# Patient Record
Sex: Male | Born: 1960 | Race: Black or African American | Hispanic: No | Smoking: Current every day smoker
Health system: Southern US, Community
[De-identification: ages and names within clinical notes are randomized; demographics above are authoritative.]

## PROBLEM LIST (undated history)

## (undated) DIAGNOSIS — K7460 Unspecified cirrhosis of liver: Secondary | ICD-10-CM

## (undated) DIAGNOSIS — B1920 Unspecified viral hepatitis C without hepatic coma: Secondary | ICD-10-CM

## (undated) DIAGNOSIS — R011 Cardiac murmur, unspecified: Secondary | ICD-10-CM

## (undated) DIAGNOSIS — F329 Major depressive disorder, single episode, unspecified: Secondary | ICD-10-CM

## (undated) DIAGNOSIS — E119 Type 2 diabetes mellitus without complications: Secondary | ICD-10-CM

## (undated) DIAGNOSIS — I10 Essential (primary) hypertension: Secondary | ICD-10-CM

## (undated) DIAGNOSIS — F32A Depression, unspecified: Secondary | ICD-10-CM

## (undated) HISTORY — DX: Depression, unspecified: F32.A

## (undated) HISTORY — DX: Major depressive disorder, single episode, unspecified: F32.9

---
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
## 2015-05-27 ENCOUNTER — Telehealth: Payer: Self-pay

## 2015-05-27 NOTE — Telephone Encounter (Signed)
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

## 2015-05-28 ENCOUNTER — Ambulatory Visit (INDEPENDENT_AMBULATORY_CARE_PROVIDER_SITE_OTHER): Payer: BLUE CROSS/BLUE SHIELD | Admitting: Internal Medicine

## 2015-05-28 ENCOUNTER — Encounter: Payer: Self-pay | Admitting: Internal Medicine

## 2015-05-28 VITALS — BP 140/86 | HR 52 | Temp 98.5°F | Ht 68.5 in | Wt 178.0 lb

## 2015-05-28 DIAGNOSIS — E119 Type 2 diabetes mellitus without complications: Secondary | ICD-10-CM | POA: Diagnosis not present

## 2015-05-28 DIAGNOSIS — I10 Essential (primary) hypertension: Secondary | ICD-10-CM | POA: Insufficient documentation

## 2015-05-28 DIAGNOSIS — E785 Hyperlipidemia, unspecified: Secondary | ICD-10-CM | POA: Diagnosis not present

## 2015-05-28 LAB — COMPREHENSIVE METABOLIC PANEL
ALT: 105 U/L — ABNORMAL HIGH (ref 0–53)
AST: 103 U/L — ABNORMAL HIGH (ref 0–37)
Albumin: 3.5 g/dL (ref 3.5–5.2)
Alkaline Phosphatase: 92 U/L (ref 39–117)
BUN: 13 mg/dL (ref 6–23)
CHLORIDE: 102 meq/L (ref 96–112)
CO2: 30 mEq/L (ref 19–32)
Calcium: 9.1 mg/dL (ref 8.4–10.5)
Creatinine, Ser: 0.94 mg/dL (ref 0.40–1.50)
GFR: 107.77 mL/min (ref >60.00)
Glucose, Bld: 78 mg/dL (ref 70–99)
Potassium: 4.3 mEq/L (ref 3.5–5.1)
SODIUM: 137 meq/L (ref 135–145)
Total Bilirubin: 1 mg/dL (ref 0.2–1.2)
Total Protein: 7.2 g/dL (ref 6.0–8.3)

## 2015-05-28 LAB — CBC
HCT: 44.2 % (ref 39.0–52.0)
Hemoglobin: 14.7 g/dL (ref 13.0–17.0)
MCHC: 33.2 g/dL (ref 30.0–36.0)
MCV: 92.4 fl (ref 78.0–100.0)
PLATELETS: 159 10*3/uL (ref 150.0–400.0)
RBC: 4.78 Mil/uL (ref 4.22–5.81)
RDW: 14.4 % (ref 11.5–15.5)
WBC: 4.1 10*3/uL (ref 4.0–10.5)

## 2015-05-28 LAB — LIPID PANEL
CHOL/HDL RATIO: 2
Cholesterol: 130 mg/dL (ref 0–200)
HDL: 54.1 mg/dL (ref >39.00)
LDL Cholesterol: 64 mg/dL (ref 0–99)
NonHDL: 75.9
Triglycerides: 60 mg/dL (ref 0.0–149.0)
VLDL: 12 mg/dL (ref 0.0–40.0)

## 2015-05-28 LAB — HEMOGLOBIN A1C: HEMOGLOBIN A1C: 6 % (ref 4.6–6.5)

## 2015-05-28 NOTE — Assessment & Plan Note (Addendum)
I think he is on Lisinopril more for renal protection secondary to DM2 Will continue to monitor BP at this time Will check CBC and CMET today Will get ECG at his next visit

## 2015-05-28 NOTE — Assessment & Plan Note (Signed)
Encouraged him to consume a low fat diet Will check CBC and CMET today Will continue Zocor at this time

## 2015-05-28 NOTE — Assessment & Plan Note (Signed)
Will check A1C today No microalbumin as he is on ACEI Continue Metformin and Januvia, if A1C < 6 consider cutting back Januvia Continue yearly eye exams Encouraged him to get a flu shot in the fall Will request records from previous PCP to see if he had a pneumonia vaccine

## 2015-05-28 NOTE — Patient Instructions (Signed)
Fat and Cholesterol Control Diet Fat and cholesterol levels in your blood and organs are influenced by your diet. High levels of fat and cholesterol may lead to diseases of the heart, small and large blood vessels, gallbladder, liver, and pancreas. CONTROLLING FAT AND CHOLESTEROL WITH DIET Although exercise and lifestyle factors are important, your diet is key. That is because certain foods are known to raise cholesterol and others to lower it. The goal is to balance foods for their effect on cholesterol and more importantly, to replace saturated and trans fat with other types of fat, such as monounsaturated fat, polyunsaturated fat, and omega-3 fatty acids. On average, a person should consume no more than 15 to 17 g of saturated fat daily. Saturated and trans fats are considered "bad" fats, and they will raise LDL cholesterol. Saturated fats are primarily found in animal products such as meats, butter, and cream. However, that does not mean you need to give up all your favorite foods. Today, there are good tasting, low-fat, low-cholesterol substitutes for most of the things you like to eat. Choose low-fat or nonfat alternatives. Choose round or loin cuts of red meat. These types of cuts are lowest in fat and cholesterol. Chicken (without the skin), fish, veal, and ground turkey breast are great choices. Eliminate fatty meats, such as hot dogs and salami. Even shellfish have little or no saturated fat. Have a 3 oz (85 g) portion when you eat lean meat, poultry, or fish. Trans fats are also called "partially hydrogenated oils." They are oils that have been scientifically manipulated so that they are solid at room temperature resulting in a longer shelf life and improved taste and texture of foods in which they are added. Trans fats are found in stick margarine, some tub margarines, cookies, crackers, and baked goods.  When baking and cooking, oils are a great substitute for butter. The monounsaturated oils are  especially beneficial since it is believed they lower LDL and raise HDL. The oils you should avoid entirely are saturated tropical oils, such as coconut and palm.  Remember to eat a lot from food groups that are naturally free of saturated and trans fat, including fish, fruit, vegetables, beans, grains (barley, rice, couscous, bulgur wheat), and pasta (without cream sauces).  IDENTIFYING FOODS THAT LOWER FAT AND CHOLESTEROL  Soluble fiber may lower your cholesterol. This type of fiber is found in fruits such as apples, vegetables such as broccoli, potatoes, and carrots, legumes such as beans, peas, and lentils, and grains such as barley. Foods fortified with plant sterols (phytosterol) may also lower cholesterol. You should eat at least 2 g per day of these foods for a cholesterol lowering effect.  Read package labels to identify low-saturated fats, trans fat free, and low-fat foods at the supermarket. Select cheeses that have only 2 to 3 g saturated fat per ounce. Use a heart-healthy tub margarine that is free of trans fats or partially hydrogenated oil. When buying baked goods (cookies, crackers), avoid partially hydrogenated oils. Breads and muffins should be made from whole grains (whole-wheat or whole oat flour, instead of "flour" or "enriched flour"). Buy non-creamy canned soups with reduced salt and no added fats.  FOOD PREPARATION TECHNIQUES  Never deep-fry. If you must fry, either stir-fry, which uses very little fat, or use non-stick cooking sprays. When possible, broil, bake, or roast meats, and steam vegetables. Instead of putting butter or margarine on vegetables, use lemon and herbs, applesauce, and cinnamon (for squash and sweet potatoes). Use nonfat   yogurt, salsa, and low-fat dressings for salads.  LOW-SATURATED FAT / LOW-FAT FOOD SUBSTITUTES Meats / Saturated Fat (g)  Avoid: Steak, marbled (3 oz/85 g) / 11 g  Choose: Steak, lean (3 oz/85 g) / 4 g  Avoid: Hamburger (3 oz/85 g) / 7  g  Choose: Hamburger, lean (3 oz/85 g) / 5 g  Avoid: Ham (3 oz/85 g) / 6 g  Choose: Ham, lean cut (3 oz/85 g) / 2.4 g  Avoid: Chicken, with skin, dark meat (3 oz/85 g) / 4 g  Choose: Chicken, skin removed, dark meat (3 oz/85 g) / 2 g  Avoid: Chicken, with skin, light meat (3 oz/85 g) / 2.5 g  Choose: Chicken, skin removed, light meat (3 oz/85 g) / 1 g Dairy / Saturated Fat (g)  Avoid: Whole milk (1 cup) / 5 g  Choose: Low-fat milk, 2% (1 cup) / 3 g  Choose: Low-fat milk, 1% (1 cup) / 1.5 g  Choose: Skim milk (1 cup) / 0.3 g  Avoid: Hard cheese (1 oz/28 g) / 6 g  Choose: Skim milk cheese (1 oz/28 g) / 2 to 3 g  Avoid: Cottage cheese, 4% fat (1 cup) / 6.5 g  Choose: Low-fat cottage cheese, 1% fat (1 cup) / 1.5 g  Avoid: Ice cream (1 cup) / 9 g  Choose: Sherbet (1 cup) / 2.5 g  Choose: Nonfat frozen yogurt (1 cup) / 0.3 g  Choose: Frozen fruit bar / trace  Avoid: Whipped cream (1 tbs) / 3.5 g  Choose: Nondairy whipped topping (1 tbs) / 1 g Condiments / Saturated Fat (g)  Avoid: Mayonnaise (1 tbs) / 2 g  Choose: Low-fat mayonnaise (1 tbs) / 1 g  Avoid: Butter (1 tbs) / 7 g  Choose: Extra light margarine (1 tbs) / 1 g  Avoid: Coconut oil (1 tbs) / 11.8 g  Choose: Olive oil (1 tbs) / 1.8 g  Choose: Corn oil (1 tbs) / 1.7 g  Choose: Safflower oil (1 tbs) / 1.2 g  Choose: Sunflower oil (1 tbs) / 1.4 g  Choose: Soybean oil (1 tbs) / 2.4 g  Choose: Canola oil (1 tbs) / 1 g Document Released: 10/26/2005 Document Revised: 02/20/2013 Document Reviewed: 01/24/2014 Exit[employer] Patient Information 2015 [employer]. This information is not intended to replace advice given to you by your health care provider. Make sure you discuss any questions you have with your health care provider.  

## 2015-05-28 NOTE — Progress Notes (Signed)
Pre visit review using our clinic review tool, if applicable. No additional management support is needed unless otherwise documented below in the visit note. 

## 2015-05-28 NOTE — Progress Notes (Signed)
HPI  Pt presents to the clinic today to establish care and for management of the conditions listed below. He is transferring care from Dr. Talmadge Coventry in [place].  DM2: He does test his sugars. They normally run around 100 fasting. He takes Metformin and Januvia as prescribed. He reports he was on insulin in the past, but was able to come off with diet and weight loss. His last eye exam was within the last year. His last flu shot was in 2014. He thinks he has had a pneumonia shot but is not positive.  HLD: He denies myalgias on Zocor. He does try to consume a low fat diet.  HTN: BP well controlled on Lisinopril. His BP today is 140/86.  Past Medical History  Diagnosis Date  . Diabetes mellitus without complication   . Hyperlipidemia     Current Outpatient Prescriptions  Medication Sig Dispense Refill  . lisinopril (PRINIVIL,ZESTRIL) 5 MG tablet Take 5 mg by mouth daily.    . metFORMIN (GLUCOPHAGE) 1000 MG tablet Take 1,000 mg by mouth 2 (two) times daily with a meal.    . simvastatin (ZOCOR) 40 MG tablet Take 40 mg by mouth daily.    . sitaGLIPtin (JANUVIA) 100 MG tablet Take 100 mg by mouth daily.     No current facility-administered medications for this visit.    No Known Allergies  Family History  Problem Relation Age of Onset  . Hyperlipidemia Mother   . Diabetes Mother   . Hyperlipidemia Father   . Diabetes Father     History   Social History  . Marital Status: Married    Spouse Name: N/A  . Number of Children: N/A  . Years of Education: N/A   Occupational History  . Not on file.   Social History Main Topics  . Smoking status: Current Every Day Smoker -- 0.50 packs/day    Types: Cigarettes  . Smokeless tobacco: Never Used  . Alcohol Use: 0.0 oz/week    0 Standard drinks or equivalent per week     Comment: weekend beer drinker  . Drug Use: No  . Sexual Activity: Not on file   Other Topics Concern  . Not on file   Social History Narrative  . No  narrative on file    ROS:  Constitutional: Denies fever, malaise, fatigue, headache or abrupt weight changes.  HEENT: Denies eye pain, eye redness, ear pain, ringing in the ears, wax buildup, runny nose, nasal congestion, bloody nose, or sore throat. Respiratory: Denies difficulty breathing, shortness of breath, cough or sputum production.   Cardiovascular: Denies chest pain, chest tightness, palpitations or swelling in the hands or feet.  Gastrointestinal: Denies abdominal pain, bloating, constipation, diarrhea or blood in the stool.  GU: Denies frequency, urgency, pain with urination, blood in urine, odor or discharge. Musculoskeletal: Denies decrease in range of motion, difficulty with gait, muscle pain or joint pain and swelling.  Skin: Denies redness, rashes, lesions or ulcercations.  Neurological: Denies dizziness, difficulty with memory, difficulty with speech or problems with balance and coordination.  Psych: Denies anxiety, depression, SI/HI.  No other specific complaints in a complete review of systems (except as listed in HPI above).  PE:  BP 140/86 mmHg  Pulse 52  Temp(Src) 98.5 F (36.9 C) (Oral)  Ht 5' 8.5" (1.74 m)  Wt 178 lb (80.74 kg)  BMI 26.67 kg/m2  SpO2 98% Wt Readings from Last 3 Encounters:  05/28/15 178 lb (80.74 kg)    General: Appears his  stated age, well developed, well nourished in NAD. HEENT: Head: normal shape and size; Eyes: sclera white, no icterus, conjunctiva pink, PERRLA and EOMs intact;  Neck: Neck supple, trachea midline. No masses, lumps or thyromegaly present.  Cardiovascular: Normal rate and rhythm. S1,S2 noted.  No murmur, rubs or gallops noted. No JVD or BLE edema. No carotid bruits noted. Pulmonary/Chest: Normal effort and positive vesicular breath sounds. No respiratory distress. No wheezes, rales or ronchi noted.  Abdomen: Soft and nontender. Normal bowel sounds, no bruits noted. No distention or masses noted. Liver, spleen and kidneys  non palpable. Neurological: Alert and oriented.  Psychiatric: Mood and affect normal. Behavior is normal. Judgment and thought content normal.     Assessment and Plan:

## 2015-05-30 ENCOUNTER — Telehealth: Payer: Self-pay | Admitting: Internal Medicine

## 2015-05-30 NOTE — Telephone Encounter (Signed)
Pt states someone called him but he doesn't know who.  He thinks maybe his labs are in now. Please call pt at home.  Thank you.

## 2015-05-31 NOTE — Addendum Note (Signed)
Addended by: Roena Malady on: 05/31/2015 09:40 AM   Modules accepted: Orders

## 2015-08-15 ENCOUNTER — Other Ambulatory Visit: Payer: Self-pay | Admitting: Internal Medicine

## 2015-09-21 LAB — HM DIABETES EYE EXAM

## 2015-10-17 ENCOUNTER — Encounter: Payer: Self-pay | Admitting: Internal Medicine

## 2015-12-12 ENCOUNTER — Ambulatory Visit: Payer: BLUE CROSS/BLUE SHIELD | Admitting: Internal Medicine

## 2015-12-13 ENCOUNTER — Telehealth: Payer: Self-pay | Admitting: Internal Medicine

## 2015-12-13 NOTE — Telephone Encounter (Signed)
Pt did not come in for their appt on 12/12/15 for follow up. Please let me know if pt needs to be contacted immediately for follow up or no follow up needed. Best phone number to contact pt is [phone].

## 2015-12-13 NOTE — Telephone Encounter (Signed)
Yes he needs 6 month followup

## 2015-12-16 NOTE — Telephone Encounter (Signed)
Left voicemail for pt to call back and reschedule appt.

## 2015-12-24 NOTE — Telephone Encounter (Signed)
Left message asking pt to call office  °

## 2015-12-25 ENCOUNTER — Encounter: Payer: Self-pay | Admitting: Internal Medicine

## 2015-12-25 NOTE — Telephone Encounter (Signed)
Left message asking pt to call and schedule appointment Also mailed letter

## 2016-08-14 ENCOUNTER — Emergency Department
Admission: EM | Admit: 2016-08-14 | Discharge: 2016-08-15 | Disposition: A | Discharge status: Home / Self Care | Payer: Self-pay | Attending: Emergency Medicine | Admitting: Emergency Medicine

## 2016-08-14 ENCOUNTER — Encounter: Payer: Self-pay | Admitting: Emergency Medicine

## 2016-08-14 ENCOUNTER — Telehealth: Payer: Self-pay | Admitting: Family Medicine

## 2016-08-14 ENCOUNTER — Encounter: Payer: Self-pay | Admitting: Internal Medicine

## 2016-08-14 ENCOUNTER — Ambulatory Visit (INDEPENDENT_AMBULATORY_CARE_PROVIDER_SITE_OTHER): Payer: Self-pay | Admitting: Internal Medicine

## 2016-08-14 ENCOUNTER — Telehealth: Payer: Self-pay | Admitting: *Deleted

## 2016-08-14 VITALS — BP 124/68 | HR 65 | Temp 98.6°F | Wt 177.0 lb

## 2016-08-14 DIAGNOSIS — E1165 Type 2 diabetes mellitus with hyperglycemia: Secondary | ICD-10-CM | POA: Insufficient documentation

## 2016-08-14 DIAGNOSIS — Z79899 Other long term (current) drug therapy: Secondary | ICD-10-CM | POA: Insufficient documentation

## 2016-08-14 DIAGNOSIS — E7800 Pure hypercholesterolemia, unspecified: Secondary | ICD-10-CM

## 2016-08-14 DIAGNOSIS — R739 Hyperglycemia, unspecified: Secondary | ICD-10-CM

## 2016-08-14 DIAGNOSIS — I10 Essential (primary) hypertension: Secondary | ICD-10-CM | POA: Insufficient documentation

## 2016-08-14 DIAGNOSIS — F17210 Nicotine dependence, cigarettes, uncomplicated: Secondary | ICD-10-CM | POA: Insufficient documentation

## 2016-08-14 DIAGNOSIS — E119 Type 2 diabetes mellitus without complications: Secondary | ICD-10-CM

## 2016-08-14 LAB — URINALYSIS COMPLETE WITH MICROSCOPIC (ARMC ONLY)
BILIRUBIN URINE: NEGATIVE
Bacteria, UA: NONE SEEN
Glucose, UA: >500 mg/dL — AB
Hgb urine dipstick: NEGATIVE
LEUKOCYTES UA: NEGATIVE
Nitrite: NEGATIVE
PH: 5 (ref 5.0–8.0)
PROTEIN: NEGATIVE mg/dL
RBC / HPF: NONE SEEN RBC/hpf (ref 0–5)
SPECIFIC GRAVITY, URINE: 1.03 (ref 1.005–1.030)
SQUAMOUS EPITHELIAL / LPF: NONE SEEN
WBC, UA: NONE SEEN WBC/hpf (ref 0–5)

## 2016-08-14 LAB — GLUCOSE, CAPILLARY
Glucose-Capillary: >600 mg/dL (ref 65–99)
Glucose-Capillary: 471 mg/dL — ABNORMAL HIGH (ref 65–99)
Glucose-Capillary: 368 mg/dL — ABNORMAL HIGH (ref 65–99)

## 2016-08-14 LAB — LIPID PANEL
CHOLESTEROL: 161 mg/dL (ref 0–200)
HDL: 19.7 mg/dL — AB (ref >39.00)
LDL Cholesterol: 102 mg/dL — ABNORMAL HIGH (ref 0–99)
NonHDL: 141.72
Total CHOL/HDL Ratio: 8
Triglycerides: 199 mg/dL — ABNORMAL HIGH (ref 0.0–149.0)
VLDL: 39.8 mg/dL (ref 0.0–40.0)

## 2016-08-14 LAB — BASIC METABOLIC PANEL
Anion gap: 10 (ref 5–15)
BUN: 11 mg/dL (ref 6–20)
CALCIUM: 9.2 mg/dL (ref 8.9–10.3)
CO2: 26 mmol/L (ref 22–32)
CREATININE: 1.06 mg/dL (ref 0.61–1.24)
Chloride: 90 mmol/L — ABNORMAL LOW (ref 101–111)
Glucose, Bld: 644 mg/dL (ref 65–99)
Potassium: 4.1 mmol/L (ref 3.5–5.1)
SODIUM: 126 mmol/L — AB (ref 135–145)

## 2016-08-14 LAB — HEMOGLOBIN A1C: HEMOGLOBIN A1C: 13.3 % — AB (ref 4.6–6.5)

## 2016-08-14 LAB — CBC
HCT: 44.8 % (ref 40.0–52.0)
Hemoglobin: 14.9 g/dL (ref 13.0–18.0)
MCH: 31 pg (ref 26.0–34.0)
MCHC: 33.2 g/dL (ref 32.0–36.0)
MCV: 93.6 fL (ref 80.0–100.0)
PLATELETS: 99 10*3/uL — AB (ref 150–440)
RBC: 4.79 MIL/uL (ref 4.40–5.90)
RDW: 13.4 % (ref 11.5–14.5)
WBC: 4.9 10*3/uL (ref 3.8–10.6)

## 2016-08-14 LAB — COMPREHENSIVE METABOLIC PANEL
ALBUMIN: 3 g/dL — AB (ref 3.5–5.2)
ALK PHOS: 245 U/L — AB (ref 39–117)
ALT: 130 U/L — AB (ref 0–53)
AST: 162 U/L — ABNORMAL HIGH (ref 0–37)
BILIRUBIN TOTAL: 1.1 mg/dL (ref 0.2–1.2)
BUN: 10 mg/dL (ref 6–23)
CO2: 23 mEq/L (ref 19–32)
Calcium: 9.2 mg/dL (ref 8.4–10.5)
Chloride: 89 mEq/L — ABNORMAL LOW (ref 96–112)
Creatinine, Ser: 1.05 mg/dL (ref 0.40–1.50)
GFR: 94.42 mL/min (ref >60.00)
Glucose, Bld: 773 mg/dL (ref 70–99)
POTASSIUM: 4 meq/L (ref 3.5–5.1)
Sodium: 124 mEq/L — ABNORMAL LOW (ref 135–145)
TOTAL PROTEIN: 8.1 g/dL (ref 6.0–8.3)

## 2016-08-14 MED ORDER — INSULIN ASPART 100 UNIT/ML ~~LOC~~ SOLN
10.0000 [IU] | Freq: Once | SUBCUTANEOUS | Status: AC
Start: 2016-08-14 — End: 2016-08-14
  Administered 2016-08-14: 10 [IU] via INTRAVENOUS
  Filled 2016-08-14: qty 10

## 2016-08-14 MED ORDER — SODIUM CHLORIDE 0.9 % IV BOLUS (SEPSIS)
1000.0000 mL | Freq: Once | INTRAVENOUS | Status: AC
  Administered 2016-08-14: 1000 mL via INTRAVENOUS

## 2016-08-14 MED ORDER — POTASSIUM CHLORIDE CRYS ER 20 MEQ PO TBCR
40.0000 meq | EXTENDED_RELEASE_TABLET | Freq: Once | ORAL | Status: AC
  Administered 2016-08-14: 40 meq via ORAL
  Filled 2016-08-14: qty 2

## 2016-08-14 NOTE — Telephone Encounter (Signed)
Mr. Cameron Coffey notified as instructed by telephone.  He states he has to wait until his finance gets home which should be in the next hour but he states he will go the [facility] ED.

## 2016-08-14 NOTE — Telephone Encounter (Signed)
Hope from [facility] called with critical lab. Glucose 773 when drawn today at 2pm. Results are in EPIC. Result given to Dr. Ermalene SearingBedsole since Nicki ReaperRegina Baity is not in the office.

## 2016-08-14 NOTE — Telephone Encounter (Signed)
Notify pt that his sugar is dangerously high at 773.Marland Kitchen. He needs to go to the ER to be treated ASAP

## 2016-08-14 NOTE — Progress Notes (Signed)
HPI  Pt presents to the clinic today for follow up of chronic conditions.  DM2: His last A1C was 6.0%. His sugars have run as high as 512. He takes Metformin but reports he can not afford his Januvia. He reports he was on insulin in the past, but was able to come off with diet and weight loss. His last eye exam was within the last year. His last flu shot was in 2014. He is not sure if he has had his pneumovax or not. Eye exam 05/2015  HLD: His last LDL was 68. He denies myalgias on Zocor. He does try to consume a low fat diet.  HTN: BP well controlled on Lisinopril. His BP today is 124/68. There is no ECG on file.  Past Medical History:  Diagnosis Date  . Diabetes mellitus without complication   . Hyperlipidemia     Current Outpatient Prescriptions  Medication Sig Dispense Refill  . lisinopril (PRINIVIL,ZESTRIL) 5 MG tablet Take 5 mg by mouth daily.    . metFORMIN (GLUCOPHAGE) 1000 MG tablet Take 1 tablet (1,000 mg total) by mouth 2 (two) times daily. MUST SCHEDULE LAB ONLY APPOINTMENT IT IS DUE NOW 180 tablet 0  . simvastatin (ZOCOR) 40 MG tablet Take 40 mg by mouth daily.    . sitaGLIPtin (JANUVIA) 100 MG tablet Take 100 mg by mouth daily.     No current facility-administered medications for this visit.     No Known Allergies  Family History  Problem Relation Age of Onset  . Hyperlipidemia Mother   . Diabetes Mother   . Hyperlipidemia Father   . Diabetes Father     Social History   Social History  . Marital status: Married    Spouse name: N/A  . Number of children: N/A  . Years of education: N/A   Occupational History  . Not on file.   Social History Main Topics  . Smoking status: Current Every Day Smoker    Packs/day: 0.50    Years: 10.00    Types: Cigarettes  . Smokeless tobacco: Never Used  . Alcohol use 0.0 oz/week     Comment: weekend beer drinker  . Drug use: No  . Sexual activity: No   Other Topics Concern  . Not on file   Social History Narrative   . No narrative on file    ROS:  Constitutional: Pt reports fatigue and weight loss. Denies fever, malaise, headache.  Respiratory: Denies difficulty breathing, shortness of breath, cough or sputum production.   Cardiovascular: Denies chest pain, chest tightness, palpitations or swelling in the hands or feet.  Gastrointestinal: Pt reports increased thirst. Denies abdominal pain, bloating, constipation, diarrhea or blood in the stool.  GU: Pt reports urinary frequency. Denies, urgency, pain with urination, blood in urine, odor or discharge. Skin: Denies redness, rashes, lesions or ulcercations.  Neurological: Denies dizziness, difficulty with memory, difficulty with speech or problems with balance and coordination.  Psych: Denies anxiety, depression, SI/HI.  No other specific complaints in a complete review of systems (except as listed in HPI above).  PE:  BP 124/68 (BP Location: Left Arm, Patient Position: Sitting, Cuff Size: Normal)   Pulse 65   Temp 98.6 F (37 C) (Oral)   Wt 177 lb (80.3 kg)   SpO2 98%   BMI 26.52 kg/m   Wt Readings from Last 3 Encounters:  05/28/15 178 lb (80.7 kg)    General: Appears his stated age, well developed, well nourished in NAD. Cardiovascular: Normal rate  and rhythm. S1,S2 noted. Murmur noted.  No JVD or BLE edema. No carotid bruits noted. Pulmonary/Chest: Normal effort and positive vesicular breath sounds. No respiratory distress. No wheezes, rales or ronchi noted.  Abdomen: Soft and nontender.  Neurological: Alert and oriented. Sensation intact to BLE. Psychiatric: Mood and affect normal. Behavior is normal. Judgment and thought content normal.     Assessment and Plan:

## 2016-08-14 NOTE — Patient Instructions (Signed)
Diabetes and Standards of Medical Care Diabetes is complicated. You may find that your diabetes team includes a dietitian, nurse, diabetes educator, eye doctor, and more. To help everyone know what is going on and to help you get the care you deserve, the following schedule of care was developed to help keep you on track. Below are the tests, exams, vaccines, medicines, education, and plans you will need. HbA1c test This test shows how well you have controlled your glucose over the past 2-3 months. It is used to see if your diabetes management plan needs to be adjusted.   It is performed at least 2 times a year if you are meeting treatment goals.  It is performed 4 times a year if therapy has changed or if you are not meeting treatment goals. Blood pressure test  This test is performed at every routine medical visit. The goal is less than 140/90 mm Hg for most people, but 130/80 mm Hg in some cases. Ask your health care provider about your goal. Dental exam  Follow up with the dentist regularly. Eye exam  If you are diagnosed with type 1 diabetes as a child, get an exam upon reaching the age of 80 years or older and having had diabetes for 3-5 years. Yearly eye exams are recommended after that initial eye exam.  If you are diagnosed with type 1 diabetes as an adult, get an exam within 5 years of diagnosis and then yearly.  If you are diagnosed with type 2 diabetes, get an exam as soon as possible after the diagnosis and then yearly. Foot care exam  Visual foot exams are performed at every routine medical visit. The exams check for cuts, injuries, or other problems with the feet.  You should have a complete foot exam performed every year. This exam includes an inspection of the structure and skin of your feet, a check of the pulses in your feet, and a check of the sensation in your feet.  Type 1 diabetes: The first exam is performed 5 years after diagnosis.  Type 2 diabetes: The first  exam is performed at the time of diagnosis.  Check your feet nightly for cuts, injuries, or other problems with your feet. Tell your health care provider if anything is not healing. Kidney function test (urine microalbumin)  This test is performed once a year.  Type 1 diabetes: The first test is performed 5 years after diagnosis.  Type 2 diabetes: The first test is performed at the time of diagnosis.  A serum creatinine and estimated glomerular filtration rate (eGFR) test is done once a year to assess the level of chronic kidney disease (CKD), if present. Lipid profile (cholesterol, HDL, LDL, triglycerides)  Performed every 5 years for most people.  The goal for LDL is less than 100 mg/dL. If you are at high risk, the goal is less than 70 mg/dL.  The goal for HDL is 40 mg/dL-50 mg/dL for men and 50 mg/dL-60 mg/dL for women. An HDL cholesterol of 60 mg/dL or higher gives some protection against heart disease.  The goal for triglycerides is less than 150 mg/dL. Immunizations  The flu (influenza) vaccine is recommended yearly for every person 30 months of age or older who has diabetes.  The pneumonia (pneumococcal) vaccine is recommended for every person 38 years of age or older who has diabetes. Adults 57 years of age or older may receive the pneumonia vaccine as a series of two separate shots.  The hepatitis B  vaccine is recommended for adults shortly after they have been diagnosed with diabetes.  The Tdap (tetanus, diphtheria, and pertussis) vaccine should be given:  According to normal childhood vaccination schedules, for children.  Every 10 years, for adults who have diabetes. Diabetes self-management education  Education is recommended at diagnosis and ongoing as needed. Treatment plan  Your treatment plan is reviewed at every medical visit.   This information is not intended to replace advice given to you by your health care provider. Make sure you discuss any questions you  have with your health care provider.   Document Released: 08/23/2009 Document Revised: 11/16/2014 Document Reviewed: 03/28/2013 Elsevier Interactive Patient Education 2016 [employer]  

## 2016-08-14 NOTE — ED Triage Notes (Signed)
Pt presents to ED from [facility] c/o elevated blood sugar level 773. On arrival pt is alert and oriented x4 with BS on arrival >600. Alert and oriented x4

## 2016-08-14 NOTE — Telephone Encounter (Signed)
See phone note

## 2016-08-15 MED ORDER — INSULIN ASPART 100 UNIT/ML ~~LOC~~ SOLN
SUBCUTANEOUS | 0 refills | Status: DC
Start: 2016-08-15 — End: 2016-08-17

## 2016-08-15 NOTE — ED Provider Notes (Signed)
[facility] Emergency Department Provider Note   ____________________________________________   First MD Initiated Contact with Patient 08/15/16 0006     (approximate)  I have reviewed the triage vital signs and the nursing notes.   HISTORY  Chief Complaint Hyperglycemia and Abnormal Lab    HPI Cameron Coffey is a 55 y.o. male sent to the ED from home by his PCP for critically high blood sugar. Patient is a type II diabetic, currently on metformin only. He lost his insurance 2 months ago and has not been able to afford Januvia which he should also be taking. He has been insulin-dependent in the past but was able to lose weight and come off insulin 4 years ago.States 3 weeks ago he worked in extreme heat and has been feeling bad ever since. He saw his doctor 2 days ago for regular checkup. He has had symptoms of polydipsia and polyuria as well as generalized fatigue. Had blood work done and he was called his PCP yesterday evening to come to the ER because his blood sugar was 773. Patient denies recent fever, chills, chest pain, shortness of breath, abdominal pain, nausea, vomiting, diarrhea. Denies recent travel or trauma. Nothing makes his symptoms better or worse.   Past Medical History:  Diagnosis Date  . Diabetes mellitus without complication (HCC)   . Hyperlipidemia     Patient Active Problem List   Diagnosis Date Noted  . Type 2 diabetes mellitus without complication (HCC) 05/28/2015  . HLD (hyperlipidemia) 05/28/2015  . Essential hypertension 05/28/2015    History reviewed. No pertinent surgical history.  Prior to Admission medications   Medication Sig Start Date End Date Taking? Authorizing Provider  lisinopril (PRINIVIL,ZESTRIL) 5 MG tablet Take 5 mg by mouth daily.   Yes Historical Provider, MD  metFORMIN (GLUCOPHAGE) 1000 MG tablet Take 1 tablet (1,000 mg total) by mouth 2 (two) times daily. MUST SCHEDULE LAB ONLY APPOINTMENT IT IS DUE NOW  08/16/15  Yes Lorre Munroe, NP  simvastatin (ZOCOR) 40 MG tablet Take 40 mg by mouth daily.   Yes Historical Provider, MD  insulin aspart (NOVOLOG) 100 UNIT/ML injection Use according to sliding scale instructions 08/15/16   Irean Hong, MD    Allergies Review of patient's allergies indicates no known allergies.  Family History  Problem Relation Age of Onset  . Hyperlipidemia Mother   . Diabetes Mother   . Hyperlipidemia Father   . Diabetes Father     Social History Social History  Substance Use Topics  . Smoking status: Current Every Day Smoker    Packs/day: 0.50    Years: 10.00    Types: Cigarettes  . Smokeless tobacco: Never Used  . Alcohol use 0.0 oz/week     Comment: weekend beer drinker    Review of Systems  Constitutional: Positive for polydipsia and polyuria. Positive for generalized malaise. No fever/chills. Eyes: No visual changes. ENT: No sore throat. Cardiovascular: Denies chest pain. Respiratory: Denies shortness of breath. Gastrointestinal: No abdominal pain.  No nausea, no vomiting.  No diarrhea.  No constipation. Genitourinary: Negative for dysuria. Musculoskeletal: Negative for back pain. Skin: Negative for rash. Neurological: Negative for headaches, focal weakness or numbness.  10-point ROS otherwise negative.  ____________________________________________   PHYSICAL EXAM:  VITAL SIGNS: ED Triage Vitals  Enc Vitals Group     BP 08/14/16 1818 139/81     Pulse Rate 08/14/16 1818 61     Resp 08/14/16 1818 16     Temp  08/14/16 1818 98.1 F (36.7 C)     Temp src --      SpO2 08/14/16 1818 100 %     Weight 08/14/16 1820 177 lb (80.3 kg)     Height 08/14/16 1820 5\' 9"  (1.753 m)     Head Circumference --      Peak Flow --      Pain Score --      Pain Loc --      Pain Edu? --      Excl. in GC? --     Constitutional: Alert and oriented. Well appearing and in no acute distress. Eyes: Conjunctivae are normal. PERRL. EOMI. Head:  Atraumatic. Nose: No congestion/rhinnorhea. Mouth/Throat: Mucous membranes are moist.  Oropharynx non-erythematous. Neck: No stridor.   Cardiovascular: Normal rate, regular rhythm. Grossly normal heart sounds.  Good peripheral circulation. Respiratory: Normal respiratory effort.  No retractions. Lungs CTAB. Gastrointestinal: Soft and nontender. No distention. No abdominal bruits. No CVA tenderness. Musculoskeletal: No lower extremity tenderness nor edema.  No joint effusions. Neurologic:  Normal speech and language. No gross focal neurologic deficits are appreciated. No gait instability. Skin:  Skin is warm, dry and intact. No rash noted. Psychiatric: Mood and affect are normal. Speech and behavior are normal.  ____________________________________________   LABS (all labs ordered are listed, but only abnormal results are displayed)  Labs Reviewed  GLUCOSE, CAPILLARY - Abnormal; Notable for the following:       Result Value   Glucose-Capillary >600 (*)    All other components within normal limits  BASIC METABOLIC PANEL - Abnormal; Notable for the following:    Sodium 126 (*)    Chloride 90 (*)    Glucose, Bld 644 (*)    All other components within normal limits  CBC - Abnormal; Notable for the following:    Platelets 99 (*)    All other components within normal limits  URINALYSIS COMPLETEWITH MICROSCOPIC ([facility] ONLY) - Abnormal; Notable for the following:    Color, Urine STRAW (*)    APPearance CLEAR (*)    Glucose, UA >500 (*)    Ketones, ur TRACE (*)    All other components within normal limits  GLUCOSE, CAPILLARY - Abnormal; Notable for the following:    Glucose-Capillary 471 (*)    All other components within normal limits  GLUCOSE, CAPILLARY - Abnormal; Notable for the following:    Glucose-Capillary 368 (*)    All other components within normal limits  CBG MONITORING, ED    ____________________________________________  EKG  None ____________________________________________  RADIOLOGY  None ____________________________________________   PROCEDURES  Procedure(s) performed: None  Procedures  Critical Care performed: No  ____________________________________________   INITIAL IMPRESSION / ASSESSMENT AND PLAN / ED COURSE  Pertinent labs & imaging results that were available during my care of the patient were reviewed by me and considered in my medical decision making (see chart for details).  55 year old male who is a type 2 diabetic sent to the ED by his PCP for abnormally elevated blood sugar done on blood work yesterday. Patient was treated prior to my arrival with 2 L of normal saline, insulin as well as potassium. Blood sugar currently is 368. Patient feels fine and is eager for discharge home. Given that it is the weekend, I have provided patient an instruction sheet for sliding scale insulin until he sees his doctor on Monday. Patient has a glucometer at home. He is financially strapped; we will provide patient with insulin and supplies to last  the weekend. Return precautions given. Patient and spouse verbalize understanding and agree with plan of care.  Clinical Course     ____________________________________________   FINAL CLINICAL IMPRESSION(S) / ED DIAGNOSES  Final diagnoses:  Hyperglycemia  Type 2 diabetes mellitus with hyperglycemia, without long-term current use of insulin (HCC)      NEW MEDICATIONS STARTED DURING THIS VISIT:  New Prescriptions   INSULIN ASPART (NOVOLOG) 100 UNIT/ML INJECTION    Use according to sliding scale instructions     Note:  This document was prepared using Dragon voice recognition software and may include unintentional dictation errors.    Irean HongJade J Brecklynn Jian, MD 08/15/16 0530

## 2016-08-15 NOTE — ED Notes (Signed)
Pt. Asking to go home. 

## 2016-08-15 NOTE — Discharge Instructions (Signed)
1. Continue metformin twice daily as directed by your doctor. 2. Check your blood sugar before meals and give insulin according to the sliding scale chart provided. 3. Return to the ER for worsening symptoms, persistent vomiting, difficulty breathing or other concerns.

## 2016-08-15 NOTE — ED Notes (Signed)
Pt. Requested IV removal and monitoring devices.  IV removed and leads removed.  Pt. Waiting for insulin from pharmacy.

## 2016-08-15 NOTE — ED Notes (Signed)
Pt. Going home with family. 

## 2016-08-15 NOTE — ED Notes (Signed)
Pt. Given instructions on how to give insulin injections.  Pt. States using insulin in the past.

## 2016-08-16 NOTE — Assessment & Plan Note (Signed)
Will check A1C today Continue Metformin Patient assistance form for Januvia completed No microalbumin secondary to ACEI Foot exam today He declines flu or pneumonia shot today  Will change therapy based on A1C

## 2016-08-16 NOTE — Assessment & Plan Note (Signed)
Controlled on Lisinopril CMET today 

## 2016-08-16 NOTE — Assessment & Plan Note (Signed)
Encouraged him to consume a low fat diet Continue Zocor Lipid Profile today

## 2016-08-17 MED ORDER — METFORMIN HCL 1000 MG PO TABS: 1000.0000 mg | ORAL_TABLET | Freq: Two times a day (BID) | ORAL | 2 refills | Status: DC

## 2016-08-17 MED ORDER — GLIPIZIDE 10 MG PO TABS: 10.0000 mg | ORAL_TABLET | Freq: Two times a day (BID) | ORAL | 2 refills | Status: DC

## 2016-08-17 NOTE — Addendum Note (Signed)
Addended by: Roena MaladyEVONTENNO, Tyreek Clabo Y on: 08/17/2016 05:27 PM   Modules accepted: Orders

## 2016-12-02 ENCOUNTER — Other Ambulatory Visit: Payer: Self-pay

## 2016-12-02 MED ORDER — METFORMIN HCL 1000 MG PO TABS: 1000.0000 mg | ORAL_TABLET | Freq: Two times a day (BID) | ORAL | 1 refills | Status: DC

## 2016-12-02 MED ORDER — GLIPIZIDE 10 MG PO TABS: 10.0000 mg | ORAL_TABLET | Freq: Two times a day (BID) | ORAL | 1 refills | Status: DC

## 2016-12-07 ENCOUNTER — Ambulatory Visit: Payer: Self-pay | Admitting: Internal Medicine

## 2016-12-07 NOTE — Progress Notes (Deleted)
   Subjective:    Patient ID: Cameron Coffey, male    DOB: 10/12/1961, 56 y.o.   MRN: [id number]  HPI  Pt presents to the clinic today for 3 month follow up of HLD and DM2. His last A1C was 13.3%, 08/2016. He had been noncompliant with diet and exercise. He is taking Metformin and Glipizide as prescribed. He reports at his last visit, that he could not afford Januvia. He is on Lisinopril for renal protection. His last LDL was 102, triglycerides 199, 08/2016. He is taking Zocor as prescribed. He denies myalgias. He has been consuming a low fat diet.  He is also here for a mole removal.  Review of Systems  Past Medical History:  Diagnosis Date  . Diabetes mellitus without complication (HCC)   . Hyperlipidemia     Current Outpatient Prescriptions  Medication Sig Dispense Refill  . glipiZIDE (GLUCOTROL) 10 MG tablet Take 1 tablet (10 mg total) by mouth 2 (two) times daily before a meal. 60 tablet 1  . lisinopril (PRINIVIL,ZESTRIL) 5 MG tablet Take 5 mg by mouth daily.    . metFORMIN (GLUCOPHAGE) 1000 MG tablet Take 1 tablet (1,000 mg total) by mouth 2 (two) times daily. 60 tablet 1  . simvastatin (ZOCOR) 40 MG tablet Take 40 mg by mouth daily.     No current facility-administered medications for this visit.     No Known Allergies  Family History  Problem Relation Age of Onset  . Hyperlipidemia Mother   . Diabetes Mother   . Hyperlipidemia Father   . Diabetes Father     Social History   Social History  . Marital status: Divorced    Spouse name: N/A  . Number of children: N/A  . Years of education: N/A   Occupational History  . Not on file.   Social History Main Topics  . Smoking status: Current Every Day Smoker    Packs/day: 0.50    Years: 10.00    Types: Cigarettes  . Smokeless tobacco: Never Used  . Alcohol use 0.0 oz/week     Comment: weekend beer drinker  . Drug use: No  . Sexual activity: No   Other Topics Concern  . Not on file   Social History Narrative   . No narrative on file     Constitutional: Denies fever, malaise, fatigue, headache or abrupt weight changes.  HEENT: Denies eye pain, eye redness, ear pain, ringing in the ears, wax buildup, runny nose, nasal congestion, bloody nose, or sore throat. Respiratory: Denies difficulty breathing, shortness of breath, cough or sputum production.   Cardiovascular: Denies chest pain, chest tightness, palpitations or swelling in the hands or feet.  Gastrointestinal: Denies abdominal pain, bloating, constipation, diarrhea or blood in the stool.  GU: Denies urgency, frequency, pain with urination, burning sensation, blood in urine, odor or discharge. Musculoskeletal: Denies decrease in range of motion, difficulty with gait, muscle pain or joint pain and swelling.  Skin: Denies redness, rashes, lesions or ulcercations.  Neurological: Denies dizziness, difficulty with memory, difficulty with speech or problems with balance and coordination.  Psych: Denies anxiety, depression, SI/HI.  No other specific complaints in a complete review of systems (except as listed in HPI above).     Objective:   Physical Exam        Assessment & Plan:

## 2016-12-23 ENCOUNTER — Other Ambulatory Visit: Payer: Self-pay

## 2016-12-23 MED ORDER — GLIPIZIDE 10 MG PO TABS: 10.0000 mg | ORAL_TABLET | Freq: Two times a day (BID) | ORAL | 0 refills | Status: DC

## 2016-12-23 MED ORDER — METFORMIN HCL 1000 MG PO TABS: 1000.0000 mg | ORAL_TABLET | Freq: Two times a day (BID) | ORAL | 0 refills | Status: DC

## 2017-01-27 ENCOUNTER — Other Ambulatory Visit: Payer: Self-pay

## 2017-01-27 MED ORDER — SIMVASTATIN 40 MG PO TABS: 40.0000 mg | ORAL_TABLET | Freq: Every day | ORAL | 0 refills | Status: DC

## 2017-01-27 MED ORDER — LISINOPRIL 5 MG PO TABS: 5.0000 mg | ORAL_TABLET | Freq: Every day | ORAL | 0 refills | Status: DC

## 2017-01-27 MED ORDER — METFORMIN HCL 1000 MG PO TABS: 1000.0000 mg | ORAL_TABLET | Freq: Two times a day (BID) | ORAL | 0 refills | Status: DC

## 2017-01-27 MED ORDER — GLIPIZIDE 10 MG PO TABS: 10.0000 mg | ORAL_TABLET | Freq: Two times a day (BID) | ORAL | 0 refills | Status: DC

## 2017-02-14 ENCOUNTER — Other Ambulatory Visit: Payer: Self-pay | Admitting: Internal Medicine

## 2017-02-15 ENCOUNTER — Ambulatory Visit: Payer: Self-pay | Admitting: Internal Medicine

## 2017-07-15 ENCOUNTER — Emergency Department: Payer: Self-pay

## 2017-07-15 ENCOUNTER — Other Ambulatory Visit: Payer: Self-pay

## 2017-07-15 ENCOUNTER — Emergency Department
Admission: EM | Admit: 2017-07-15 | Discharge: 2017-07-15 | Disposition: A | Discharge status: Home / Self Care | Payer: Self-pay | Attending: Student in an Organized Health Care Education/Training Program | Admitting: Student in an Organized Health Care Education/Training Program

## 2017-07-15 ENCOUNTER — Encounter: Payer: Self-pay | Admitting: Emergency Medicine

## 2017-07-15 DIAGNOSIS — I10 Essential (primary) hypertension: Secondary | ICD-10-CM | POA: Insufficient documentation

## 2017-07-15 DIAGNOSIS — Z7984 Long term (current) use of oral hypoglycemic drugs: Secondary | ICD-10-CM | POA: Insufficient documentation

## 2017-07-15 DIAGNOSIS — F17210 Nicotine dependence, cigarettes, uncomplicated: Secondary | ICD-10-CM | POA: Insufficient documentation

## 2017-07-15 DIAGNOSIS — Z79899 Other long term (current) drug therapy: Secondary | ICD-10-CM | POA: Insufficient documentation

## 2017-07-15 DIAGNOSIS — E1165 Type 2 diabetes mellitus with hyperglycemia: Secondary | ICD-10-CM | POA: Insufficient documentation

## 2017-07-15 DIAGNOSIS — E860 Dehydration: Secondary | ICD-10-CM | POA: Insufficient documentation

## 2017-07-15 DIAGNOSIS — R739 Hyperglycemia, unspecified: Secondary | ICD-10-CM

## 2017-07-15 LAB — COMPREHENSIVE METABOLIC PANEL
ALT: 126 U/L — AB (ref 17–63)
ANION GAP: 13 (ref 5–15)
AST: 179 U/L — ABNORMAL HIGH (ref 15–41)
Albumin: 2.8 g/dL — ABNORMAL LOW (ref 3.5–5.0)
Alkaline Phosphatase: 137 U/L — ABNORMAL HIGH (ref 38–126)
BUN: 13 mg/dL (ref 6–20)
CHLORIDE: 83 mmol/L — AB (ref 101–111)
CO2: 24 mmol/L (ref 22–32)
CREATININE: 0.94 mg/dL (ref 0.61–1.24)
Calcium: 9.1 mg/dL (ref 8.9–10.3)
Glucose, Bld: 802 mg/dL (ref 65–99)
Potassium: 5.1 mmol/L (ref 3.5–5.1)
Sodium: 120 mmol/L — ABNORMAL LOW (ref 135–145)
Total Bilirubin: 2.1 mg/dL — ABNORMAL HIGH (ref 0.3–1.2)
Total Protein: 8.5 g/dL — ABNORMAL HIGH (ref 6.5–8.1)

## 2017-07-15 LAB — BLOOD GAS, VENOUS
ACID-BASE EXCESS: 2 mmol/L (ref 0.0–2.0)
BICARBONATE: 26.5 mmol/L (ref 20.0–28.0)
O2 Saturation: 93.6 %
PATIENT TEMPERATURE: 37
PH VEN: 7.43 (ref 7.250–7.430)
pCO2, Ven: 40 mmHg — ABNORMAL LOW (ref 44.0–60.0)
pO2, Ven: 67 mmHg — ABNORMAL HIGH (ref 32.0–45.0)

## 2017-07-15 LAB — CBC WITH DIFFERENTIAL/PLATELET
Band Neutrophils: 0 %
Basophils Absolute: 0 10*3/uL (ref 0–0.1)
Basophils Relative: 0 %
Blasts: 0 %
EOS ABS: 0 10*3/uL (ref 0–0.7)
EOS PCT: 0 %
HCT: 46.8 % (ref 40.0–52.0)
HEMOGLOBIN: 15.5 g/dL (ref 13.0–18.0)
LYMPHS ABS: 1.1 10*3/uL (ref 1.0–3.6)
Lymphocytes Relative: 15 %
MCH: 31.1 pg (ref 26.0–34.0)
MCHC: 33.2 g/dL (ref 32.0–36.0)
MCV: 93.9 fL (ref 80.0–100.0)
MONO ABS: 1 10*3/uL (ref 0.2–1.0)
MYELOCYTES: 0 %
Metamyelocytes Relative: 0 %
Monocytes Relative: 14 %
NEUTROS PCT: 71 %
NRBC: 0 /100{WBCs}
Neutro Abs: 4.9 10*3/uL (ref 1.4–6.5)
Other: 0 %
PROMYELOCYTES ABS: 0 %
Platelets: 101 10*3/uL — ABNORMAL LOW (ref 150–440)
RBC: 4.99 MIL/uL (ref 4.40–5.90)
RDW: 13.9 % (ref 11.5–14.5)
Smear Review: ADEQUATE
WBC: 7 10*3/uL (ref 3.8–10.6)

## 2017-07-15 LAB — URINALYSIS, COMPLETE (UACMP) WITH MICROSCOPIC
BACTERIA UA: NONE SEEN
Bilirubin Urine: NEGATIVE
Glucose, UA: >=500 mg/dL — AB
Ketones, ur: 20 mg/dL — AB
NITRITE: NEGATIVE
PH: 6 (ref 5.0–8.0)
Protein, ur: NEGATIVE mg/dL
SPECIFIC GRAVITY, URINE: 1.028 (ref 1.005–1.030)

## 2017-07-15 LAB — GLUCOSE, CAPILLARY
GLUCOSE-CAPILLARY: 568 mg/dL — AB (ref 65–99)
GLUCOSE-CAPILLARY: 463 mg/dL — AB (ref 65–99)

## 2017-07-15 LAB — OSMOLALITY: OSMOLALITY: 304 mosm/kg — AB (ref 275–295)

## 2017-07-15 MED ORDER — INSULIN ASPART 100 UNIT/ML ~~LOC~~ SOLN
10.0000 [IU] | Freq: Once | SUBCUTANEOUS | Status: AC
  Administered 2017-07-15: 10 [IU] via INTRAVENOUS
  Filled 2017-07-15: qty 1

## 2017-07-15 MED ORDER — GLIPIZIDE 10 MG PO TABS: 10.0000 mg | ORAL_TABLET | Freq: Two times a day (BID) | ORAL | 0 refills | Status: DC

## 2017-07-15 MED ORDER — METFORMIN HCL 1000 MG PO TABS
1000.0000 mg | ORAL_TABLET | Freq: Two times a day (BID) | ORAL | 0 refills | Status: DC
Start: 2017-07-15 — End: 2017-08-18

## 2017-07-15 MED ORDER — SODIUM CHLORIDE 0.9 % IV BOLUS (SEPSIS)
1000.0000 mL | Freq: Once | INTRAVENOUS | Status: AC
  Administered 2017-07-15: 1000 mL via INTRAVENOUS

## 2017-07-15 MED ORDER — INSULIN DETEMIR 100 UNIT/ML ~~LOC~~ SOLN
15.0000 [IU] | Freq: Every day | SUBCUTANEOUS | Status: DC
  Administered 2017-07-15: 15 [IU] via SUBCUTANEOUS
  Filled 2017-07-15 (×2): qty 0.15

## 2017-07-15 NOTE — Care Management Note (Signed)
Case Management Note  Patient Details  Name: Cameron Coffey MRN: [id number] Date of Birth: 07/01/1961  Subjective/Objective:  Saw pt at bedside and spoke to him after getting permission to talk in front of family in room. The patient says he has no way to get the medication for his DM and has never been referred to Medication management Clinic . I [facility] , . The second was provided because the pt. And his family have questions about how he will be able to afford and pay for his care here in hospital.    At this time they have no further questions . The nurse for the pt. And the MD are made aware.              Action/Plan:   Expected Discharge Date:                  Expected Discharge Plan:     In-House Referral:     Discharge planning Services     Post Acute Care Choice:    Choice offered to:     DME Arranged:    DME Agency:     HH Arranged:    HH Agency:     Status of Service:     If discussed at [employer] Length of Stay Meetings, dates discussed:    Additional Comments:  Berna BueCheryl Auriana Scalia, RN 07/15/2017, 2:27 PM

## 2017-07-15 NOTE — ED Provider Notes (Signed)
[facility] Emergency Department Provider Note    First MD Initiated Contact with Patient 07/15/17 1402     (approximate)  I have reviewed the triage vital signs and the nursing notes.   HISTORY  Chief Complaint Weight Loss    HPI Cameron Coffey is a 56 y.o. male with a history of diabetes also with history of being noncompliant with his hyper extremity medications presents with roughly 30-40 pound weight loss over the past 2 months associated with polyuria and polydipsia. States is also feeling generalized fatigue. No fevers. No abdominal pain. No dysuria. No diarrhea. No shortness of breath. States he ran out of his medications roughly 2 months ago has not followed up with his PCP.   Past Medical History:  Diagnosis Date  . Diabetes mellitus without complication (HCC)   . Hyperlipidemia    Family History  Problem Relation Age of Onset  . Hyperlipidemia Mother   . Diabetes Mother   . Hyperlipidemia Father   . Diabetes Father    History reviewed. No pertinent surgical history. Patient Active Problem List   Diagnosis Date Noted  . Type 2 diabetes mellitus without complication (HCC) 05/28/2015  . HLD (hyperlipidemia) 05/28/2015  . Essential hypertension 05/28/2015      Prior to Admission medications   Medication Sig Start Date End Date Taking? Authorizing Provider  glipiZIDE (GLUCOTROL) 10 MG tablet Take 1 tablet (10 mg total) by mouth 2 (two) times daily before a meal. 02/15/17   Baity, Salvadore Oxford, NP  glipiZIDE (GLUCOTROL) 10 MG tablet Take 1 tablet (10 mg total) by mouth 2 (two) times daily before a meal. 07/15/17   Willy Eddy, MD  lisinopril (PRINIVIL,ZESTRIL) 5 MG tablet Take 1 tablet (5 mg total) by mouth daily. 01/27/17   Lorre Munroe, NP  metFORMIN (GLUCOPHAGE) 1000 MG tablet Take 1 tablet (1,000 mg total) by mouth 2 (two) times daily. 07/15/17   Willy Eddy, MD  simvastatin (ZOCOR) 40 MG tablet Take 1 tablet (40 mg total) by  mouth daily. 01/27/17   Lorre Munroe, NP    Allergies Patient has no known allergies.    Social History Social History  Substance Use Topics  . Smoking status: Current Every Day Smoker    Packs/day: 0.50    Years: 10.00    Types: Cigarettes  . Smokeless tobacco: Never Used  . Alcohol use 0.0 oz/week     Comment: weekend beer drinker    Review of Systems Patient denies headaches, rhinorrhea, blurry vision, numbness, shortness of breath, chest pain, edema, cough, abdominal pain, nausea, vomiting, diarrhea, dysuria, fevers, rashes or hallucinations unless otherwise stated above in HPI. ____________________________________________   PHYSICAL EXAM:  VITAL SIGNS: Vitals:   07/15/17 1600 07/15/17 1630  BP: (!) 123/92 128/83  Pulse: 70 68  Resp: 15 20  Temp:    SpO2: 100% 99%    Constitutional: Alert and oriented. in no acute distress. Eyes: Conjunctivae are normal.  Head: Atraumatic. Nose: No congestion/rhinnorhea. Mouth/Throat: Mucous membranes are dry Neck: No stridor. Painless ROM.  Cardiovascular: Normal rate, regular rhythm. Grossly normal heart sounds.  Good peripheral circulation. Respiratory: Normal respiratory effort.  No retractions. Lungs CTAB. Gastrointestinal: Soft and nontender. No distention. No abdominal bruits. No CVA tenderness. Musculoskeletal: No lower extremity tenderness nor edema.  No joint effusions. Neurologic:  Normal speech and language. No gross focal neurologic deficits are appreciated. No facial droop Skin:  Skin is warm, dry and intact. No rash noted. Psychiatric: Mood and  affect are normal. Speech and behavior are normal.  ____________________________________________   LABS (all labs ordered are listed, but only abnormal results are displayed)  Results for orders placed or performed during the hospital encounter of 07/15/17 (from the past 24 hour(s))  Comprehensive metabolic panel     Status: Abnormal   Collection Time: 07/15/17   1:15 PM  Result Value Ref Range   Sodium 120 (L) 135 - 145 mmol/L   Potassium 5.1 3.5 - 5.1 mmol/L   Chloride 83 (L) 101 - 111 mmol/L   CO2 24 22 - 32 mmol/L   Glucose, Bld 802 (HH) 65 - 99 mg/dL   BUN 13 6 - 20 mg/dL   Creatinine, Ser 1.610.94 0.61 - 1.24 mg/dL   Calcium 9.1 8.9 - 09.610.3 mg/dL   Total Protein 8.5 (H) 6.5 - 8.1 g/dL   Albumin 2.8 (L) 3.5 - 5.0 g/dL   AST 045179 (H) 15 - 41 U/L   ALT 126 (H) 17 - 63 U/L   Alkaline Phosphatase 137 (H) 38 - 126 U/L   Total Bilirubin 2.1 (H) 0.3 - 1.2 mg/dL   GFR calc non Af Amer >60 >60 mL/min   GFR calc Af Amer >60 >60 mL/min   Anion gap 13 5 - 15  CBC with Differential     Status: Abnormal   Collection Time: 07/15/17  1:15 PM  Result Value Ref Range   WBC 7.0 3.8 - 10.6 K/uL   RBC 4.99 4.40 - 5.90 MIL/uL   Hemoglobin 15.5 13.0 - 18.0 g/dL   HCT 40.946.8 81.140.0 - 91.452.0 %   MCV 93.9 80.0 - 100.0 fL   MCH 31.1 26.0 - 34.0 pg   MCHC 33.2 32.0 - 36.0 g/dL   RDW 78.213.9 95.611.5 - 21.314.5 %   Platelets 101 (L) 150 - 440 K/uL   Neutrophils Relative % 71 %   Lymphocytes Relative 15 %   Monocytes Relative 14 %   Eosinophils Relative 0 %   Basophils Relative 0 %   Band Neutrophils 0 %   Metamyelocytes Relative 0 %   Myelocytes 0 %   Promyelocytes Absolute 0 %   Blasts 0 %   nRBC 0 0 /100 WBC   Other 0 %   Neutro Abs 4.9 1.4 - 6.5 K/uL   Lymphs Abs 1.1 1.0 - 3.6 K/uL   Monocytes Absolute 1.0 0.2 - 1.0 K/uL   Eosinophils Absolute 0.0 0 - 0.7 K/uL   Basophils Absolute 0.0 0 - 0.1 K/uL   Smear Review      PLATELET CLUMPS NOTED ON SMEAR, COUNT APPEARS ADEQUATE  Blood gas, venous     Status: Abnormal   Collection Time: 07/15/17  2:02 PM  Result Value Ref Range   pH, Ven 7.43 7.250 - 7.430   pCO2, Ven 40 (L) 44.0 - 60.0 mmHg   pO2, Ven 67.0 (H) 32.0 - 45.0 mmHg   Bicarbonate 26.5 20.0 - 28.0 mmol/L   Acid-Base Excess 2.0 0.0 - 2.0 mmol/L   O2 Saturation 93.6 %   Patient temperature 37.0    Collection site VEIN    Sample type VEIN   Urinalysis,  Complete w Microscopic     Status: Abnormal   Collection Time: 07/15/17  2:02 PM  Result Value Ref Range   Color, Urine STRAW (A) YELLOW   APPearance CLEAR (A) CLEAR   Specific Gravity, Urine 1.028 1.005 - 1.030   pH 6.0 5.0 - 8.0   Glucose, UA >=500 (A)  NEGATIVE mg/dL   Hgb urine dipstick SMALL (A) NEGATIVE   Bilirubin Urine NEGATIVE NEGATIVE   Ketones, ur 20 (A) NEGATIVE mg/dL   Protein, ur NEGATIVE NEGATIVE mg/dL   Nitrite NEGATIVE NEGATIVE   Leukocytes, UA TRACE (A) NEGATIVE   RBC / HPF 0-5 0 - 5 RBC/hpf   WBC, UA TOO NUMEROUS TO COUNT 0 - 5 WBC/hpf   Bacteria, UA NONE SEEN NONE SEEN   Squamous Epithelial / LPF 0-5 (A) NONE SEEN   Mucus PRESENT   Osmolality     Status: Abnormal   Collection Time: 07/15/17  2:40 PM  Result Value Ref Range   Osmolality 304 (H) 275 - 295 mOsm/kg  Glucose, capillary     Status: Abnormal   Collection Time: 07/15/17  3:29 PM  Result Value Ref Range   Glucose-Capillary 568 (HH) 65 - 99 mg/dL  Glucose, capillary     Status: Abnormal   Collection Time: 07/15/17  5:44 PM  Result Value Ref Range   Glucose-Capillary 463 (H) 65 - 99 mg/dL   ____________________________________________  EKG My review and personal interpretation at Time: 13:58   Indication: weakness  Rate: 70  Rhythm: sinus Axis: normal Other: no stemi, no depressions, normal intervals ____________________________________________  RADIOLOGY  I personally reviewed all radiographic images ordered to evaluate for the above acute complaints and reviewed radiology reports and findings.  These findings were personally discussed with the patient.  Please see medical record for radiology report.  ____________________________________________   PROCEDURES  Procedure(s) performed:  Procedures    Critical Care performed: no ____________________________________________   INITIAL IMPRESSION / ASSESSMENT AND PLAN / ED COURSE  Pertinent labs & imaging results that were available  during my care of the patient were reviewed by me and considered in my medical decision making (see chart for details).  DDX: dehydration, dka, hhns, hyperglycemia, medication non compliance  Cameron Coffey is a 56 y.o. who presents to the ED with 2 months weight loss polyuria and polydipsia. This is secondary to medication non-compliance. Well except the above differential shows evidence of marked hyperglycemia but no evidence of DKA. Mild dehydration. Clinically he does not have any evidence of hyperosmolar nonketotic syndrome. Will order also qualities to evaluate for any subclinical severe dehydration or hyperosmolar state. We will start an initial treatment with IV fluids as well as IV insulin.  Clinical Course as of Jul 15 1910  Thu Jul 15, 2017  1731 Osmolality is just above normal level. Glucose is appropriately coming down. Patient remains hemodynamically stable. Will give additional loss of fluid and reassessed.  [PR]  1757 repeat glucose 473.  She is tolerating oral hydration. Is in no acute distress. Discussed option for observation hospital for continued IV fluids the patient would prefer to follow up in outpatient clinic. We'll give refill for all his medications. based on his clinical presentation do believe this is reasonable. We'll also give referral to diabetes clinic.  Have discussed with the patient and available family all diagnostics and treatments performed thus far and all questions were answered to the best of my ability. The patient demonstrates understanding and agreement with plan.   [PR]    Clinical Course User Index [PR] Willy Eddy, MD     ____________________________________________   FINAL CLINICAL IMPRESSION(S) / ED DIAGNOSES  Final diagnoses:  Chronic hyperglycemia  Dehydration      NEW MEDICATIONS STARTED DURING THIS VISIT:  Discharge Medication List as of 07/15/2017  6:03 PM  Note:  This document was prepared using Dragon voice  recognition software and may include unintentional dictation errors.    Willy Eddy, MD 07/15/17 [phone]

## 2017-07-15 NOTE — ED Notes (Signed)
Pt returned from xray via wheelchair

## 2017-07-15 NOTE — ED Notes (Signed)
Case management at bedside.

## 2017-07-15 NOTE — ED Notes (Signed)
Pt taken to xray via wheelchair

## 2017-07-15 NOTE — ED Notes (Addendum)
Pt reports weight loss- 35lbs in 2 months. Pt states he feels tired. Alert and oriented. Denies CP.

## 2017-07-15 NOTE — ED Triage Notes (Signed)
Patient presents to ED via POV from home with c/o weight loss. Patient states he has lost over 35 pounds in the past two months. Patient denies N/V/D. Denies CP or SOB.

## 2017-07-15 NOTE — ED Notes (Signed)
Spoke with Dr. Roxan Hockeyobinson in regards to patients presentation. See orders. Verbal order to send patient to flex.

## 2017-07-24 ENCOUNTER — Encounter: Payer: Self-pay | Admitting: Emergency Medicine

## 2017-07-24 ENCOUNTER — Other Ambulatory Visit: Payer: Self-pay

## 2017-07-24 ENCOUNTER — Emergency Department: Payer: Self-pay

## 2017-07-24 ENCOUNTER — Inpatient Hospital Stay
Admission: EM | Admit: 2017-07-24 | Discharge: 2017-07-29 | DRG: 853 | Disposition: A | Discharge status: Skilled Nursing Facility | Payer: Self-pay | Attending: Internal Medicine | Admitting: Internal Medicine

## 2017-07-24 DIAGNOSIS — I10 Essential (primary) hypertension: Secondary | ICD-10-CM | POA: Diagnosis present

## 2017-07-24 DIAGNOSIS — Z590 Homelessness: Secondary | ICD-10-CM

## 2017-07-24 DIAGNOSIS — A419 Sepsis, unspecified organism: Secondary | ICD-10-CM | POA: Diagnosis present

## 2017-07-24 DIAGNOSIS — R339 Retention of urine, unspecified: Secondary | ICD-10-CM

## 2017-07-24 DIAGNOSIS — R748 Abnormal levels of other serum enzymes: Secondary | ICD-10-CM

## 2017-07-24 DIAGNOSIS — J90 Pleural effusion, not elsewhere classified: Secondary | ICD-10-CM | POA: Diagnosis present

## 2017-07-24 DIAGNOSIS — J189 Pneumonia, unspecified organism: Secondary | ICD-10-CM | POA: Diagnosis not present

## 2017-07-24 DIAGNOSIS — N412 Abscess of prostate: Secondary | ICD-10-CM | POA: Diagnosis present

## 2017-07-24 DIAGNOSIS — R739 Hyperglycemia, unspecified: Secondary | ICD-10-CM

## 2017-07-24 DIAGNOSIS — Z7984 Long term (current) use of oral hypoglycemic drugs: Secondary | ICD-10-CM

## 2017-07-24 DIAGNOSIS — Z9114 Patient's other noncompliance with medication regimen: Secondary | ICD-10-CM

## 2017-07-24 DIAGNOSIS — E872 Acidosis: Secondary | ICD-10-CM | POA: Diagnosis present

## 2017-07-24 DIAGNOSIS — E785 Hyperlipidemia, unspecified: Secondary | ICD-10-CM | POA: Diagnosis present

## 2017-07-24 DIAGNOSIS — N390 Urinary tract infection, site not specified: Secondary | ICD-10-CM

## 2017-07-24 DIAGNOSIS — Z79899 Other long term (current) drug therapy: Secondary | ICD-10-CM

## 2017-07-24 DIAGNOSIS — E860 Dehydration: Secondary | ICD-10-CM | POA: Diagnosis present

## 2017-07-24 DIAGNOSIS — I248 Other forms of acute ischemic heart disease: Secondary | ICD-10-CM | POA: Diagnosis present

## 2017-07-24 DIAGNOSIS — Z87891 Personal history of nicotine dependence: Secondary | ICD-10-CM

## 2017-07-24 DIAGNOSIS — E871 Hypo-osmolality and hyponatremia: Secondary | ICD-10-CM | POA: Diagnosis present

## 2017-07-24 DIAGNOSIS — E875 Hyperkalemia: Secondary | ICD-10-CM | POA: Diagnosis not present

## 2017-07-24 DIAGNOSIS — E1165 Type 2 diabetes mellitus with hyperglycemia: Secondary | ICD-10-CM | POA: Diagnosis present

## 2017-07-24 DIAGNOSIS — Z23 Encounter for immunization: Secondary | ICD-10-CM

## 2017-07-24 DIAGNOSIS — A4101 Sepsis due to Methicillin susceptible Staphylococcus aureus: Principal | ICD-10-CM | POA: Diagnosis present

## 2017-07-24 DIAGNOSIS — N400 Enlarged prostate without lower urinary tract symptoms: Secondary | ICD-10-CM | POA: Diagnosis present

## 2017-07-24 DIAGNOSIS — N12 Tubulo-interstitial nephritis, not specified as acute or chronic: Secondary | ICD-10-CM | POA: Diagnosis present

## 2017-07-24 DIAGNOSIS — R011 Cardiac murmur, unspecified: Secondary | ICD-10-CM | POA: Diagnosis not present

## 2017-07-24 LAB — BASIC METABOLIC PANEL
ANION GAP: 6 (ref 5–15)
Anion gap: 12 (ref 5–15)
BUN: 10 mg/dL (ref 6–20)
BUN: 12 mg/dL (ref 6–20)
CALCIUM: 8.2 mg/dL — AB (ref 8.9–10.3)
CHLORIDE: 92 mmol/L — AB (ref 101–111)
CO2: 25 mmol/L (ref 22–32)
CO2: 27 mmol/L (ref 22–32)
CREATININE: 0.95 mg/dL (ref 0.61–1.24)
Calcium: 7.9 mg/dL — ABNORMAL LOW (ref 8.9–10.3)
Chloride: 86 mmol/L — ABNORMAL LOW (ref 101–111)
Creatinine, Ser: 0.89 mg/dL (ref 0.61–1.24)
GFR calc Af Amer: >60 mL/min (ref >60)
GFR calc non Af Amer: >60 mL/min (ref >60)
GLUCOSE: 488 mg/dL — AB (ref 65–99)
Glucose, Bld: 422 mg/dL — ABNORMAL HIGH (ref 65–99)
Potassium: 5.6 mmol/L — ABNORMAL HIGH (ref 3.5–5.1)
Potassium: 3.6 mmol/L (ref 3.5–5.1)
SODIUM: 125 mmol/L — AB (ref 135–145)
Sodium: 123 mmol/L — ABNORMAL LOW (ref 135–145)

## 2017-07-24 LAB — GLUCOSE, CAPILLARY
GLUCOSE-CAPILLARY: 382 mg/dL — AB (ref 65–99)
GLUCOSE-CAPILLARY: 135 mg/dL — AB (ref 65–99)
Glucose-Capillary: 497 mg/dL — ABNORMAL HIGH (ref 65–99)
Glucose-Capillary: 353 mg/dL — ABNORMAL HIGH (ref 65–99)
Glucose-Capillary: 420 mg/dL — ABNORMAL HIGH (ref 65–99)
Glucose-Capillary: 359 mg/dL — ABNORMAL HIGH (ref 65–99)

## 2017-07-24 LAB — BLOOD CULTURE ID PANEL (REFLEXED)
Acinetobacter baumannii: NOT DETECTED
CANDIDA ALBICANS: NOT DETECTED
CANDIDA GLABRATA: NOT DETECTED
CANDIDA PARAPSILOSIS: NOT DETECTED
CANDIDA TROPICALIS: NOT DETECTED
Candida krusei: NOT DETECTED
ENTEROBACTER CLOACAE COMPLEX: NOT DETECTED
ENTEROBACTERIACEAE SPECIES: NOT DETECTED
Enterococcus species: NOT DETECTED
Escherichia coli: NOT DETECTED
Haemophilus influenzae: NOT DETECTED
KLEBSIELLA OXYTOCA: NOT DETECTED
Klebsiella pneumoniae: NOT DETECTED
Listeria monocytogenes: NOT DETECTED
Methicillin resistance: NOT DETECTED
NEISSERIA MENINGITIDIS: NOT DETECTED
Proteus species: NOT DETECTED
Pseudomonas aeruginosa: NOT DETECTED
STREPTOCOCCUS PNEUMONIAE: NOT DETECTED
STREPTOCOCCUS PYOGENES: NOT DETECTED
STREPTOCOCCUS SPECIES: NOT DETECTED
Serratia marcescens: NOT DETECTED
Staphylococcus aureus (BCID): DETECTED — AB
Staphylococcus species: DETECTED — AB
Streptococcus agalactiae: NOT DETECTED

## 2017-07-24 LAB — URINALYSIS, COMPLETE (UACMP) WITH MICROSCOPIC
BACTERIA UA: NONE SEEN
BILIRUBIN URINE: NEGATIVE
Glucose, UA: >=500 mg/dL — AB
KETONES UR: 5 mg/dL — AB
Nitrite: POSITIVE — AB
PROTEIN: 30 mg/dL — AB
SQUAMOUS EPITHELIAL / LPF: NONE SEEN
Specific Gravity, Urine: 1.016 (ref 1.005–1.030)
pH: 6 (ref 5.0–8.0)

## 2017-07-24 LAB — CBC
HEMATOCRIT: 44.8 % (ref 40.0–52.0)
Hemoglobin: 15.4 g/dL (ref 13.0–18.0)
MCH: 31.9 pg (ref 26.0–34.0)
MCHC: 34.4 g/dL (ref 32.0–36.0)
MCV: 92.9 fL (ref 80.0–100.0)
PLATELETS: 195 10*3/uL (ref 150–440)
RBC: 4.83 MIL/uL (ref 4.40–5.90)
RDW: 14.2 % (ref 11.5–14.5)
WBC: 17.9 10*3/uL — ABNORMAL HIGH (ref 3.8–10.6)

## 2017-07-24 LAB — BLOOD GAS, VENOUS
ACID-BASE EXCESS: 2.2 mmol/L — AB (ref 0.0–2.0)
BICARBONATE: 25.5 mmol/L (ref 20.0–28.0)
O2 SAT: 90.2 %
PCO2 VEN: 35 mmHg — AB (ref 44.0–60.0)
PH VEN: 7.47 — AB (ref 7.250–7.430)
Patient temperature: 37
pO2, Ven: 55 mmHg — ABNORMAL HIGH (ref 32.0–45.0)

## 2017-07-24 LAB — HEPARIN LEVEL (UNFRACTIONATED): Heparin Unfractionated: 0.2 IU/mL — ABNORMAL LOW (ref 0.30–0.70)

## 2017-07-24 LAB — HEMOGLOBIN A1C
Hgb A1c MFr Bld: 14.5 % — ABNORMAL HIGH (ref 4.8–5.6)
MEAN PLASMA GLUCOSE: 369.45 mg/dL

## 2017-07-24 LAB — LACTIC ACID, PLASMA
Lactic Acid, Venous: 4.1 mmol/L (ref 0.5–1.9)
Lactic Acid, Venous: 3.4 mmol/L (ref 0.5–1.9)

## 2017-07-24 LAB — PROTIME-INR
INR: 1.41
PROTHROMBIN TIME: 17.1 s — AB (ref 11.4–15.2)

## 2017-07-24 LAB — POTASSIUM: POTASSIUM: 3.9 mmol/L (ref 3.5–5.1)

## 2017-07-24 LAB — TROPONIN I
Troponin I: 0.24 ng/mL (ref <0.03)
Troponin I: 0.7 ng/mL (ref <0.03)
Troponin I: 0.04 ng/mL (ref <0.03)

## 2017-07-24 LAB — APTT: aPTT: 31 seconds (ref 24–36)

## 2017-07-24 MED ORDER — ENOXAPARIN SODIUM 40 MG/0.4ML ~~LOC~~ SOLN: 40.0000 mg | SUBCUTANEOUS | Status: DC

## 2017-07-24 MED ORDER — PNEUMOCOCCAL VAC POLYVALENT 25 MCG/0.5ML IJ INJ
0.5000 mL | INJECTION | INTRAMUSCULAR | Status: AC
  Administered 2017-07-26: 0.5 mL via INTRAMUSCULAR
  Filled 2017-07-24: qty 0.5

## 2017-07-24 MED ORDER — SODIUM CHLORIDE 0.9 % IV BOLUS (SEPSIS)
1000.0000 mL | Freq: Once | INTRAVENOUS | Status: AC
  Administered 2017-07-24: 1000 mL via INTRAVENOUS

## 2017-07-24 MED ORDER — HYDROCODONE-ACETAMINOPHEN 5-325 MG PO TABS
1.0000 | ORAL_TABLET | ORAL | Status: DC | PRN
  Administered 2017-07-24 – 2017-07-29 (×7): 2 via ORAL
  Filled 2017-07-24 (×8): qty 2

## 2017-07-24 MED ORDER — SENNOSIDES-DOCUSATE SODIUM 8.6-50 MG PO TABS
1.0000 | ORAL_TABLET | Freq: Every evening | ORAL | Status: DC | PRN
  Administered 2017-07-27: 1 via ORAL
  Filled 2017-07-24: qty 1

## 2017-07-24 MED ORDER — INSULIN ASPART 100 UNIT/ML ~~LOC~~ SOLN
0.0000 [IU] | Freq: Every day | SUBCUTANEOUS | Status: DC
  Administered 2017-07-27: 3 [IU] via SUBCUTANEOUS
  Filled 2017-07-24 (×2): qty 1

## 2017-07-24 MED ORDER — ONDANSETRON HCL 4 MG/2ML IJ SOLN: 4.0000 mg | Freq: Four times a day (QID) | INTRAMUSCULAR | Status: DC | PRN

## 2017-07-24 MED ORDER — ACETAMINOPHEN 650 MG RE SUPP: 650.0000 mg | Freq: Four times a day (QID) | RECTAL | Status: DC | PRN

## 2017-07-24 MED ORDER — INSULIN ASPART 100 UNIT/ML ~~LOC~~ SOLN
20.0000 [IU] | Freq: Once | SUBCUTANEOUS | Status: AC
  Administered 2017-07-24: 20 [IU] via SUBCUTANEOUS
  Filled 2017-07-24: qty 1

## 2017-07-24 MED ORDER — INSULIN ASPART 100 UNIT/ML ~~LOC~~ SOLN
10.0000 [IU] | Freq: Once | SUBCUTANEOUS | Status: AC
  Administered 2017-07-24: 10 [IU] via INTRAVENOUS
  Filled 2017-07-24: qty 1

## 2017-07-24 MED ORDER — ACETAMINOPHEN 325 MG PO TABS
650.0000 mg | ORAL_TABLET | Freq: Four times a day (QID) | ORAL | Status: DC | PRN
  Administered 2017-07-25 – 2017-07-27 (×3): 650 mg via ORAL
  Filled 2017-07-24 (×2): qty 2

## 2017-07-24 MED ORDER — INSULIN ASPART 100 UNIT/ML ~~LOC~~ SOLN
0.0000 [IU] | Freq: Three times a day (TID) | SUBCUTANEOUS | Status: DC
  Administered 2017-07-24: 15 [IU] via SUBCUTANEOUS
  Filled 2017-07-24: qty 1

## 2017-07-24 MED ORDER — ONDANSETRON HCL 4 MG PO TABS: 4.0000 mg | ORAL_TABLET | Freq: Four times a day (QID) | ORAL | Status: DC | PRN

## 2017-07-24 MED ORDER — HEPARIN (PORCINE) IN NACL 100-0.45 UNIT/ML-% IJ SOLN
1750.0000 [IU]/h | INTRAMUSCULAR | Status: DC
  Administered 2017-07-24: 750 [IU]/h via INTRAVENOUS
  Administered 2017-07-25: 1250 [IU]/h via INTRAVENOUS
  Filled 2017-07-24 (×3): qty 250

## 2017-07-24 MED ORDER — INSULIN GLARGINE 100 UNIT/ML ~~LOC~~ SOLN
20.0000 [IU] | Freq: Every day | SUBCUTANEOUS | Status: DC
  Filled 2017-07-24 (×4): qty 0.2

## 2017-07-24 MED ORDER — ACETAMINOPHEN 325 MG PO TABS
ORAL_TABLET | ORAL | Status: AC
  Filled 2017-07-24: qty 2

## 2017-07-24 MED ORDER — PIPERACILLIN-TAZOBACTAM 3.375 G IVPB
3.3750 g | Freq: Three times a day (TID) | INTRAVENOUS | Status: DC
  Administered 2017-07-24 – 2017-07-28 (×12): 3.375 g via INTRAVENOUS
  Filled 2017-07-24 (×13): qty 50

## 2017-07-24 MED ORDER — ASPIRIN 81 MG PO CHEW
81.0000 mg | CHEWABLE_TABLET | Freq: Every day | ORAL | Status: DC
  Administered 2017-07-24 – 2017-07-29 (×6): 81 mg via ORAL
  Filled 2017-07-24 (×6): qty 1

## 2017-07-24 MED ORDER — INFLUENZA VAC SPLIT QUAD 0.5 ML IM SUSY
0.5000 mL | PREFILLED_SYRINGE | INTRAMUSCULAR | Status: AC
  Administered 2017-07-26: 0.5 mL via INTRAMUSCULAR
  Filled 2017-07-24: qty 0.5

## 2017-07-24 MED ORDER — HEPARIN BOLUS VIA INFUSION
900.0000 [IU] | Freq: Once | INTRAVENOUS | Status: AC
  Administered 2017-07-24: 900 [IU] via INTRAVENOUS
  Filled 2017-07-24: qty 900

## 2017-07-24 MED ORDER — VANCOMYCIN HCL IN DEXTROSE 1-5 GM/200ML-% IV SOLN
1000.0000 mg | Freq: Once | INTRAVENOUS | Status: AC
  Administered 2017-07-24: 1000 mg via INTRAVENOUS

## 2017-07-24 MED ORDER — SIMVASTATIN 20 MG PO TABS
40.0000 mg | ORAL_TABLET | Freq: Every day | ORAL | Status: DC
  Administered 2017-07-24 – 2017-07-29 (×6): 40 mg via ORAL
  Filled 2017-07-24 (×6): qty 2

## 2017-07-24 MED ORDER — PIPERACILLIN-TAZOBACTAM 3.375 G IVPB 30 MIN
3.3750 g | Freq: Once | INTRAVENOUS | Status: AC
  Administered 2017-07-24: 3.375 g via INTRAVENOUS

## 2017-07-24 MED ORDER — VANCOMYCIN HCL IN DEXTROSE 1-5 GM/200ML-% IV SOLN
INTRAVENOUS | Status: AC
  Filled 2017-07-24: qty 200

## 2017-07-24 MED ORDER — SODIUM CHLORIDE 0.9 % IV SOLN
INTRAVENOUS | Status: DC
  Administered 2017-07-24 – 2017-07-28 (×6): via INTRAVENOUS

## 2017-07-24 MED ORDER — HEPARIN BOLUS VIA INFUSION
3900.0000 [IU] | Freq: Once | INTRAVENOUS | Status: AC
  Administered 2017-07-24: 3900 [IU] via INTRAVENOUS
  Filled 2017-07-24: qty 3900

## 2017-07-24 MED ORDER — SODIUM POLYSTYRENE SULFONATE 15 GM/60ML PO SUSP
15.0000 g | Freq: Once | ORAL | Status: AC
  Administered 2017-07-24: 15 g via ORAL
  Filled 2017-07-24: qty 60

## 2017-07-24 MED ORDER — INSULIN ASPART 100 UNIT/ML ~~LOC~~ SOLN
0.0000 [IU] | Freq: Three times a day (TID) | SUBCUTANEOUS | Status: DC
  Administered 2017-07-24 – 2017-07-25 (×2): 20 [IU] via SUBCUTANEOUS
  Administered 2017-07-25: 11 [IU] via SUBCUTANEOUS
  Administered 2017-07-25 – 2017-07-26 (×2): 4 [IU] via SUBCUTANEOUS
  Administered 2017-07-26: 15 [IU] via SUBCUTANEOUS
  Administered 2017-07-26 – 2017-07-27 (×2): 4 [IU] via SUBCUTANEOUS
  Administered 2017-07-27: 15 [IU] via SUBCUTANEOUS
  Administered 2017-07-27: 6 [IU] via SUBCUTANEOUS
  Administered 2017-07-28: 7 [IU] via SUBCUTANEOUS
  Administered 2017-07-28: 4 [IU] via SUBCUTANEOUS
  Administered 2017-07-29: 11 [IU] via SUBCUTANEOUS
  Filled 2017-07-24 (×13): qty 1

## 2017-07-24 MED ORDER — METFORMIN HCL 500 MG PO TABS
1000.0000 mg | ORAL_TABLET | Freq: Two times a day (BID) | ORAL | Status: DC
  Administered 2017-07-24: 1000 mg via ORAL
  Filled 2017-07-24: qty 2

## 2017-07-24 MED ORDER — GLIPIZIDE 10 MG PO TABS
10.0000 mg | ORAL_TABLET | Freq: Two times a day (BID) | ORAL | Status: DC
  Administered 2017-07-24 – 2017-07-29 (×10): 10 mg via ORAL
  Filled 2017-07-24 (×12): qty 1

## 2017-07-24 MED ORDER — LISINOPRIL 5 MG PO TABS
5.0000 mg | ORAL_TABLET | Freq: Every day | ORAL | Status: DC
  Administered 2017-07-24 – 2017-07-29 (×5): 5 mg via ORAL
  Filled 2017-07-24 (×6): qty 1

## 2017-07-24 MED ORDER — ACETAMINOPHEN 325 MG PO TABS
650.0000 mg | ORAL_TABLET | Freq: Once | ORAL | Status: AC
  Administered 2017-07-24: 650 mg via ORAL

## 2017-07-24 MED ORDER — METOPROLOL TARTRATE 25 MG PO TABS
12.5000 mg | ORAL_TABLET | Freq: Two times a day (BID) | ORAL | Status: DC
  Administered 2017-07-24 – 2017-07-29 (×9): 12.5 mg via ORAL
  Filled 2017-07-24 (×10): qty 1

## 2017-07-24 NOTE — Plan of Care (Signed)
Problem: Physical Regulation: Goal: Ability to maintain clinical measurements within normal limits will improve Outcome: Not Progressing Sodium level still = only 125 today. Will continue to monitor. Jari Favre [facility]

## 2017-07-24 NOTE — Progress Notes (Signed)
ANTICOAGULATION CONSULT NOTE - Initial Consult  Pharmacy Consult for heparin  Indication: chest pain/ACS  No Known Allergies  Patient Measurements: Height:  (172.7 cm) Weight: 142 lb (64.4 kg) IBW/kg (Calculated) : 68.4 Heparin Dosing Weight: 64.4kg  Vital Signs: Temp: 100.5 F (38.1 C) (09/15 1933) Temp Source: Oral (09/15 1933) BP: 127/71 (09/15 1933) Pulse Rate: 104 (09/15 1933)  Labs:  Recent Labs  07/24/17 0423 07/24/17 1012 07/24/17 1223 07/24/17 1911  HGB 15.4  --   --   --   HCT 44.8  --   --   --   PLT 195  --   --   --   APTT  --   --  31  --   LABPROT  --   --  17.1*  --   INR  --   --  1.41  --   HEPARINUNFRC  --   --   --  0.20*  CREATININE 0.95 0.89  --   --   TROPONINI 0.24* 0.70* 0.04*  --     Estimated Creatinine Clearance: 85.4 mL/min (by C-G formula based on SCr of 0.89 mg/dL).   Medical History: Past Medical History:  Diagnosis Date  . Diabetes mellitus without complication (HCC)     Medications:    Assessment:  56yo male presents with chest pain and elevated troponin. Pharmacy was consulted for heparin drip.   Goal of Therapy:  Heparin level 0.3-0.7 units/ml Monitor platelets by anticoagulation protocol: Yes   Plan:  Give 3900 units bolus x 1  Will initiate heparin drip at 750 units/hr. Will check HL 6 hours after heparin drip started. Pharmacy will continue to monitor and adjust as needed.    9/15 1911 HL subtherapeutic at 0.20. Will order 900unit bolus and increase heparin infusion to 900units/hr. Recheck heparin level in 6 hours.   Gardner Candle, PharmD, BCPS Clinical Pharmacist 07/24/2017 8:07 PM

## 2017-07-24 NOTE — ED Triage Notes (Signed)
Per pt, he has had fever x 1 week. Pt reports that he was here a week ago with the same. Pt also reports hyperglycemia. Pt is experiencing body aches at this time.

## 2017-07-24 NOTE — ED Notes (Signed)
Critical labs of potassium 5.6, troponin of 0.28, glucose of 488 and sodium of 123 called from lab. Dr. Marisa Severin notified, order for insulin bolus to be placed by md.

## 2017-07-24 NOTE — Progress Notes (Signed)
Patient lethargic and having trouble voiding. PVR is negative however. Notified Dr. Emmit Pomfret. No new orders.

## 2017-07-24 NOTE — ED Notes (Signed)
Critical lactic acid of 4.1 called from lab. Dr. Marisa Severin notified, order for antibiotics received.

## 2017-07-24 NOTE — Progress Notes (Addendum)
ANTICOAGULATION CONSULT NOTE - Initial Consult  Pharmacy Consult for heparin  Indication: chest pain/ACS  No Known Allergies  Patient Measurements: Height:  (172.7 cm) Weight: 142 lb (64.4 kg) IBW/kg (Calculated) : 68.4 Heparin Dosing Weight: 64.4kg  Vital Signs: Temp: 97.6 F (36.4 C) (09/15 1116) Temp Source: Oral (09/15 1116) BP: 98/71 (09/15 1116) Pulse Rate: 72 (09/15 1116)  Labs:  Recent Labs  07/24/17 0423 07/24/17 1012  HGB 15.4  --   HCT 44.8  --   PLT 195  --   CREATININE 0.95  --   TROPONINI 0.24* 0.70*    Estimated Creatinine Clearance: 80 mL/min (by C-G formula based on SCr of 0.95 mg/dL).   Medical History: Past Medical History:  Diagnosis Date  . Diabetes mellitus without complication (HCC)   . Hyperlipidemia     Medications:    Assessment:  56yo male presents with chest pain and elevated troponin. Pharmacy was consulted for heparin drip.   Goal of Therapy:  Heparin level 0.3-0.7 units/ml Monitor platelets by anticoagulation protocol: Yes   Plan:  Give 3900 units bolus x 1  Will initiate heparin drip at 750 units/hr. Will check HL 6 hours after heparin drip started. Pharmacy will continue to monitor and adjust as needed.   Yolanda Bonine, PharmD Pharmacy Resident 07/24/2017,12:12 PM

## 2017-07-24 NOTE — H&P (Signed)
[facility] - [place] at [facility]   PATIENT NAME: Cameron Coffey    MR#:  [id number]  DATE OF BIRTH:  09/05/1961  DATE OF ADMISSION:  07/24/2017  PRIMARY CARE PHYSICIAN: Lorre Munroe, NP   REQUESTING/REFERRING PHYSICIAN:   CHIEF COMPLAINT:   Chief Complaint  Patient presents with  . Fever  . Hyperglycemia    HISTORY OF PRESENT ILLNESS: Cameron Coffey  is a 56 y.o. male with a known history of diabetes mellitus type 2, hyperlipidemia presented to the emergency room with fever and chills. Patient also has burning sensation when he passed urine. Has generalized weakness. Patient was evaluated in the emergency room was found to have urinary tract infection, his lactic acid level was elevated. Patient's blood sugar was also elevated during the workup. Patient was dry and dehydrated. Code sepsis was called and patient was started on IV fluid resuscitation based on sepsis protocol. Patient received vancomycin and Zosyn antibiotics in the emergency room intravenously.  PAST MEDICAL HISTORY:   Past Medical History:  Diagnosis Date  . Diabetes mellitus without complication (HCC)   . Hyperlipidemia     PAST SURGICAL HISTORY: Past Surgical History:  Procedure Laterality Date  . none      SOCIAL HISTORY:  Social History  Substance Use Topics  . Smoking status: Former Smoker    Packs/day: 0.50    Years: 10.00    Types: Cigarettes  . Smokeless tobacco: Never Used  . Alcohol use 0.0 oz/week     Comment: weekend beer drinker    FAMILY HISTORY:  Family History  Problem Relation Age of Onset  . Hyperlipidemia Mother   . Diabetes Mother   . Hyperlipidemia Father   . Diabetes Father     DRUG ALLERGIES: No Known Allergies  REVIEW OF SYSTEMS:   CONSTITUTIONAL: Has fever, fatigue and weakness.  EYES: No blurred or double vision.  EARS, NOSE, AND THROAT: No tinnitus or ear pain.  RESPIRATORY: No cough, shortness of breath, wheezing or hemoptysis.   CARDIOVASCULAR: No chest pain, orthopnea, edema.  GASTROINTESTINAL: No nausea, vomiting, diarrhea or abdominal pain.  GENITOURINARY: Has dysuria, No hematuria.  ENDOCRINE: Has polyuria, nocturia,  HEMATOLOGY: No anemia, easy bruising or bleeding SKIN: No rash or lesion. MUSCULOSKELETAL: No joint pain or arthritis.   NEUROLOGIC: No tingling, numbness, weakness.  PSYCHIATRY: No anxiety or depression.   MEDICATIONS AT HOME:  Prior to Admission medications   Medication Sig Start Date End Date Taking? Authorizing Provider  glipiZIDE (GLUCOTROL) 10 MG tablet Take 1 tablet (10 mg total) by mouth 2 (two) times daily before a meal. 07/15/17  Yes Willy Eddy, MD  metFORMIN (GLUCOPHAGE) 1000 MG tablet Take 1 tablet (1,000 mg total) by mouth 2 (two) times daily. 07/15/17  Yes Willy Eddy, MD  lisinopril (PRINIVIL,ZESTRIL) 5 MG tablet Take 1 tablet (5 mg total) by mouth daily. 01/27/17   Lorre Munroe, NP  simvastatin (ZOCOR) 40 MG tablet Take 1 tablet (40 mg total) by mouth daily. 01/27/17   Lorre Munroe, NP      PHYSICAL EXAMINATION:   VITAL SIGNS: Blood pressure 119/75, pulse (!) 122, temperature 100.2 F (37.9 C), temperature source Oral, resp. rate (!) 21, height  (1.727 m), weight 64.4 kg (142 lb), SpO2 98 %.  GENERAL:  56 y.o.-year-old patient lying in the bed with no acute distress.  EYES: Pupils equal, round, reactive to light and accommodation. No scleral icterus. Extraocular muscles intact.  HEENT: Head atraumatic, normocephalic. Oropharynx  dry and nasopharynx clear.  NECK:  Supple, no jugular venous distention. No thyroid enlargement, no tenderness.  LUNGS: Normal breath sounds bilaterally, no wheezing, rales,rhonchi or crepitation. No use of accessory muscles of respiration.  CARDIOVASCULAR: S1, S2 normal. No murmurs, rubs, or gallops.  ABDOMEN: Soft, nontender, nondistended. Bowel sounds present. No organomegaly or mass.  EXTREMITIES: No pedal edema, cyanosis, or  clubbing.  NEUROLOGIC: Cranial nerves II through XII are intact. Muscle strength 5/5 in all extremities. Sensation intact. Gait not checked.  PSYCHIATRIC: The patient is alert and oriented x 3.  SKIN: No obvious rash, lesion, or ulcer.   LABORATORY PANEL:   CBC  Recent Labs Lab 07/24/17 0423  WBC 17.9*  HGB 15.4  HCT 44.8  PLT 195  MCV 92.9  MCH 31.9  MCHC 34.4  RDW 14.2   ------------------------------------------------------------------------------------------------------------------  Chemistries   Recent Labs Lab 07/24/17 0423  NA 123*  K 5.6*  CL 86*  CO2 25  GLUCOSE 488*  BUN 10  CREATININE 0.95  CALCIUM 8.2*   ------------------------------------------------------------------------------------------------------------------ estimated creatinine clearance is 80 mL/min (by C-G formula based on SCr of 0.95 mg/dL). ------------------------------------------------------------------------------------------------------------------ No results for input(s): TSH, T4TOTAL, T3FREE, THYROIDAB in the last 72 hours.  Invalid input(s): FREET3   Coagulation profile No results for input(s): INR, PROTIME in the last 168 hours. ------------------------------------------------------------------------------------------------------------------- No results for input(s): DDIMER in the last 72 hours. -------------------------------------------------------------------------------------------------------------------  Cardiac Enzymes  Recent Labs Lab 07/24/17 0423  TROPONINI 0.24*   ------------------------------------------------------------------------------------------------------------------ Invalid input(s): POCBNP  ---------------------------------------------------------------------------------------------------------------  Urinalysis    Component Value Date/Time   COLORURINE YELLOW (A) 07/24/2017 0423   APPEARANCEUR CLOUDY (A) 07/24/2017 0423   LABSPEC 1.016  07/24/2017 0423   PHURINE 6.0 07/24/2017 0423   GLUCOSEU >=500 (A) 07/24/2017 0423   HGBUR SMALL (A) 07/24/2017 0423   BILIRUBINUR NEGATIVE 07/24/2017 0423   KETONESUR 5 (A) 07/24/2017 0423   PROTEINUR 30 (A) 07/24/2017 0423   NITRITE POSITIVE (A) 07/24/2017 0423   LEUKOCYTESUR LARGE (A) 07/24/2017 0423     RADIOLOGY: Dg Chest Port 1 View  Result Date: 07/24/2017 CLINICAL DATA:  Sepsis, hypoxia, rule out pneumonia. EXAM: PORTABLE CHEST 1 VIEW COMPARISON:  Chest radiograph 07/15/2017 FINDINGS: The cardiomediastinal contours are normal. The lungs are clear. Pulmonary vasculature is normal. No consolidation, pleural effusion, or pneumothorax. No acute osseous abnormalities are seen. IMPRESSION: No acute abnormality.  No evidence of pneumonia. Electronically Signed   By: Rubye Oaks M.D.   On: 07/24/2017 05:03    EKG: Orders placed or performed during the hospital encounter of 07/24/17  . ED EKG  . ED EKG  . ED EKG 12-Lead  . ED EKG 12-Lead    IMPRESSION AND PLAN: 56 year old male patient with history of diabetes mellitus, hyperlipidemia presented to the emergency room with fever, chills and weakness.  Admitting diagnosis 1. Sepsis 2. Urinary tract infection 3. Hyperkalemia 4. Hyponatremia 5. Uncontrolled diabetes mellitus 6.Elevated troponin from demand ischemia Treatment plan Admit patient to medical floor IV fluid hydration Start patient on IV Zosyn antibiotic Oral Kayexalate for hyperkalemia Follow-up sodium level   All the records are reviewed and case discussed with ED provider. Management plans discussed with the patient, family and they are in agreement.  CODE STATUS:FULL CODE Code Status History    This patient does not have a recorded code status. Please follow your organizational policy for patients in this situation.       TOTAL TIME TAKING CARE OF THIS PATIENT: 50 minutes.    Danella Philson  M.D on 07/24/2017 at 6:33 AM  Between 7am to 6pm - Pager  - [phone]  After 6pm go to [email or web address] - password [email or web address] [facility]  [facility]  Office  [phone]  CC: Primary care physician; Lorre Munroe, NP

## 2017-07-24 NOTE — ED Notes (Signed)
Pt complains of generalized body aches, pain with urination, back pain and fever. Pt's girlfriend states pt has not taken insulin in over one week. Pt with skin tenting noted, dry oral mucus membranes noted. Pt states he feels weak and has had increased urination. Pt complains of thirst.

## 2017-07-24 NOTE — Progress Notes (Signed)
Pharmacy Antibiotic Note  Cameron Coffey is a 56 y.o. male admitted on 07/24/2017 with UTI.  Pharmacy has been consulted for zosyn dosing.  Plan: Zosyn 3.375g IV q8h (4 hour infusion).  Height:  (172.7 cm) Weight: 142 lb (64.4 kg) IBW/kg (Calculated) : 68.4  Temp (24hrs), Avg:100.2 F (37.9 C), Min:100.2 F (37.9 C), Max:100.2 F (37.9 C)   Recent Labs Lab 07/24/17 0423  WBC 17.9*  CREATININE 0.95  LATICACIDVEN 4.1*    Estimated Creatinine Clearance: 80 mL/min (by C-G formula based on SCr of 0.95 mg/dL).    No Known Allergies  Thank you for allowing pharmacy to be a part of this patient's care.  Thomasene Ripple, PharmD, BCPS Clinical Pharmacist 07/24/2017

## 2017-07-24 NOTE — ED Provider Notes (Signed)
[facility] Emergency Department Provider Note ____________________________________________   First MD Initiated Contact with Patient 07/24/17 [phone]     (approximate)  I have reviewed the triage vital signs and the nursing notes.   HISTORY  Chief Complaint Fever and Hyperglycemia    HPI Cameron Coffey is a 56 y.o. male With history of diabetes who presents with hyperglycemia over the last several days with sugars in the 500s, gradual onset, and associated with generalized weakness, body aches, back pain, and fever. Patient's girlfriend states he has not been compliant with insulin for the last week. Patient reports increased urination.   Past Medical History:  Diagnosis Date  . Diabetes mellitus without complication (HCC)   . Hyperlipidemia     Patient Active Problem List   Diagnosis Date Noted  . Sepsis (HCC) 07/24/2017  . Type 2 diabetes mellitus without complication (HCC) 05/28/2015  . HLD (hyperlipidemia) 05/28/2015  . Essential hypertension 05/28/2015    History reviewed. No pertinent surgical history.  Prior to Admission medications   Medication Sig Start Date End Date Taking? Authorizing Provider  glipiZIDE (GLUCOTROL) 10 MG tablet Take 1 tablet (10 mg total) by mouth 2 (two) times daily before a meal. 07/15/17  Yes Willy Eddy, MD  metFORMIN (GLUCOPHAGE) 1000 MG tablet Take 1 tablet (1,000 mg total) by mouth 2 (two) times daily. 07/15/17  Yes Willy Eddy, MD  lisinopril (PRINIVIL,ZESTRIL) 5 MG tablet Take 1 tablet (5 mg total) by mouth daily. 01/27/17   Lorre Munroe, NP  simvastatin (ZOCOR) 40 MG tablet Take 1 tablet (40 mg total) by mouth daily. 01/27/17   Lorre Munroe, NP    Allergies Patient has no known allergies.  Family History  Problem Relation Age of Onset  . Hyperlipidemia Mother   . Diabetes Mother   . Hyperlipidemia Father   . Diabetes Father     Social History Social History  Substance Use Topics  .  Smoking status: Former Smoker    Packs/day: 0.50    Years: 10.00    Types: Cigarettes  . Smokeless tobacco: Never Used  . Alcohol use 0.0 oz/week     Comment: weekend beer drinker    Review of Systems  Constitutional: Positive for fever Eyes: No redness. ENT: No sore throat. Cardiovascular: Denies chest pain. Respiratory: Denies shortness of breath. Gastrointestinal: No vomiting.  Genitourinary: Positive for dysuria and frequency Musculoskeletal: Positive for back pain. Skin: Negative for rash. Neurological: Negative for headaches, focal weakness or numbness.   ____________________________________________   PHYSICAL EXAM:  VITAL SIGNS: ED Triage Vitals  Enc Vitals Group     BP 07/24/17 0423 108/76     Pulse Rate 07/24/17 0423 (!) 122     Resp 07/24/17 0423 18     Temp 07/24/17 0423 100.2 F (37.9 C)     Temp Source 07/24/17 0423 Oral     SpO2 07/24/17 0423 98 %     Weight 07/24/17 0424 142 lb (64.4 kg)     Height 07/24/17 0424  (1.727 m)     Head Circumference --      Peak Flow --      Pain Score 07/24/17 0422 10     Pain Loc --      Pain Edu? --      Excl. in GC? --     Constitutional: Alert and oriented. Uncomfortable appearing.  Eyes: Conjunctivae are normal. EOMI. PERRLA.  Head: Atraumatic. Nose: No congestion/rhinnorhea. Mouth/Throat: Mucous membranes are dry.  Neck: Normal range of motion.  Cardiovascular: Normal rate, regular rhythm. Grossly normal heart sounds.  Good peripheral circulation. Respiratory: Normal respiratory effort.  No retractions. Lungs CTAB. Gastrointestinal: Soft and nontender. No distention.  Genitourinary: No CVA tenderness. Musculoskeletal: No lower extremity edema.  Extremities warm and well perfused.  Neurologic:  Normal speech and language. No gross focal neurologic deficits are appreciated.  Skin:  Skin is warm and dry. No rash noted. Psychiatric: Mood and affect are normal. Speech and behavior are  normal.  ____________________________________________   LABS (all labs ordered are listed, but only abnormal results are displayed)  Labs Reviewed  BASIC METABOLIC PANEL - Abnormal; Notable for the following:       Result Value   Sodium 123 (*)    Potassium 5.6 (*)    Chloride 86 (*)    Glucose, Bld 488 (*)    Calcium 8.2 (*)    All other components within normal limits  CBC - Abnormal; Notable for the following:    WBC 17.9 (*)    All other components within normal limits  LACTIC ACID, PLASMA - Abnormal; Notable for the following:    Lactic Acid, Venous 4.1 (*)    All other components within normal limits  TROPONIN I - Abnormal; Notable for the following:    Troponin I 0.24 (*)    All other components within normal limits  GLUCOSE, CAPILLARY - Abnormal; Notable for the following:    Glucose-Capillary 497 (*)    All other components within normal limits  BLOOD GAS, VENOUS - Abnormal; Notable for the following:    pH, Ven 7.47 (*)    pCO2, Ven 35 (*)    pO2, Ven 55.0 (*)    Acid-Base Excess 2.2 (*)    All other components within normal limits  URINALYSIS, COMPLETE (UACMP) WITH MICROSCOPIC - Abnormal; Notable for the following:    Color, Urine YELLOW (*)    APPearance CLOUDY (*)    Glucose, UA >=500 (*)    Hgb urine dipstick SMALL (*)    Ketones, ur 5 (*)    Protein, ur 30 (*)    Nitrite POSITIVE (*)    Leukocytes, UA LARGE (*)    All other components within normal limits  CULTURE, BLOOD (ROUTINE X 2)  CULTURE, BLOOD (ROUTINE X 2)  LACTIC ACID, PLASMA  TROPONIN I  TROPONIN I  TROPONIN I  CBG MONITORING, ED   ____________________________________________  EKG  ED ECG REPORT I, Dionne Bucy, the attending physician, personally viewed and interpreted this ECG.  Date: 07/24/2017 EKG Time: 428 Rate: 117 Rhythm: sinus tachycardia QRS Axis: normal Intervals: left anterior fascicular block ST/T Wave abnormalities: normal Narrative Interpretation: no  evidence of acute ischemia; no significant change when compared to EKG of 07/15/2017  ____________________________________________  RADIOLOGY  Chest X ray with no acute infiltrate.   ____________________________________________   PROCEDURES  Procedure(s) performed: No    Critical Care performed: Yes  CRITICAL CARE Performed by: Dionne Bucy   Total critical care time: 30 minutes  Critical care time was exclusive of separately billable procedures and treating other patients.  Critical care was necessary to treat or prevent imminent or life-threatening deterioration.  Critical care was time spent personally by me on the following activities: development of treatment plan with patient and/or surrogate as well as nursing, discussions with consultants, evaluation of patient's response to treatment, examination of patient, obtaining history from patient or surrogate, ordering and performing treatments and interventions, ordering and review of laboratory  studies, ordering and review of radiographic studies, pulse oximetry and re-evaluation of patient's condition.  ____________________________________________   INITIAL IMPRESSION / ASSESSMENT AND PLAN / ED COURSE  Pertinent labs & imaging results that were available during my care of the patient were reviewed by me and considered in my medical decision making (see chart for details).  56 year old male with history of diabetes presents with hyperglycemia and fever x 1 week, associated with malaise, weakness, and back pain as well as urinary frequency.  Per girlfriend patient has not been taking his insulin for the last week. On exam, patient is tachycardic with low-grade temperature, and other vital signs are normal.  Exam is otherwise as described. presentation concerning for DKA, HHS, or hyperglycemia without these complications. Suspect most likely precipitating infection, most concerning for UTI but also consider pneumonia or  viral syndrome.  plan: Sepsis workup, fluids, insulin, and reassess. Plan for likely admission.    ----------------------------------------- 6:20 AM on 07/24/2017 -----------------------------------------  Lab workup confirms hyperglycemia but no evidence of DKA.  insulin given, as well as empiric broad-spectrum antibiotics. Patient is hyperkalemic but this should improve with insulin and he has no peaked T waves or other EKG changes.  Also no ischemic changes associated with the elevated troponin. Patient has no specific chest pain at this time. Will proceed with admission; signed out to hospitalist.  ____________________________________________   FINAL CLINICAL IMPRESSION(S) / ED DIAGNOSES  Final diagnoses:  Hyperglycemia  Sepsis, due to unspecified organism [facility])  Urinary tract infection without hematuria, site unspecified      NEW MEDICATIONS STARTED DURING THIS VISIT:  New Prescriptions   No medications on file     Note:  This document was prepared using Dragon voice recognition software and may include unintentional dictation errors.    Dionne Bucy, MD 07/24/17 [phone]

## 2017-07-24 NOTE — Progress Notes (Signed)
PHARMACY - PHYSICIAN COMMUNICATION CRITICAL VALUE ALERT - BLOOD CULTURE IDENTIFICATION (BCID)  Results for orders placed or performed during the hospital encounter of 07/24/17  Blood Culture ID Panel (Reflexed) (Collected: 07/24/2017  4:24 AM)  Result Value Ref Range   Enterococcus species NOT DETECTED NOT DETECTED   Listeria monocytogenes NOT DETECTED NOT DETECTED   Staphylococcus species DETECTED (A) NOT DETECTED   Staphylococcus aureus DETECTED (A) NOT DETECTED   Methicillin resistance NOT DETECTED NOT DETECTED   Streptococcus species NOT DETECTED NOT DETECTED   Streptococcus agalactiae NOT DETECTED NOT DETECTED   Streptococcus pneumoniae NOT DETECTED NOT DETECTED   Streptococcus pyogenes NOT DETECTED NOT DETECTED   Acinetobacter baumannii NOT DETECTED NOT DETECTED   Enterobacteriaceae species NOT DETECTED NOT DETECTED   Enterobacter cloacae complex NOT DETECTED NOT DETECTED   Escherichia coli NOT DETECTED NOT DETECTED   Klebsiella oxytoca NOT DETECTED NOT DETECTED   Klebsiella pneumoniae NOT DETECTED NOT DETECTED   Proteus species NOT DETECTED NOT DETECTED   Serratia marcescens NOT DETECTED NOT DETECTED   Haemophilus influenzae NOT DETECTED NOT DETECTED   Neisseria meningitidis NOT DETECTED NOT DETECTED   Pseudomonas aeruginosa NOT DETECTED NOT DETECTED   Candida albicans NOT DETECTED NOT DETECTED   Candida glabrata NOT DETECTED NOT DETECTED   Candida krusei NOT DETECTED NOT DETECTED   Candida parapsilosis NOT DETECTED NOT DETECTED   Candida tropicalis NOT DETECTED NOT DETECTED    Name of physician (or Provider) Contacted: Dr. Judithann Sheen   Changes to prescribed antibiotics required: Recommended switching to Cefazolin. MD would like to continue Zosyn.   Gardner Candle, PharmD, BCPS Clinical Pharmacist 07/24/2017 8:05 PM

## 2017-07-24 NOTE — Progress Notes (Addendum)
Admitted for sepsis, UTI. Started on IV antibiotics, IV fluids. Found to have elevated troponins up to 0.70. Patient denies any chest pain. No abdominal pain. No dysuria. According to wife patient stopped taking diabetes medicines for the past 1-2 weeks secondary to financial problems and not able to afford the medicines. Also having issues with urine stream for a while but patient denies any troubles. Patient also lost weight up to 40 pounds in 1 month according to wife he was 190 pounds month ago and 142 pounds on this admission.  Physical examination: Alert, awake, oriented. Vitals temperature  97.6, blood pressure 98/71. Heart rate 72. Oxygen saturation 100% on 2 L. Cardiovascular system: S1, S2 regular. Lungs: Clear to auscultation, no wheezes, no rales. Abdomen soft, nontender, nondistended. Neurological exam: No focal neurological deficit. Cranial nerves II through XII intact. Overall he appears ill.thin and appears older than  Stated age.  Lab data reviewed,   Assessment and plan. #1 sepsis secondary to UTI: On Zosyn, follow urine cultures, obtain ultrasound of bladder to evaluate for BPH. Lactic acid levels are coming down from 4.1 3.4. #2 hyponatremia secondary to dehydration: On IV fluids, replace sodium, recheck sodium. #3. hyperkalemia; likely due to sepsis: Patient received Kayexalate /recheck potassium. #4. diabetes mellitus type 2: Uncontrolled. Not  In DKA  on admission. Blood sugarmore than 300. Add Levemir. Now has lactic acidosis. Stop metformin, continue glipizide, continue systemic coverage, check hemoglobin A1c, consult diabetes coordinator. #5. unintentional weight loss. Patient needs EGD and colonoscopy as an outpatient once  sepsis improves.  #6..Elevated troponins likely due to demand ischemia from sepsis. Obtain full cardiac workup due to his risk factors of diabetes mellitus type 2, hyperlipidemia including echocardiogram cardiac to consider, started heparin,  Time  spent;35 min

## 2017-07-24 NOTE — Consult Note (Signed)
CARDIOLOGY CONSULT NOTE  Patient ID: Cameron Coffey MRN: [id number] DOB/AGE: Apr 01, 1961 56 y.o.  Admit date: 07/24/2017 Primary Physician Lorre Munroe, NP Primary Cardiologist New Chief Complaint  Fever. Requesting  Dr. Luberta Mutter  HPI:   The patient was admitted with sepsis probably with a urinary source.  He was thought to be dehydrated.  He has been managed for this and complications of hyponatremia and uncontrolled diabetes.  We are called because the troponins are elevated.  Troponin peaked at 0.7 and has almost normalized with the third lab.  EKG without acute ST T wave changes.  The patient has no past cardiac history or testing.  However, he has had poorly controlled DM.  He came to the hospital because he was having such profound weakness.  His girlfriend says that he has been week for weeks and spending much time in bed.  However, over the last day prior to admission he was unable to get up to the bathroom and he complained of hurting all over.   There has been no new shortness of breath, PND or orthopnea. There have been no reported palpitations, presyncope or syncope.  He does not localize any chest pain.     Past Medical History:  Diagnosis Date  . Diabetes mellitus without complication (HCC)   . Hyperlipidemia     Past Surgical History:  Procedure Laterality Date  . none      No Known Allergies Prescriptions Prior to Admission  Medication Sig Dispense Refill Last Dose  . glipiZIDE (GLUCOTROL) 10 MG tablet Take 1 tablet (10 mg total) by mouth 2 (two) times daily before a meal. 60 tablet 0   . metFORMIN (GLUCOPHAGE) 1000 MG tablet Take 1 tablet (1,000 mg total) by mouth 2 (two) times daily. 60 tablet 0   . lisinopril (PRINIVIL,ZESTRIL) 5 MG tablet Take 1 tablet (5 mg total) by mouth daily. 30 tablet 0   . simvastatin (ZOCOR) 40 MG tablet Take 1 tablet (40 mg total) by mouth daily. 30 tablet 0    Family History  Problem Relation Age of Onset  . Hyperlipidemia Mother     . Diabetes Mother   . Hyperlipidemia Father   . Diabetes Father     Social History   Social History  . Marital status: Divorced    Spouse name: N/A  . Number of children: N/A  . Years of education: N/A   Occupational History  . Not on file.   Social History Main Topics  . Smoking status: Former Smoker    Packs/day: 0.50    Years: 10.00    Types: Cigarettes  . Smokeless tobacco: Never Used  . Alcohol use 0.0 oz/week     Comment: weekend beer drinker  . Drug use: No  . Sexual activity: No   Other Topics Concern  . Not on file   Social History Narrative  . No narrative on file     ROS:    As stated in the HPI and negative for all other systems.  Physical Exam: Blood pressure 98/71, pulse 72, temperature 97.6 F (36.4 C), temperature source Oral, resp. rate 18, height  (1.727 m), weight 142 lb (64.4 kg), SpO2 100 %.  GENERAL:  Well appearing HEENT:  Pupils equal round and reactive, fundi not visualized, oral mucosa unremarkable NECK:  No jugular venous distention, waveform within normal limits, carotid upstroke brisk and symmetric, no bruits, no thyromegaly LYMPHATICS:  No cervical, inguinal adenopathy LUNGS:  Clear to auscultation bilaterally  BACK:  No CVA tenderness CHEST:  Unremarkable HEART:  PMI not displaced or sustained,S1 and S2 within normal limits, no S3, no S4, no clicks, no rubs, no murmurs ABD:  Flat, positive bowel sounds normal in frequency in pitch, no bruits, no rebound, no guarding, no midline pulsatile mass, no hepatomegaly, no splenomegaly EXT:  2 plus pulses throughout, no edema, no cyanosis no clubbing SKIN:  No rashes no nodules NEURO:  Cranial nerves II through XII grossly intact, motor grossly intact throughout PSYCH:  Cognitively intact, oriented to person place and time   Labs: Lab Results  Component Value Date   BUN 12 07/24/2017   Lab Results  Component Value Date   CREATININE 0.89 07/24/2017   Lab Results  Component Value  Date   NA 125 (L) 07/24/2017   K 3.9 07/24/2017   CL 92 (L) 07/24/2017   CO2 27 07/24/2017   Lab Results  Component Value Date   TROPONINI 0.04 (HH) 07/24/2017   Lab Results  Component Value Date   WBC 17.9 (H) 07/24/2017   HGB 15.4 07/24/2017   HCT 44.8 07/24/2017   MCV 92.9 07/24/2017   PLT 195 07/24/2017    Lab Results  Component Value Date   ALT 126 (H) 07/15/2017   AST 179 (H) 07/15/2017   ALKPHOS 137 (H) 07/15/2017   BILITOT 2.1 (H) 07/15/2017    Radiology:   CXR:  No acute abnormality.  No evidence of pneumonia.   EKG:   Sinus tach, rate 117, LAD, no acute ST T wave changes.  07/24/2017  ASSESSMENT AND PLAN:   ELEVATED TROPONIN:   I suspect that this is demand ischemia secondary to sepsis.  I would check an echo.  If this has no wall motion abnormalities then we could proceed with out patient risk stress testing.    RISK REDUCTION:  He certainly needs better BS control.  Check a lipid profile.  Should be discharge on statin.     SignedRollene Rotunda 07/24/2017, 2:11 PM

## 2017-07-25 ENCOUNTER — Encounter: Payer: Self-pay | Admitting: Radiology

## 2017-07-25 ENCOUNTER — Inpatient Hospital Stay: Payer: Self-pay

## 2017-07-25 DIAGNOSIS — N412 Abscess of prostate: Secondary | ICD-10-CM

## 2017-07-25 DIAGNOSIS — R011 Cardiac murmur, unspecified: Secondary | ICD-10-CM

## 2017-07-25 DIAGNOSIS — N401 Enlarged prostate with lower urinary tract symptoms: Secondary | ICD-10-CM

## 2017-07-25 DIAGNOSIS — R311 Benign essential microscopic hematuria: Secondary | ICD-10-CM

## 2017-07-25 LAB — HEPARIN LEVEL (UNFRACTIONATED): Heparin Unfractionated: <0.1 IU/mL — ABNORMAL LOW (ref 0.30–0.70)

## 2017-07-25 LAB — CBC
HCT: 42.4 % (ref 40.0–52.0)
HEMOGLOBIN: 14.5 g/dL (ref 13.0–18.0)
MCH: 31.7 pg (ref 26.0–34.0)
MCHC: 34.1 g/dL (ref 32.0–36.0)
MCV: 92.9 fL (ref 80.0–100.0)
Platelets: 153 10*3/uL (ref 150–440)
RBC: 4.56 MIL/uL (ref 4.40–5.90)
RDW: 14 % (ref 11.5–14.5)
WBC: 15.4 10*3/uL — AB (ref 3.8–10.6)

## 2017-07-25 LAB — GLUCOSE, CAPILLARY
GLUCOSE-CAPILLARY: 151 mg/dL — AB (ref 65–99)
Glucose-Capillary: 372 mg/dL — ABNORMAL HIGH (ref 65–99)
Glucose-Capillary: 298 mg/dL — ABNORMAL HIGH (ref 65–99)
Glucose-Capillary: 112 mg/dL — ABNORMAL HIGH (ref 65–99)

## 2017-07-25 LAB — HIV ANTIBODY (ROUTINE TESTING W REFLEX): HIV SCREEN 4TH GENERATION: NONREACTIVE

## 2017-07-25 LAB — LIPID PANEL
CHOLESTEROL: 72 mg/dL (ref 0–200)
HDL: <10 mg/dL — ABNORMAL LOW (ref >40)
TRIGLYCERIDES: 79 mg/dL (ref <150)
VLDL: 16 mg/dL (ref 0–40)

## 2017-07-25 LAB — BASIC METABOLIC PANEL
ANION GAP: 8 (ref 5–15)
BUN: 12 mg/dL (ref 6–20)
CALCIUM: 7.9 mg/dL — AB (ref 8.9–10.3)
CO2: 27 mmol/L (ref 22–32)
Chloride: 94 mmol/L — ABNORMAL LOW (ref 101–111)
Creatinine, Ser: 0.61 mg/dL (ref 0.61–1.24)
Glucose, Bld: 93 mg/dL (ref 65–99)
Potassium: 3.1 mmol/L — ABNORMAL LOW (ref 3.5–5.1)
SODIUM: 129 mmol/L — AB (ref 135–145)

## 2017-07-25 MED ORDER — IOPAMIDOL (ISOVUE-300) INJECTION 61%
125.0000 mL | Freq: Once | INTRAVENOUS | Status: AC | PRN
Start: 2017-07-25 — End: 2017-07-25
  Administered 2017-07-25: 150 mL via INTRAVENOUS

## 2017-07-25 MED ORDER — HEPARIN BOLUS VIA INFUSION
2000.0000 [IU] | Freq: Once | INTRAVENOUS | Status: AC
  Administered 2017-07-25: 2000 [IU] via INTRAVENOUS
  Filled 2017-07-25: qty 2000

## 2017-07-25 NOTE — Consult Note (Signed)
[facility] Infectious Disease     Reason for Consult: Bacteremia    Referring Physician: Governor Specking Date of Admission:  07/24/2017   Active Problems:   Sepsis [facility])   HPI: Cameron Coffey is a 56 y.o. male admitted with fever, chills and hyperglycemia. He also co dysuria. Has been feeling ill and losing wt for several weeks and had persistent hyperglycemia. On admit wbc was 17, temp 100.2, UA TNTC WBC. UA and BCX + Staph (MSSA).  Repeat bcx 9/16 remains +. He did report draining a facial abscess about 3 weeks ago.   Past Medical History:  Diagnosis Date  . Diabetes mellitus without complication [facility])    Past Surgical History:  Procedure Laterality Date  . none     Social History  Substance Use Topics  . Smoking status: Former Smoker    Packs/day: 0.50    Years: 10.00    Types: Cigarettes  . Smokeless tobacco: Never Used  . Alcohol use 0.0 oz/week     Comment: weekend beer drinker   Family History  Problem Relation Age of Onset  . Hyperlipidemia Mother   . Diabetes Mother   . Hyperlipidemia Father   . Diabetes Father     Allergies: No Known Allergies  Current antibiotics: Antibiotics Given (last 72 hours)    Date/Time Action Medication Dose Rate   07/24/17 0529 New Bag/Given   piperacillin-tazobactam (ZOSYN) IVPB 3.375 g 3.375 g 100 mL/hr   07/24/17 0542 New Bag/Given   vancomycin (VANCOCIN) IVPB 1000 mg/200 mL premix 1,000 mg 200 mL/hr   07/24/17 1252 New Bag/Given   piperacillin-tazobactam (ZOSYN) IVPB 3.375 g 3.375 g 12.5 mL/hr   07/24/17 2155 New Bag/Given   piperacillin-tazobactam (ZOSYN) IVPB 3.375 g 3.375 g 12.5 mL/hr   07/25/17 0536 New Bag/Given   piperacillin-tazobactam (ZOSYN) IVPB 3.375 g 3.375 g 12.5 mL/hr   07/25/17 1304 New Bag/Given   piperacillin-tazobactam (ZOSYN) IVPB 3.375 g 3.375 g 12.5 mL/hr   07/25/17 2301 New Bag/Given   piperacillin-tazobactam (ZOSYN) IVPB 3.375 g 3.375 g 12.5 mL/hr   07/26/17 0548 New Bag/Given    piperacillin-tazobactam (ZOSYN) IVPB 3.375 g 3.375 g 12.5 mL/hr   07/26/17 1256 New Bag/Given   piperacillin-tazobactam (ZOSYN) IVPB 3.375 g 3.375 g 12.5 mL/hr      MEDICATIONS: . aspirin  81 mg Oral Daily  . glipiZIDE  10 mg Oral BID AC  . insulin aspart  0-20 Units Subcutaneous TID WC  . insulin aspart  0-5 Units Subcutaneous QHS  . insulin glargine  20 Units Subcutaneous QHS  . insulin starter kit- syringes  1 kit Other Once  . lisinopril  5 mg Oral Daily  . metoprolol tartrate  12.5 mg Oral BID  . simvastatin  40 mg Oral Daily    Review of Systems - 11 systems reviewed and negative per HPI   OBJECTIVE: Temp:  [98.9 F (37.2 C)-100.7 F (38.2 C)] 99.3 F (37.4 C) (09/17 0655) Pulse Rate:  [69-110] 69 (09/17 1203) Resp:  [18] 18 (09/17 0915) BP: (97-110)/(63-73) 97/63 (09/17 1203) SpO2:  [97 %-100 %] 100 % (09/17 1203) Physical Exam  Constitutional: He is oriented to person, place, and time. Ill appearing HENT: anicteric  Mouth/Throat: Oropharynx is clear and dry . No oropharyngeal exudate.  Cardiovascular: Normal rate, regular rhythm and normal heart sounds. Pulmonary/Chest: Effort normal and breath sounds normal. No respiratory distress. He has no wheezes.  Abdominal: Soft. Bowel sounds are normal. He exhibits no distension. There is no tenderness. Lymphadenopathy:  He has no cervical adenopathy.  Neurological: He is alert and oriented to person, place, and time.  Skin: L upper back has an area of induration and mild ttp. No drainage.  Psychiatric: He has a normal mood and affect. His behavior is normal.     LABS: Results for orders placed or performed during the hospital encounter of 07/24/17 (from the past 48 hour(s))  Glucose, capillary     Status: Abnormal   Collection Time: 07/24/17  4:51 PM  Result Value Ref Range   Glucose-Capillary 359 (H) 65 - 99 mg/dL  Heparin level (unfractionated)     Status: Abnormal   Collection Time: 07/24/17  7:11 PM  Result Value  Ref Range   Heparin Unfractionated 0.20 (L) 0.30 - 0.70 IU/mL    Comment:        IF HEPARIN RESULTS ARE BELOW EXPECTED VALUES, AND PATIENT DOSAGE HAS BEEN CONFIRMED, SUGGEST FOLLOW UP TESTING OF ANTITHROMBIN III LEVELS.   Glucose, capillary     Status: Abnormal   Collection Time: 07/24/17  8:54 PM  Result Value Ref Range   Glucose-Capillary 135 (H) 65 - 99 mg/dL  Basic metabolic panel     Status: Abnormal   Collection Time: 07/25/17  3:08 AM  Result Value Ref Range   Sodium 129 (L) 135 - 145 mmol/L   Potassium 3.1 (L) 3.5 - 5.1 mmol/L   Chloride 94 (L) 101 - 111 mmol/L   CO2 27 22 - 32 mmol/L   Glucose, Bld 93 65 - 99 mg/dL   BUN 12 6 - 20 mg/dL   Creatinine, Ser 0.61 0.61 - 1.24 mg/dL   Calcium 7.9 (L) 8.9 - 10.3 mg/dL   GFR calc non Af Amer >60 >60 mL/min   GFR calc Af Amer >60 >60 mL/min    Comment: (NOTE) The eGFR has been calculated using the CKD EPI equation. This calculation has not been validated in all clinical situations. eGFR's persistently <60 mL/min signify possible Chronic Kidney Disease.    Anion gap 8 5 - 15  CBC     Status: Abnormal   Collection Time: 07/25/17  3:08 AM  Result Value Ref Range   WBC 15.4 (H) 3.8 - 10.6 K/uL   RBC 4.56 4.40 - 5.90 MIL/uL   Hemoglobin 14.5 13.0 - 18.0 g/dL   HCT 42.4 40.0 - 52.0 %   MCV 92.9 80.0 - 100.0 fL   MCH 31.7 26.0 - 34.0 pg   MCHC 34.1 32.0 - 36.0 g/dL   RDW 14.0 11.5 - 14.5 %   Platelets 153 150 - 440 K/uL  Heparin level (unfractionated)     Status: Abnormal   Collection Time: 07/25/17  3:08 AM  Result Value Ref Range   Heparin Unfractionated <0.10 (L) 0.30 - 0.70 IU/mL    Comment:        IF HEPARIN RESULTS ARE BELOW EXPECTED VALUES, AND PATIENT DOSAGE HAS BEEN CONFIRMED, SUGGEST FOLLOW UP TESTING OF ANTITHROMBIN III LEVELS.   Lipid panel     Status: Abnormal   Collection Time: 07/25/17  3:08 AM  Result Value Ref Range   Cholesterol 72 0 - 200 mg/dL   Triglycerides 79 <150 mg/dL   HDL <10 (L) >40  mg/dL   Total CHOL/HDL Ratio NOT CALCULATED RATIO   VLDL 16 0 - 40 mg/dL  Urine Culture     Status: Abnormal (Preliminary result)   Collection Time: 07/25/17  3:34 AM  Result Value Ref Range   Specimen Description URINE,  RANDOM    Special Requests NONE    Culture >=100,000 COLONIES/mL STAPHYLOCOCCUS AUREUS (A)    Report Status PENDING   Glucose, capillary     Status: Abnormal   Collection Time: 07/25/17  7:45 AM  Result Value Ref Range   Glucose-Capillary 151 (H) 65 - 99 mg/dL  Heparin level (unfractionated)     Status: Abnormal   Collection Time: 07/25/17  8:54 AM  Result Value Ref Range   Heparin Unfractionated <0.10 (L) 0.30 - 0.70 IU/mL    Comment:        IF HEPARIN RESULTS ARE BELOW EXPECTED VALUES, AND PATIENT DOSAGE HAS BEEN CONFIRMED, SUGGEST FOLLOW UP TESTING OF ANTITHROMBIN III LEVELS. RESULT REPEATED AND VERIFIED   Glucose, capillary     Status: Abnormal   Collection Time: 07/25/17 12:14 PM  Result Value Ref Range   Glucose-Capillary 372 (H) 65 - 99 mg/dL  Glucose, capillary     Status: Abnormal   Collection Time: 07/25/17  4:46 PM  Result Value Ref Range   Glucose-Capillary 298 (H) 65 - 99 mg/dL  Culture, blood (Routine X 2) w Reflex to ID Panel     Status: None (Preliminary result)   Collection Time: 07/25/17  5:17 PM  Result Value Ref Range   Specimen Description BLOOD RIGHT ANTECUBITAL    Special Requests      BOTTLES DRAWN AEROBIC AND ANAEROBIC Blood Culture adequate volume   Culture  Setup Time      GRAM POSITIVE COCCI ANAEROBIC BOTTLE ONLY CRITICAL RESULT CALLED TO, READ BACK BY AND VERIFIED WITH: KAREN HAYES AT 2376 ON 07/26/2017 JJB    Culture GRAM POSITIVE COCCI    Report Status PENDING   Heparin level (unfractionated)     Status: Abnormal   Collection Time: 07/25/17  5:18 PM  Result Value Ref Range   Heparin Unfractionated <0.10 (L) 0.30 - 0.70 IU/mL    Comment:        IF HEPARIN RESULTS ARE BELOW EXPECTED VALUES, AND PATIENT DOSAGE HAS BEEN  CONFIRMED, SUGGEST FOLLOW UP TESTING OF ANTITHROMBIN III LEVELS. RESULT REPEATED AND VERIFIED   Culture, blood (Routine X 2) w Reflex to ID Panel     Status: None (Preliminary result)   Collection Time: 07/25/17  5:25 PM  Result Value Ref Range   Specimen Description BLOOD LEFT ANTECUBITAL    Special Requests      BOTTLES DRAWN AEROBIC AND ANAEROBIC Blood Culture results may not be optimal due to an excessive volume of blood received in culture bottles   Culture NO GROWTH < 24 HOURS    Report Status PENDING   Glucose, capillary     Status: Abnormal   Collection Time: 07/25/17 10:20 PM  Result Value Ref Range   Glucose-Capillary 112 (H) 65 - 99 mg/dL  Heparin level (unfractionated)     Status: Abnormal   Collection Time: 07/26/17  1:53 AM  Result Value Ref Range   Heparin Unfractionated 0.12 (L) 0.30 - 0.70 IU/mL    Comment:        IF HEPARIN RESULTS ARE BELOW EXPECTED VALUES, AND PATIENT DOSAGE HAS BEEN CONFIRMED, SUGGEST FOLLOW UP TESTING OF ANTITHROMBIN III LEVELS.   APTT     Status: Abnormal   Collection Time: 07/26/17  1:53 AM  Result Value Ref Range   aPTT 58 (H) 24 - 36 seconds    Comment:        IF BASELINE aPTT IS ELEVATED, SUGGEST PATIENT RISK ASSESSMENT BE USED TO DETERMINE APPROPRIATE  ANTICOAGULANT THERAPY.   Glucose, capillary     Status: Abnormal   Collection Time: 07/26/17  7:25 AM  Result Value Ref Range   Glucose-Capillary 198 (H) 65 - 99 mg/dL  Heparin level (unfractionated)     Status: Abnormal   Collection Time: 07/26/17  7:52 AM  Result Value Ref Range   Heparin Unfractionated 0.11 (L) 0.30 - 0.70 IU/mL    Comment:        IF HEPARIN RESULTS ARE BELOW EXPECTED VALUES, AND PATIENT DOSAGE HAS BEEN CONFIRMED, SUGGEST FOLLOW UP TESTING OF ANTITHROMBIN III LEVELS.   Glucose, capillary     Status: Abnormal   Collection Time: 07/26/17 11:06 AM  Result Value Ref Range   Glucose-Capillary 301 (H) 65 - 99 mg/dL   No components found for: ESR, C  REACTIVE PROTEIN MICRO: Recent Results (from the past 720 hour(s))  Blood culture (routine x 2)     Status: Abnormal (Preliminary result)   Collection Time: 07/24/17  4:24 AM  Result Value Ref Range Status   Specimen Description BLOOD LEFT HAND  Final   Special Requests   Final    BOTTLES DRAWN AEROBIC AND ANAEROBIC Blood Culture adequate volume   Culture  Setup Time   Final    GRAM POSITIVE COCCI IN BOTH AEROBIC AND ANAEROBIC BOTTLES CRITICAL RESULT CALLED TO, READ BACK BY AND VERIFIED WITH: [facility] HALLAJI AT 1938 07/24/17.PMH    Culture STAPHYLOCOCCUS AUREUS (A)  Final   Report Status PENDING  Incomplete  Blood culture (routine x 2)     Status: Abnormal (Preliminary result)   Collection Time: 07/24/17  4:24 AM  Result Value Ref Range Status   Specimen Description BLOOD RIGHT HAND  Final   Special Requests   Final    BOTTLES DRAWN AEROBIC AND ANAEROBIC Blood Culture adequate volume   Culture  Setup Time   Final    GRAM POSITIVE COCCI IN BOTH AEROBIC AND ANAEROBIC BOTTLES CRITICAL RESULT CALLED TO, READ BACK BY AND VERIFIED WITH: [facility] HALLAJI AT 1938 07/24/17.PMH    Culture (A)  Final    STAPHYLOCOCCUS AUREUS SUSCEPTIBILITIES TO FOLLOW Performed at [facility], [facility] [address] [place], [place] [address]    Report Status PENDING  Incomplete  Blood Culture ID Panel (Reflexed)     Status: Abnormal   Collection Time: 07/24/17  4:24 AM  Result Value Ref Range Status   Enterococcus species NOT DETECTED NOT DETECTED Final   Listeria monocytogenes NOT DETECTED NOT DETECTED Final   Staphylococcus species DETECTED (A) NOT DETECTED Final    Comment: CRITICAL RESULT CALLED TO, READ BACK BY AND VERIFIED WITH: SHEEMA HALLAJI AT 1938 07/24/17.PMH    Staphylococcus aureus DETECTED (A) NOT DETECTED Final    Comment: Methicillin (oxacillin) susceptible Staphylococcus aureus (MSSA). Preferred therapy is anti staphylococcal beta lactam antibiotic (Cefazolin or Nafcillin), unless  clinically contraindicated. CRITICAL RESULT CALLED TO, READ BACK BY AND VERIFIED WITH: [facility] HALLAJI AT 1938 07/24/17.PMH    Methicillin resistance NOT DETECTED NOT DETECTED Final   Streptococcus species NOT DETECTED NOT DETECTED Final   Streptococcus agalactiae NOT DETECTED NOT DETECTED Final   Streptococcus pneumoniae NOT DETECTED NOT DETECTED Final   Streptococcus pyogenes NOT DETECTED NOT DETECTED Final   Acinetobacter baumannii NOT DETECTED NOT DETECTED Final   Enterobacteriaceae species NOT DETECTED NOT DETECTED Final   Enterobacter cloacae complex NOT DETECTED NOT DETECTED Final   Escherichia coli NOT DETECTED NOT DETECTED Final   Klebsiella oxytoca NOT DETECTED NOT DETECTED Final  Klebsiella pneumoniae NOT DETECTED NOT DETECTED Final   Proteus species NOT DETECTED NOT DETECTED Final   Serratia marcescens NOT DETECTED NOT DETECTED Final   Haemophilus influenzae NOT DETECTED NOT DETECTED Final   Neisseria meningitidis NOT DETECTED NOT DETECTED Final   Pseudomonas aeruginosa NOT DETECTED NOT DETECTED Final   Candida albicans NOT DETECTED NOT DETECTED Final   Candida glabrata NOT DETECTED NOT DETECTED Final   Candida krusei NOT DETECTED NOT DETECTED Final   Candida parapsilosis NOT DETECTED NOT DETECTED Final   Candida tropicalis NOT DETECTED NOT DETECTED Final  Urine Culture     Status: Abnormal (Preliminary result)   Collection Time: 07/25/17  3:34 AM  Result Value Ref Range Status   Specimen Description URINE, RANDOM  Final   Special Requests NONE  Final   Culture >=100,000 COLONIES/mL STAPHYLOCOCCUS AUREUS (A)  Final   Report Status PENDING  Incomplete  Culture, blood (Routine X 2) w Reflex to ID Panel     Status: None (Preliminary result)   Collection Time: 07/25/17  5:17 PM  Result Value Ref Range Status   Specimen Description BLOOD RIGHT ANTECUBITAL  Final   Special Requests   Final    BOTTLES DRAWN AEROBIC AND ANAEROBIC Blood Culture adequate volume   Culture  Setup  Time   Final    GRAM POSITIVE COCCI ANAEROBIC BOTTLE ONLY CRITICAL RESULT CALLED TO, READ BACK BY AND VERIFIED WITH: KAREN HAYES AT 3382 ON 07/26/2017 JJB    Culture GRAM POSITIVE COCCI  Final   Report Status PENDING  Incomplete  Culture, blood (Routine X 2) w Reflex to ID Panel     Status: None (Preliminary result)   Collection Time: 07/25/17  5:25 PM  Result Value Ref Range Status   Specimen Description BLOOD LEFT ANTECUBITAL  Final   Special Requests   Final    BOTTLES DRAWN AEROBIC AND ANAEROBIC Blood Culture results may not be optimal due to an excessive volume of blood received in culture bottles   Culture NO GROWTH < 24 HOURS  Final   Report Status PENDING  Incomplete    IMAGING: Ct Abdomen Pelvis W Wo Contrast  Result Date: 07/26/2017 CLINICAL DATA:  Hematuria, weight loss, abdominal pain. Urinary tract infection. EXAM: CT ABDOMEN AND PELVIS WITHOUT AND WITH CONTRAST TECHNIQUE: Multidetector CT imaging of the abdomen and pelvis was performed following the standard protocol before and following the bolus administration of intravenous contrast. CONTRAST:  120m ISOVUE-300 IOPAMIDOL (ISOVUE-300) INJECTION 61% COMPARISON:  None. FINDINGS: Lower chest: 8 mm nodule seen in the right middle lobe. Image quality is degraded by respiratory motion. Small left pleural effusion collapse/ consolidation in the left lower lobe. Heart size normal. No pericardial effusion. Distal esophagus is grossly unremarkable. Hepatobiliary: Liver margin is markedly irregular. Stones are seen in the gallbladder. No biliary ductal dilatation. Pancreas: Negative. Spleen: Negative. Adrenals/Urinary Tract: Adrenal glands are unremarkable. Stones are seen in the kidneys bilaterally. There is a striated appearance in both kidneys with areas of ill-defined low attenuation on portal venous phase imaging. 1.3 cm low-attenuation lesion is seen in the interpolar left kidney (series 8, image 31), too small to characterize. On  delayed imaging there is low attenuation material in the nondependent portion of the bladder. Stomach/Bowel: Stomach, small bowel, appendix and colon are grossly unremarkable. Vascular/Lymphatic: Atherosclerotic calcification of the arterial vasculature without abdominal aortic aneurysm. Borderline enlarged porta hepatis lymph nodes, likely reactive. Reproductive: Prostate is enlarged and contains a large area of low-attenuation, measuring approximately 3.9 x  4.1 cm. Other: Moderate ascites.  Mild presacral edema. Musculoskeletal: Diffuse body wall edema. There are multiple fluid collections within the chest wall musculature, some of which are best seen on nephrographic phase imaging and some of which have peripheral high attenuation on portal venous phase imaging. Index collection in the left lateral chest wall measures 3.3 cm (incompletely imaged, series 5, image 1). A 1.3 cm low-attenuation collection with peripheral high attenuation is seen in the high right thigh musculature (series 5, image 76). No worrisome lytic or sclerotic lesions. IMPRESSION: 1. Organized fluid collections within the prostate and soft tissue musculature, highly worrisome for abscesses. 2. Striated appearance of both kidneys, most indicative of pyelonephritis. Difficult to exclude developing abscesses. 3. Debris in the bladder.  Difficult to exclude a mass. 4. Right middle lobe nodule. Metastatic disease is not excluded. Continued attention on follow-up is recommended. 5. Cirrhosis with moderate ascites. 6. Small left pleural effusion with collapse/consolidation in the left lower lobe. 7. Bilateral renal stones. 8. Cholelithiasis. 9.  Aortic atherosclerosis (ICD10-170.0). Electronically Signed   By: Lorin Picket M.D.   On: 07/26/2017 08:03   Dg Chest 2 View  Result Date: 07/15/2017 CLINICAL DATA:  Weight loss.  Diabetes. EXAM: CHEST  2 VIEW COMPARISON:  None. FINDINGS: The heart size and mediastinal contours are within normal limits.  Both lungs are clear. The visualized skeletal structures are unremarkable. IMPRESSION: No active cardiopulmonary disease. Electronically Signed   By: Van Clines M.D.   On: 07/15/2017 13:42   US Pelvis Limited (transabdominal Only)  Result Date: 07/25/2017 CLINICAL DATA:  Urinary retention and possible benign prostatic hypertrophy. EXAM: LIMITED ULTRASOUND OF PELVIS TECHNIQUE: Limited transabdominal ultrasound examination of the pelvis was performed. COMPARISON:  None. FINDINGS: Posterior urinary bladder wall mass greater than expected for the inter your icteric ridge shown for example on image 13, there is a 5.9 by 1.0 by 5.1 cm in thickness. Transitional cell carcinoma is a distinct possibility. The the prostate is moderately enlarged, volume calculated at 69 cubic cm. Calcifications in the prostate parenchyma are observed. Prevoid bladder volume was 146 cubic cm. Postvoid volume was 38 cubic cm. IMPRESSION: 1. Possible posterior bladder wall mass, for example image 13, transitional cell carcinoma is not excluded and cystoscopy is recommended. 2. Moderate enlargement the prostate gland at 69 cubic cm. Electronically Signed   By: Van Clines M.D.   On: 07/25/2017 12:19   Dg Chest Port 1 View  Result Date: 07/24/2017 CLINICAL DATA:  Sepsis, hypoxia, rule out pneumonia. EXAM: PORTABLE CHEST 1 VIEW COMPARISON:  Chest radiograph 07/15/2017 FINDINGS: The cardiomediastinal contours are normal. The lungs are clear. Pulmonary vasculature is normal. No consolidation, pleural effusion, or pneumothorax. No acute osseous abnormalities are seen. IMPRESSION: No acute abnormality.  No evidence of pneumonia. Electronically Signed   By: Jeb Levering M.D.   On: 07/24/2017 05:03   Study Conclusions  - Left ventricle: The cavity size was normal. There was moderate   concentric hypertrophy. Systolic function was normal. The   estimated ejection fraction was in the range of 60% to 65%. Wall   motion was  normal; there were no regional wall motion   abnormalities. Doppler parameters are consistent with abnormal   left ventricular relaxation (grade 1 diastolic dysfunction). - Pericardium, extracardiac: A trivial pericardial effusion was   identified posterior to the heart.  Assessment:   Cameron Coffey is a 56 y.o. male admitted with fevers and weakness as well as dysuria. On admit elevated lactic acid,  dhydration, and wbc 17 and febirle to 100.2. Has had a general decline in last few weeks as well. BCX + MSSA.  CXR neg, Pelvic USS with possible bladder mass or inflammation. UA On admit with TNTC WBC. UCX Staph.  CT scan with likley prostate abscess, possible pyelo, multiple soft tissue abscesse as well as RML nodule and cirrhosis with mod ascites. Has renal stones as well.  L upper back with area of swelling, had recent L facial abscess he self drained High risk of endocarditis and seeding of multiple sites. However TTE negative and good images so can likely avoid TEE at this point.   Will need to monitor for need to drain abscesses.  Recommendations Can change to high dose ancef Repeat bcx as fu 9/16 was +.  Consider TEE if worsens Will review CT and see if needs drainage of abscesses in musculature Will need 6 weeks IV abx but will hold on picc placement until bcx neg 72 hours.  Thank you very much for allowing me to participate in the care of this patient. Please call with questions.   Cheral Marker. Ola Spurr, MD

## 2017-07-25 NOTE — Progress Notes (Signed)
ID brief note Reviewed cultures + for MSSA.  Will need repeat bcx and echo. I have ordered. Once bcx neg x 48 hours will need picc placed and 2-4 weeks IV ancef I will see patient tomorrow.

## 2017-07-25 NOTE — Progress Notes (Signed)
ANTICOAGULATION CONSULT NOTE - Initial Consult  Pharmacy Consult for heparin  Indication: chest pain/ACS  No Known Allergies  Patient Measurements: Height:  (172.7 cm) Weight: 142 lb (64.4 kg) IBW/kg (Calculated) : 68.4 Heparin Dosing Weight: 64.4kg  Vital Signs: Temp: 98.5 F (36.9 C) (09/16 1213) Temp Source: Oral (09/16 1213) BP: 93/58 (09/16 1213) Pulse Rate: 79 (09/16 1213)  Labs:  Recent Labs  07/24/17 0423 07/24/17 1012 07/24/17 1223  07/25/17 0308 07/25/17 0854 07/25/17 1718  HGB 15.4  --   --   --  14.5  --   --   HCT 44.8  --   --   --  42.4  --   --   PLT 195  --   --   --  153  --   --   APTT  --   --  31  --   --   --   --   LABPROT  --   --  17.1*  --   --   --   --   INR  --   --  1.41  --   --   --   --   HEPARINUNFRC  --   --   --   < > <0.10* <0.10* <0.10*  CREATININE 0.95 0.89  --   --  0.61  --   --   TROPONINI 0.24* 0.70* 0.04*  --   --   --   --   < > = values in this interval not displayed.  Estimated Creatinine Clearance: 95 mL/min (by C-G formula based on SCr of 0.61 mg/dL).   Medical History: Past Medical History:  Diagnosis Date  . Diabetes mellitus without complication (HCC)     Medications:    Assessment:  56yo male presents with chest pain and elevated troponin. Pharmacy was consulted for heparin drip.   Goal of Therapy:  Heparin level 0.3-0.7 units/ml Monitor platelets by anticoagulation protocol: Yes   Plan:  Give 3900 units bolus x 1  Will initiate heparin drip at 750 units/hr. Will check HL 6 hours after heparin drip started. Pharmacy will continue to monitor and adjust as needed.    9/15 1911 HL subtherapeutic at 0.20. Will order 900unit bolus and increase heparin infusion to 900units/hr. Recheck heparin level in 6 hours.   9/16 @ 0300 HL < 0.10 subtherapeutic. Will rebolus w/ heparin 2000 units IV x 1 and will increase rate to 1050 units/hr and will recheck HL @ 0900. CBC appears stable.  9/16 @ HL <0.10  subtherpeutic. Will rebolus with 2000 units of heparin and will increase heparin gtt to 1250 units/hr and will recheck Heparin level @ 1700.   9/16 @ 1718 HL <0.10 suptherapeutic. Will bolus with 2000 units heparin and increase heparin infusion to 1500 units/hr. Will recheck HL and check aPTT in 6 hours.   Gardner Candle, PharmD, BCPS Clinical Pharmacist 07/25/2017 6:22 PM

## 2017-07-25 NOTE — Progress Notes (Signed)
Progress Note  Patient Name: Cameron Coffey Date of Encounter: 07/25/2017  Primary Cardiologist: New  Subjective   He is not having chest pain.  He has pain on his back under both shoulder blades.    Inpatient Medications    Scheduled Meds: . aspirin  81 mg Oral Daily  . glipiZIDE  10 mg Oral BID AC  . Influenza vac split quadrivalent PF  0.5 mL Intramuscular Tomorrow-1000  . insulin aspart  0-20 Units Subcutaneous TID WC  . insulin aspart  0-5 Units Subcutaneous QHS  . insulin glargine  20 Units Subcutaneous QHS  . lisinopril  5 mg Oral Daily  . metoprolol tartrate  12.5 mg Oral BID  . pneumococcal 23 valent vaccine  0.5 mL Intramuscular Tomorrow-1000  . simvastatin  40 mg Oral Daily   Continuous Infusions: . sodium chloride 75 mL/hr at 07/25/17 0905  . heparin 1,250 Units/hr (07/25/17 1105)  . piperacillin-tazobactam (ZOSYN)  IV 3.375 g (07/25/17 1304)   PRN Meds: acetaminophen **OR** acetaminophen, HYDROcodone-acetaminophen, ondansetron **OR** ondansetron (ZOFRAN) IV, senna-docusate   Vital Signs    Vitals:   07/25/17 0826 07/25/17 1129 07/25/17 1130 07/25/17 1213  BP: 111/69 109/71 109/71 (!) 93/58  Pulse: 98 91  79  Resp: 16   19  Temp: 98.5 F (36.9 C)   98.5 F (36.9 C)  TempSrc: Oral   Oral  SpO2: 97%   97%  Weight:      Height:        Intake/Output Summary (Last 24 hours) at 07/25/17 1331 Last data filed at 07/25/17 1300  Gross per 24 hour  Intake          2858.71 ml  Output             1175 ml  Net          1683.71 ml   Filed Weights   07/24/17 0424  Weight: 142 lb (64.4 kg)    Telemetry    NSR - Personally Reviewed  ECG    NA - Personally Reviewed  Physical Exam   GEN: No acute distress.   Neck: No  JVD Cardiac: RRR, 2/6 apical systolic murmur short and at the apex only, no diastolic murmurs, rubs, or gallops.  Respiratory: Clear  to auscultation bilaterally. GI: Soft, nontender, non-distended  MS: No  edema; No deformity.   There is a non tender firm swelling under the right shoulder blade.  Neuro:  Nonfocal  Psych: Normal affect   Labs    Chemistry Recent Labs Lab 07/24/17 0423 07/24/17 1012 07/24/17 1223 07/25/17 0308  NA 123* 125*  --  129*  K 5.6* 3.6 3.9 3.1*  CL 86* 92*  --  94*  CO2 25 27  --  27  GLUCOSE 488* 422*  --  93  BUN 10 12  --  12  CREATININE 0.95 0.89  --  0.61  CALCIUM 8.2* 7.9*  --  7.9*  GFRNONAA >60 >60  --  >60  GFRAA >60 >60  --  >60  ANIONGAP 12 6  --  8     Hematology Recent Labs Lab 07/24/17 0423 07/25/17 0308  WBC 17.9* 15.4*  RBC 4.83 4.56  HGB 15.4 14.5  HCT 44.8 42.4  MCV 92.9 92.9  MCH 31.9 31.7  MCHC 34.4 34.1  RDW 14.2 14.0  PLT 195 153    Cardiac Enzymes Recent Labs Lab 07/24/17 0423 07/24/17 1012 07/24/17 1223  TROPONINI 0.24* 0.70* 0.04*  No results for input(s): TROPIPOC in the last 168 hours.   BNPNo results for input(s): BNP, PROBNP in the last 168 hours.   DDimer No results for input(s): DDIMER in the last 168 hours.   Radiology    US Pelvis Limited (transabdominal Only)  Result Date: 07/25/2017 CLINICAL DATA:  Urinary retention and possible benign prostatic hypertrophy. EXAM: LIMITED ULTRASOUND OF PELVIS TECHNIQUE: Limited transabdominal ultrasound examination of the pelvis was performed. COMPARISON:  None. FINDINGS: Posterior urinary bladder wall mass greater than expected for the inter your icteric ridge shown for example on image 13, there is a 5.9 by 1.0 by 5.1 cm in thickness. Transitional cell carcinoma is a distinct possibility. The the prostate is moderately enlarged, volume calculated at 69 cubic cm. Calcifications in the prostate parenchyma are observed. Prevoid bladder volume was 146 cubic cm. Postvoid volume was 38 cubic cm. IMPRESSION: 1. Possible posterior bladder wall mass, for example image 13, transitional cell carcinoma is not excluded and cystoscopy is recommended. 2. Moderate enlargement the prostate gland at 69  cubic cm. Electronically Signed   By: Gaylyn Rong M.D.   On: 07/25/2017 12:19   Dg Chest Port 1 View  Result Date: 07/24/2017 CLINICAL DATA:  Sepsis, hypoxia, rule out pneumonia. EXAM: PORTABLE CHEST 1 VIEW COMPARISON:  Chest radiograph 07/15/2017 FINDINGS: The cardiomediastinal contours are normal. The lungs are clear. Pulmonary vasculature is normal. No consolidation, pleural effusion, or pneumothorax. No acute osseous abnormalities are seen. IMPRESSION: No acute abnormality.  No evidence of pneumonia. Electronically Signed   By: Rubye Oaks M.D.   On: 07/24/2017 05:03    Cardiac Studies   ECHO:  Ordered  Patient Profile     56 y.o. male with sepsis and uncontrolled DM.  We were consulted with an elevated troponin.  No past cardiac history.  Assessment & Plan    ELEVATED TROPONIN:   Probably demand ischemia.  Echo has been ordered.  OK to stop heparin.  If echo OK I would suggest an out patient POET (Plain Old Exercise Treadmill) when he is able to walk.   MURMUR:  I suspect aortic sclerosis although he could have a bicuspid AoV.  Echo to evaluate.  BACK PAIN:  Swelling on his back feels muscular.  Continue conservative management and imaging per the primary team if this continues.    STAPH BACTEREMIA:  MSSA.  Low suspicion for cardiac involvement.  Echo as above.  This takes on added importance with the murmur above.  However, he has no physical manifestations of SBE.   Signed, Rollene Rotunda, MD  07/25/2017, 1:31 PM

## 2017-07-25 NOTE — Consult Note (Addendum)
Consult: bladder mass Requested by: Dr. Suzanne Boron  History of Present Illness:  56 yo male  [name] admitted with fever and chills and dysuria. His UA showed too numerous to count white cells, 6-30 red cells and no bacteria. I did not see the urine culture was initially sent, however his blood cultures have grown staph aureus and he is being treated for that. His white count today down to 15 and creatinine 0.61. He underwent a pelvic ultrasound which revealed a 69 g prostate, post void residual of 38 mL and a thickened bladder wall particularly along the trigone. The trigonal thickness measured 5.9 x 5.1 cm and it was thought to possibly even represent a bladder mass. I reviewed all the images.   The patient denies any prior urologic history. Prior to this episode he voided with a good stream and no frequency or urgency. He said no gross hematuria. He does note about 2 months of progressive weight loss sugars up to "900". He was a [employer]. NG risk includes DM.    Past Medical History:  Diagnosis Date  . Diabetes mellitus without complication [facility])    Past Surgical History:  Procedure Laterality Date  . none      Home Medications:  Prescriptions Prior to Admission  Medication Sig Dispense Refill Last Dose  . glipiZIDE (GLUCOTROL) 10 MG tablet Take 1 tablet (10 mg total) by mouth 2 (two) times daily before a meal. 60 tablet 0   . metFORMIN (GLUCOPHAGE) 1000 MG tablet Take 1 tablet (1,000 mg total) by mouth 2 (two) times daily. 60 tablet 0   . lisinopril (PRINIVIL,ZESTRIL) 5 MG tablet Take 1 tablet (5 mg total) by mouth daily. 30 tablet 0   . simvastatin (ZOCOR) 40 MG tablet Take 1 tablet (40 mg total) by mouth daily. 30 tablet 0    Allergies: No Known Allergies  Family History  Problem Relation Age of Onset  . Hyperlipidemia Mother   . Diabetes Mother   . Hyperlipidemia Father   . Diabetes Father    Social History:  reports that he has quit smoking. His smoking  use included Cigarettes. He has a 5.00 pack-year smoking history. He has never used smokeless tobacco. He reports that he drinks alcohol. He reports that he does not use drugs.  ROS: A complete review of systems was performed.  All systems are negative except for pertinent findings as noted. Review of Systems  All other systems reviewed and are negative.    Physical Exam:  Vital signs in last 24 hours: Temp:  [98.5 F (36.9 C)-100.5 F (38.1 C)] 98.5 F (36.9 C) (09/16 1213) Pulse Rate:  [79-107] 79 (09/16 1213) Resp:  [16-19] 19 (09/16 1213) BP: (93-127)/(58-76) 93/58 (09/16 1213) SpO2:  [97 %-100 %] 97 % (09/16 1213) General:  Alert and oriented, No acute distress, thin HEENT: Normocephalic, atraumatic Cardiovascular: Regular rate and rhythm Lungs: Regular rate and effort Abdomen: Soft, nontender, nondistended, no abdominal masses Back: No CVA tenderness Extremities: No edema or pain  Neurologic: Grossly intact  Laboratory Data:  Results for orders placed or performed during the hospital encounter of 07/24/17 (from the past 24 hour(s))  Glucose, capillary     Status: Abnormal   Collection Time: 07/24/17  4:51 PM  Result Value Ref Range   Glucose-Capillary 359 (H) 65 - 99 mg/dL  Heparin level (unfractionated)     Status: Abnormal   Collection Time: 07/24/17  7:11 PM  Result Value Ref Range   Heparin  Unfractionated 0.20 (L) 0.30 - 0.70 IU/mL  Glucose, capillary     Status: Abnormal   Collection Time: 07/24/17  8:54 PM  Result Value Ref Range   Glucose-Capillary 135 (H) 65 - 99 mg/dL  Basic metabolic panel     Status: Abnormal   Collection Time: 07/25/17  3:08 AM  Result Value Ref Range   Sodium 129 (L) 135 - 145 mmol/L   Potassium 3.1 (L) 3.5 - 5.1 mmol/L   Chloride 94 (L) 101 - 111 mmol/L   CO2 27 22 - 32 mmol/L   Glucose, Bld 93 65 - 99 mg/dL   BUN 12 6 - 20 mg/dL   Creatinine, Ser 1.61 0.61 - 1.24 mg/dL   Calcium 7.9 (L) 8.9 - 10.3 mg/dL   GFR calc non Af Amer  >60 >60 mL/min   GFR calc Af Amer >60 >60 mL/min   Anion gap 8 5 - 15  CBC     Status: Abnormal   Collection Time: 07/25/17  3:08 AM  Result Value Ref Range   WBC 15.4 (H) 3.8 - 10.6 K/uL   RBC 4.56 4.40 - 5.90 MIL/uL   Hemoglobin 14.5 13.0 - 18.0 g/dL   HCT 09.6 04.5 - 40.9 %   MCV 92.9 80.0 - 100.0 fL   MCH 31.7 26.0 - 34.0 pg   MCHC 34.1 32.0 - 36.0 g/dL   RDW 81.1 91.4 - 78.2 %   Platelets 153 150 - 440 K/uL  Heparin level (unfractionated)     Status: Abnormal   Collection Time: 07/25/17  3:08 AM  Result Value Ref Range   Heparin Unfractionated <0.10 (L) 0.30 - 0.70 IU/mL  Lipid panel     Status: Abnormal   Collection Time: 07/25/17  3:08 AM  Result Value Ref Range   Cholesterol 72 0 - 200 mg/dL   Triglycerides 79 <956 mg/dL   HDL <21 (L) >30 mg/dL   Total CHOL/HDL Ratio NOT CALCULATED RATIO   VLDL 16 0 - 40 mg/dL  Glucose, capillary     Status: Abnormal   Collection Time: 07/25/17  7:45 AM  Result Value Ref Range   Glucose-Capillary 151 (H) 65 - 99 mg/dL  Heparin level (unfractionated)     Status: Abnormal   Collection Time: 07/25/17  8:54 AM  Result Value Ref Range   Heparin Unfractionated <0.10 (L) 0.30 - 0.70 IU/mL  Glucose, capillary     Status: Abnormal   Collection Time: 07/25/17 12:14 PM  Result Value Ref Range   Glucose-Capillary 372 (H) 65 - 99 mg/dL   Recent Results (from the past 240 hour(s))  Blood culture (routine x 2)     Status: None (Preliminary result)   Collection Time: 07/24/17  4:24 AM  Result Value Ref Range Status   Specimen Description BLOOD LEFT HAND  Final   Special Requests   Final    BOTTLES DRAWN AEROBIC AND ANAEROBIC Blood Culture adequate volume   Culture  Setup Time   Final    GRAM POSITIVE COCCI IN BOTH AEROBIC AND ANAEROBIC BOTTLES CRITICAL RESULT CALLED TO, READ BACK BY AND VERIFIED WITH: SHEEMA HALLAJI AT 1938 07/24/17.PMH    Culture GRAM POSITIVE COCCI  Final   Report Status PENDING  Incomplete  Blood culture (routine x 2)      Status: None (Preliminary result)   Collection Time: 07/24/17  4:24 AM  Result Value Ref Range Status   Specimen Description BLOOD RIGHT HAND  Final   Special Requests  Final    BOTTLES DRAWN AEROBIC AND ANAEROBIC Blood Culture adequate volume   Culture  Setup Time   Final    Organism ID to follow GRAM POSITIVE COCCI IN BOTH AEROBIC AND ANAEROBIC BOTTLES CRITICAL RESULT CALLED TO, READ BACK BY AND VERIFIED WITH: SHEEMA HALLAJI AT 1938 07/24/17.PMH    Culture GRAM POSITIVE COCCI  Final   Report Status PENDING  Incomplete  Blood Culture ID Panel (Reflexed)     Status: Abnormal   Collection Time: 07/24/17  4:24 AM  Result Value Ref Range Status   Enterococcus species NOT DETECTED NOT DETECTED Final   Listeria monocytogenes NOT DETECTED NOT DETECTED Final   Staphylococcus species DETECTED (A) NOT DETECTED Final    Comment: CRITICAL RESULT CALLED TO, READ BACK BY AND VERIFIED WITH: SHEEMA HALLAJI AT 1938 07/24/17.PMH    Staphylococcus aureus DETECTED (A) NOT DETECTED Final    Comment: Methicillin (oxacillin) susceptible Staphylococcus aureus (MSSA). Preferred therapy is anti staphylococcal beta lactam antibiotic (Cefazolin or Nafcillin), unless clinically contraindicated. CRITICAL RESULT CALLED TO, READ BACK BY AND VERIFIED WITH: [facility] HALLAJI AT 1938 07/24/17.PMH    Methicillin resistance NOT DETECTED NOT DETECTED Final   Streptococcus species NOT DETECTED NOT DETECTED Final   Streptococcus agalactiae NOT DETECTED NOT DETECTED Final   Streptococcus pneumoniae NOT DETECTED NOT DETECTED Final   Streptococcus pyogenes NOT DETECTED NOT DETECTED Final   Acinetobacter baumannii NOT DETECTED NOT DETECTED Final   Enterobacteriaceae species NOT DETECTED NOT DETECTED Final   Enterobacter cloacae complex NOT DETECTED NOT DETECTED Final   Escherichia coli NOT DETECTED NOT DETECTED Final   Klebsiella oxytoca NOT DETECTED NOT DETECTED Final   Klebsiella pneumoniae NOT DETECTED NOT DETECTED  Final   Proteus species NOT DETECTED NOT DETECTED Final   Serratia marcescens NOT DETECTED NOT DETECTED Final   Haemophilus influenzae NOT DETECTED NOT DETECTED Final   Neisseria meningitidis NOT DETECTED NOT DETECTED Final   Pseudomonas aeruginosa NOT DETECTED NOT DETECTED Final   Candida albicans NOT DETECTED NOT DETECTED Final   Candida glabrata NOT DETECTED NOT DETECTED Final   Candida krusei NOT DETECTED NOT DETECTED Final   Candida parapsilosis NOT DETECTED NOT DETECTED Final   Candida tropicalis NOT DETECTED NOT DETECTED Final   Creatinine:  Recent Labs  07/24/17 0423 07/24/17 1012 07/25/17 0308  CREATININE 0.95 0.89 0.61    Impression/Assessment/plan: 1) bladder wall thickening versus bladder mass-I discussed with the patient and his girlfriend the ultrasound findings and the importance of outpatient cystoscopy. We discussed these findings could be related to bladder wall inflammation from his infection or could be more sinister (a bladder mass or bladder cancer). We discussed the nature risks and benefits of cystoscopy and I sent a message for follow-up. My thought is that this is simply bladder wall thickening because that appears symmetric along the trigone or bladder base and the remainder of the bladder appears somewhat thickened.  2) microscopic hematuria, pyuria-this may be related to UTI but it is interesting that his UA showed no bacteria. Also he has had progressive malaise and weight loss over the past 2 months although this may be related to uncontrolled diabetes. We discussed the nature risks and benefits of screening the urinary tract with an ultrasound versus a CT scan with IV contrast and the patient elected to proceed with CT.  3) BPH - the patient has no voiding complaints. He typically voids with a good stream and without irritative voiding symptoms. His post void was normal, so the BPH can  be monitored. However, I did recommend he follow-up in the office for  prostate cancer screening with an exam and a PSA once this acute infection resolves.  Will follow.   Cameron Coffey 07/25/2017, 2:52 PM

## 2017-07-25 NOTE — Progress Notes (Signed)
ANTICOAGULATION CONSULT NOTE - Initial Consult  Pharmacy Consult for heparin  Indication: chest pain/ACS  No Known Allergies  Patient Measurements: Height:  (172.7 cm) Weight: 142 lb (64.4 kg) IBW/kg (Calculated) : 68.4 Heparin Dosing Weight: 64.4kg  Vital Signs: Temp: 98.5 F (36.9 C) (09/16 0826) Temp Source: Oral (09/16 0826) BP: 111/69 (09/16 0826) Pulse Rate: 98 (09/16 0826)  Labs:  Recent Labs  07/24/17 0423 07/24/17 1012 07/24/17 1223 07/24/17 1911 07/25/17 0308 07/25/17 0854  HGB 15.4  --   --   --  14.5  --   HCT 44.8  --   --   --  42.4  --   PLT 195  --   --   --  153  --   APTT  --   --  31  --   --   --   LABPROT  --   --  17.1*  --   --   --   INR  --   --  1.41  --   --   --   HEPARINUNFRC  --   --   --  0.20* <0.10* <0.10*  CREATININE 0.95 0.89  --   --  0.61  --   TROPONINI 0.24* 0.70* 0.04*  --   --   --     Estimated Creatinine Clearance: 95 mL/min (by C-G formula based on SCr of 0.61 mg/dL).   Medical History: Past Medical History:  Diagnosis Date  . Diabetes mellitus without complication (HCC)     Medications:    Assessment:  56yo male presents with chest pain and elevated troponin. Pharmacy was consulted for heparin drip.   Goal of Therapy:  Heparin level 0.3-0.7 units/ml Monitor platelets by anticoagulation protocol: Yes   Plan:  Give 3900 units bolus x 1  Will initiate heparin drip at 750 units/hr. Will check HL 6 hours after heparin drip started. Pharmacy will continue to monitor and adjust as needed.    9/15 1911 HL subtherapeutic at 0.20. Will order 900unit bolus and increase heparin infusion to 900units/hr. Recheck heparin level in 6 hours.   9/16 @ 0300 HL < 0.10 subtherapeutic. Will rebolus w/ heparin 2000 units IV x 1 and will increase rate to 1050 units/hr and will recheck HL @ 0900. CBC appears stable.  9/16 @ HL <0.10 subtherpeutic. Will rebolus with 2000 units of heparin and will increase heparin gtt to 1250  units/hr and will recheck Heparin level @ 1700.   Demetrius Charity, PharmD, BCPS Clinical Pharmacist 07/25/2017 10:17 AM

## 2017-07-25 NOTE — Progress Notes (Signed)
Notified Dr. Emmit Pomfret of K level of 3.1 via text.

## 2017-07-25 NOTE — Progress Notes (Signed)
[facility] - [place] at [facility]   PATIENT NAME: Cameron Coffey    MR#:  [id number]  DATE OF BIRTH:  1961-10-23  SUBJECTIVE:  patient is admitted for sepsis due to UTI. Started on IV Rocephin. He says he is better.. But still had low-grade temperature. Denies any other complaints.   CHIEF COMPLAINT:   Chief Complaint  Patient presents with  . Fever  . Hyperglycemia    REVIEW OF SYSTEMS:    Review of Systems  Constitutional: Negative for chills and fever.  HENT: Negative for hearing loss.   Eyes: Negative for blurred vision, double vision and photophobia.  Respiratory: Negative for cough, hemoptysis and shortness of breath.   Cardiovascular: Negative for palpitations, orthopnea and leg swelling.  Gastrointestinal: Negative for abdominal pain, diarrhea and vomiting.  Genitourinary: Negative for dysuria and urgency.  Musculoskeletal: Negative for myalgias and neck pain.  Skin: Negative for rash.  Neurological: Positive for weakness. Negative for dizziness, focal weakness, seizures and headaches.  Psychiatric/Behavioral: Negative for memory loss. The patient does not have insomnia.     Nutrition: Tolerating Diet: Tolerating PT:      DRUG ALLERGIES:  No Known Allergies  VITALS:  Blood pressure 111/69, pulse 98, temperature 98.5 F (36.9 C), temperature source Oral, resp. rate 16, height  (1.727 m), weight 64.4 kg (142 lb), SpO2 97 %.  PHYSICAL EXAMINATION:   Physical Exam  GENERAL:  56 y.o.-year-old patient lying in the bed with no acute distress. Appears ill. EYES: Pupils equal, round, reactive to light   No scleral icterus. Extraocular muscles intact.  HEENT: Head atraumatic, normocephalic. Oropharynx and nasopharynx clear.  NECK:  Supple, no jugular venous distention. No thyroid enlargement, no tenderness.  LUNGS: Normal breath sounds bilaterally, no wheezing, rales,rhonchi or crepitation. No use of accessory muscles of respiration.   CARDIOVASCULAR: S1, S2 normal. No murmurs, rubs, or gallops.  ABDOMEN: Soft, nontender, nondistended. Bowel sounds present. No organomegaly or mass.  EXTREMITIES: No pedal edema, cyanosis, or clubbing.  NEUROLOGIC: Cranial nerves II through XII are intact. Muscle strength 5/5 in all extremities. Sensation intact. Gait not checked.  PSYCHIATRIC: The patient is alert and oriented x 3.  SKIN: No obvious rash, lesion, or ulcer.    LABORATORY PANEL:   CBC  Recent Labs Lab 07/25/17 0308  WBC 15.4*  HGB 14.5  HCT 42.4  PLT 153   ------------------------------------------------------------------------------------------------------------------  Chemistries   Recent Labs Lab 07/25/17 0308  NA 129*  K 3.1*  CL 94*  CO2 27  GLUCOSE 93  BUN 12  CREATININE 0.61  CALCIUM 7.9*   ------------------------------------------------------------------------------------------------------------------  Cardiac Enzymes  Recent Labs Lab 07/24/17 1223  TROPONINI 0.04*   ------------------------------------------------------------------------------------------------------------------  RADIOLOGY:  Dg Chest Port 1 View  Result Date: 07/24/2017 CLINICAL DATA:  Sepsis, hypoxia, rule out pneumonia. EXAM: PORTABLE CHEST 1 VIEW COMPARISON:  Chest radiograph 07/15/2017 FINDINGS: The cardiomediastinal contours are normal. The lungs are clear. Pulmonary vasculature is normal. No consolidation, pleural effusion, or pneumothorax. No acute osseous abnormalities are seen. IMPRESSION: No acute abnormality.  No evidence of pneumonia. Electronically Signed   By: Rubye Oaks M.D.   On: 07/24/2017 05:03     ASSESSMENT AND PLAN:   Active Problems:   Sepsis (HCC)   1 .sepsis secondary to UTI: Urine cultures are pending.   bloodCultures showed staph aureus; start cefazolin.  #2. hyponatremia due to dehydration: Improving with IV fluids .adjust IV fluids today.  3.Hyperkalemia;Status post  Kayexalate. Hyperkalemia resolved. #4. diabetes mellitus  type 2 poorly controlled, hemoglobin A1c is 14. On high-dose insulin with coverage, on Lantus. Consult diabetes coordinator, add mealtime insulin. #5. Elevated troponin secondary to demand ischemia from sepsis. Seen by cardiology. Echocardiogram is pending. Troponin levels are coming down. Heparin drip is not discontinued by cardiology. Follow-up echocardiogram." Shows no wall motion abnormality discontinue heparin. #6 possible BPH: Check bladder scan after voiding. To see for postvoid residual, check pelvic ultrasound evaluation of BPH. Discussed with patient,s  Girl firend and RN.    All records are reviewed and case discussed with Care Management/Social Workerr. Management plans discussed with the patient, family and they are in agreement.  CODE STATUS: full  TOTAL TIME TAKING CARE OF THIS PATIENT: 35 minutes.   POSSIBLE D/C IN 1-2DAYS, DEPENDING ON CLINICAL CONDITION.   Katha Hamming M.D on 07/25/2017 at 8:56 AM  Between 7am to 6pm - Pager - [phone]  After 6pm go to [email or web address] - password [email or web address] [facility]  [facility]  Office  [phone]  CC: Primary care physician; Lorre Munroe, NP

## 2017-07-25 NOTE — Progress Notes (Signed)
ANTICOAGULATION CONSULT NOTE - Initial Consult  Pharmacy Consult for heparin  Indication: chest pain/ACS  No Known Allergies  Patient Measurements: Height:  (172.7 cm) Weight: 142 lb (64.4 kg) IBW/kg (Calculated) : 68.4 Heparin Dosing Weight: 64.4kg  Vital Signs: Temp: 99.2 F (37.3 C) (09/15 2128) Temp Source: Oral (09/15 2128) BP: 127/71 (09/15 1933) Pulse Rate: 104 (09/15 1933)  Labs:  Recent Labs  07/24/17 0423 07/24/17 1012 07/24/17 1223 07/24/17 1911 07/25/17 0308  HGB 15.4  --   --   --  14.5  HCT 44.8  --   --   --  42.4  PLT 195  --   --   --  153  APTT  --   --  31  --   --   LABPROT  --   --  17.1*  --   --   INR  --   --  1.41  --   --   HEPARINUNFRC  --   --   --  0.20* <0.10*  CREATININE 0.95 0.89  --   --  0.61  TROPONINI 0.24* 0.70* 0.04*  --   --     Estimated Creatinine Clearance: 95 mL/min (by C-G formula based on SCr of 0.61 mg/dL).   Medical History: Past Medical History:  Diagnosis Date  . Diabetes mellitus without complication (HCC)     Medications:    Assessment:  56yo male presents with chest pain and elevated troponin. Pharmacy was consulted for heparin drip.   Goal of Therapy:  Heparin level 0.3-0.7 units/ml Monitor platelets by anticoagulation protocol: Yes   Plan:  Give 3900 units bolus x 1  Will initiate heparin drip at 750 units/hr. Will check HL 6 hours after heparin drip started. Pharmacy will continue to monitor and adjust as needed.    9/15 1911 HL subtherapeutic at 0.20. Will order 900unit bolus and increase heparin infusion to 900units/hr. Recheck heparin level in 6 hours.   9/16 @ 0300 HL < 0.10 subtherapeutic. Will rebolus w/ heparin 2000 units IV x 1 and will increase rate to 1050 units/hr and will recheck HL @ 0900. CBC appears stable.  Thomasene Ripple, PharmD, BCPS Clinical Pharmacist 07/25/2017 4:43 AM

## 2017-07-26 ENCOUNTER — Inpatient Hospital Stay: Payer: Self-pay | Admitting: Anesthesiology

## 2017-07-26 ENCOUNTER — Encounter: Payer: Self-pay | Admitting: Anesthesiology

## 2017-07-26 ENCOUNTER — Inpatient Hospital Stay (HOSPITAL_COMMUNITY)
Admit: 2017-07-26 | Discharge: 2017-07-26 | Disposition: A | Discharge status: Home / Self Care | Payer: Self-pay | Attending: Cardiology | Admitting: Cardiology

## 2017-07-26 ENCOUNTER — Encounter
Admission: EM | Disposition: A | Discharge status: Skilled Nursing Facility | Payer: Self-pay | Source: Home / Self Care | Attending: Internal Medicine

## 2017-07-26 DIAGNOSIS — N412 Abscess of prostate: Secondary | ICD-10-CM

## 2017-07-26 DIAGNOSIS — I248 Other forms of acute ischemic heart disease: Secondary | ICD-10-CM

## 2017-07-26 DIAGNOSIS — I5030 Unspecified diastolic (congestive) heart failure: Secondary | ICD-10-CM

## 2017-07-26 HISTORY — PX: TRANSURETHRAL RESECTION OF PROSTATE: SHX73

## 2017-07-26 LAB — ECHOCARDIOGRAM COMPLETE
HEIGHTINCHES: 68 in
WEIGHTICAEL: 2272 [oz_av]

## 2017-07-26 LAB — GLUCOSE, CAPILLARY
GLUCOSE-CAPILLARY: 81 mg/dL (ref 65–99)
Glucose-Capillary: 198 mg/dL — ABNORMAL HIGH (ref 65–99)
Glucose-Capillary: 301 mg/dL — ABNORMAL HIGH (ref 65–99)
Glucose-Capillary: 155 mg/dL — ABNORMAL HIGH (ref 65–99)

## 2017-07-26 LAB — HEPARIN LEVEL (UNFRACTIONATED)
HEPARIN UNFRACTIONATED: 0.12 [IU]/mL — AB (ref 0.30–0.70)
Heparin Unfractionated: 0.11 IU/mL — ABNORMAL LOW (ref 0.30–0.70)
Heparin Unfractionated: <0.1 IU/mL — ABNORMAL LOW (ref 0.30–0.70)

## 2017-07-26 LAB — C-REACTIVE PROTEIN: CRP: 7.8 mg/dL — ABNORMAL HIGH (ref <1.0)

## 2017-07-26 LAB — APTT: APTT: 58 s — AB (ref 24–36)

## 2017-07-26 SURGERY — TURP (TRANSURETHRAL RESECTION OF PROSTATE)
Anesthesia: General | Site: Penis | Wound class: Clean Contaminated

## 2017-07-26 MED ORDER — SUCCINYLCHOLINE CHLORIDE 20 MG/ML IJ SOLN
INTRAMUSCULAR | Status: AC
  Filled 2017-07-26: qty 1

## 2017-07-26 MED ORDER — INSULIN STARTER KIT- SYRINGES (ENGLISH)
1.0000 | Freq: Once | Status: AC
  Administered 2017-07-26: 1
  Filled 2017-07-26: qty 1

## 2017-07-26 MED ORDER — MIDAZOLAM HCL 2 MG/2ML IJ SOLN
INTRAMUSCULAR | Status: DC | PRN
  Administered 2017-07-26: 2 mg via INTRAVENOUS

## 2017-07-26 MED ORDER — ONDANSETRON HCL 4 MG/2ML IJ SOLN
INTRAMUSCULAR | Status: DC | PRN
  Administered 2017-07-26: 4 mg via INTRAVENOUS

## 2017-07-26 MED ORDER — SUGAMMADEX SODIUM 200 MG/2ML IV SOLN
INTRAVENOUS | Status: DC | PRN
Start: 2017-07-26 — End: 2017-07-26
  Administered 2017-07-26: 130 mg via INTRAVENOUS

## 2017-07-26 MED ORDER — SUCCINYLCHOLINE CHLORIDE 20 MG/ML IJ SOLN
INTRAMUSCULAR | Status: DC | PRN
  Administered 2017-07-26: 60 mg via INTRAVENOUS

## 2017-07-26 MED ORDER — PROPOFOL 10 MG/ML IV BOLUS
INTRAVENOUS | Status: DC | PRN
  Administered 2017-07-26: 140 mg via INTRAVENOUS

## 2017-07-26 MED ORDER — SUGAMMADEX SODIUM 200 MG/2ML IV SOLN
INTRAVENOUS | Status: AC
  Filled 2017-07-26: qty 2

## 2017-07-26 MED ORDER — HEPARIN BOLUS VIA INFUSION
2000.0000 [IU] | Freq: Once | INTRAVENOUS | Status: AC
  Administered 2017-07-26: 2000 [IU] via INTRAVENOUS
  Filled 2017-07-26: qty 2000

## 2017-07-26 MED ORDER — LIDOCAINE HCL (CARDIAC) 20 MG/ML IV SOLN
INTRAVENOUS | Status: DC | PRN
  Administered 2017-07-26: 50 mg via INTRAVENOUS

## 2017-07-26 MED ORDER — MIDAZOLAM HCL 2 MG/2ML IJ SOLN
INTRAMUSCULAR | Status: AC
  Filled 2017-07-26: qty 2

## 2017-07-26 MED ORDER — LIDOCAINE HCL (PF) 2 % IJ SOLN
INTRAMUSCULAR | Status: AC
  Filled 2017-07-26: qty 2

## 2017-07-26 MED ORDER — PROPOFOL 500 MG/50ML IV EMUL
INTRAVENOUS | Status: AC
  Filled 2017-07-26: qty 50

## 2017-07-26 MED ORDER — FENTANYL CITRATE (PF) 100 MCG/2ML IJ SOLN
INTRAMUSCULAR | Status: AC
  Filled 2017-07-26: qty 2

## 2017-07-26 MED ORDER — SEVOFLURANE IN SOLN
RESPIRATORY_TRACT | Status: AC
  Filled 2017-07-26: qty 250

## 2017-07-26 MED ORDER — ONDANSETRON HCL 4 MG/2ML IJ SOLN
INTRAMUSCULAR | Status: AC
  Filled 2017-07-26: qty 2

## 2017-07-26 MED ORDER — ROCURONIUM BROMIDE 50 MG/5ML IV SOLN
INTRAVENOUS | Status: AC
  Filled 2017-07-26: qty 1

## 2017-07-26 MED ORDER — SODIUM CHLORIDE 0.9 % IV SOLN
INTRAVENOUS | Status: DC | PRN
  Administered 2017-07-26 (×2): via INTRAVENOUS

## 2017-07-26 MED ORDER — ROCURONIUM BROMIDE 100 MG/10ML IV SOLN
INTRAVENOUS | Status: DC | PRN
  Administered 2017-07-26: 20 mg via INTRAVENOUS

## 2017-07-26 MED ORDER — LIVING WELL WITH DIABETES BOOK
Freq: Once | Status: AC
  Administered 2017-07-26: 08:00:00
  Filled 2017-07-26: qty 1

## 2017-07-26 MED ORDER — FENTANYL CITRATE (PF) 100 MCG/2ML IJ SOLN
INTRAMUSCULAR | Status: DC | PRN
  Administered 2017-07-26: 50 ug via INTRAVENOUS

## 2017-07-26 SURGICAL SUPPLY — 25 items
ADAPTER IRRIG TUBE 2 SPIKE SOL (ADAPTER) ×6 IMPLANT
BAG DRAIN CYSTO-URO LG1000N (MISCELLANEOUS) ×3 IMPLANT
BAG URINE DRAINAGE (UROLOGICAL SUPPLIES) ×3 IMPLANT
BAG URO DRAIN 4000ML (MISCELLANEOUS) ×3 IMPLANT
CATH FOL 2WAY LX 22X30 (CATHETERS) ×3 IMPLANT
CATH FOL 2WAY LX 24X30 (CATHETERS) IMPLANT
CATH FOL LEG HOLDER (MISCELLANEOUS) IMPLANT
DRAPE UTILITY 15X26 TOWEL STRL (DRAPES) ×3 IMPLANT
ELECT LOOP 22F BIPOLAR SML (ELECTROSURGICAL)
ELECTRODE LOOP 22F BIPOLAR SML (ELECTROSURGICAL) IMPLANT
GLOVE BIO SURGEON STRL SZ 6.5 (GLOVE) ×4 IMPLANT
GLOVE BIO SURGEONS STRL SZ 6.5 (GLOVE) ×2
GOWN STRL REUS W/ TWL LRG LVL3 (GOWN DISPOSABLE) ×2 IMPLANT
GOWN STRL REUS W/TWL LRG LVL3 (GOWN DISPOSABLE) ×4
HOLDER FOLEY CATH W/STRAP (MISCELLANEOUS) ×3 IMPLANT
KIT RM TURNOVER CYSTO AR (KITS) ×3 IMPLANT
LOOP CUT BIPOLAR 24F LRG (ELECTROSURGICAL) ×3 IMPLANT
PACK CYSTO AR (MISCELLANEOUS) ×3 IMPLANT
SET IRRIG Y TYPE TUR BLADDER L (SET/KITS/TRAYS/PACK) ×3 IMPLANT
SET IRRIGATING DISP (SET/KITS/TRAYS/PACK) IMPLANT
SOL .9 NS 3000ML IRR  AL (IV SOLUTION) ×8
SOL .9 NS 3000ML IRR UROMATIC (IV SOLUTION) ×4 IMPLANT
SYR TOOMEY 50ML (SYRINGE) ×3 IMPLANT
SYRINGE IRR TOOMEY STRL 70CC (SYRINGE) ×3 IMPLANT
WATER STERILE IRR 1000ML POUR (IV SOLUTION) ×3 IMPLANT

## 2017-07-26 NOTE — Progress Notes (Signed)
Diabetes book and insulin kit gone over with patient and family. Explained that patient will need to do return demonstration for administering insulin with next scheduled coverage

## 2017-07-26 NOTE — Anesthesia Post-op Follow-up Note (Signed)
Anesthesia QCDR form completed.        

## 2017-07-26 NOTE — Progress Notes (Signed)
*  PRELIMINARY RESULTS* Echocardiogram 2D Echocardiogram has been performed.  Cristela Blue 07/26/2017, 8:56 AM

## 2017-07-26 NOTE — Progress Notes (Signed)
Progress Note  Patient Name: Cameron Coffey Date of Encounter: 07/26/2017  Primary Cardiologist: New  Subjective   No chest pain or shortness of breath.  Inpatient Medications    Scheduled Meds: . aspirin  81 mg Oral Daily  . glipiZIDE  10 mg Oral BID AC  . Influenza vac split quadrivalent PF  0.5 mL Intramuscular Tomorrow-1000  . insulin aspart  0-20 Units Subcutaneous TID WC  . insulin aspart  0-5 Units Subcutaneous QHS  . insulin glargine  20 Units Subcutaneous QHS  . insulin starter kit- syringes  1 kit Other Once  . lisinopril  5 mg Oral Daily  . metoprolol tartrate  12.5 mg Oral BID  . pneumococcal 23 valent vaccine  0.5 mL Intramuscular Tomorrow-1000  . simvastatin  40 mg Oral Daily   Continuous Infusions: . sodium chloride 75 mL/hr at 07/25/17 0905  . piperacillin-tazobactam (ZOSYN)  IV Stopped (07/26/17 0917)   PRN Meds: acetaminophen **OR** acetaminophen, HYDROcodone-acetaminophen, ondansetron **OR** ondansetron (ZOFRAN) IV, senna-docusate   Vital Signs    Vitals:   07/26/17 0501 07/26/17 0655 07/26/17 0911 07/26/17 0915  BP: 110/73  108/71 108/71  Pulse: (!) 110   88  Resp: 18   18  Temp: (!) 100.7 F (38.2 C) 99.3 F (37.4 C)    TempSrc: Oral Oral    SpO2: 100%   97%  Weight:      Height:        Intake/Output Summary (Last 24 hours) at 07/26/17 1133 Last data filed at 07/26/17 1128  Gross per 24 hour  Intake          2711.96 ml  Output              950 ml  Net          1761.96 ml   Filed Weights   07/24/17 0424  Weight: 142 lb (64.4 kg)    Telemetry    NSR - Personally Reviewed  ECG    NA - Personally Reviewed  Physical Exam   GEN: No acute distress.   Neck: No  JVD Cardiac: RRR, 1/6 apical systolic murmur short and at the apex only, no diastolic murmurs, rubs, or gallops.  Respiratory: Clear  to auscultation bilaterally. GI: Soft, nontender, non-distended  MS: No  edema; No deformity.  There is a non tender firm swelling  under the right shoulder blade.  Neuro:  Nonfocal  Psych: Normal affect   Labs    Chemistry  Recent Labs Lab 07/24/17 0423 07/24/17 1012 07/24/17 1223 07/25/17 0308  NA 123* 125*  --  129*  K 5.6* 3.6 3.9 3.1*  CL 86* 92*  --  94*  CO2 25 27  --  27  GLUCOSE 488* 422*  --  93  BUN 10 12  --  12  CREATININE 0.95 0.89  --  0.61  CALCIUM 8.2* 7.9*  --  7.9*  GFRNONAA >60 >60  --  >60  GFRAA >60 >60  --  >60  ANIONGAP 12 6  --  8     Hematology  Recent Labs Lab 07/24/17 0423 07/25/17 0308  WBC 17.9* 15.4*  RBC 4.83 4.56  HGB 15.4 14.5  HCT 44.8 42.4  MCV 92.9 92.9  MCH 31.9 31.7  MCHC 34.4 34.1  RDW 14.2 14.0  PLT 195 153    Cardiac Enzymes  Recent Labs Lab 07/24/17 0423 07/24/17 1012 07/24/17 1223  TROPONINI 0.24* 0.70* 0.04*   No results for input(s):  TROPIPOC in the last 168 hours.   BNPNo results for input(s): BNP, PROBNP in the last 168 hours.   DDimer No results for input(s): DDIMER in the last 168 hours.   Radiology    Ct Abdomen Pelvis W Wo Contrast  Result Date: 07/26/2017 CLINICAL DATA:  Hematuria, weight loss, abdominal pain. Urinary tract infection. EXAM: CT ABDOMEN AND PELVIS WITHOUT AND WITH CONTRAST TECHNIQUE: Multidetector CT imaging of the abdomen and pelvis was performed following the standard protocol before and following the bolus administration of intravenous contrast. CONTRAST:  111m ISOVUE-300 IOPAMIDOL (ISOVUE-300) INJECTION 61% COMPARISON:  None. FINDINGS: Lower chest: 8 mm nodule seen in the right middle lobe. Image quality is degraded by respiratory motion. Small left pleural effusion collapse/ consolidation in the left lower lobe. Heart size normal. No pericardial effusion. Distal esophagus is grossly unremarkable. Hepatobiliary: Liver margin is markedly irregular. Stones are seen in the gallbladder. No biliary ductal dilatation. Pancreas: Negative. Spleen: Negative. Adrenals/Urinary Tract: Adrenal glands are unremarkable. Stones  are seen in the kidneys bilaterally. There is a striated appearance in both kidneys with areas of ill-defined low attenuation on portal venous phase imaging. 1.3 cm low-attenuation lesion is seen in the interpolar left kidney (series 8, image 31), too small to characterize. On delayed imaging there is low attenuation material in the nondependent portion of the bladder. Stomach/Bowel: Stomach, small bowel, appendix and colon are grossly unremarkable. Vascular/Lymphatic: Atherosclerotic calcification of the arterial vasculature without abdominal aortic aneurysm. Borderline enlarged porta hepatis lymph nodes, likely reactive. Reproductive: Prostate is enlarged and contains a large area of low-attenuation, measuring approximately 3.9 x 4.1 cm. Other: Moderate ascites.  Mild presacral edema. Musculoskeletal: Diffuse body wall edema. There are multiple fluid collections within the chest wall musculature, some of which are best seen on nephrographic phase imaging and some of which have peripheral high attenuation on portal venous phase imaging. Index collection in the left lateral chest wall measures 3.3 cm (incompletely imaged, series 5, image 1). A 1.3 cm low-attenuation collection with peripheral high attenuation is seen in the high right thigh musculature (series 5, image 76). No worrisome lytic or sclerotic lesions. IMPRESSION: 1. Organized fluid collections within the prostate and soft tissue musculature, highly worrisome for abscesses. 2. Striated appearance of both kidneys, most indicative of pyelonephritis. Difficult to exclude developing abscesses. 3. Debris in the bladder.  Difficult to exclude a mass. 4. Right middle lobe nodule. Metastatic disease is not excluded. Continued attention on follow-up is recommended. 5. Cirrhosis with moderate ascites. 6. Small left pleural effusion with collapse/consolidation in the left lower lobe. 7. Bilateral renal stones. 8. Cholelithiasis. 9.  Aortic atherosclerosis  (ICD10-170.0). Electronically Signed   By: MLorin PicketM.D.   On: 07/26/2017 08:03   [place]elvis Limited (transabdominal Only)  Result Date: 07/25/2017 CLINICAL DATA:  Urinary retention and possible benign prostatic hypertrophy. EXAM: LIMITED ULTRASOUND OF PELVIS TECHNIQUE: Limited transabdominal ultrasound examination of the pelvis was performed. COMPARISON:  None. FINDINGS: Posterior urinary bladder wall mass greater than expected for the inter your icteric ridge shown for example on image 13, there is a 5.9 by 1.0 by 5.1 cm in thickness. Transitional cell carcinoma is a distinct possibility. The the prostate is moderately enlarged, volume calculated at 69 cubic cm. Calcifications in the prostate parenchyma are observed. Prevoid bladder volume was 146 cubic cm. Postvoid volume was 38 cubic cm. IMPRESSION: 1. Possible posterior bladder wall mass, for example image 13, transitional cell carcinoma is not excluded and cystoscopy is recommended. 2. Moderate  enlargement the prostate gland at 69 cubic cm. Electronically Signed   By: Van Clines M.D.   On: 07/25/2017 12:19    Cardiac Studies   ECHO:  07/26/17 - Left ventricle: The cavity size was normal. There was moderate   concentric hypertrophy. Systolic function was normal. The   estimated ejection fraction was in the range of 60% to 65%. Wall   motion was normal; there were no regional wall motion   abnormalities. Doppler parameters are consistent with abnormal   left ventricular relaxation (grade 1 diastolic dysfunction). - Pericardium, extracardiac: A trivial pericardial effusion was   identified posterior to the heart.  Patient Profile     56 y.o. male with sepsis and uncontrolled DM.  We were consulted with an elevated troponin.  No past cardiac history.  Assessment & Plan    1. ELEVATED TROPONIN:   Likely due to demand ischemia. No need for anticoagulation. Echocardiogram showed normal LV systolic function and wall motion.   Recommend an outpatient stress test.  2. STAPH BACTEREMIA:  MSSA.  Low suspicion for cardiac involvement.   No evidence of vegetations by echocardiogram. The likely source is pelvic abscess as seen on CT scan.  We will sign off. Please have the patient follow-up with [place] in one to 2 weeks after discharge. Reconsult if needed.   Signed, Kathlyn Sacramento, MD  07/26/2017, 11:33 AM

## 2017-07-26 NOTE — Progress Notes (Addendum)
Inpatient Diabetes Program Recommendations  AACE/ADA: New Consensus Statement on Inpatient Glycemic Control (2015)  Target Ranges:  Prepandial:   less than 140 mg/dL      Peak postprandial:   less than 180 mg/dL (1-2 hours)      Critically ill patients:  140 - 180 mg/dL   Results for Cameron Coffey, Cameron Coffey (MRN [id number]) as of 07/26/2017 08:18  Ref. Range 07/25/2017 07:45 07/25/2017 12:14 07/25/2017 16:46 07/25/2017 22:20 07/26/2017 07:25  Glucose-Capillary Latest Ref Range: 65 - 99 mg/dL 151 (H) 372 (H) 298 (H) 112 (H) 198 (H)   Review of Glycemic Control  Diabetes history: DM2 Outpatient Diabetes medications: Glipizide 10 mg BID, Metformin 1000 mg BID Current orders for Inpatient glycemic control: Lantus 20 units QHS, Novlog 0-20 units TID with meals, Novolog 0-5 units QHS, Glipizide 10 mg BID  Inpatient Diabetes Program Recommendations:  Insulin - Basal: In reviewing chart, noted Lantus was was NOT GIVEN last night and fasting glucose 198 mg/dl today. Due to cost of insulin and lack of insurance, recommend discontinuing Lantus and ordering 70/30 10 units BID (while will provide 14 units for basal and 6 units for meal coverage per day). HgbA1C: A1C 14.5% on 07/24/17 indicating an average glucose of 369 mg/dl over the past 2-3 months.  NOTE: Consult noted and chart reviewed. Patient does not currently have insurance and will need an affordable DM medication regimen. In reviewing chart, noted patient has used insulin in the past. Ordered: Living Well with Diabetes book, insulin starter kit (syringe), and patient education by bedside RNs. Will plan to talk with patient today.   Addendum 07/26/17@14 :40-Spoke with patient over phone (Diabetes Coordinator working from [employer] today) about diabetes and home regimen for diabetes control. Patient reports that he does not currently see a doctor and that he has no insurance at this time. Patient states that he has had DM for 7 years. Inquired about  Glipizide and Metformin and patient states that he has taken both in the past but has not been on any DM medication for awhile. Inquired about insulin in the past and patient reports that he has been on Lantus in the past and he has used vial/syringe and insulin pens in the past. Patient states that he does not check his glucose at home and he states that he does not have a glucometer or testing supplies at home.  Discussed A1C results (14.5% on 07/24/17) and explained that his current A1C indicates an average glucose of 369 mg/dl over the past 2-3 months. Discussed glucose and A1C goals. Discussed importance of checking CBGs and maintaining good CBG control to prevent long-term and short-term complications. Stressed to the patient the importance of improving glycemic control to prevent further complications from uncontrolled diabetes. Discussed impact of nutrition, exercise, stress, sickness, and medications on diabetes control. Informed patient that he should be receiving an insulin starter kit and a book called Living Well With Diabetes. Encouraged patient to read over both the insulin starter kit and the book and to ask RN if he has any questions. Discussed Lantus and Novolog insulin as currently ordered. Explained that due to cost of Lantus and Novolog, it would be recommended that NOVOLIN 70/30 insulin be prescribed. Discussed NOVOLIN 70/30 insulin and how it is typically given BID (with breakfast and supper). Noted patient has been seen by CM and has been given application for medication management clinic [facility]). Patient's sister was in the room and he asked his sister if she had seen the  application. Patient asked that I talk with his sister; patient's sister reported that she has already filled out the application and faxed it over the the [facility]. Patient's sister asked to speak with the CM to discuss discharge plan as she reports that the patient will not be able to go back and live with their parents because  they can not provide care for him. Spoke back with patient and he gave permission to ask CM to talk with his sister. Informed patient that nursing will be asked to allow him to self administer insulin injections to ensure proper technique. Patient verbalized understanding of information discussed and he states that he has no further questions at this time related to diabetes. Called N. Nyoka Cowden, RN, CM and asked that she talk with patient's sister who is currently in the patient's room. Also called Oris Drone, RN and asked that patient be allowed to self-inject insulin.  At time of discharge, patient will need Rx for: NOVOLIN 70/30 insulin vial(s), insulin syringes, glucometer and testing supplies, and any other DM medications prescribed as an outpatient.  Thanks, Barnie Alderman, RN, MSN, CDE Diabetes Coordinator Inpatient Diabetes Program [phone] (Team Pager from 8am to 5pm)

## 2017-07-26 NOTE — Transfer of Care (Signed)
Immediate Anesthesia Transfer of Care Note  Patient: Cameron Coffey  Procedure(s) Performed: Procedure(s): TRANSURETHRAL RESECTION OF THE PROSTATE (TURP) WITH UPROOFING OF PROSTATE ABSCESS (N/A)  Patient Location: PACU  Anesthesia Type:General  Level of Consciousness: sedated and patient cooperative  Airway & Oxygen Therapy: Patient Spontanous Breathing and Patient connected to nasal cannula oxygen  Post-op Assessment: Report given to RN and Post -op Vital signs reviewed and stable  Post vital signs: Reviewed and stable  Last Vitals:  Vitals:   07/26/17 2104 07/26/17 2356  BP: 119/66 (!) 144/97  Pulse: (!) 104 97  Resp: 18 20  Temp: 37.3 C 37.4 C  SpO2: 100% 100%    Last Pain:  Vitals:   07/26/17 2104  TempSrc: Oral  PainSc: 8       Patients Stated Pain Goal: 3 (07/26/17 2104)  Complications: No apparent anesthesia complications

## 2017-07-26 NOTE — Progress Notes (Signed)
    Await echo.  

## 2017-07-26 NOTE — Progress Notes (Signed)
ANTICOAGULATION CONSULT NOTE - Initial Consult  Pharmacy Consult for heparin  Indication: chest pain/ACS  No Known Allergies  Patient Measurements: Height:  (172.7 cm) Weight: 142 lb (64.4 kg) IBW/kg (Calculated) : 68.4 Heparin Dosing Weight: 64.4kg  Vital Signs: Temp: 99.3 F (37.4 C) (09/17 0655) Temp Source: Oral (09/17 0655) BP: 110/73 (09/17 0501) Pulse Rate: 110 (09/17 0501)  Labs:  Recent Labs  07/24/17 0423 07/24/17 1012 07/24/17 1223  07/25/17 0308  07/25/17 1718 07/26/17 0153 07/26/17 0752  HGB 15.4  --   --   --  14.5  --   --   --   --   HCT 44.8  --   --   --  42.4  --   --   --   --   PLT 195  --   --   --  153  --   --   --   --   APTT  --   --  31  --   --   --   --  58*  --   LABPROT  --   --  17.1*  --   --   --   --   --   --   INR  --   --  1.41  --   --   --   --   --   --   HEPARINUNFRC  --   --   --   < > <0.10*  < > <0.10* 0.12* 0.11*  CREATININE 0.95 0.89  --   --  0.61  --   --   --   --   TROPONINI 0.24* 0.70* 0.04*  --   --   --   --   --   --   < > = values in this interval not displayed.  Estimated Creatinine Clearance: 95 mL/min (by C-G formula based on SCr of 0.61 mg/dL).   Medical History: Past Medical History:  Diagnosis Date  . Diabetes mellitus without complication (HCC)     Medications:    Assessment:  56yo male presents with chest pain and elevated troponin. Pharmacy was consulted for heparin drip.   Goal of Therapy:  Heparin level 0.3-0.7 units/ml Monitor platelets by anticoagulation protocol: Yes   Plan:  Give 3900 units bolus x 1  Will initiate heparin drip at 750 units/hr. Will check HL 6 hours after heparin drip started. Pharmacy will continue to monitor and adjust as needed.    9/15 1911 HL subtherapeutic at 0.20. Will order 900unit bolus and increase heparin infusion to 900units/hr. Recheck heparin level in 6 hours.   9/16 @ 0300 HL < 0.10 subtherapeutic. Will rebolus w/ heparin 2000 units IV x 1  and will increase rate to 1050 units/hr and will recheck HL @ 0900. CBC appears stable.  9/16 @ HL <0.10 subtherpeutic. Will rebolus with 2000 units of heparin and will increase heparin gtt to 1250 units/hr and will recheck Heparin level @ 1700.   9/16 @ 1718 HL <0.10 suptherapeutic. Will bolus with 2000 units heparin and increase heparin infusion to 1500 units/hr. Will recheck HL and check aPTT in 6 hours.   9/17 @ 0153 HL 0.12 subtherapeutic. Will rebolus w/ heparin 2000 units IV x 1 and increase rate to 1600 units/hr and will recheck HL @ 0800.  9/17 0752 HL subtherapeutic. 2000 units IV x 1 bolus and increase rate to 1750 units/hr. Will recheck HL in 6 hours.  Carola Frost,  PharmD, BCPS Clinical Pharmacist 07/26/2017 8:54 AM

## 2017-07-26 NOTE — Progress Notes (Signed)
Pt getting anxious about not being able to eat, keeps asking about when he can eat and questions about the procedure. Educated about why he needs to be NPO and let MD know about his concerns. Still waiting on MD's response. Will continue to monitor.

## 2017-07-26 NOTE — Anesthesia Preprocedure Evaluation (Addendum)
Anesthesia Evaluation  Patient identified by MRN, date of birth, ID band Patient awake    Reviewed: Allergy & Precautions, NPO status , Patient's Chart, lab work & pertinent test results, reviewed documented beta blocker date and time   Airway Mallampati: II  TM Distance: >3 FB     Dental  (+) Chipped, Upper Dentures, Lower Dentures   Pulmonary former smoker,           Cardiovascular hypertension, Pt. on medications      Neuro/Psych    GI/Hepatic   Endo/Other  diabetes, Type 2  Renal/GU      Musculoskeletal   Abdominal   Peds  Hematology   Anesthesia Other Findings Echo shows EF 65. BP 97/60. Truck driver - retired.  Reproductive/Obstetrics                           Anesthesia Physical Anesthesia Plan  ASA: III  Anesthesia Plan: General   Post-op Pain Management:    Induction: Intravenous  PONV Risk Score and Plan:   Airway Management Planned: Oral ETT and LMA  Additional Equipment:   Intra-op Plan:   Post-operative Plan:   Informed Consent: I have reviewed the patients History and Physical, chart, labs and discussed the procedure including the risks, benefits and alternatives for the proposed anesthesia with the patient or authorized representative who has indicated his/her understanding and acceptance.     Plan Discussed with: CRNA  Anesthesia Plan Comments:         Anesthesia Quick Evaluation

## 2017-07-26 NOTE — Progress Notes (Signed)
[facility] [place] at [facility] [place]   PATIENT NAME: Cameron Coffey    MR#:  [id number]  DATE OF BIRTH:  April 17, 1961  SUBJECTIVE:  Patient has MSSA bacteremia. . Patient still has low-grade fever. Scheduled to have urological procedure today.  CHIEF COMPLAINT:   Chief Complaint  Patient presents with  . Fever  . Hyperglycemia    REVIEW OF SYSTEMS:    Review of Systems  Constitutional: Negative for chills and fever.  HENT: Negative for hearing loss.   Eyes: Negative for blurred vision, double vision and photophobia.  Respiratory: Negative for cough, hemoptysis and shortness of breath.   Cardiovascular: Negative for palpitations, orthopnea and leg swelling.  Gastrointestinal: Negative for abdominal pain, diarrhea and vomiting.  Genitourinary: Negative for dysuria and urgency.  Musculoskeletal: Positive for back pain and myalgias. Negative for neck pain.  Skin: Negative for rash.  Neurological: Positive for weakness. Negative for dizziness, focal weakness, seizures and headaches.  Psychiatric/Behavioral: Negative for memory loss. The patient does not have insomnia.     Nutrition: Tolerating Diet: Tolerating PT:      DRUG ALLERGIES:  No Known Allergies  VITALS:  Blood pressure 97/63, pulse 69, temperature 99.3 F (37.4 C), temperature source Oral, resp. rate 18, height  (1.727 m), weight 64.4 kg (142 lb), SpO2 100 %.  PHYSICAL EXAMINATION:   Physical Exam  GENERAL:  56 y.o.-year-old patient lying in the bed with no acute distress. Appears ill. EYES: Pupils equal, round, reactive to light   No scleral icterus. Extraocular muscles intact.  HEENT: Head atraumatic, normocephalic. Oropharynx and nasopharynx clear.  NECK:  Supple, no jugular venous distention. No thyroid enlargement, no tenderness.  LUNGS: Normal breath sounds bilaterally, no wheezing, rales,rhonchi or crepitation. No use of accessory muscles of respiration.  CARDIOVASCULAR: S1,  S2 normal. No murmurs, rubs, or gallops.  ABDOMEN: Soft, nontender, nondistended. Bowel sounds present. No organomegaly or mass.  EXTREMITIES: No pedal edema, cyanosis, or clubbing.  NEUROLOGIC: Cranial nerves II through XII are intact. Muscle strength 5/5 in all extremities. Sensation intact. Gait not checked. Patient complains of upper back pain, noted to have small left lipoma on the right scapular region. PSYCHIATRIC: The patient is alert and oriented x 3.  SKIN: No obvious rash, lesion, or ulcer.    LABORATORY PANEL:   CBC  Recent Labs Lab 07/25/17 0308  WBC 15.4*  HGB 14.5  HCT 42.4  PLT 153   ------------------------------------------------------------------------------------------------------------------  Chemistries   Recent Labs Lab 07/25/17 0308  NA 129*  K 3.1*  CL 94*  CO2 27  GLUCOSE 93  BUN 12  CREATININE 0.61  CALCIUM 7.9*   ------------------------------------------------------------------------------------------------------------------  Cardiac Enzymes  Recent Labs Lab 07/24/17 1223  TROPONINI 0.04*   ------------------------------------------------------------------------------------------------------------------  RADIOLOGY:  Ct Abdomen Pelvis W Wo Contrast  Result Date: 07/26/2017 CLINICAL DATA:  Hematuria, weight loss, abdominal pain. Urinary tract infection. EXAM: CT ABDOMEN AND PELVIS WITHOUT AND WITH CONTRAST TECHNIQUE: Multidetector CT imaging of the abdomen and pelvis was performed following the standard protocol before and following the bolus administration of intravenous contrast. CONTRAST:  ISOVUE-300 IOPAMIDOL (ISOVUE-300) INJECTION 61% COMPARISON:  None. FINDINGS: Lower chest: 8 mm nodule seen in the right middle lobe. Image quality is degraded by respiratory motion. Small left pleural effusion collapse/ consolidation in the left lower lobe. Heart size normal. No pericardial effusion. Distal esophagus is grossly unremarkable.  Hepatobiliary: Liver margin is markedly irregular. Stones are seen in the gallbladder. No biliary ductal dilatation. Pancreas: Negative.  Spleen: Negative. Adrenals/Urinary Tract: Adrenal glands are unremarkable. Stones are seen in the kidneys bilaterally. There is a striated appearance in both kidneys with areas of ill-defined low attenuation on portal venous phase imaging. 1.3 cm low-attenuation lesion is seen in the interpolar left kidney (series 8, image 31), too small to characterize. On delayed imaging there is low attenuation material in the nondependent portion of the bladder. Stomach/Bowel: Stomach, small bowel, appendix and colon are grossly unremarkable. Vascular/Lymphatic: Atherosclerotic calcification of the arterial vasculature without abdominal aortic aneurysm. Borderline enlarged porta hepatis lymph nodes, likely reactive. Reproductive: Prostate is enlarged and contains a large area of low-attenuation, measuring approximately 3.9 x 4.1 cm. Other: Moderate ascites.  Mild presacral edema. Musculoskeletal: Diffuse body wall edema. There are multiple fluid collections within the chest wall musculature, some of which are best seen on nephrographic phase imaging and some of which have peripheral high attenuation on portal venous phase imaging. Index collection in the left lateral chest wall measures 3.3 cm (incompletely imaged, series 5, image 1). A 1.3 cm low-attenuation collection with peripheral high attenuation is seen in the high right thigh musculature (series 5, image 76). No worrisome lytic or sclerotic lesions. IMPRESSION: 1. Organized fluid collections within the prostate and soft tissue musculature, highly worrisome for abscesses. 2. Striated appearance of both kidneys, most indicative of pyelonephritis. Difficult to exclude developing abscesses. 3. Debris in the bladder.  Difficult to exclude a mass. 4. Right middle lobe nodule. Metastatic disease is not excluded. Continued attention on  follow-up is recommended. 5. Cirrhosis with moderate ascites. 6. Small left pleural effusion with collapse/consolidation in the left lower lobe. 7. Bilateral renal stones. 8. Cholelithiasis. 9.  Aortic atherosclerosis (ICD10-170.0). Electronically Signed   By: Leanna Battles M.D.   On: 07/26/2017 08:03   US Pelvis Limited (transabdominal Only)  Result Date: 07/25/2017 CLINICAL DATA:  Urinary retention and possible benign prostatic hypertrophy. EXAM: LIMITED ULTRASOUND OF PELVIS TECHNIQUE: Limited transabdominal ultrasound examination of the pelvis was performed. COMPARISON:  None. FINDINGS: Posterior urinary bladder wall mass greater than expected for the inter your icteric ridge shown for example on image 13, there is a 5.9 by 1.0 by 5.1 cm in thickness. Transitional cell carcinoma is a distinct possibility. The the prostate is moderately enlarged, volume calculated at 69 cubic cm. Calcifications in the prostate parenchyma are observed. Prevoid bladder volume was 146 cubic cm. Postvoid volume was 38 cubic cm. IMPRESSION: 1. Possible posterior bladder wall mass, for example image 13, transitional cell carcinoma is not excluded and cystoscopy is recommended. 2. Moderate enlargement the prostate gland at 69 cubic cm. Electronically Signed   By: Gaylyn Rong M.D.   On: 07/25/2017 12:19     ASSESSMENT AND PLAN:   Active Problems:   Sepsis (HCC)   1 .staph aureus bacteremia, urine culture showed staph aureus. First blood culture showed MSSA.Repeat blood cultures are pending. So seen by Dr. Sampson Goon. If repeat blood cultures showing no infection patient needs IV Ancef for 2-4 weeks.discussed with the patient.    #2. hyponatremia due to dehydration: Improving with IV fluids .check Chem-7 today.  3.Hyperkalemia;Status post Kayexalate. Hyperkalemia resolved.  #4. diabetes mellitus type 2 poorly controlled, hemoglobin A1c is 14. Seen by diabetes coordinator. Patient cannot afford Lantus due to  cost. So we will give 70/30 10 units twice a day.  #5. Elevated troponin secondary to demand ischemia from sepsis. Seen by cardiology. Echocardiogram is pending. Troponin levels are coming down. H  echocardiogram." Shows no wall motion abnormality  discontinue heparin.  #6 /prostate abscess, pyelonephritis: Patient is going for cystoscopy by urology and possible drainage of abscess. #7 right middle lobe nodule: Possible metastasis. He will also get CT of the chest once he is back from urology procedure. Left lobe pneumonia. Zosyn should cover that. Overall prognosis . Weight loss, pyelonephritis, poorly controlled diabetes mellitus, bacteremia #8. Deconditioning; patient told me he is homeless:  physical therapy, OT evaluation.  All records are reviewed and case discussed with Care Management/Social Workerr. Management plans discussed with the patient, family and they are in agreement.  CODE STATUS: full  TOTAL TIME TAKING CARE OF THIS PATIENT: 35 minutes.   POSSIBLE D/C IN 1-2DAYS, DEPENDING ON CLINICAL CONDITION.   Katha Hamming M.D on 07/26/2017 at 12:15 PM  Between 7am to 6pm - Pager - [phone]  After 6pm go to [email or web address] - password [email or web address] [facility]  [place] [facility]  Office  [phone]  CC: Primary care physician; Lorre Munroe, NP

## 2017-07-26 NOTE — Progress Notes (Signed)
ANTICOAGULATION CONSULT NOTE - Initial Consult  Pharmacy Consult for heparin  Indication: chest pain/ACS  No Known Allergies  Patient Measurements: Height:  (172.7 cm) Weight: 142 lb (64.4 kg) IBW/kg (Calculated) : 68.4 Heparin Dosing Weight: 64.4kg  Vital Signs: Temp: 98.9 F (37.2 C) (09/16 2217) Temp Source: Oral (09/16 2217) BP: 103/68 (09/16 2217) Pulse Rate: 90 (09/16 2217)  Labs:  Recent Labs  07/24/17 0423 07/24/17 1012 07/24/17 1223  07/25/17 0308 07/25/17 0854 07/25/17 1718 07/26/17 0153  HGB 15.4  --   --   --  14.5  --   --   --   HCT 44.8  --   --   --  42.4  --   --   --   PLT 195  --   --   --  153  --   --   --   APTT  --   --  31  --   --   --   --  58*  LABPROT  --   --  17.1*  --   --   --   --   --   INR  --   --  1.41  --   --   --   --   --   HEPARINUNFRC  --   --   --   < > <0.10* <0.10* <0.10* 0.12*  CREATININE 0.95 0.89  --   --  0.61  --   --   --   TROPONINI 0.24* 0.70* 0.04*  --   --   --   --   --   < > = values in this interval not displayed.  Estimated Creatinine Clearance: 95 mL/min (by C-G formula based on SCr of 0.61 mg/dL).   Medical History: Past Medical History:  Diagnosis Date  . Diabetes mellitus without complication (HCC)     Medications:    Assessment:  56yo male presents with chest pain and elevated troponin. Pharmacy was consulted for heparin drip.   Goal of Therapy:  Heparin level 0.3-0.7 units/ml Monitor platelets by anticoagulation protocol: Yes   Plan:  Give 3900 units bolus x 1  Will initiate heparin drip at 750 units/hr. Will check HL 6 hours after heparin drip started. Pharmacy will continue to monitor and adjust as needed.    9/15 1911 HL subtherapeutic at 0.20. Will order 900unit bolus and increase heparin infusion to 900units/hr. Recheck heparin level in 6 hours.   9/16 @ 0300 HL < 0.10 subtherapeutic. Will rebolus w/ heparin 2000 units IV x 1 and will increase rate to 1050 units/hr and will  recheck HL @ 0900. CBC appears stable.  9/16 @ HL <0.10 subtherpeutic. Will rebolus with 2000 units of heparin and will increase heparin gtt to 1250 units/hr and will recheck Heparin level @ 1700.   9/16 @ 1718 HL <0.10 suptherapeutic. Will bolus with 2000 units heparin and increase heparin infusion to 1500 units/hr. Will recheck HL and check aPTT in 6 hours.   9/17 @ 0153 HL 0.12 subtherapeutic. Will rebolus w/ heparin 2000 units IV x 1 and increase rate to 1600 units/hr and will recheck HL @ 0800.  Thomasene Ripple, PharmD, BCPS Clinical Pharmacist 07/26/2017 2:35 AM

## 2017-07-26 NOTE — Evaluation (Signed)
Physical Therapy Evaluation Patient Details Name: Cameron Coffey MRN: [id number] DOB: [birth year] Today's Date: 07/26/2017   History of Present Illness  Pt is a 56 y/o M who presented with fever and chills and a burning sensation when urinating.  Pt was found to have a UTI and an elevated lactic acid level.  Blood sugar was also elevated and pt was dehydrated.  Code sepsis was called.  Pt with prostate abscess, pyelonephritis and pt is going for cystoscopy by urology and possible draining of abscess. noted that pt has R middle lobe nodule with possible metastasis, pending CT of the chest following urology procedure.      Clinical Impression  Pt admitted with above diagnosis. Pt currently with functional limitations due to the deficits listed below (see PT Problem List). Cameron Coffey appears to be self-limiting reporting he has pain "all over" but is unable to specify where or why.  He requires increased time for all aspects of mobility and does not follow cues for safe technique using RW.  He is from home where he live with his two elderly parents who are unable to provide the level of assist he requires.  He currently requires min guard assist for transfers and short distance ambulation.  He fatigues after ambulating 25 ft with RW.  Given pt's current mobility status, recommending SNF at d/c.  Pt will benefit from skilled PT to increase their independence and safety with mobility to allow discharge to the venue listed below.      Follow Up Recommendations SNF    Equipment Recommendations  Rolling walker with 5" wheels    Recommendations for Other Services       Precautions / Restrictions Precautions Precautions: Fall Restrictions Weight Bearing Restrictions: No      Mobility  Bed Mobility Overal bed mobility: Needs Assistance Bed Mobility: Supine to Sit;Sit to Supine     Supine to sit: HOB elevated;Min guard Sit to supine: Min guard   General bed mobility comments: Max increased  time as pt moves with a guarded posture which he attributes to his stiffness "all over".  No physical assist or cues needed.  Transfers Overall transfer level: Needs assistance Equipment used: Rolling walker (2 wheeled) Transfers: Sit to/from Stand Sit to Stand: Min guard         General transfer comment: Educated pt on proper and safe technique using RW on several occasions which the pt acknowledges but not follow and places Bil hands on RW with sit<>stand.    Ambulation/Gait Ambulation/Gait assistance: Min guard Ambulation Distance (Feet): 25 Feet Assistive device: Rolling walker (2 wheeled) Gait Pattern/deviations: Trunk flexed Gait velocity: decreased Gait velocity interpretation: Below normal speed for age/gender General Gait Details: Guarded posture and decreased gait speed.  Pt reports fatigue after ambulating 25 ft and declines ambulating farther.    Stairs            Wheelchair Mobility    Modified Rankin (Stroke Patients Only)       Balance Overall balance assessment: Needs assistance Sitting-balance support: No upper extremity supported;Feet supported Sitting balance-Leahy Scale: Good     Standing balance support: No upper extremity supported;During functional activity Standing balance-Leahy Scale: Fair Standing balance comment: Pt able to stand statically without UE support but relies on RW for dynamic activities and would likely lose his balance with perturbation  Pertinent Vitals/Pain Pain Assessment: 0-10 Pain Score: 10-Worst pain ever (Although pt does not appear to be in pain) Pain Location: generalized stiffness from lying in bed Pain Descriptors / Indicators:  ("stiffness") Pain Intervention(s): Limited activity within patient's tolerance;Monitored during session    Home Living Family/patient expects to be discharged to:: Private residence Living Arrangements: Parent (elderly parents) Available Help at  Discharge: Other (Comment) (no family/friends that can provide assist)           Home Equipment: Cane - single point (wooden cane) Additional Comments: Did not question home layout as pt unsure where he is going at d/c.  Will not be returning home with parents.    Prior Function Level of Independence: Needs assistance   Gait / Transfers Assistance Needed: Pt was ambulating with wooden cane short household distances, limited due to fatigue.  He denies any falls in the past 6 months.    ADL's / Homemaking Assistance Needed: Pt was not changing clothes as he was unable to and did not have assist.  Pt was independently showering.  Mother was doing the cooking. Pt no longer driving, family or friends assist with this.          Hand Dominance   Dominant Hand: Right    Extremity/Trunk Assessment   Upper Extremity Assessment Upper Extremity Assessment:  (BUE strength grossly 3/5)    Lower Extremity Assessment Lower Extremity Assessment:  (BLE strength grossly 4-/5)       Communication   Communication:  (pt mumbles making it challenging to hear him at times)  Cognition Arousal/Alertness: Awake/alert Behavior During Therapy: Flat affect Overall Cognitive Status: Impaired/Different from baseline Area of Impairment: Orientation                 Orientation Level: Time;Disoriented to (RN made aware)             General Comments: Pt initially says the year is 18 and when asked what 18 he responds with 1718.  Otherwise pt is oriented.  RN notified.   This may be a result of fatigue and/or not being able to eat today due to potential surgery.       General Comments General comments (skin integrity, edema, etc.): Sister in room during Evaluation    Exercises General Exercises - Lower Extremity Ankle Circles/Pumps: AROM;Both;10 reps;Supine Heel Slides: AROM;Both;5 reps;Supine   Assessment/Plan    PT Assessment Patient needs continued PT services  PT Problem List  Decreased strength;Decreased activity tolerance;Decreased balance;Decreased mobility;Decreased cognition;Decreased knowledge of use of DME;Decreased safety awareness;Pain       PT Treatment Interventions DME instruction;Gait training;Stair training;Functional mobility training;Therapeutic activities;Therapeutic exercise;Balance training;Neuromuscular re-education;Cognitive remediation;Patient/family education;Wheelchair mobility training;Modalities    PT Goals (Current goals can be found in the Care Plan section)  Acute Rehab PT Goals Patient Stated Goal: to get stronger PT Goal Formulation: With patient Time For Goal Achievement: 08/09/17 Potential to Achieve Goals: Fair    Frequency Min 2X/week   Barriers to discharge Decreased caregiver support No assist available at d/c    Co-evaluation               AM-PAC PT "6 Clicks" Daily Activity  Outcome Measure Difficulty turning over in bed (including adjusting bedclothes, sheets and blankets)?: Unable Difficulty moving from lying on back to sitting on the side of the bed? : Unable Difficulty sitting down on and standing up from a chair with arms (e.g., wheelchair, bedside commode, etc,.)?: A Lot Help needed moving to and from a  bed to chair (including a wheelchair)?: A Little Help needed walking in hospital room?: A Little Help needed climbing 3-5 steps with a railing? : A Lot 6 Click Score: 12    End of Session   Activity Tolerance: Patient limited by fatigue Patient left: in bed;with call bell/phone within reach;with bed alarm set;with family/visitor present Nurse Communication: Mobility status PT Visit Diagnosis: Muscle weakness (generalized) (M62.81);Unsteadiness on feet (R26.81);Difficulty in walking, not elsewhere classified (R26.2)    Time: 9604-5409 PT Time Calculation (min) (ACUTE ONLY): 30 min   Charges:   PT Evaluation $PT Eval Low Complexity: 1 Low PT Treatments $Therapeutic Activity: 8-22 mins   PT G  Codes:   PT G-Codes **NOT FOR INPATIENT CLASS** Functional Assessment Tool Used: AM-PAC 6 Clicks Basic Mobility;Clinical judgement Functional Limitation: Mobility: Walking and moving around Mobility: Walking and Moving Around Current Status (W1191): At least 60 percent but less than 80 percent impaired, limited or restricted Mobility: Walking and Moving Around Goal Status [phone]): At least 20 percent but less than 40 percent impaired, limited or restricted    Encarnacion Chu PT, DPT 07/26/2017, 2:30 PM

## 2017-07-26 NOTE — Anesthesia Procedure Notes (Signed)
Procedure Name: Intubation Date/Time: 07/26/2017 11:00 PM Performed by: Waldo Laine Pre-anesthesia Checklist: Patient identified, Patient being monitored, Timeout performed, Emergency Drugs available and Suction available Patient Re-evaluated:Patient Re-evaluated prior to induction Oxygen Delivery Method: Circle system utilized Preoxygenation: Pre-oxygenation with 100% oxygen Induction Type: IV induction Ventilation: Mask ventilation without difficulty Laryngoscope Size: [place] and 2 Grade View: Grade I Tube type: Oral Tube size: 7.5 mm Number of attempts: 1 Airway Equipment and Method: Stylet Placement Confirmation: ETT inserted through vocal cords under direct vision,  positive ETCO2 and breath sounds checked- equal and bilateral Secured at: 19 cm Tube secured with: Tape Dental Injury: Teeth and Oropharynx as per pre-operative assessment

## 2017-07-26 NOTE — Progress Notes (Signed)
A&O but forgetful. Up with one assist. Heparin drip and NS infusing. IV antibiotics given. Tylenol given for fever. Will continue to monitor.

## 2017-07-26 NOTE — Care Management (Addendum)
Patient presented to the ED with fever and weakness. Up to this point, patient was living with his 71 and 55 year old parents.  Patient says he can not go back to that living situation because "they can not care for me."  Patient has a girlfriend.  Patient says when he discharges from [facility], he needs to "go somewhere where I can get some help.  I can't even stand up by myself."  Requested order for physical therapy and occupational therapy consults.  There is discussion that patient is going to require long term IV antibiotics for positive blood cultures.  At present, patient would not be a candidate for home IV antibiotics as he says "I do not have a home to go to.  My girlfriend can not  do it either."  His girlfriend Cameron Coffey is going to have surgery tomorrow on cervical spine and will be in the hospital for at least five days.  Patient does not have have insurance - "I lost my [employer]."  Says he does not follow with PCP list on face sheet - Cameron Coffey.  There is discussion "of surgery" today but no one seems to know what surgery is being planned for today.  Updated CSW and primary nurse of concerns of plan for home IVT with PICC. Spoke with patient's sister Cameron Coffey and she confirms that patient can not return to his parents' home  and that there are no relatives that can assist in patient's care.

## 2017-07-26 NOTE — Brief Op Note (Signed)
07/24/2017 - 07/26/2017  11:38 PM  PATIENT:  Cameron Coffey  56 y.o. male  PRE-OPERATIVE DIAGNOSIS:  prostate abscess  POST-OPERATIVE DIAGNOSIS:  prostate abscess  PROCEDURE:  Procedure(s): TRANSURETHRAL RESECTION OF THE PROSTATE (TURP) WITH UPROOFING OF PROSTATE ABSCESS (N/A)  SURGEON:  Surgeon(s) and Role:    * Bjorn Pippin, MD - Primary  PHYSICIAN ASSISTANT:   ASSISTANTS: none   ANESTHESIA:   general  EBL:  Total I/O In: -  Out: 400 [Urine:400]  BLOOD ADMINISTERED:none  DRAINS: Urinary Catheter (Foley)   LOCAL MEDICATIONS USED:  NONE  SPECIMEN:  Source of Specimen:  prostate chips  DISPOSITION OF SPECIMEN:  PATHOLOGY  COUNTS:  YES  TOURNIQUET:  * No tourniquets in log *  DICTATION: .Other Dictation: Dictation Number [id number]  PLAN OF CARE: Admit to inpatient   PATIENT DISPOSITION:  PACU - hemodynamically stable.   Delay start of Pharmacological VTE agent (>24hrs) due to surgical blood loss or risk of bleeding: yes

## 2017-07-26 NOTE — Progress Notes (Signed)
Initial Nutrition Assessment  DOCUMENTATION CODES:   Not applicable  INTERVENTION:  1. Provide Glucerna Shake po TID, each supplement provides 220 kcal and 10 grams of protein w/ diet advancement 2. Monitor for diet education needs  NUTRITION DIAGNOSIS:   Inadequate oral intake related to social / environmental circumstances, chronic illness (unable to afford diabetic medications) as evidenced by per patient/family report.  GOAL:   Patient will meet greater than or equal to 90% of their needs  MONITOR:   PO intake, Labs, I & O's, Weight trends  REASON FOR ASSESSMENT:   Malnutrition Screening Tool    ASSESSMENT:   Cameron Coffey is a 56 yo male with PMHHx of DM2, HLD, presents with hyperglycemia, blood sugars in 500s, UTI, dehydration, sepsis. Patient apparently stopped taking his diabetic medicines for multiple months per his sister due to his inability to afford them. 40 pound weight loss over 1 month secondary to this, exhibiting a 21% severe weight loss. Also has bladder wall thickening vs bladder mass/cancer per urology - to undergo cytoscopy today for prostate abscess, pyelonephritis. Possible metastases to R middle lobe of lung.   Spoke with patient, patient's sister at bedside. Patient is somewhat confused per sister but seemed to be able to respond to questions appropriately. He states he was eating 2 times per day along with multiple snacks of watermelon, sodas while his blood sugar was heavily elevated with persistent loose stools. Lives with his parents who are borderline diabetics, was eating a relatively carb controlled diet per sister. Complains his blood sugars got as high as 950. Weight loss was drastic during this time but he continues to exhibit only mild-moderate muscle wasting at temples, and clavicles. Also reports generalized weakness. He is very hungry right now, NPO for cytoscopy and CT of chest. Unable to diagnose malnutrition at this time.  Labs reviewed:   CBGs 301, 198, 112 Na 129, K 3.1  Medications reviewed and include:  Novolog 0-20 Units TID, 0-5 Units HS, Lantus 20 units HS, Glipzide  BID NS at 75mL/hr  Meal Completion: 50-100%   Intake/Output Summary (Last 24 hours) at 07/26/17 1518 Last data filed at 07/26/17 1128  Gross per 24 hour  Intake          2471.96 ml  Output              950 ml  Net          1521.96 ml   Diet Order:  Diet NPO time specified  Skin:  Reviewed, no issues  Last BM:  07/25/2017  Height:   Ht Readings from Last 1 Encounters:  07/24/17  (1.727 m)    Weight:   Wt Readings from Last 1 Encounters:  07/24/17 142 lb (64.4 kg)    Ideal Body Weight:  70 kg  BMI:  Body mass index is 21.59 kg/m.  Estimated Nutritional Needs:   Kcal:  1750-1900 calories (MSJ x1.2-1.3)  Protein:  84-97 grams (1.3-1.5g/kg)  Fluid:  1.8-2L  EDUCATION NEEDS:   Education needs no appropriate at this time  Dionne Ano. Cameron Eastburn, MS, RD LDN Inpatient Clinical Dietitian Pager [phone]

## 2017-07-27 ENCOUNTER — Inpatient Hospital Stay: Payer: Self-pay

## 2017-07-27 ENCOUNTER — Encounter: Payer: Self-pay | Admitting: *Deleted

## 2017-07-27 DIAGNOSIS — R739 Hyperglycemia, unspecified: Secondary | ICD-10-CM

## 2017-07-27 DIAGNOSIS — N390 Urinary tract infection, site not specified: Secondary | ICD-10-CM

## 2017-07-27 DIAGNOSIS — A419 Sepsis, unspecified organism: Secondary | ICD-10-CM

## 2017-07-27 DIAGNOSIS — R339 Retention of urine, unspecified: Secondary | ICD-10-CM

## 2017-07-27 LAB — CBC
HCT: 36.3 % — ABNORMAL LOW (ref 40.0–52.0)
Hemoglobin: 12.4 g/dL — ABNORMAL LOW (ref 13.0–18.0)
MCH: 31.9 pg (ref 26.0–34.0)
MCHC: 34.3 g/dL (ref 32.0–36.0)
MCV: 93.2 fL (ref 80.0–100.0)
Platelets: 136 10*3/uL — ABNORMAL LOW (ref 150–440)
RBC: 3.89 MIL/uL — ABNORMAL LOW (ref 4.40–5.90)
RDW: 14.5 % (ref 11.5–14.5)
WBC: 8.1 10*3/uL (ref 3.8–10.6)

## 2017-07-27 LAB — GLUCOSE, CAPILLARY
GLUCOSE-CAPILLARY: 98 mg/dL (ref 65–99)
GLUCOSE-CAPILLARY: 206 mg/dL — AB (ref 65–99)
Glucose-Capillary: 180 mg/dL — ABNORMAL HIGH (ref 65–99)
Glucose-Capillary: 297 mg/dL — ABNORMAL HIGH (ref 65–99)
Glucose-Capillary: 334 mg/dL — ABNORMAL HIGH (ref 65–99)
Glucose-Capillary: 75 mg/dL (ref 65–99)

## 2017-07-27 LAB — CULTURE, BLOOD (ROUTINE X 2)
SPECIAL REQUESTS: ADEQUATE
Special Requests: ADEQUATE

## 2017-07-27 LAB — URINE CULTURE

## 2017-07-27 LAB — BUN: BUN: 11 mg/dL (ref 6–20)

## 2017-07-27 LAB — CREATININE, SERUM
CREATININE: 0.63 mg/dL (ref 0.61–1.24)
GFR calc Af Amer: >60 mL/min (ref >60)
GFR calc non Af Amer: >60 mL/min (ref >60)

## 2017-07-27 LAB — SEDIMENTATION RATE: SED RATE: 40 mm/h — AB (ref 0–20)

## 2017-07-27 MED ORDER — FENTANYL CITRATE (PF) 100 MCG/2ML IJ SOLN
25.0000 ug | INTRAMUSCULAR | Status: AC | PRN
Start: 2017-07-27 — End: 2017-07-27
  Administered 2017-07-27 (×6): 25 ug via INTRAVENOUS

## 2017-07-27 MED ORDER — INSULIN GLARGINE 100 UNIT/ML ~~LOC~~ SOLN
13.0000 [IU] | Freq: Every day | SUBCUTANEOUS | Status: DC
  Administered 2017-07-27 – 2017-07-28 (×2): 13 [IU] via SUBCUTANEOUS
  Filled 2017-07-27 (×3): qty 0.13

## 2017-07-27 MED ORDER — IOPAMIDOL (ISOVUE-300) INJECTION 61%
75.0000 mL | Freq: Once | INTRAVENOUS | Status: AC | PRN
  Administered 2017-07-27: 75 mL via INTRAVENOUS

## 2017-07-27 MED ORDER — ONDANSETRON HCL 4 MG/2ML IJ SOLN: 4.0000 mg | Freq: Once | INTRAMUSCULAR | Status: DC | PRN

## 2017-07-27 MED ORDER — FENTANYL CITRATE (PF) 100 MCG/2ML IJ SOLN
INTRAMUSCULAR | Status: AC
  Administered 2017-07-27: 25 ug via INTRAVENOUS
  Filled 2017-07-27: qty 2

## 2017-07-27 MED ORDER — ACETAMINOPHEN 325 MG PO TABS
ORAL_TABLET | ORAL | Status: AC
  Administered 2017-07-27: 650 mg via ORAL
  Filled 2017-07-27: qty 2

## 2017-07-27 MED ORDER — ENOXAPARIN SODIUM 40 MG/0.4ML ~~LOC~~ SOLN
40.0000 mg | SUBCUTANEOUS | Status: DC
  Administered 2017-07-27 – 2017-07-28 (×2): 40 mg via SUBCUTANEOUS
  Filled 2017-07-27 (×2): qty 0.4

## 2017-07-27 NOTE — Progress Notes (Addendum)
PICC ordered 9-17 but per Dr. Sampson Goon no placement until cultures are negative 72 hours.  Amy, RN aware of this plan of care and that PICC will not be placed until approved by Infectious Disease doctor.  VAST also needs to have ordered changed to SL instead of DL for D/C from hospital with PICC.  Gasper Lloyd, RN VAST

## 2017-07-27 NOTE — Progress Notes (Signed)
[facility] CLINIC INFECTIOUS DISEASE PROGRESS NOTE Date of Admission:  07/24/2017     ID: Cameron Coffey is a 56 y.o. male with  MRSA bacteremia  Active Problems:   Sepsis (HCC)   Subjective: Had fever last pm after drainage of prostate abscess.  ROS  Eleven systems are reviewed and negative except per hpi  Medications:  Antibiotics Given (last 72 hours)    Date/Time Action Medication Dose Rate   07/24/17 2155 New Bag/Given   piperacillin-tazobactam (ZOSYN) IVPB 3.375 g 3.375 g 12.5 mL/hr   07/25/17 0536 New Bag/Given   piperacillin-tazobactam (ZOSYN) IVPB 3.375 g 3.375 g 12.5 mL/hr   07/25/17 1304 New Bag/Given   piperacillin-tazobactam (ZOSYN) IVPB 3.375 g 3.375 g 12.5 mL/hr   07/25/17 2301 New Bag/Given   piperacillin-tazobactam (ZOSYN) IVPB 3.375 g 3.375 g 12.5 mL/hr   07/26/17 0548 New Bag/Given   piperacillin-tazobactam (ZOSYN) IVPB 3.375 g 3.375 g 12.5 mL/hr   07/26/17 1256 New Bag/Given   piperacillin-tazobactam (ZOSYN) IVPB 3.375 g 3.375 g 12.5 mL/hr   07/26/17 2100 New Bag/Given   piperacillin-tazobactam (ZOSYN) IVPB 3.375 g 3.375 g 12.5 mL/hr   07/27/17 0534 New Bag/Given   piperacillin-tazobactam (ZOSYN) IVPB 3.375 g 3.375 g 12.5 mL/hr   07/27/17 1330 New Bag/Given   piperacillin-tazobactam (ZOSYN) IVPB 3.375 g 3.375 g 12.5 mL/hr     . aspirin  81 mg Oral Daily  . enoxaparin (LOVENOX) injection  40 mg Subcutaneous Q24H  . glipiZIDE  10 mg Oral BID AC  . insulin aspart  0-20 Units Subcutaneous TID WC  . insulin aspart  0-5 Units Subcutaneous QHS  . insulin glargine  13 Units Subcutaneous QHS  . lisinopril  5 mg Oral Daily  . metoprolol tartrate  12.5 mg Oral BID  . simvastatin  40 mg Oral Daily    Objective: Vital signs in last 24 hours: Temp:  [97.6 F (36.4 C)-102.8 F (39.3 C)] 98.7 F (37.1 C) (09/18 2030) Pulse Rate:  [84-125] 89 (09/18 2030) Resp:  [17-20] 18 (09/18 2030) BP: (96-164)/(56-101) 111/68 (09/18 2030) SpO2:  [95 %-100 %] 99 % (09/18  2030) Constitutional: He is oriented to person, place, and time. Ill appearing HENT: anicteric  Mouth/Throat: Oropharynx is clear and dry . No oropharyngeal exudate.  Cardiovascular: Normal rate, regular rhythm and normal heart sounds. Pulmonary/Chest: Effort normal and breath sounds normal. No respiratory distress. He has no wheezes.  Abdominal: Soft. Bowel sounds are normal. He exhibits no distension. There is no tenderness. Lymphadenopathy: He has no cervical adenopathy.  Neurological: He is alert and oriented to person, place, and time.  Skin: L upper back has an area of induration and mild ttp. No drainage.  Psychiatric: He has a normal mood and affect. His behavior is normal.   Lab Results  Recent Labs  07/25/17 0308 07/27/17 1300 07/27/17 1444  WBC 15.4* 8.1  --   HGB 14.5 12.4*  --   HCT 42.4 36.3*  --   NA 129*  --   --   K 3.1*  --   --   CL 94*  --   --   CO2 27  --   --   BUN 12  --  11  CREATININE 0.61  --  0.63    Microbiology: Results for orders placed or performed during the hospital encounter of 07/24/17  Blood culture (routine x 2)     Status: Abnormal   Collection Time: 07/24/17  4:24 AM  Result Value Ref Range Status  Specimen Description BLOOD LEFT HAND  Final   Special Requests   Final    BOTTLES DRAWN AEROBIC AND ANAEROBIC Blood Culture adequate volume   Culture  Setup Time   Final    GRAM POSITIVE COCCI IN BOTH AEROBIC AND ANAEROBIC BOTTLES CRITICAL RESULT CALLED TO, READ BACK BY AND VERIFIED WITH: [facility] HALLAJI AT 1938 07/24/17.PMH    Culture (A)  Final    STAPHYLOCOCCUS AUREUS SUSCEPTIBILITIES PERFORMED ON PREVIOUS CULTURE WITHIN THE LAST 5 DAYS. Performed at [facility], [address] [place], [place] [address]    Report Status 07/27/2017 FINAL  Final  Blood culture (routine x 2)     Status: Abnormal   Collection Time: 07/24/17  4:24 AM  Result Value Ref Range Status   Specimen Description BLOOD RIGHT HAND  Final   Special  Requests   Final    BOTTLES DRAWN AEROBIC AND ANAEROBIC Blood Culture adequate volume   Culture  Setup Time   Final    GRAM POSITIVE COCCI IN BOTH AEROBIC AND ANAEROBIC BOTTLES CRITICAL RESULT CALLED TO, READ BACK BY AND VERIFIED WITH: [facility] HALLAJI AT 1938 07/24/17.PMH    Culture STAPHYLOCOCCUS AUREUS (A)  Final   Report Status 07/27/2017 FINAL  Final   Organism ID, Bacteria STAPHYLOCOCCUS AUREUS  Final      Susceptibility   Staphylococcus aureus - MIC*    CIPROFLOXACIN <=0.5 SENSITIVE Sensitive     ERYTHROMYCIN <=0.25 SENSITIVE Sensitive     GENTAMICIN <=0.5 SENSITIVE Sensitive     OXACILLIN 0.5 SENSITIVE Sensitive     TETRACYCLINE <=1 SENSITIVE Sensitive     VANCOMYCIN <=0.5 SENSITIVE Sensitive     TRIMETH/SULFA <=10 SENSITIVE Sensitive     CLINDAMYCIN <=0.25 SENSITIVE Sensitive     RIFAMPIN <=0.5 SENSITIVE Sensitive     Inducible Clindamycin NEGATIVE Sensitive     * STAPHYLOCOCCUS AUREUS  Blood Culture ID Panel (Reflexed)     Status: Abnormal   Collection Time: 07/24/17  4:24 AM  Result Value Ref Range Status   Enterococcus species NOT DETECTED NOT DETECTED Final   Listeria monocytogenes NOT DETECTED NOT DETECTED Final   Staphylococcus species DETECTED (A) NOT DETECTED Final    Comment: CRITICAL RESULT CALLED TO, READ BACK BY AND VERIFIED WITH: SHEEMA HALLAJI AT 1938 07/24/17.PMH    Staphylococcus aureus DETECTED (A) NOT DETECTED Final    Comment: Methicillin (oxacillin) susceptible Staphylococcus aureus (MSSA). Preferred therapy is anti staphylococcal beta lactam antibiotic (Cefazolin or Nafcillin), unless clinically contraindicated. CRITICAL RESULT CALLED TO, READ BACK BY AND VERIFIED WITH: [facility] HALLAJI AT 1938 07/24/17.PMH    Methicillin resistance NOT DETECTED NOT DETECTED Final   Streptococcus species NOT DETECTED NOT DETECTED Final   Streptococcus agalactiae NOT DETECTED NOT DETECTED Final   Streptococcus pneumoniae NOT DETECTED NOT DETECTED Final   Streptococcus  pyogenes NOT DETECTED NOT DETECTED Final   Acinetobacter baumannii NOT DETECTED NOT DETECTED Final   Enterobacteriaceae species NOT DETECTED NOT DETECTED Final   Enterobacter cloacae complex NOT DETECTED NOT DETECTED Final   Escherichia coli NOT DETECTED NOT DETECTED Final   Klebsiella oxytoca NOT DETECTED NOT DETECTED Final   Klebsiella pneumoniae NOT DETECTED NOT DETECTED Final   Proteus species NOT DETECTED NOT DETECTED Final   Serratia marcescens NOT DETECTED NOT DETECTED Final   Haemophilus influenzae NOT DETECTED NOT DETECTED Final   Neisseria meningitidis NOT DETECTED NOT DETECTED Final   Pseudomonas aeruginosa NOT DETECTED NOT DETECTED Final   Candida albicans NOT DETECTED NOT DETECTED Final  Candida glabrata NOT DETECTED NOT DETECTED Final   Candida krusei NOT DETECTED NOT DETECTED Final   Candida parapsilosis NOT DETECTED NOT DETECTED Final   Candida tropicalis NOT DETECTED NOT DETECTED Final  Urine Culture     Status: Abnormal   Collection Time: 07/25/17  3:34 AM  Result Value Ref Range Status   Specimen Description URINE, RANDOM  Final   Special Requests NONE  Final   Culture >=100,000 COLONIES/mL STAPHYLOCOCCUS AUREUS (A)  Final   Report Status 07/27/2017 FINAL  Final   Organism ID, Bacteria STAPHYLOCOCCUS AUREUS (A)  Final      Susceptibility   Staphylococcus aureus - MIC*    CIPROFLOXACIN <=0.5 SENSITIVE Sensitive     GENTAMICIN <=0.5 SENSITIVE Sensitive     NITROFURANTOIN <=16 SENSITIVE Sensitive     OXACILLIN <=0.25 SENSITIVE Sensitive     TETRACYCLINE <=1 SENSITIVE Sensitive     VANCOMYCIN 1 SENSITIVE Sensitive     TRIMETH/SULFA <=10 SENSITIVE Sensitive     CLINDAMYCIN <=0.25 SENSITIVE Sensitive     RIFAMPIN <=0.5 SENSITIVE Sensitive     Inducible Clindamycin NEGATIVE Sensitive     * >=100,000 COLONIES/mL STAPHYLOCOCCUS AUREUS  Culture, blood (Routine X 2) w Reflex to ID Panel     Status: None (Preliminary result)   Collection Time: 07/25/17  5:17 PM  Result  Value Ref Range Status   Specimen Description BLOOD RIGHT ANTECUBITAL  Final   Special Requests   Final    BOTTLES DRAWN AEROBIC AND ANAEROBIC Blood Culture adequate volume   Culture  Setup Time   Final    GRAM POSITIVE COCCI IN BOTH AEROBIC AND ANAEROBIC BOTTLES CRITICAL RESULT CALLED TO, READ BACK BY AND VERIFIED WITH: KAREN HAYES AT 1444 ON 07/26/2017 JJB    Culture GRAM POSITIVE COCCI  Final   Report Status PENDING  Incomplete  Culture, blood (Routine X 2) w Reflex to ID Panel     Status: None (Preliminary result)   Collection Time: 07/25/17  5:25 PM  Result Value Ref Range Status   Specimen Description BLOOD LEFT ANTECUBITAL  Final   Special Requests   Final    BOTTLES DRAWN AEROBIC AND ANAEROBIC Blood Culture results may not be optimal due to an excessive volume of blood received in culture bottles   Culture NO GROWTH 2 DAYS  Final   Report Status PENDING  Incomplete  Culture, blood (single) w Reflex to ID Panel     Status: None (Preliminary result)   Collection Time: 07/26/17  4:54 PM  Result Value Ref Range Status   Specimen Description BLOOD LEFT ANTECUBITAL  Final   Special Requests   Final    BOTTLES DRAWN AEROBIC AND ANAEROBIC Blood Culture adequate volume   Culture NO GROWTH < 24 HOURS  Final   Report Status PENDING  Incomplete     Studies/Results: Ct Abdomen Pelvis W Wo Contrast  Result Date: 07/26/2017 CLINICAL DATA:  Hematuria, weight loss, abdominal pain. Urinary tract infection. EXAM: CT ABDOMEN AND PELVIS WITHOUT AND WITH CONTRAST TECHNIQUE: Multidetector CT imaging of the abdomen and pelvis was performed following the standard protocol before and following the bolus administration of intravenous contrast. CONTRAST:  ISOVUE-300 IOPAMIDOL (ISOVUE-300) INJECTION 61% COMPARISON:  None. FINDINGS: Lower chest: 8 mm nodule seen in the right middle lobe. Image quality is degraded by respiratory motion. Small left pleural effusion collapse/ consolidation in the left  lower lobe. Heart size normal. No pericardial effusion. Distal esophagus is grossly unremarkable. Hepatobiliary: Liver margin is markedly  irregular. Stones are seen in the gallbladder. No biliary ductal dilatation. Pancreas: Negative. Spleen: Negative. Adrenals/Urinary Tract: Adrenal glands are unremarkable. Stones are seen in the kidneys bilaterally. There is a striated appearance in both kidneys with areas of ill-defined low attenuation on portal venous phase imaging. 1.3 cm low-attenuation lesion is seen in the interpolar left kidney (series 8, image 31), too small to characterize. On delayed imaging there is low attenuation material in the nondependent portion of the bladder. Stomach/Bowel: Stomach, small bowel, appendix and colon are grossly unremarkable. Vascular/Lymphatic: Atherosclerotic calcification of the arterial vasculature without abdominal aortic aneurysm. Borderline enlarged porta hepatis lymph nodes, likely reactive. Reproductive: Prostate is enlarged and contains a large area of low-attenuation, measuring approximately 3.9 x 4.1 cm. Other: Moderate ascites.  Mild presacral edema. Musculoskeletal: Diffuse body wall edema. There are multiple fluid collections within the chest wall musculature, some of which are best seen on nephrographic phase imaging and some of which have peripheral high attenuation on portal venous phase imaging. Index collection in the left lateral chest wall measures 3.3 cm (incompletely imaged, series 5, image 1). A 1.3 cm low-attenuation collection with peripheral high attenuation is seen in the high right thigh musculature (series 5, image 76). No worrisome lytic or sclerotic lesions. IMPRESSION: 1. Organized fluid collections within the prostate and soft tissue musculature, highly worrisome for abscesses. 2. Striated appearance of both kidneys, most indicative of pyelonephritis. Difficult to exclude developing abscesses. 3. Debris in the bladder.  Difficult to exclude a  mass. 4. Right middle lobe nodule. Metastatic disease is not excluded. Continued attention on follow-up is recommended. 5. Cirrhosis with moderate ascites. 6. Small left pleural effusion with collapse/consolidation in the left lower lobe. 7. Bilateral renal stones. 8. Cholelithiasis. 9.  Aortic atherosclerosis (ICD10-170.0). Electronically Signed   By: Leanna Battles M.D.   On: 07/26/2017 08:03   Ct Chest W Contrast  Result Date: 07/27/2017 CLINICAL DATA:  History of pneumonia, followup, former smoking history EXAM: CT CHEST WITH CONTRAST TECHNIQUE: Multidetector CT imaging of the chest was performed during intravenous contrast administration. CONTRAST:  75mL ISOVUE-300 IOPAMIDOL (ISOVUE-300) INJECTION 61% COMPARISON:  Chest x-ray of 07/24/2017 FINDINGS: Cardiovascular: Variation of an aberrant right subclavian artery is noted coursing posterior to the esophagus. The thoracic aorta is well opacified with no acute abnormality noted. Mild thoracic aortic atherosclerotic change present. The pulmonary arteries are not as well opacified but no abnormality is seen. There are coronary artery calcifications primarily in the distribution of the left anterior descending. The heart is within upper limits of normal. No pericardial effusion is seen. The mid ascending thoracic aorta measures 31 mm in diameter. Mediastinum/Nodes: No mediastinal or hilar adenopathy is seen the thyroid gland is normal in size. Lungs/Pleura: On lung window images, there is a moderate size left pleural effusion present. There may be a small amount air within this collection of fluid posterior, and followup is recommended. Opacity posteriorly in the left lower lobe is most consistent with atelectasis or pneumonia. A few air bronchograms are noted through this area of dense partial consolidation. Mild atelectasis is noted posteriorly at the right lung base. There is a pleural-based nodular opacity anteriorly in the right middle lobe which on axial  image 85 series 3 measures 8 mm, being slightly more prominent on sagittal images. A developing lung neoplasm cannot be excluded and continued followup is recommended in 4-6 months by CT the chest. Upper Abdomen: There does appear to be ascites within the upper abdomen. There is protrusion of  the anterior margin of the left lobe of liver as on image 130 series 2 and a hepatic mass cannot be excluded. Abdominal ultrasound may be helpful to assess, but MR of the abdomen would be preferable to evaluate this area further. Musculoskeletal: The thoracic vertebrae are in normal alignment with no compression deformity noted. IMPRESSION: 1. Moderate size left pleural effusion. Small amount air may be within this fluid collection and continued followup is recommended. 2. Opacity posteriorly in the left lower lobe consistent with atelectasis or pneumonia. 3. 8 mm pleural-based opacity in the anterior right middle lobe. Cannot exclude neoplasm. Recommend CT of the chest is followup in 4-6 months. 4. Possible hepatic lesion anteriorly in the left lobe. Consider ultrasound or preferably MRI to assess further. Ascites in the upper abdomen. 5. Aberrant right subclavian artery. Electronically Signed   By: Dwyane Dee M.D.   On: 07/27/2017 16:10    Assessment/Plan: Cameron Coffey is a 56 y.o. male admitted with fevers and weakness as well as dysuria. On admit elevated lactic acid, dhydration, and wbc 17 and febirle to 100.2. Has had a general decline in last few weeks as well. BCX + MSSA.  CXR neg, Pelvic USS with possible bladder mass or inflammation. UA On admit with TNTC WBC. UCX Staph.  CT scan with likley prostate abscess, possible pyelo, multiple soft tissue abscesse as well as RML nodule and cirrhosis with mod ascites. Has renal stones as well.   L upper back with area of swelling, had recent L facial abscess he self drained High risk of endocarditis and seeding of multiple sites. However TTE negative and good images  so can likely avoid TEE at this point.   S.p Turp 9.17 Will need to monitor for need to drain sub Q abscesses.  Recommendations Continue  ancef Repeat bcx as fu 9/16 was +., bcx 9/17 NGTD Consider TEE if worsens Will review CT and see if needs drainage of abscesses in musculature Will need 6 weeks IV abx but will hold on picc placement until bcx neg 72 hours.  Thank you very much for the consult. Will follow with you.  Mick Sell   07/27/2017, 9:13 PM

## 2017-07-27 NOTE — NC FL2 (Signed)
[place] MEDICAID FL2 LEVEL OF CARE SCREENING TOOL     IDENTIFICATION  Patient Name: Cameron Coffey Birthdate: 06/14/1961 Sex: male Admission Date (Current Location): 07/24/2017  [facility] and [place] Number:  Engineer, civil (consulting) and Address:  [facility], [address] [place], [place] [address]      Provider Number: [id number]  Attending Physician Name and Address:  Katha Hamming, MD  Relative Name and Phone Number:  Burlene Arnt [phone] or Robin Searing   [phone]     Current Level of Care: Hospital Recommended Level of Care: Skilled Nursing Facility Prior Approval Number:    Date Approved/Denied:   PASRR Number: [id number]  Discharge Plan: SNF    Current Diagnoses: Patient Active Problem List   Diagnosis Date Noted  . Sepsis (HCC) 07/24/2017  . Type 2 diabetes mellitus without complication (HCC) 05/28/2015  . HLD (hyperlipidemia) 05/28/2015  . Essential hypertension 05/28/2015    Orientation RESPIRATION BLADDER Height & Weight     Self, Time, Situation, Place  Normal Continent Weight: 142 lb (64.4 kg) Height:   (172.7 cm)  BEHAVIORAL SYMPTOMS/MOOD NEUROLOGICAL BOWEL NUTRITION STATUS      Continent Diet (2g sodium diet)  AMBULATORY STATUS COMMUNICATION OF NEEDS Skin   Limited Assist Verbally Surgical wounds                       Personal Care Assistance Level of Assistance  Bathing, Feeding, Dressing Bathing Assistance: Limited assistance Feeding assistance: Independent Dressing Assistance: Limited assistance     Functional Limitations Info  Sight, Hearing, Speech Sight Info: Adequate Hearing Info: Adequate Speech Info: Adequate    SPECIAL CARE FACTORS FREQUENCY  PT (By licensed PT)     PT Frequency: minimum 2x a week              Contractures Contractures Info: Not present    Additional Factors Info  Code Status, Allergies, Insulin Sliding Scale Code Status Info:  Full Code Allergies Info: NKA   Insulin Sliding Scale Info: insulin aspart (novoLOG) injection 0-20 Units 3x a day with meals       Current Medications (07/27/2017):  This is the current hospital active medication list Current Facility-Administered Medications  Medication Dose Route Frequency Provider Last Rate Last Dose  . 0.9 %  sodium chloride infusion   Intravenous Continuous Katha Hamming, MD 75 mL/hr at 07/27/17 0126    . acetaminophen (TYLENOL) tablet 650 mg  650 mg Oral Q6H PRN Ihor Austin, MD   650 mg at 07/27/17 0037   Or  . acetaminophen (TYLENOL) suppository 650 mg  650 mg Rectal Q6H PRN Pyreddy, Vivien Rota, MD      . aspirin chewable tablet 81 mg  81 mg Oral Daily Katha Hamming, MD   81 mg at 07/27/17 0816  . enoxaparin (LOVENOX) injection 40 mg  40 mg Subcutaneous Q24H Katha Hamming, MD      . glipiZIDE (GLUCOTROL) tablet 10 mg  10 mg Oral BID AC Pyreddy, Pavan, MD   10 mg at 07/27/17 0800  . HYDROcodone-acetaminophen (NORCO/VICODIN) 5-325 MG per tablet 1-2 tablet  1-2 tablet Oral Q4H PRN Ihor Austin, MD   2 tablet at 07/25/17 2059  . insulin aspart (novoLOG) injection 0-20 Units  0-20 Units Subcutaneous TID WC Katha Hamming, MD   15 Units at 07/27/17 1200  . insulin aspart (novoLOG) injection 0-5 Units  0-5 Units Subcutaneous QHS Pyreddy, Pavan, MD      . insulin glargine (LANTUS)  injection 13 Units  13 Units Subcutaneous QHS Katha Hamming, MD      . lisinopril (PRINIVIL,ZESTRIL) tablet 5 mg  5 mg Oral Daily Pyreddy, Vivien Rota, MD   5 mg at 07/26/17 0911  . metoprolol tartrate (LOPRESSOR) tablet 12.5 mg  12.5 mg Oral BID Katha Hamming, MD   12.5 mg at 07/26/17 2156  . ondansetron (ZOFRAN) tablet 4 mg  4 mg Oral Q6H PRN Pyreddy, Vivien Rota, MD       Or  . ondansetron (ZOFRAN) injection 4 mg  4 mg Intravenous Q6H PRN Pyreddy, Pavan, MD      . piperacillin-tazobactam (ZOSYN) IVPB 3.375 g  3.375 g Intravenous Q8H Ihor Austin, MD   Stopped at  07/27/17 0934  . senna-docusate (Senokot-S) tablet 1 tablet  1 tablet Oral QHS PRN Pyreddy, Vivien Rota, MD      . simvastatin (ZOCOR) tablet 40 mg  40 mg Oral Daily Pyreddy, Vivien Rota, MD   40 mg at 07/27/17 1610     Discharge Medications: Please see discharge summary for a list of discharge medications.  Relevant Imaging Results:  Relevant Lab Results:   Additional Information SSN [id number]  Darleene Cleaver, [place]

## 2017-07-27 NOTE — Progress Notes (Signed)
[facility] - [place] [facility]   PATIENT NAME: Cameron Coffey    MR#:  [id number]  DATE OF BIRTH:  04/27/1961  SUBJECTIVE:  Patient has MSSA bacteremia.drainage  Of  Prostate abscess yesterday, now has a Foley. Draining orange colored urine.  Rpt Blood cultures showed negative for 48 hours. Patient told me again today that he has no home and wants to go to rehabilitation. Denies any other complaints. Temperature 102 Fahrenheit this morning.   CHIEF COMPLAINT:   Chief Complaint  Patient presents with  . Fever  . Hyperglycemia    REVIEW OF SYSTEMS:    Review of Systems  Constitutional: Negative for chills and fever.  HENT: Negative for hearing loss.   Eyes: Negative for blurred vision, double vision and photophobia.  Respiratory: Negative for cough, hemoptysis and shortness of breath.   Cardiovascular: Negative for palpitations, orthopnea and leg swelling.  Gastrointestinal: Negative for abdominal pain, diarrhea and vomiting.  Genitourinary: Negative for dysuria and urgency.  Musculoskeletal: Positive for back pain and myalgias. Negative for neck pain.  Skin: Negative for rash.  Neurological: Positive for weakness. Negative for dizziness, focal weakness, seizures and headaches.  Psychiatric/Behavioral: Negative for memory loss. The patient does not have insomnia.     Nutrition: Tolerating Diet: Tolerating PT:      DRUG ALLERGIES:  No Known Allergies  VITALS:  Blood pressure 103/71, pulse 84, temperature 97.6 F (36.4 C), temperature source Oral, resp. rate 18, height 5\' 8"  (1.727 m), weight 64.4 kg (142 lb), SpO2 97 %.  PHYSICAL EXAMINATION:   Physical Exam  GENERAL:  56 y.o.-year-old patient lying in the bed with no acute distress. Appears ill. EYES: Pupils equal, round, reactive to light   No scleral icterus. Extraocular muscles intact.  HEENT: Head atraumatic, normocephalic. Oropharynx and nasopharynx clear.  NECK:  Supple, no  jugular venous distention. No thyroid enlargement, no tenderness.  LUNGS: Normal breath sounds bilaterally, no wheezing, rales,rhonchi or crepitation. No use of accessory muscles of respiration.  CARDIOVASCULAR: S1, S2 normal. No murmurs, rubs, or gallops.  ABDOMEN: Soft, nontender, nondistended. Bowel sounds present. No organomegaly or mass. Foley present, and  Draining blood tinged urine. EXTREMITIES: No pedal edema, cyanosis, or clubbing.  NEUROLOGIC: Cranial nerves II through XII are intact. Muscle strength 5/5 in all extremities. Sensation intact. Gait not checked. Patient complains of upper back pain, noted to have small left lipoma on the right scapular region. PSYCHIATRIC: The patient is alert and oriented x 3.  SKIN: No obvious rash, lesion, or ulcer.    LABORATORY PANEL:   CBC  Recent Labs Lab 07/25/17 0308  WBC 15.4*  HGB 14.5  HCT 42.4  PLT 153   ------------------------------------------------------------------------------------------------------------------  Chemistries   Recent Labs Lab 07/25/17 0308  NA 129*  K 3.1*  CL 94*  CO2 27  GLUCOSE 93  BUN 12  CREATININE 0.61  CALCIUM 7.9*   ------------------------------------------------------------------------------------------------------------------  Cardiac Enzymes  Recent Labs Lab 07/24/17 1223  TROPONINI 0.04*   ------------------------------------------------------------------------------------------------------------------  RADIOLOGY:  Ct Abdomen Pelvis W Wo Contrast  Result Date: 07/26/2017 CLINICAL DATA:  Hematuria, weight loss, abdominal pain. Urinary tract infection. EXAM: CT ABDOMEN AND PELVIS WITHOUT AND WITH CONTRAST TECHNIQUE: Multidetector CT imaging of the abdomen and pelvis was performed following the standard protocol before and following the bolus administration of intravenous contrast. CONTRAST:  <[facility]>T> ISOVUE-300 IOPAMIDOL (ISOVUE-300) INJECTION 61% COMPARISON:  None. FINDINGS: Lower  chest: 8 mm nodule seen in the right middle lobe. Image quality is  degraded by respiratory motion. Small left pleural effusion collapse/ consolidation in the left lower lobe. Heart size normal. No pericardial effusion. Distal esophagus is grossly unremarkable. Hepatobiliary: Liver margin is markedly irregular. Stones are seen in the gallbladder. No biliary ductal dilatation. Pancreas: Negative. Spleen: Negative. Adrenals/Urinary Tract: Adrenal glands are unremarkable. Stones are seen in the kidneys bilaterally. There is a striated appearance in both kidneys with areas of ill-defined low attenuation on portal venous phase imaging. 1.3 cm low-attenuation lesion is seen in the interpolar left kidney (series 8, image 31), too small to characterize. On delayed imaging there is low attenuation material in the nondependent portion of the bladder. Stomach/Bowel: Stomach, small bowel, appendix and colon are grossly unremarkable. Vascular/Lymphatic: Atherosclerotic calcification of the arterial vasculature without abdominal aortic aneurysm. Borderline enlarged porta hepatis lymph nodes, likely reactive. Reproductive: Prostate is enlarged and contains a large area of low-attenuation, measuring approximately 3.9 x 4.1 cm. Other: Moderate ascites.  Mild presacral edema. Musculoskeletal: Diffuse body wall edema. There are multiple fluid collections within the chest wall musculature, some of which are best seen on nephrographic phase imaging and some of which have peripheral high attenuation on portal venous phase imaging. Index collection in the left lateral chest wall measures 3.3 cm (incompletely imaged, series 5, image 1). A 1.3 cm low-attenuation collection with peripheral high attenuation is seen in the high right thigh musculature (series 5, image 76). No worrisome lytic or sclerotic lesions. IMPRESSION: 1. Organized fluid collections within the prostate and soft tissue musculature, highly worrisome for abscesses. 2.  Striated appearance of both kidneys, most indicative of pyelonephritis. Difficult to exclude developing abscesses. 3. Debris in the bladder.  Difficult to exclude a mass. 4. Right middle lobe nodule. Metastatic disease is not excluded. Continued attention on follow-up is recommended. 5. Cirrhosis with moderate ascites. 6. Small left pleural effusion with collapse/consolidation in the left lower lobe. 7. Bilateral renal stones. 8. Cholelithiasis. 9.  Aortic atherosclerosis (ICD10-170.0). Electronically Signed   By: Leanna Battles M.D.   On: 07/26/2017 08:03     ASSESSMENT AND PLAN:   Active Problems:   Sepsis (HCC)   1 .staph aureus bacteremia, urine culture showed staph aureus. First blood culture showed MSSA.Repeat blood cultures No growth for 2 days. seen by Dr. Sampson Goon. If repeat blood cultures showing no infection for 72 hours then he needs PICC line after that for   IV Ancef for 2-4 weeks.discussed with the patient.     #2. hyponatremia due to dehydration: Improving with IV fluids .check Chem-7 today.  3.Hyperkalemia;Status post Kayexalate. Hyperkalemia resolved.  #4. diabetes mellitus type 2 poorly controlled, hemoglobin A1c is 14. Seen by diabetes coordinator. Patient cannot afford Lantus due to cost. So we will give 70/30 10 units twice a day.  #5. Elevated troponin secondary to demand ischemia from sepsis. Seen by cardiology. Echocardiogram is pending. Troponin levels are coming down. H  echocardiogram." Shows no wall motion abnormality discontinue heparin.  #6 /prostate abscess, pyelonephritis: Status post drainage of prostate abscess yesterday by urology, now has a Foley. Continue Foley for 2 weeks as per urologyrecommendation, follow prostate chips for biopsy.  Left lobe pneumonia. Zosyn should cover that. Right middle lobe nodule, metastasis not excluded, get CT chest with contrast..  Overall prognosis . Weight loss, pyelonephritis, poorly controlled diabetes mellitus,  bacteremia  #8. Deconditioning; patient told me he is homeless:  Physical therapy recommends SNF.  All records are reviewed and case discussed with Care Management/Social Workerr. Management plans discussed with the patient,  family and they are in agreement.  CODE STATUS: full  TOTAL TIME TAKING CARE OF THIS PATIENT: 35 minutes.   POSSIBLE D/C IN 1-2DAYS, DEPENDING ON CLINICAL CONDITION.   Katha Hamming M.D on 07/27/2017 at 11:43 AM  Between 7am to 6pm - Pager - [phone]  After 6pm go to [email or web address] - password [email or web address] [facility]  [facility]  Office  [phone]  CC: Primary care physician; Lorre Munroe, NP

## 2017-07-27 NOTE — Plan of Care (Signed)
Problem: Pain Managment: Goal: General experience of comfort will improve Outcome: Progressing No complaints of pain after TURP procedure, I did advance pt's diet, so he could eat since he had not eaten since breakfast yesterday. Pt tolerated well. Foley in place, draining clear red urine. IV fluids infusing  Also receiving IV abx. Pt did have fever after procedure, but OR RN gave tylenol before coming back to floor, temperature now back down to 98.9

## 2017-07-27 NOTE — Progress Notes (Signed)
Inpatient Diabetes Program Recommendations  AACE/ADA: New Consensus Statement on Inpatient Glycemic Control (2015)  Target Ranges:  Prepandial:   less than 140 mg/dL      Peak postprandial:   less than 180 mg/dL (1-2 hours)      Critically ill patients:  140 - 180 mg/dL   Lab Results  Component Value Date   GLUCAP 98 07/27/2017   HGBA1C 14.5 (H) 07/24/2017    Review of Glycemic Control   Results for [name], [name] (MRN [id number]) as of 07/27/2017 08:04  Ref. Range 07/26/2017 16:39 07/26/2017 20:47 07/27/2017 00:08 07/27/2017 01:00 07/27/2017 07:44  Glucose-Capillary Latest Ref Range: 65 - 99 mg/dL 213 (H) 81 75 98 086 (H)    Diabetes history: DM2 Outpatient Diabetes medications: Glipizide 10 mg BID, Metformin 1000 mg BID Current orders for Inpatient glycemic control: Lantus 20 units QHS, Novlog 0-20 units TID with meals, Novolog 0-5 units QHS, Glipizide 10 mg BID  Inpatient Diabetes Program Recommendations:  Insulin - Basal: In reviewing chart, noted Lantus was was NOT GIVEN last night or the night before and fasting glucose 206 mg/dl today.   Spoke to RN, N.Green case manager regarding this patient for discharge; Although the patient has no insurance, he very likely cannot afford to pay for 70/30 either. They are attempting to place him in a skilled nursing facility. Continue Lantus for now, consider decreasing dose to 13 units (0.2units/kg) - since he has not received the Lantus for 2 days, consider beginning it this morning and continue qam.  Consider decreasing Novolog correction insulin to moderate correction 0-15 units tid.  Cameron Racer, RN, BA, MHA, CDE Diabetes Coordinator Inpatient Diabetes Program  [phone] (Team Pager) [phone] [facility] Office) 07/27/2017 8:20 AM

## 2017-07-27 NOTE — Anesthesia Postprocedure Evaluation (Signed)
Anesthesia Post Note  Patient: Cameron Coffey  Procedure(s) Performed: Procedure(s) (LRB): TRANSURETHRAL RESECTION OF THE PROSTATE (TURP) WITH UPROOFING OF PROSTATE ABSCESS (N/A)  Patient location during evaluation: PACU Anesthesia Type: General Level of consciousness: awake and alert Pain management: pain level controlled Vital Signs Assessment: post-procedure vital signs reviewed and stable Respiratory status: spontaneous breathing, nonlabored ventilation, respiratory function stable and patient connected to nasal cannula oxygen Cardiovascular status: blood pressure returned to baseline and stable Postop Assessment: no apparent nausea or vomiting Anesthetic complications: no     Last Vitals:  Vitals:   07/27/17 0056 07/27/17 0151  BP: 117/74 101/61  Pulse: (!) 108 (!) 106  Resp: 19 18  Temp: (!) 39.1 C (!) 38.7 C  SpO2: 96% 99%    Last Pain:  Vitals:   07/27/17 0151  TempSrc: Oral  PainSc:                  [name]

## 2017-07-27 NOTE — Progress Notes (Signed)
Urology Consult Follow Up  Subjective: POD # 1 s/p TURP with uproofing of prostate abscess.  Patient without complaints this a.m.  He did spike a fever last night of 102.8.  Currently afebrile with Tylenol.  Urine is a clear pink.  ID to consult.    Anti-infectives: Anti-infectives    Start     Dose/Rate Route Frequency Ordered Stop   07/24/17 1330  piperacillin-tazobactam (ZOSYN) IVPB 3.375 g     3.375 g 12.5 mL/hr over 240 Minutes Intravenous Every 8 hours 07/24/17 0640     07/24/17 0530  piperacillin-tazobactam (ZOSYN) IVPB 3.375 g     3.375 g 100 mL/hr over 30 Minutes Intravenous  Once 07/24/17 0526 07/24/17 0542   07/24/17 0530  vancomycin (VANCOCIN) IVPB 1000 mg/200 mL premix     1,000 mg 200 mL/hr over 60 Minutes Intravenous  Once 07/24/17 0526 07/24/17 0648      Current Facility-Administered Medications  Medication Dose Route Frequency Provider Last Rate Last Dose  . 0.9 %  sodium chloride infusion   Intravenous Continuous Katha Hamming, MD 75 mL/hr at 07/27/17 0126    . acetaminophen (TYLENOL) tablet 650 mg  650 mg Oral Q6H PRN Ihor Austin, MD   650 mg at 07/27/17 0037   Or  . acetaminophen (TYLENOL) suppository 650 mg  650 mg Rectal Q6H PRN Pyreddy, Vivien Rota, MD      . aspirin chewable tablet 81 mg  81 mg Oral Daily Katha Hamming, MD   81 mg at 07/26/17 0911  . glipiZIDE (GLUCOTROL) tablet 10 mg  10 mg Oral BID AC Pyreddy, Vivien Rota, MD   10 mg at 07/26/17 0912  . HYDROcodone-acetaminophen (NORCO/VICODIN) 5-325 MG per tablet 1-2 tablet  1-2 tablet Oral Q4H PRN Ihor Austin, MD   2 tablet at 07/25/17 2059  . insulin aspart (novoLOG) injection 0-20 Units  0-20 Units Subcutaneous TID WC Katha Hamming, MD   4 Units at 07/26/17 1756  . insulin aspart (novoLOG) injection 0-5 Units  0-5 Units Subcutaneous QHS Pyreddy, Pavan, MD      . insulin glargine (LANTUS) injection 20 Units  20 Units Subcutaneous QHS Katha Hamming, MD   Stopped at 07/26/17 2200  .  lisinopril (PRINIVIL,ZESTRIL) tablet 5 mg  5 mg Oral Daily Pyreddy, Vivien Rota, MD   5 mg at 07/26/17 0911  . metoprolol tartrate (LOPRESSOR) tablet 12.5 mg  12.5 mg Oral BID Katha Hamming, MD   12.5 mg at 07/26/17 2156  . ondansetron (ZOFRAN) tablet 4 mg  4 mg Oral Q6H PRN Ihor Austin, MD       Or  . ondansetron (ZOFRAN) injection 4 mg  4 mg Intravenous Q6H PRN Pyreddy, Pavan, MD      . piperacillin-tazobactam (ZOSYN) IVPB 3.375 g  3.375 g Intravenous Q8H Pyreddy, Pavan, MD 12.5 mL/hr at 07/27/17 0534 3.375 g at 07/27/17 0534  . senna-docusate (Senokot-S) tablet 1 tablet  1 tablet Oral QHS PRN Ihor Austin, MD      . simvastatin (ZOCOR) tablet 40 mg  40 mg Oral Daily Pyreddy, Pavan, MD   40 mg at 07/26/17 0911     Objective: Vital signs in last 24 hours: Temp:  [98.9 F (37.2 C)-102.8 F (39.3 C)] 98.9 F (37.2 C) (09/18 0320) Pulse Rate:  [69-125] 89 (09/18 0320) Resp:  [17-20] 18 (09/18 0320) BP: (96-164)/(56-101) 96/56 (09/18 0320) SpO2:  [95 %-100 %] 99 % (09/18 0320)  Intake/Output from previous day: 09/17 0701 - 09/18 0700 In: 2673.1 [P.O.:240; I.V.:2233.1; IV Piggyback:200]  Out: 1375 [Urine:1375] Intake/Output this shift: No intake/output data recorded.   Physical Exam Constitutional: Well nourished. Alert and oriented, No acute distress. HEENT: [place] AT, moist mucus membranes. Trachea midline, no masses. Cardiovascular: No clubbing, cyanosis, or edema. Respiratory: Normal respiratory effort, no increased work of breathing. GI: Abdomen is soft, non tender, non distended, no abdominal masses. Liver and spleen not palpable.  No hernias appreciated.  Stool sample for occult testing is not indicated.   GU: No CVA tenderness.  No bladder fullness or masses.  Foley in place.  Draining clear pink urine.  Skin: No rashes, bruises or suspicious lesions. Lymph: No cervical or inguinal adenopathy. Neurologic: Grossly intact, no focal deficits, moving all 4  extremities. Psychiatric: Normal mood and affect.  Lab Results:   Recent Labs  07/25/17 0308  WBC 15.4*  HGB 14.5  HCT 42.4  PLT 153   BMET  Recent Labs  07/24/17 1012 07/24/17 1223 07/25/17 0308  NA 125*  --  129*  K 3.6 3.9 3.1*  CL 92*  --  94*  CO2 27  --  27  GLUCOSE 422*  --  93  BUN 12  --  12  CREATININE 0.89  --  0.61  CALCIUM 7.9*  --  7.9*   PT/INR  Recent Labs  07/24/17 1223  LABPROT 17.1*  INR 1.41   ABG No results for input(s): PHART, HCO3 in the last 72 hours.  Invalid input(s): PCO2, PO2  Studies/Results: Ct Abdomen Pelvis W Wo Contrast  Result Date: 07/26/2017 CLINICAL DATA:  Hematuria, weight loss, abdominal pain. Urinary tract infection. EXAM: CT ABDOMEN AND PELVIS WITHOUT AND WITH CONTRAST TECHNIQUE: Multidetector CT imaging of the abdomen and pelvis was performed following the standard protocol before and following the bolus administration of intravenous contrast. CONTRAST:  ISOVUE-300 IOPAMIDOL (ISOVUE-300) INJECTION 61% COMPARISON:  None. FINDINGS: Lower chest: 8 mm nodule seen in the right middle lobe. Image quality is degraded by respiratory motion. Small left pleural effusion collapse/ consolidation in the left lower lobe. Heart size normal. No pericardial effusion. Distal esophagus is grossly unremarkable. Hepatobiliary: Liver margin is markedly irregular. Stones are seen in the gallbladder. No biliary ductal dilatation. Pancreas: Negative. Spleen: Negative. Adrenals/Urinary Tract: Adrenal glands are unremarkable. Stones are seen in the kidneys bilaterally. There is a striated appearance in both kidneys with areas of ill-defined low attenuation on portal venous phase imaging. 1.3 cm low-attenuation lesion is seen in the interpolar left kidney (series 8, image 31), too small to characterize. On delayed imaging there is low attenuation material in the nondependent portion of the bladder. Stomach/Bowel: Stomach, small bowel, appendix and  colon are grossly unremarkable. Vascular/Lymphatic: Atherosclerotic calcification of the arterial vasculature without abdominal aortic aneurysm. Borderline enlarged porta hepatis lymph nodes, likely reactive. Reproductive: Prostate is enlarged and contains a large area of low-attenuation, measuring approximately 3.9 x 4.1 cm. Other: Moderate ascites.  Mild presacral edema. Musculoskeletal: Diffuse body wall edema. There are multiple fluid collections within the chest wall musculature, some of which are best seen on nephrographic phase imaging and some of which have peripheral high attenuation on portal venous phase imaging. Index collection in the left lateral chest wall measures 3.3 cm (incompletely imaged, series 5, image 1). A 1.3 cm low-attenuation collection with peripheral high attenuation is seen in the high right thigh musculature (series 5, image 76). No worrisome lytic or sclerotic lesions. IMPRESSION: 1. Organized fluid collections within the prostate and soft tissue musculature, highly worrisome for abscesses. 2. Striated appearance  of both kidneys, most indicative of pyelonephritis. Difficult to exclude developing abscesses. 3. Debris in the bladder.  Difficult to exclude a mass. 4. Right middle lobe nodule. Metastatic disease is not excluded. Continued attention on follow-up is recommended. 5. Cirrhosis with moderate ascites. 6. Small left pleural effusion with collapse/consolidation in the left lower lobe. 7. Bilateral renal stones. 8. Cholelithiasis. 9.  Aortic atherosclerosis (ICD10-170.0). Electronically Signed   By: Leanna Battles M.D.   On: 07/26/2017 08:03   US Pelvis Limited (transabdominal Only)  Result Date: 07/25/2017 CLINICAL DATA:  Urinary retention and possible benign prostatic hypertrophy. EXAM: LIMITED ULTRASOUND OF PELVIS TECHNIQUE: Limited transabdominal ultrasound examination of the pelvis was performed. COMPARISON:  None. FINDINGS: Posterior urinary bladder wall mass greater  than expected for the inter your icteric ridge shown for example on image 13, there is a 5.9 by 1.0 by 5.1 cm in thickness. Transitional cell carcinoma is a distinct possibility. The the prostate is moderately enlarged, volume calculated at 69 cubic cm. Calcifications in the prostate parenchyma are observed. Prevoid bladder volume was 146 cubic cm. Postvoid volume was 38 cubic cm. IMPRESSION: 1. Possible posterior bladder wall mass, for example image 13, transitional cell carcinoma is not excluded and cystoscopy is recommended. 2. Moderate enlargement the prostate gland at 69 cubic cm. Electronically Signed   By: Gaylyn Rong M.D.   On: 07/25/2017 12:19     Assessment and Plan 1. Prostate abscess  - s/p TURP and uproofing of abscess  - urine culture is positive for Staphylococcus aureus - blood cultures still pending  - Foley in place - recommend Foley to stay in place for 2 weeks  2. BPH  - s/p TURP  - prostate chips sent for pathology  3. Pyelonephritis  - continue broad spectrum antibiotic coverage - ID to decide on placement of PICC line  4. Bilateral stones  - No intervention warranted at this time will manage as an outpatient   LOS: 3 days    [facility] 07/27/2017

## 2017-07-27 NOTE — Care Management (Signed)
Elevated temperatures I suspect from the prostate abcess. Tyelenol given. Dr. Annabell Howells contacted for antibiotics. This will be givenon the floor under the care of the hospitalist.

## 2017-07-27 NOTE — Progress Notes (Signed)
Physical Therapy Treatment Patient Details Name: Cameron Coffey MRN: [id number] DOB: [birth year] Today's Date: 07/27/2017    History of Present Illness Pt is a 56 y/o M who presented with fever and chills and a burning sensation when urinating.  Pt was found to have a UTI and an elevated lactic acid level.  Blood sugar was also elevated and pt was dehydrated.  Code sepsis was called.  Pt with prostate abscess, pyelonephritis and pt is going for cystoscopy by urology and possible draining of abscess. noted that pt has R middle lobe nodule with possible metastasis, pending CT of the chest following urology procedure.      PT Comments    Pt agreeable to PT for bed exercises only. Denies pain; pt fatigued and lethargic. Pt participates in supine exercises with assist and rest breaks as needed. Pt becomes increasingly fatigued/lethargic throughout session. Continue PT to progress endurance, strength and participation to improve all functional mobility.    Follow Up Recommendations  SNF     Equipment Recommendations  Rolling walker with 5" wheels    Recommendations for Other Services       Precautions / Restrictions Precautions Precautions: Fall Restrictions Weight Bearing Restrictions: No    Mobility  Bed Mobility               General bed mobility comments: Not tested; pt does not wish out of bed, and is quite lethargic currently. Also notes urethral catheter in place and although explanation provided that it will not hinder therapy/movementm, pt still declines out of bed.  Transfers                    Ambulation/Gait                 Stairs            Wheelchair Mobility    Modified Rankin (Stroke Patients Only)       Balance                                            Cognition Arousal/Alertness: Lethargic Behavior During Therapy: WFL for tasks assessed/performed Overall Cognitive Status: Within Functional Limits for tasks  assessed                                        Exercises General Exercises - Lower Extremity Ankle Circles/Pumps: AROM;Both;20 reps;Supine Quad Sets: Strengthening;Both;20 reps;Supine Gluteal Sets: Strengthening;Both;20 reps;Supine Short Arc Quad: AROM;Both;20 reps;Supine Heel Slides: AROM;AAROM;Both;Supine;10 reps (2 sets) Hip ABduction/ADduction: AAROM;Both;Supine;10 reps (2 sets) Straight Leg Raises: AAROM;Both;10 reps;Supine    General Comments        Pertinent Vitals/Pain Pain Assessment: No/denies pain    Home Living                      Prior Function            PT Goals (current goals can now be found in the care plan section) Progress towards PT goals: Not progressing toward goals - comment    Frequency    Min 2X/week      PT Plan Current plan remains appropriate    Co-evaluation              AM-PAC PT "6 Clicks" Daily Activity  Outcome Measure  Difficulty turning over in bed (including adjusting bedclothes, sheets and blankets)?: Unable Difficulty moving from lying on back to sitting on the side of the bed? : Unable Difficulty sitting down on and standing up from a chair with arms (e.g., wheelchair, bedside commode, etc,.)?: Unable Help needed moving to and from a bed to chair (including a wheelchair)?: A Little Help needed walking in hospital room?: A Lot Help needed climbing 3-5 steps with a railing? : A Lot 6 Click Score: 10    End of Session   Activity Tolerance: Patient limited by fatigue;Patient limited by lethargy Patient left: in bed;with call bell/phone within reach;with bed alarm set   PT Visit Diagnosis: Muscle weakness (generalized) (M62.81);Unsteadiness on feet (R26.81);Difficulty in walking, not elsewhere classified (R26.2)     Time: 1610-9604 PT Time Calculation (min) (ACUTE ONLY): 28 min  Charges:  $Therapeutic Exercise: 23-37 mins                    G Codes:  Functional Assessment Tool Used:  AM-PAC 6 Clicks Basic Mobility;Clinical judgement    Scot Dock, PTA 07/27/2017, 3:05 PM

## 2017-07-28 LAB — BASIC METABOLIC PANEL
ANION GAP: 6 (ref 5–15)
BUN: 8 mg/dL (ref 6–20)
CHLORIDE: 98 mmol/L — AB (ref 101–111)
CO2: 26 mmol/L (ref 22–32)
CREATININE: 0.7 mg/dL (ref 0.61–1.24)
Calcium: 7.3 mg/dL — ABNORMAL LOW (ref 8.9–10.3)
GFR calc non Af Amer: >60 mL/min (ref >60)
GLUCOSE: 231 mg/dL — AB (ref 65–99)
Potassium: 3.4 mmol/L — ABNORMAL LOW (ref 3.5–5.1)
Sodium: 130 mmol/L — ABNORMAL LOW (ref 135–145)

## 2017-07-28 LAB — GLUCOSE, CAPILLARY
GLUCOSE-CAPILLARY: 163 mg/dL — AB (ref 65–99)
Glucose-Capillary: 115 mg/dL — ABNORMAL HIGH (ref 65–99)
Glucose-Capillary: 186 mg/dL — ABNORMAL HIGH (ref 65–99)
Glucose-Capillary: 250 mg/dL — ABNORMAL HIGH (ref 65–99)

## 2017-07-28 LAB — SURGICAL PATHOLOGY

## 2017-07-28 MED ORDER — DOCUSATE SODIUM 100 MG PO CAPS
100.0000 mg | ORAL_CAPSULE | Freq: Two times a day (BID) | ORAL | Status: DC
  Administered 2017-07-28 – 2017-07-29 (×3): 100 mg via ORAL
  Filled 2017-07-28 (×3): qty 1

## 2017-07-28 MED ORDER — CEFAZOLIN SODIUM-DEXTROSE 2-4 GM/100ML-% IV SOLN
2.0000 g | Freq: Three times a day (TID) | INTRAVENOUS | Status: DC
  Administered 2017-07-28 – 2017-07-29 (×4): 2 g via INTRAVENOUS
  Filled 2017-07-28 (×7): qty 100

## 2017-07-28 MED ORDER — SODIUM CHLORIDE 0.9% FLUSH: 10.0000 mL | INTRAVENOUS | Status: DC | PRN

## 2017-07-28 MED ORDER — LACTULOSE 10 GM/15ML PO SOLN
30.0000 g | Freq: Once | ORAL | Status: AC
  Administered 2017-07-28: 30 g via ORAL
  Filled 2017-07-28: qty 60

## 2017-07-28 MED ORDER — SODIUM CHLORIDE 0.9% FLUSH
10.0000 mL | Freq: Two times a day (BID) | INTRAVENOUS | Status: DC
  Administered 2017-07-29: 10 mL

## 2017-07-28 NOTE — Progress Notes (Signed)
Infectious Disease Long Term IV Antibiotic Orders 07/28/17  1961-04-14  DOB   Diagnosis: MSSA bacteremia, prostate abscess, PNA, subq Abscess  Culture results Specimen Description BLOOD RIGHT HAND   Special Requests BOTTLES DRAWN AEROBIC AND ANAEROBIC Blood Culture adequate volume   Culture Setup Time GRAM POSITIVE COCCI  IN BOTH AEROBIC AND ANAEROBIC BOTTLES  CRITICAL RESULT CALLED TO, READ BACK BY AND VERIFIED WITH: SHEEMA HALLAJI AT 1938 07/24/17.PMH      Culture STAPHYLOCOCCUS AUREUS    Report Status 07/27/2017 FINAL   Organism ID, Bacteria STAPHYLOCOCCUS AUREUS   Resulting Agency [facility]  Susceptibility    Staphylococcus aureus    MIC    CIPROFLOXACIN <=0.5 SENSITIVE "><=0.5 SENSI... Sensitive    CLINDAMYCIN <=0.25 SENSITIVE "><=0.25 SENS... Sensitive    ERYTHROMYCIN <=0.25 SENSITIVE "><=0.25 SENS... Sensitive    GENTAMICIN <=0.5 SENSITIVE "><=0.5 SENSI... Sensitive    Inducible Clindamycin NEGATIVE  Sensitive    OXACILLIN 0.5 SENSITIVE  Sensitive    RIFAMPIN <=0.5 SENSITIVE "><=0.5 SENSI... Sensitive    TETRACYCLINE <=1 SENSITIVE "><=1 SENSITIVE  Sensitive    TRIMETH/SULFA <=10 SENSITIVE "><=10 SENSIT... Sensitive    VANCOMYCIN <=0.5 SENSITIVE "><=0.5 SENSI... Sensitive       Lab Results  Component Value Date   ESRSEDRATE 40 (H) 07/27/2017   Lab Results  Component Value Date   CRP 7.8 (H) 07/26/2017   Lab Results  Component Value Date   CREATININE 0.70 07/28/2017   BUN 8 07/28/2017   NA 130 (L) 07/28/2017   K 3.4 (L) 07/28/2017   CL 98 (L) 07/28/2017   CO2 26 07/28/2017   Lab Results  Component Value Date   WBC 8.1 07/27/2017   HGB 12.4 (L) 07/27/2017   HCT 36.3 (L) 07/27/2017   MCV 93.2 07/27/2017   PLT 136 (L) 07/27/2017    Allergies: No Known Allergies  Discharge antibiotics Cefazolin      2 grams every  8 hours  PICC Care per protocol Labs weekly while on IV antibiotics      CBC w diff   Comprehensive met panel CRP  ESR  Planned  duration of antibiotics 4-6 weeks from 9/17 neg [place]  Stop date Oct 29  Follow up clinic date 3-4 weeks  FAX weekly labs to [phone]  Leonel Ramsay, MD

## 2017-07-28 NOTE — Progress Notes (Signed)
[facility] [place] [facility]   PATIENT NAME: Cameron Coffey    MR#:  [id number]  DATE OF BIRTH:  03/24/1961  SUBJECTIVE:  Patient has MSSA bacteremia.drainage  Of  Prostate abscess , now has a Foley. Afebrile now.   CHIEF COMPLAINT:   Chief Complaint  Patient presents with  . Fever  . Hyperglycemia    REVIEW OF SYSTEMS:    Review of Systems  Constitutional: Negative for chills and fever.  HENT: Negative for hearing loss.   Eyes: Negative for blurred vision, double vision and photophobia.  Respiratory: Negative for cough, hemoptysis and shortness of breath.   Cardiovascular: Negative for palpitations, orthopnea and leg swelling.  Gastrointestinal: Negative for abdominal pain, diarrhea and vomiting.  Genitourinary: Negative for dysuria and urgency.  Musculoskeletal: Positive for back pain and myalgias. Negative for neck pain.  Skin: Negative for rash.  Neurological: Positive for weakness. Negative for dizziness, focal weakness, seizures and headaches.  Psychiatric/Behavioral: Negative for memory loss. The patient does not have insomnia.     Nutrition: Tolerating Diet: Tolerating PT:      DRUG ALLERGIES:  No Known Allergies  VITALS:  Blood pressure 120/81, pulse 80, temperature 98.1 F (36.7 C), temperature source Oral, resp. rate 18, height 5\' 8"  (1.727 m), weight 64.4 kg (142 lb), SpO2 98 %.  PHYSICAL EXAMINATION:   Physical Exam  GENERAL:  56 y.o.-year-old patient lying in the bed with no acute distress. Appears ill. EYES: Pupils equal, round, reactive to light   No scleral icterus. Extraocular muscles intact.  HEENT: Head atraumatic, normocephalic. Oropharynx and nasopharynx clear.  NECK:  Supple, no jugular venous distention. No thyroid enlargement, no tenderness.  Bump on Right upper back,not erythematous.non tender,. LUNGS: Normal breath sounds bilaterally, no wheezing, rales,rhonchi or crepitation. No use of accessory muscles of  respiration.  CARDIOVASCULAR: S1, S2 normal. No murmurs, rubs, or gallops.  ABDOMEN: Soft, nontender, nondistended. Bowel sounds present. No organomegaly or mass. Foley present, and  Draining blood tinged urine. EXTREMITIES: No pedal edema, cyanosis, or clubbing.  NEUROLOGIC: Cranial nerves II through XII are intact. Muscle strength 5/5 in all extremities. Sensation intact. Gait not checked. Patient complains of upper back pain, noted to have small left lipoma on the right scapular region. PSYCHIATRIC: The patient is alert and oriented x 3.  SKIN: No obvious rash, lesion, or ulcer.    LABORATORY PANEL:   CBC  Recent Labs Lab 07/27/17 1300  WBC 8.1  HGB 12.4*  HCT 36.3*  PLT 136*   ------------------------------------------------------------------------------------------------------------------  Chemistries   Recent Labs Lab 07/25/17 0308 07/27/17 1444  NA 129*  --   K 3.1*  --   CL 94*  --   CO2 27  --   GLUCOSE 93  --   BUN 12 11  CREATININE 0.61 0.63  CALCIUM 7.9*  --    ------------------------------------------------------------------------------------------------------------------  Cardiac Enzymes  Recent Labs Lab 07/24/17 1223  TROPONINI 0.04*   ------------------------------------------------------------------------------------------------------------------  RADIOLOGY:  Ct Chest W Contrast  Result Date: 07/27/2017 CLINICAL DATA:  History of pneumonia, followup, former smoking history EXAM: CT CHEST WITH CONTRAST TECHNIQUE: Multidetector CT imaging of the chest was performed during intravenous contrast administration. CONTRAST:  [id number] IOPAMIDOL (ISOVUE-300) INJECTION 61% COMPARISON:  Chest x-ray of 07/24/2017 FINDINGS: Cardiovascular: Variation of an aberrant right subclavian artery is noted coursing posterior to the esophagus. The thoracic aorta is well opacified with no acute abnormality noted. Mild thoracic aortic atherosclerotic change present.  The pulmonary arteries are  not as well opacified but no abnormality is seen. There are coronary artery calcifications primarily in the distribution of the left anterior descending. The heart is within upper limits of normal. No pericardial effusion is seen. The mid ascending thoracic aorta measures 31 mm in diameter. Mediastinum/Nodes: No mediastinal or hilar adenopathy is seen the thyroid gland is normal in size. Lungs/Pleura: On lung window images, there is a moderate size left pleural effusion present. There may be a small amount air within this collection of fluid posterior, and followup is recommended. Opacity posteriorly in the left lower lobe is most consistent with atelectasis or pneumonia. A few air bronchograms are noted through this area of dense partial consolidation. Mild atelectasis is noted posteriorly at the right lung base. There is a pleural-based nodular opacity anteriorly in the right middle lobe which on axial image 85 series 3 measures 8 mm, being slightly more prominent on sagittal images. A developing lung neoplasm cannot be excluded and continued followup is recommended in 4-6 months by CT the chest. Upper Abdomen: There does appear to be ascites within the upper abdomen. There is protrusion of the anterior margin of the left lobe of liver as on image 130 series 2 and a hepatic mass cannot be excluded. Abdominal ultrasound may be helpful to assess, but MR of the abdomen would be preferable to evaluate this area further. Musculoskeletal: The thoracic vertebrae are in normal alignment with no compression deformity noted. IMPRESSION: 1. Moderate size left pleural effusion. Small amount air may be within this fluid collection and continued followup is recommended. 2. Opacity posteriorly in the left lower lobe consistent with atelectasis or pneumonia. 3. 8 mm pleural-based opacity in the anterior right middle lobe. Cannot exclude neoplasm. Recommend CT of the chest is followup in 4-6 months. 4.  Possible hepatic lesion anteriorly in the left lobe. Consider ultrasound or preferably MRI to assess further. Ascites in the upper abdomen. 5. Aberrant right subclavian artery. Electronically Signed   By: Dwyane Dee M.D.   On: 07/27/2017 16:10     ASSESSMENT AND PLAN:   Active Problems:   Sepsis (HCC)   1 .staph aureus bacteremia, urine culture showed staph aureus. First blood culture showed MSSA.Repeat blood cultures No growth for 3 days. seen by Dr. Sampson Goon. If repeat blood cultures showing no infection for 72 hours then he needs PICC lne. Consult IV team. For prolonged duration of iv abx, Patient has moderate left pleural effusion, left lower lobe pneumonia, pleural-based opacity in the right middle lobe unable to exclude neoplasm, follow-up CT chest in 4-6 month,ID is  following regarding further infection workup. #2. hyponatremia due to dehydration: Improving with IV fluids  3.Hyperkalemia;Status post Kayexalate. Hyperkalemia resolved.  #4. diabetes mellitus type 2 poorly controlled, hemoglobin A1c is 14. Seen by diabetes coordinator. Patient cannot afford Lantus due to cost. So we will give 70/30 10 units twice a day when discharged. For now continue Lantus, glipizide, sliding scale insulin coverage.  #5. Elevated troponin secondary to demand ischemia from sepsis. Seen by cardiology. Echocardiogram is pending. Troponin levels are coming down. H  echocardiogram." Shows no wall motion abnormality discontinue heparin.  #6 /prostate abscess, pyelonephritis: Status post drainage of prostate abscess yesterday by urology, now has a Foley. Continue Foley for 2 weeks as per urologyrecommendation, follow prostate chips for biopsy.  Overall prognosis . Weight loss, pyelonephritis, poorly controlled diabetes mellitus, bacteremia  #8. Deconditioning; patient told me he is homeless:  Physical therapy recommends SNF.  All records are reviewed  and case discussed with Care Management/Social  Workerr. Management plans discussed with the patient, family and they are in agreement.  CODE STATUS: full  TOTAL TIME TAKING CARE OF THIS PATIENT: 35 minutes.   POSSIBLE D/C IN 1-2DAYS, DEPENDING ON CLINICAL CONDITION.   Katha Hamming M.D on 07/28/2017 at 11:41 AM  Between 7am to 6pm - Pager - [phone]  After 6pm go to [email or web address] - password [email or web address] [facility]  [facility]  Office  [phone]  CC: Primary care physician; Lorre Munroe, NP

## 2017-07-28 NOTE — Progress Notes (Signed)
Urology Consult Follow Up  Subjective: POD # 2 s/p TURP with uproofing of prostate abscess.  Patient without complaints this a.m.  No fevers last night.  WBC count is 8.1.  Urine is a clear.  UOP is good.    Anti-infectives: Anti-infectives    Start     Dose/Rate Route Frequency Ordered Stop   07/24/17 1330  piperacillin-tazobactam (ZOSYN) IVPB 3.375 g     3.375 g 12.5 mL/hr over 240 Minutes Intravenous Every 8 hours 07/24/17 0640     07/24/17 0530  piperacillin-tazobactam (ZOSYN) IVPB 3.375 g     3.375 g 100 mL/hr over 30 Minutes Intravenous  Once 07/24/17 0526 07/24/17 0542   07/24/17 0530  vancomycin (VANCOCIN) IVPB 1000 mg/200 mL premix     1,000 mg 200 mL/hr over 60 Minutes Intravenous  Once 07/24/17 0526 07/24/17 0648      Current Facility-Administered Medications  Medication Dose Route Frequency Provider Last Rate Last Dose  . 0.9 %  sodium chloride infusion   Intravenous Continuous Katha Hamming, MD 75 mL/hr at 07/28/17 0729    . acetaminophen (TYLENOL) tablet 650 mg  650 mg Oral Q6H PRN Ihor Austin, MD   650 mg at 07/27/17 0037   Or  . acetaminophen (TYLENOL) suppository 650 mg  650 mg Rectal Q6H PRN Pyreddy, Vivien Rota, MD      . aspirin chewable tablet 81 mg  81 mg Oral Daily Katha Hamming, MD   81 mg at 07/27/17 0816  . enoxaparin (LOVENOX) injection 40 mg  40 mg Subcutaneous Q24H Katha Hamming, MD   40 mg at 07/27/17 1910  . glipiZIDE (GLUCOTROL) tablet 10 mg  10 mg Oral BID AC Pyreddy, Vivien Rota, MD   10 mg at 07/27/17 1612  . HYDROcodone-acetaminophen (NORCO/VICODIN) 5-325 MG per tablet 1-2 tablet  1-2 tablet Oral Q4H PRN Ihor Austin, MD   2 tablet at 07/28/17 0503  . insulin aspart (novoLOG) injection 0-20 Units  0-20 Units Subcutaneous TID WC Katha Hamming, MD   4 Units at 07/27/17 1811  . insulin aspart (novoLOG) injection 0-5 Units  0-5 Units Subcutaneous QHS Ihor Austin, MD   3 Units at 07/27/17 2139  . insulin glargine (LANTUS)  injection 13 Units  13 Units Subcutaneous QHS Katha Hamming, MD   13 Units at 07/27/17 2139  . lisinopril (PRINIVIL,ZESTRIL) tablet 5 mg  5 mg Oral Daily Pyreddy, Vivien Rota, MD   5 mg at 07/26/17 0911  . metoprolol tartrate (LOPRESSOR) tablet 12.5 mg  12.5 mg Oral BID Katha Hamming, MD   12.5 mg at 07/27/17 2140  . ondansetron (ZOFRAN) tablet 4 mg  4 mg Oral Q6H PRN Ihor Austin, MD       Or  . ondansetron (ZOFRAN) injection 4 mg  4 mg Intravenous Q6H PRN Pyreddy, Pavan, MD      . piperacillin-tazobactam (ZOSYN) IVPB 3.375 g  3.375 g Intravenous Q8H Pyreddy, Pavan, MD 12.5 mL/hr at 07/28/17 0503 3.375 g at 07/28/17 0503  . senna-docusate (Senokot-S) tablet 1 tablet  1 tablet Oral QHS PRN Ihor Austin, MD   1 tablet at 07/27/17 1908  . simvastatin (ZOCOR) tablet 40 mg  40 mg Oral Daily Pyreddy, Pavan, MD   40 mg at 07/27/17 0816     Objective: Vital signs in last 24 hours: Temp:  [97.6 F (36.4 C)-98.8 F (37.1 C)] 98.8 F (37.1 C) (09/19 0426) Pulse Rate:  [82-89] 82 (09/19 0426) Resp:  [17-18] 17 (09/19 0426) BP: (99-113)/(62-77) 113/77 (09/19 0426) SpO2:  [  96 %-100 %] 100 % (09/19 0426)  Intake/Output from previous day: 09/18 0701 - 09/19 0700 In: 2575 [P.O.:600; I.V.:1825; IV Piggyback:150] Out: 1500 [Urine:1500] Intake/Output this shift: Total I/O In: 428.8 [I.V.:428.8] Out: -    Physical Exam Constitutional: Well nourished. Alert and oriented, No acute distress. HEENT: [place] AT, moist mucus membranes. Trachea midline, no masses. Cardiovascular: No clubbing, cyanosis, or edema. Respiratory: Normal respiratory effort, no increased work of breathing. GI: Abdomen is soft, non tender, non distended, no abdominal masses. Liver and spleen not palpable.  No hernias appreciated.  Stool sample for occult testing is not indicated.   GU: No CVA tenderness.  No bladder fullness or masses.  Foley in place.  Draining clear urine.    Skin: No rashes, bruises or suspicious  lesions. Lymph: No cervical or inguinal adenopathy. Neurologic: Grossly intact, no focal deficits, moving all 4 extremities. Psychiatric: Normal mood and affect.  Lab Results:   Recent Labs  07/27/17 1300  WBC 8.1  HGB 12.4*  HCT 36.3*  PLT 136*   BMET  Recent Labs  07/27/17 1444  BUN 11  CREATININE 0.63   PT/INR No results for input(s): LABPROT, INR in the last 72 hours. ABG No results for input(s): PHART, HCO3 in the last 72 hours.  Invalid input(s): PCO2, PO2  Studies/Results: Ct Chest W Contrast  Result Date: 07/27/2017 CLINICAL DATA:  History of pneumonia, followup, former smoking history EXAM: CT CHEST WITH CONTRAST TECHNIQUE: Multidetector CT imaging of the chest was performed during intravenous contrast administration. CONTRAST:  75mL ISOVUE-300 IOPAMIDOL (ISOVUE-300) INJECTION 61% COMPARISON:  Chest x-ray of 07/24/2017 FINDINGS: Cardiovascular: Variation of an aberrant right subclavian artery is noted coursing posterior to the esophagus. The thoracic aorta is well opacified with no acute abnormality noted. Mild thoracic aortic atherosclerotic change present. The pulmonary arteries are not as well opacified but no abnormality is seen. There are coronary artery calcifications primarily in the distribution of the left anterior descending. The heart is within upper limits of normal. No pericardial effusion is seen. The mid ascending thoracic aorta measures 31 mm in diameter. Mediastinum/Nodes: No mediastinal or hilar adenopathy is seen the thyroid gland is normal in size. Lungs/Pleura: On lung window images, there is a moderate size left pleural effusion present. There may be a small amount air within this collection of fluid posterior, and followup is recommended. Opacity posteriorly in the left lower lobe is most consistent with atelectasis or pneumonia. A few air bronchograms are noted through this area of dense partial consolidation. Mild atelectasis is noted posteriorly at  the right lung base. There is a pleural-based nodular opacity anteriorly in the right middle lobe which on axial image 85 series 3 measures 8 mm, being slightly more prominent on sagittal images. A developing lung neoplasm cannot be excluded and continued followup is recommended in 4-6 months by CT the chest. Upper Abdomen: There does appear to be ascites within the upper abdomen. There is protrusion of the anterior margin of the left lobe of liver as on image 130 series 2 and a hepatic mass cannot be excluded. Abdominal ultrasound may be helpful to assess, but MR of the abdomen would be preferable to evaluate this area further. Musculoskeletal: The thoracic vertebrae are in normal alignment with no compression deformity noted. IMPRESSION: 1. Moderate size left pleural effusion. Small amount air may be within this fluid collection and continued followup is recommended. 2. Opacity posteriorly in the left lower lobe consistent with atelectasis or pneumonia. 3. 8 mm pleural-based  opacity in the anterior right middle lobe. Cannot exclude neoplasm. Recommend CT of the chest is followup in 4-6 months. 4. Possible hepatic lesion anteriorly in the left lobe. Consider ultrasound or preferably MRI to assess further. Ascites in the upper abdomen. 5. Aberrant right subclavian artery. Electronically Signed   By: Dwyane Dee M.D.   On: 07/27/2017 16:10     Assessment and Plan 1. Prostate abscess  - s/p TURP and uproofing of abscess  - urine culture is positive for Staphylococcus aureus - blood cultures still pending, no growth so far  - Foley in place - recommend Foley to stay in place for 2 weeks  2. BPH  - s/p TURP  - prostate chips sent for pathology - results pending  3. Pyelonephritis  - continue broad spectrum antibiotic coverage - ID to decide on placement of PICC line  4. Bilateral stones  - No intervention warranted at this time will manage as an outpatient   LOS: 4 days    [facility]  [facility] 07/28/2017

## 2017-07-28 NOTE — Progress Notes (Signed)
[facility] CLINIC INFECTIOUS DISEASE PROGRESS NOTE Date of Admission:  07/24/2017     ID: CABE LASHLEY is a 56 y.o. male with  MRSA bacteremia  Active Problems:   Sepsis (HCC)   Subjective: No further fevers, still with pain. Foley in palce  ROS  Eleven systems are reviewed and negative except per hpi  Medications:  Antibiotics Given (last 72 hours)    Date/Time Action Medication Dose Rate   07/25/17 1304 New Bag/Given   piperacillin-tazobactam (ZOSYN) IVPB 3.375 g 3.375 g 12.5 mL/hr   07/25/17 2301 New Bag/Given   piperacillin-tazobactam (ZOSYN) IVPB 3.375 g 3.375 g 12.5 mL/hr   07/26/17 0548 New Bag/Given   piperacillin-tazobactam (ZOSYN) IVPB 3.375 g 3.375 g 12.5 mL/hr   07/26/17 1256 New Bag/Given   piperacillin-tazobactam (ZOSYN) IVPB 3.375 g 3.375 g 12.5 mL/hr   07/26/17 2100 New Bag/Given   piperacillin-tazobactam (ZOSYN) IVPB 3.375 g 3.375 g 12.5 mL/hr   07/27/17 0534 New Bag/Given   piperacillin-tazobactam (ZOSYN) IVPB 3.375 g 3.375 g 12.5 mL/hr   07/27/17 1330 New Bag/Given   piperacillin-tazobactam (ZOSYN) IVPB 3.375 g 3.375 g 12.5 mL/hr   07/27/17 2140 New Bag/Given   piperacillin-tazobactam (ZOSYN) IVPB 3.375 g 3.375 g 12.5 mL/hr   07/28/17 0503 New Bag/Given   piperacillin-tazobactam (ZOSYN) IVPB 3.375 g 3.375 g 12.5 mL/hr     . aspirin  81 mg Oral Daily  . enoxaparin (LOVENOX) injection  40 mg Subcutaneous Q24H  . glipiZIDE  10 mg Oral BID AC  . insulin aspart  0-20 Units Subcutaneous TID WC  . insulin aspart  0-5 Units Subcutaneous QHS  . insulin glargine  13 Units Subcutaneous QHS  . lisinopril  5 mg Oral Daily  . metoprolol tartrate  12.5 mg Oral BID  . simvastatin  40 mg Oral Daily    Objective: Vital signs in last 24 hours: Temp:  [97.6 F (36.4 C)-98.8 F (37.1 C)] 98.1 F (36.7 C) (09/19 0827) Pulse Rate:  [80-89] 80 (09/19 0827) Resp:  [17-18] 18 (09/19 0827) BP: (103-120)/(68-81) 120/81 (09/19 0827) SpO2:  [96 %-100 %] 98 % (09/19  0827) Constitutional: He is oriented to person, place, and time. Ill appearing HENT: anicteric  Mouth/Throat: Oropharynx is clear and dry . No oropharyngeal exudate.  Cardiovascular: Normal rate, regular rhythm and normal heart sounds. Pulmonary/Chest: Effort normal and breath sounds normal. No respiratory distress. He has no wheezes.  Abdominal: Soft. Bowel sounds are normal. He exhibits no distension. There is no tenderness. Lymphadenopathy: He has no cervical adenopathy.  Neurological: He is alert and oriented to person, place, and time.  Skin: L upper back has an area of induration and mild ttp. No drainage.  Psychiatric: He has a normal mood and affect. His behavior is normal.   Lab Results  Recent Labs  07/27/17 1300 07/27/17 1444  WBC 8.1  --   HGB 12.4*  --   HCT 36.3*  --   BUN  --  11  CREATININE  --  0.63    Microbiology: Results for orders placed or performed during the hospital encounter of 07/24/17  Blood culture (routine x 2)     Status: Abnormal   Collection Time: 07/24/17  4:24 AM  Result Value Ref Range Status   Specimen Description BLOOD LEFT HAND  Final   Special Requests   Final    BOTTLES DRAWN AEROBIC AND ANAEROBIC Blood Culture adequate volume   Culture  Setup Time   Final    GRAM POSITIVE COCCI  IN BOTH AEROBIC AND ANAEROBIC BOTTLES CRITICAL RESULT CALLED TO, READ BACK BY AND VERIFIED WITH: [facility] HALLAJI AT 1938 07/24/17.PMH    Culture (A)  Final    STAPHYLOCOCCUS AUREUS SUSCEPTIBILITIES PERFORMED ON PREVIOUS CULTURE WITHIN THE LAST 5 DAYS. Performed at [facility], [address], [place], [place] [address]    Report Status 07/27/2017 FINAL  Final  Blood culture (routine x 2)     Status: Abnormal   Collection Time: 07/24/17  4:24 AM  Result Value Ref Range Status   Specimen Description BLOOD RIGHT HAND  Final   Special Requests   Final    BOTTLES DRAWN AEROBIC AND ANAEROBIC Blood Culture adequate volume   Culture  Setup Time   Final     GRAM POSITIVE COCCI IN BOTH AEROBIC AND ANAEROBIC BOTTLES CRITICAL RESULT CALLED TO, READ BACK BY AND VERIFIED WITH: [facility] HALLAJI AT 1938 07/24/17.PMH    Culture STAPHYLOCOCCUS AUREUS (A)  Final   Report Status 07/27/2017 FINAL  Final   Organism ID, Bacteria STAPHYLOCOCCUS AUREUS  Final      Susceptibility   Staphylococcus aureus - MIC*    CIPROFLOXACIN <=0.5 SENSITIVE Sensitive     ERYTHROMYCIN <=0.25 SENSITIVE Sensitive     GENTAMICIN <=0.5 SENSITIVE Sensitive     OXACILLIN 0.5 SENSITIVE Sensitive     TETRACYCLINE <=1 SENSITIVE Sensitive     VANCOMYCIN <=0.5 SENSITIVE Sensitive     TRIMETH/SULFA <=10 SENSITIVE Sensitive     CLINDAMYCIN <=0.25 SENSITIVE Sensitive     RIFAMPIN <=0.5 SENSITIVE Sensitive     Inducible Clindamycin NEGATIVE Sensitive     * STAPHYLOCOCCUS AUREUS  Blood Culture ID Panel (Reflexed)     Status: Abnormal   Collection Time: 07/24/17  4:24 AM  Result Value Ref Range Status   Enterococcus species NOT DETECTED NOT DETECTED Final   Listeria monocytogenes NOT DETECTED NOT DETECTED Final   Staphylococcus species DETECTED (A) NOT DETECTED Final    Comment: CRITICAL RESULT CALLED TO, READ BACK BY AND VERIFIED WITH: SHEEMA HALLAJI AT 1938 07/24/17.PMH    Staphylococcus aureus DETECTED (A) NOT DETECTED Final    Comment: Methicillin (oxacillin) susceptible Staphylococcus aureus (MSSA). Preferred therapy is anti staphylococcal beta lactam antibiotic (Cefazolin or Nafcillin), unless clinically contraindicated. CRITICAL RESULT CALLED TO, READ BACK BY AND VERIFIED WITH: [facility] HALLAJI AT 1938 07/24/17.PMH    Methicillin resistance NOT DETECTED NOT DETECTED Final   Streptococcus species NOT DETECTED NOT DETECTED Final   Streptococcus agalactiae NOT DETECTED NOT DETECTED Final   Streptococcus pneumoniae NOT DETECTED NOT DETECTED Final   Streptococcus pyogenes NOT DETECTED NOT DETECTED Final   Acinetobacter baumannii NOT DETECTED NOT DETECTED Final   Enterobacteriaceae  species NOT DETECTED NOT DETECTED Final   Enterobacter cloacae complex NOT DETECTED NOT DETECTED Final   Escherichia coli NOT DETECTED NOT DETECTED Final   Klebsiella oxytoca NOT DETECTED NOT DETECTED Final   Klebsiella pneumoniae NOT DETECTED NOT DETECTED Final   Proteus species NOT DETECTED NOT DETECTED Final   Serratia marcescens NOT DETECTED NOT DETECTED Final   Haemophilus influenzae NOT DETECTED NOT DETECTED Final   Neisseria meningitidis NOT DETECTED NOT DETECTED Final   Pseudomonas aeruginosa NOT DETECTED NOT DETECTED Final   Candida albicans NOT DETECTED NOT DETECTED Final   Candida glabrata NOT DETECTED NOT DETECTED Final   Candida krusei NOT DETECTED NOT DETECTED Final   Candida parapsilosis NOT DETECTED NOT DETECTED Final   Candida tropicalis NOT DETECTED NOT DETECTED Final  Urine Culture  Status: Abnormal   Collection Time: 07/25/17  3:34 AM  Result Value Ref Range Status   Specimen Description URINE, RANDOM  Final   Special Requests NONE  Final   Culture >=100,000 COLONIES/mL STAPHYLOCOCCUS AUREUS (A)  Final   Report Status 07/27/2017 FINAL  Final   Organism ID, Bacteria STAPHYLOCOCCUS AUREUS (A)  Final      Susceptibility   Staphylococcus aureus - MIC*    CIPROFLOXACIN <=0.5 SENSITIVE Sensitive     GENTAMICIN <=0.5 SENSITIVE Sensitive     NITROFURANTOIN <=16 SENSITIVE Sensitive     OXACILLIN <=0.25 SENSITIVE Sensitive     TETRACYCLINE <=1 SENSITIVE Sensitive     VANCOMYCIN 1 SENSITIVE Sensitive     TRIMETH/SULFA <=10 SENSITIVE Sensitive     CLINDAMYCIN <=0.25 SENSITIVE Sensitive     RIFAMPIN <=0.5 SENSITIVE Sensitive     Inducible Clindamycin NEGATIVE Sensitive     * >=100,000 COLONIES/mL STAPHYLOCOCCUS AUREUS  Culture, blood (Routine X 2) w Reflex to ID Panel     Status: None (Preliminary result)   Collection Time: 07/25/17  5:17 PM  Result Value Ref Range Status   Specimen Description BLOOD RIGHT ANTECUBITAL  Final   Special Requests   Final    BOTTLES  DRAWN AEROBIC AND ANAEROBIC Blood Culture adequate volume   Culture  Setup Time   Final    GRAM POSITIVE COCCI IN BOTH AEROBIC AND ANAEROBIC BOTTLES CRITICAL RESULT CALLED TO, READ BACK BY AND VERIFIED WITH: KAREN HAYES AT 1444 ON 07/26/2017 JJB    Culture GRAM POSITIVE COCCI  Final   Report Status PENDING  Incomplete  Culture, blood (Routine X 2) w Reflex to ID Panel     Status: None (Preliminary result)   Collection Time: 07/25/17  5:25 PM  Result Value Ref Range Status   Specimen Description BLOOD LEFT ANTECUBITAL  Final   Special Requests   Final    BOTTLES DRAWN AEROBIC AND ANAEROBIC Blood Culture results may not be optimal due to an excessive volume of blood received in culture bottles   Culture NO GROWTH 3 DAYS  Final   Report Status PENDING  Incomplete  Culture, blood (single) w Reflex to ID Panel     Status: None (Preliminary result)   Collection Time: 07/26/17  4:54 PM  Result Value Ref Range Status   Specimen Description BLOOD LEFT ANTECUBITAL  Final   Special Requests   Final    BOTTLES DRAWN AEROBIC AND ANAEROBIC Blood Culture adequate volume   Culture NO GROWTH 2 DAYS  Final   Report Status PENDING  Incomplete     Studies/Results: Ct Chest W Contrast  Result Date: 07/27/2017 CLINICAL DATA:  History of pneumonia, followup, former smoking history EXAM: CT CHEST WITH CONTRAST TECHNIQUE: Multidetector CT imaging of the chest was performed during intravenous contrast administration. CONTRAST:  75mL ISOVUE-300 IOPAMIDOL (ISOVUE-300) INJECTION 61% COMPARISON:  Chest x-ray of 07/24/2017 FINDINGS: Cardiovascular: Variation of an aberrant right subclavian artery is noted coursing posterior to the esophagus. The thoracic aorta is well opacified with no acute abnormality noted. Mild thoracic aortic atherosclerotic change present. The pulmonary arteries are not as well opacified but no abnormality is seen. There are coronary artery calcifications primarily in the distribution of the  left anterior descending. The heart is within upper limits of normal. No pericardial effusion is seen. The mid ascending thoracic aorta measures 31 mm in diameter. Mediastinum/Nodes: No mediastinal or hilar adenopathy is seen the thyroid gland is normal in size. Lungs/Pleura: On lung window images,  there is a moderate size left pleural effusion present. There may be a small amount air within this collection of fluid posterior, and followup is recommended. Opacity posteriorly in the left lower lobe is most consistent with atelectasis or pneumonia. A few air bronchograms are noted through this area of dense partial consolidation. Mild atelectasis is noted posteriorly at the right lung base. There is a pleural-based nodular opacity anteriorly in the right middle lobe which on axial image 85 series 3 measures 8 mm, being slightly more prominent on sagittal images. A developing lung neoplasm cannot be excluded and continued followup is recommended in 4-6 months by CT the chest. Upper Abdomen: There does appear to be ascites within the upper abdomen. There is protrusion of the anterior margin of the left lobe of liver as on image 130 series 2 and a hepatic mass cannot be excluded. Abdominal ultrasound may be helpful to assess, but MR of the abdomen would be preferable to evaluate this area further. Musculoskeletal: The thoracic vertebrae are in normal alignment with no compression deformity noted. IMPRESSION: 1. Moderate size left pleural effusion. Small amount air may be within this fluid collection and continued followup is recommended. 2. Opacity posteriorly in the left lower lobe consistent with atelectasis or pneumonia. 3. 8 mm pleural-based opacity in the anterior right middle lobe. Cannot exclude neoplasm. Recommend CT of the chest is followup in 4-6 months. 4. Possible hepatic lesion anteriorly in the left lobe. Consider ultrasound or preferably MRI to assess further. Ascites in the upper abdomen. 5. Aberrant  right subclavian artery. Electronically Signed   By: Dwyane Dee M.D.   On: 07/27/2017 16:10    Assessment/Plan: ZIAIRE HAGOS is a 56 y.o. male admitted with fevers and weakness as well as dysuria. On admit elevated lactic acid, dehydration, and wbc 17 and febirle to 100.2. Has had a general decline in last few weeks as well. BCX + MSSA.  CXR neg, Pelvic USS with possible bladder mass or inflammation. UA On admit with TNTC WBC. UCX Staph.  CT scan with likley prostate abscess, possible pyelo, multiple soft tissue abscesse as well as RML nodule and cirrhosis with mod ascites. Has renal stones as well.   L upper back with area of swelling, had recent L facial abscess he self drained High risk of endocarditis and seeding of multiple sites. However TTE negative and good images so can likely avoid TEE at this point.   S.p Turp 9.17.  Foley in place and urology rec to leave in place 2 weeks. CT chest showed L pleural effusion, consolidation.  I worry he may have had some septic pulm emboli as well.  Will need to monitor for need to drain sub Q abscesses.  Recommendations Continue  Ancef  Repeat bcx  9/16 was +., bcx 9/17 NGTD Can place picc Will need 4-6 weeks IV abx. See abx order sheet Thank you very much for the consult. Will follow with you.  Mick Sell   07/28/2017, 9:24 AM

## 2017-07-28 NOTE — Care Management (Signed)
Barrier- Hold PICC for negative blood cultures for 72 hours

## 2017-07-28 NOTE — Plan of Care (Signed)
Problem: Education: Goal: Knowledge of  General Education information/materials will improve Outcome: Completed/Met Date Met: 07/28/17 Picked this pt up at 2300. Pt receiving IV fluids & Iv abx. Foley in place, draining pink clear urine. Pt complained of pain once in buttocks, norco given and repositioned with pillows.

## 2017-07-28 NOTE — Progress Notes (Signed)
Peripherally Inserted Central Catheter/Midline Placement  The IV Nurse has discussed with the patient and/or persons authorized to consent for the patient, the purpose of this procedure and the potential benefits and risks involved with this procedure.  The benefits include less needle sticks, lab draws from the catheter, and the patient may be discharged home with the catheter. Risks include, but not limited to, infection, bleeding, blood clot (thrombus formation), and puncture of an artery; nerve damage and irregular heartbeat and possibility to perform a PICC exchange if needed/ordered by physician.  Alternatives to this procedure were also discussed.  Bard Power PICC patient education guide, fact sheet on infection prevention and patient information card has been provided to patient /or left at bedside.    PICC/Midline Placement Documentation  PICC Single Lumen 07/28/17 PICC Right Brachial 42 cm 2 cm (Active)       Romie Jumper 07/28/2017, 10:42 PM

## 2017-07-28 NOTE — Progress Notes (Signed)
Physical Therapy Treatment Patient Details Name: Cameron Coffey MRN: [id number] DOB: 04-14-1961 Today's Date: 07/28/2017    History of Present Illness Pt is a 56 y/o M who presented with fever and chills and a burning sensation when urinating.  Pt was found to have a UTI and an elevated lactic acid level.  Blood sugar was also elevated and pt was dehydrated.  Code sepsis was called.  Pt with prostate abscess, pyelonephritis and pt is going for cystoscopy by urology and possible draining of abscess. noted that pt has R middle lobe nodule with possible metastasis, pending CT of the chest following urology procedure.      PT Comments    Pt agreeable to PT for bed exercises only. Pt notes pain in low back "around bed sore". Education and encouragement provided for out of bed/change of position for decreasing pressure and allowing healing as well as strengthening benefits; pt continues to refuse. Pt participates in supine exercises with some noted improved active movement this session, but with quick fatigue especially on right. Continue PT to progress participation in out of bed activity, strength and endurance to improve all functional mobility.    Follow Up Recommendations  SNF     Equipment Recommendations  Rolling walker with 5" wheels    Recommendations for Other Services       Precautions / Restrictions Precautions Precautions: Fall Restrictions Weight Bearing Restrictions: No    Mobility  Bed Mobility               General bed mobility comments: Offered several times with education on need for change in position especially for bed sores. Pt continues to refuse  Transfers                    Ambulation/Gait                 Stairs            Wheelchair Mobility    Modified Rankin (Stroke Patients Only)       Balance                                            Cognition Arousal/Alertness: Awake/alert Behavior During  Therapy: WFL for tasks assessed/performed Overall Cognitive Status: Within Functional Limits for tasks assessed                                        Exercises General Exercises - Lower Extremity Ankle Circles/Pumps: AROM;Both;20 reps;Supine Quad Sets: Strengthening;Both;20 reps;Supine Gluteal Sets: Strengthening;Both;20 reps;Supine Short Arc Quad: AROM;Both;20 reps;Supine Heel Slides: AROM;Both;Supine;10 reps Hip ABduction/ADduction: AAROM;Both;Supine;20 reps Straight Leg Raises: AAROM;Both;Supine;10 reps    General Comments        Pertinent Vitals/Pain Pain Assessment: 0-10 Pain Score: 7  Pain Location: Low back "around bed sore" Pain Intervention(s): Other (comment) (educated on change position and offered OOB; pt refuses)    Home Living                      Prior Function            PT Goals (current goals can now be found in the care plan section) Progress towards PT goals: Not progressing toward goals - comment (slowly, )    Frequency  Min 2X/week      PT Plan Current plan remains appropriate    Co-evaluation              AM-PAC PT "6 Clicks" Daily Activity  Outcome Measure  Difficulty turning over in bed (including adjusting bedclothes, sheets and blankets)?: A Little Difficulty moving from lying on back to sitting on the side of the bed? : Unable Difficulty sitting down on and standing up from a chair with arms (e.g., wheelchair, bedside commode, etc,.)?: Unable Help needed moving to and from a bed to chair (including a wheelchair)?: A Lot Help needed walking in hospital room?: A Lot Help needed climbing 3-5 steps with a railing? : A Lot 6 Click Score: 11    End of Session   Activity Tolerance: Patient tolerated treatment well;Patient limited by fatigue Patient left: in bed;with call bell/phone within reach;with bed alarm set   PT Visit Diagnosis: Muscle weakness (generalized) (M62.81);Unsteadiness on feet  (R26.81);Difficulty in walking, not elsewhere classified (R26.2)     [id number] PT Time Calculation (min) (ACUTE ONLY): 23 min  Charges:  $Therapeutic Exercise: 23-37 mins                    G Codes:  Functional Assessment Tool Used: AM-PAC 6 Clicks Basic Mobility;Clinical judgement     Scot Dock, PTA 07/28/2017, 12:17 PM

## 2017-07-28 NOTE — Clinical Social Work Note (Signed)
Patient has a bed offer from [facility] in [place].  CSW updated patient, he is in agreement to going to SNF for IV antibiotics.  CSW to continue to follow patient's progress throughout discharge planning.  Ervin Knack. Simran Bomkamp, MSW, Theresia Majors [phone]  07/28/2017 6:11 PM

## 2017-07-28 NOTE — Progress Notes (Signed)
Patient presently resting in the bed, denies any pain at this time, vss, encouraged patient to get oob, patient decline. Consult for picc line placement, patient educated about the need for picc line .

## 2017-07-29 ENCOUNTER — Telehealth: Payer: Self-pay | Admitting: Urology

## 2017-07-29 LAB — GLUCOSE, CAPILLARY
GLUCOSE-CAPILLARY: 260 mg/dL — AB (ref 65–99)
GLUCOSE-CAPILLARY: 177 mg/dL — AB (ref 65–99)

## 2017-07-29 LAB — CULTURE, BLOOD (ROUTINE X 2): Special Requests: ADEQUATE

## 2017-07-29 MED ORDER — INSULIN ASPART 100 UNIT/ML ~~LOC~~ SOLN: 0.0000 [IU] | Freq: Three times a day (TID) | SUBCUTANEOUS | 11 refills | Status: DC

## 2017-07-29 MED ORDER — INSULIN ASPART 100 UNIT/ML ~~LOC~~ SOLN: 3.0000 [IU] | Freq: Three times a day (TID) | SUBCUTANEOUS | Status: DC

## 2017-07-29 MED ORDER — INSULIN ASPART 100 UNIT/ML ~~LOC~~ SOLN: 0.0000 [IU] | Freq: Three times a day (TID) | SUBCUTANEOUS | Status: DC

## 2017-07-29 MED ORDER — CEFAZOLIN IV (FOR PTA / DISCHARGE USE ONLY): 1.0000 g | Freq: Three times a day (TID) | INTRAVENOUS | 0 refills | Status: DC

## 2017-07-29 MED ORDER — INSULIN GLARGINE 100 UNIT/ML ~~LOC~~ SOLN: 14.0000 [IU] | Freq: Every day | SUBCUTANEOUS | 11 refills | Status: DC

## 2017-07-29 MED ORDER — METOPROLOL TARTRATE 25 MG PO TABS: 12.5000 mg | ORAL_TABLET | Freq: Two times a day (BID) | ORAL | 0 refills | Status: DC

## 2017-07-29 MED ORDER — INSULIN GLARGINE 100 UNIT/ML ~~LOC~~ SOLN
14.0000 [IU] | Freq: Every day | SUBCUTANEOUS | Status: DC
  Filled 2017-07-29: qty 0.14

## 2017-07-29 MED ORDER — INSULIN ASPART 100 UNIT/ML ~~LOC~~ SOLN: 0.0000 [IU] | Freq: Every day | SUBCUTANEOUS | 11 refills | Status: DC

## 2017-07-29 MED ORDER — INSULIN ASPART 100 UNIT/ML ~~LOC~~ SOLN: 3.0000 [IU] | Freq: Three times a day (TID) | SUBCUTANEOUS | 11 refills | Status: DC

## 2017-07-29 NOTE — Op Note (Signed)
NAMEMANNING, LUNA               ACCOUNT NO.:  [id number]  MEDICAL RECORD NO.:  [id number]  LOCATION:                                 FACILITY:  PHYSICIAN:  Excell Seltzer. Annabell Howells, M.D.         DATE OF BIRTH:  DATE OF PROCEDURE:  07/26/2017 DATE OF DISCHARGE:                              OPERATIVE REPORT   Patient of Dr. Luberta Mutter.  PROCEDURE PERFORMED:  Cystoscopy with transurethral resection of prostate with unroofing of prostatic abscess.  SURGEON:  Excell Seltzer. Annabell Howells, M.D.  PREOPERATIVE DIAGNOSIS:  Prostatic abscess.  POSTOPERATIVE DIAGNOSIS:  Prostatic abscess.  ANESTHESIA:  General.  SPECIMEN:  Prostate chips.  DRAINS:  A 22-French Foley catheter.  BLOOD LOSS:  Minimal.  COMPLICATIONS:  None.  INDICATIONS:  Cameron Coffey is a 56 year old African American male, who was admitted with sepsis.  He was found on CT to have a complex prostatic abscess, needs to undergo unroofing this evening.  DESCRIPTION OF PROCEDURE:  He was on Zosyn.  He was taken to the operating room, where general anesthetic was induced.  He was placed in lithotomy position and was fitted with PAS hose.  His perineum and genitalia were prepped with Betadine solution.  He was draped in usual sterile fashion.  Cystoscopy was performed using the 23-French scope with a 30-degree lens, but meatal dilation with Sissy Hoff sounds was required to pass the scope.  He was dilated from 20 to 32-French.  Cystoscopy revealed, otherwise normal urethra.  The external sphincter was intact.  The prostatic urethra was approximately 3 cm in length with bilobar hyperplasia with minimal obstruction.  Examination of bladder revealed smooth wall without tumor, stones, or inflammation.  The ureteral orifices were unremarkable, but the urine was quite turbid with some purulent debris.  Once cystoscopy had been performed, the 28-French continuous flow resectoscope sheath with the visual obturator was passed using the 30- degree  lens.  Visual obturator was replaced with an Wandra Scot handle with a bipolar loop and the 30-degree lens.  Saline was used as the irrigant.  The right floor of the prostate was resected from within the bladder neck, which I tried to spare for ejaculatory purposes out to alongside of the verumontanum.  The resection required to reach the abscess was quite deep.  The abscess was primarily in the mid apical region of the prostate and was bilateral.  Once it was widely unroofed and no further purulent material effluxed.  The abscess bed was fulgurated to provide hemostasis as well as the surrounding prostatic mucosa and resection bed.  Once hemostasis was achieved, inspection revealed no retained chips, no active bleeding.  The ureteral orifices were unremarkable and the external sphincter was intact.  The scope was removed.  Pressure on the bladder produced an excellent stream.  A 22-French Foley catheter was inserted with the aid of a catheter guide.  The balloon was filled with 30 mL of sterile fluid.  The catheter was irrigated with clear return and placed a straight drainage.  The patient was taken down from lithotomy position.  His anesthetic was reversed.  He was moved to the recovery room in stable condition.  There  were no complications.     Excell Seltzer. Annabell Howells, M.D.   ______________________________ Excell Seltzer. Annabell Howells, M.D.    JJW/MEDQ  D:  07/26/2017  T:  07/27/2017  Job:  [id number]

## 2017-07-29 NOTE — Progress Notes (Signed)
Couldn't take AM vitals today. All vital sign machines in use. Cameron Coffey M Hetvi Shawhan 

## 2017-07-29 NOTE — Progress Notes (Addendum)
Called report to SNF nurse at this time. All questions answered. Raynald Blend  Called EMS for non-emergent patient transport to the facility. Jari Favre [facility]

## 2017-07-29 NOTE — Clinical Social Work Placement (Signed)
   CLINICAL SOCIAL WORK PLACEMENT  NOTE  Date:  07/29/2017  Patient Details  Name: Cameron Coffey MRN: [id number] Date of Birth: 1961/06/19  Clinical Social Work is seeking post-discharge placement for this patient at the Skilled  Nursing Facility level of care (*CSW will initial, date and re-position this form in  chart as items are completed):  Yes   Patient/family provided with [facility] Clinical Social Work Department's list of facilities offering this level of care within the geographic area requested by the patient (or if unable, by the patient's family).  Yes   Patient/family informed of their freedom to choose among providers that offer the needed level of care, that participate in Medicare, Medicaid or managed care program needed by the patient, have an available bed and are willing to accept the patient.  Yes   Patient/family informed of [facility]'s ownership interest in [facility] and [facility], as well as of the fact that they are under no obligation to receive care at these facilities.  PASRR submitted to EDS on 07/27/17     PASRR number received on 07/27/17     Existing PASRR number confirmed on       FL2 transmitted to all facilities in geographic area requested by pt/family on 07/27/17     FL2 transmitted to all facilities within larger geographic area on       Patient informed that his/her managed care company has contracts with or will negotiate with certain facilities, including the following:        Yes   Patient/family informed of bed offers received.  Patient chooses bed at Other - please specify in the comment section below: [facility])     Physician recommends and patient chooses bed at      Patient to be transferred to   on 07/29/17.  Patient to be transferred to facility by [facility]     Patient family notified on 07/29/17 of transfer.  Name of family member notified:  Sister Michaell Cowing     PHYSICIAN Please sign FL2      Additional Comment:    _______________________________________________ Darleene Cleaver, LCSWA 07/29/2017, 3:10 PM

## 2017-07-29 NOTE — Discharge Instructions (Signed)
Fever, Adult A fever is an increase in the body's temperature. It is often defined as a temperature of 100 F (38C) or higher. Short mild or moderate fevers often have no long-term effects. They also often do not need treatment. Moderate or high fevers may make you feel uncomfortable. Sometimes, they can also be a sign of a serious illness or disease. The sweating that may happen with repeated fevers or fevers that last a while may also cause you to not have enough fluid in your body (dehydration). You can take your temperature with a thermometer to see if you have a fever. A measured temperature can change with:  Age.  Time of day.  Where the thermometer is placed: ? Mouth (oral). ? Rectum (rectal). ? Ear (tympanic). ? Underarm (axillary). ? Forehead (temporal).  Follow these instructions at home: Pay attention to any changes in your symptoms. Take these actions to help with your condition:  Take over-the-counter and prescription medicines only as told by your doctor. Follow the dosing instructions carefully.  If you were prescribed an antibiotic medicine, take it as told by your doctor. Do not stop taking the antibiotic even if you start to feel better.  Rest as needed.  Drink enough fluid to keep your pee (urine) clear or pale yellow.  Sponge yourself or bathe with room-temperature water as needed. This helps to lower your body temperature . Do not use ice water.  Do not wear too many blankets or heavy clothes.  Contact a doctor if:  You throw up (vomit).  You cannot eat or drink without throwing up.  You have watery poop (diarrhea).  It hurts when you pee.  Your symptoms do not get better with treatment.  You have new symptoms.  You feel very weak. Get help right away if:  You are short of breath or have trouble breathing.  You are dizzy or you pass out (faint).  You feel confused.  You have signs of not having enough fluid in your body, such as: ? A dry  mouth. ? Peeing less. ? Looking pale.  You have very bad pain in your belly (abdomen).  You keep throwing up or having water poop.  You have a skin rash.  Your symptoms suddenly get worse. This information is not intended to replace advice given to you by your health care provider. Make sure you discuss any questions you have with your health care provider. Document Released: 08/04/2008 Document Revised: 04/02/2016 Document Reviewed: 12/20/2014 Elsevier Interactive Patient Education  2018 Elsevier Inc.  

## 2017-07-29 NOTE — Plan of Care (Signed)
Problem: Physical Regulation: Goal: Signs and symptoms of infection will decrease Outcome: Progressing Patient now has P.I.C.C. for long-term ABX administration. Jari Favre [facility]

## 2017-07-29 NOTE — Progress Notes (Signed)
[facility] [facility] [place] NAME: Cameron Coffey    MR#:  [id number]  DATE OF BIRTH:  [birth year]  SUBJECTIVE:  Patient has MSSA bacteremia.drainage  Of  Prostate abscess , now has a Foley. Afebrile now. Constipation improved.   CHIEF COMPLAINT:   Chief Complaint  Patient presents with  . Fever  . Hyperglycemia    REVIEW OF SYSTEMS:    Review of Systems  Constitutional: Negative for chills and fever.  HENT: Negative for hearing loss.   Eyes: Negative for blurred vision, double vision and photophobia.  Respiratory: Negative for cough, hemoptysis and shortness of breath.   Cardiovascular: Negative for palpitations, orthopnea and leg swelling.  Gastrointestinal: Negative for abdominal pain, diarrhea and vomiting.  Genitourinary: Negative for dysuria and urgency.  Musculoskeletal: Positive for back pain and myalgias. Negative for neck pain.  Skin: Negative for rash.  Neurological: Positive for weakness. Negative for dizziness, focal weakness, seizures and headaches.  Psychiatric/Behavioral: Negative for memory loss. The patient does not have insomnia.     Nutrition: Tolerating Diet: Tolerating PT:      DRUG ALLERGIES:  No Known Allergies  VITALS:  Blood pressure 124/84, pulse 71, temperature 98.4 F (36.9 C), temperature source Oral, resp. rate 14, height 5' 8"  (1.727 m), weight 64.4 kg (142 lb), SpO2 100 %.  PHYSICAL EXAMINATION:   Physical Exam  GENERAL:  56 y.o.-year-old patient lying in the bed with no acute distress. Appears ill. EYES: Pupils equal, round, reactive to light   No scleral icterus. Extraocular muscles intact.  HEENT: Head atraumatic, normocephalic. Oropharynx and nasopharynx clear.  NECK:  Supple, no jugular venous distention. No thyroid enlargement, no tenderness.  Bump on Right upper back,not erythematous.non tender,. LUNGS: Normal breath sounds bilaterally, no wheezing, rales,rhonchi or crepitation. No use  of accessory muscles of respiration.  CARDIOVASCULAR: S1, S2 normal. No murmurs, rubs, or gallops.  ABDOMEN: Soft, nontender, nondistended. Bowel sounds present. No organomegaly or mass. Foley present, and  Draining blood tinged urine. EXTREMITIES: No pedal edema, cyanosis, or clubbing.  NEUROLOGIC: Cranial nerves II through XII are intact. Muscle strength 5/5 in all extremities. Sensation intact. Gait not checked. Patient complains of upper back pain, noted to have small left lipoma on the right scapular region. PSYCHIATRIC: The patient is alert and oriented x 3.  SKIN: No obvious rash, lesion, or ulcer.    LABORATORY PANEL:   CBC  Recent Labs Lab 07/27/17 1300  WBC 8.1  HGB 12.4*  HCT 36.3*  PLT 136*   ------------------------------------------------------------------------------------------------------------------  Chemistries   Recent Labs Lab 07/28/17 1233  NA 130*  K 3.4*  CL 98*  CO2 26  GLUCOSE 231*  BUN 8  CREATININE 0.70  CALCIUM 7.3*   ------------------------------------------------------------------------------------------------------------------  Cardiac Enzymes  Recent Labs Lab 07/24/17 1223  TROPONINI 0.04*   ------------------------------------------------------------------------------------------------------------------  RADIOLOGY:  Ct Chest W Contrast  Result Date: 07/27/2017 CLINICAL DATA:  History of pneumonia, followup, former smoking history EXAM: CT CHEST WITH CONTRAST TECHNIQUE: Multidetector CT imaging of the chest was performed during intravenous contrast administration. CONTRAST:  57m ISOVUE-300 IOPAMIDOL (ISOVUE-300) INJECTION 61% COMPARISON:  Chest x-ray of 07/24/2017 FINDINGS: Cardiovascular: Variation of an aberrant right subclavian artery is noted coursing posterior to the esophagus. The thoracic aorta is well opacified with no acute abnormality noted. Mild thoracic aortic atherosclerotic change present. The pulmonary arteries are  not as well opacified but no abnormality is seen. There are coronary artery calcifications primarily in the distribution of the  left anterior descending. The heart is within upper limits of normal. No pericardial effusion is seen. The mid ascending thoracic aorta measures 31 mm in diameter. Mediastinum/Nodes: No mediastinal or hilar adenopathy is seen the thyroid gland is normal in size. Lungs/Pleura: On lung window images, there is a moderate size left pleural effusion present. There may be a small amount air within this collection of fluid posterior, and followup is recommended. Opacity posteriorly in the left lower lobe is most consistent with atelectasis or pneumonia. A few air bronchograms are noted through this area of dense partial consolidation. Mild atelectasis is noted posteriorly at the right lung base. There is a pleural-based nodular opacity anteriorly in the right middle lobe which on axial image 85 series 3 measures 8 mm, being slightly more prominent on sagittal images. A developing lung neoplasm cannot be excluded and continued followup is recommended in 4-6 months by CT the chest. Upper Abdomen: There does appear to be ascites within the upper abdomen. There is protrusion of the anterior margin of the left lobe of liver as on image 130 series 2 and a hepatic mass cannot be excluded. Abdominal ultrasound may be helpful to assess, but MR of the abdomen would be preferable to evaluate this area further. Musculoskeletal: The thoracic vertebrae are in normal alignment with no compression deformity noted. IMPRESSION: 1. Moderate size left pleural effusion. Small amount air may be within this fluid collection and continued followup is recommended. 2. Opacity posteriorly in the left lower lobe consistent with atelectasis or pneumonia. 3. 8 mm pleural-based opacity in the anterior right middle lobe. Cannot exclude neoplasm. Recommend CT of the chest is followup in 4-6 months. 4. Possible hepatic lesion  anteriorly in the left lobe. Consider ultrasound or preferably MRI to assess further. Ascites in the upper abdomen. 5. Aberrant right subclavian artery. Electronically Signed   By: Ivar Drape M.D.   On: 07/27/2017 16:10     ASSESSMENT AND PLAN:   Active Problems:   Sepsis ([place])   1 .staph aureus bacteremia, urine culture showed staph aureus. First blood culture showed MSSA.Repeat blood cultures No growth for 3 days. Received a PICC line, For prolonged duration of iv abx,, patient needs IV Ancef 2 g every 8 hours until October 29. Patient needs CBC, Chem-7, CRP, ESR weekly. And fax results to Dr. Ola Spurr.  Patient has moderate left pleural effusion, left lower lobe pneumonia, pleural-based opacity in the right middle lobe unable to exclude neoplasm, follow-up CT chest in 4-6 month,ID is  following regarding further infection workup. #2. hyponatremia due to dehydration: Improving with IV fluids  3.Hyperkalemia;Status post Kayexalate. Hyperkalemia resolved.  #4. diabetes mellitus type 2 poorly controlled, hemoglobin A1c is 14. Seen by diabetes coordinator. On Lantus 14 units, add NovoLog 3 units 3 times a day,  Will decrease Novolog correction to moderate correction scale 0-15 units tid  #5. Elevated troponin secondary to demand ischemia from sepsis. Seen by cardiology. Echocardiogram is pending. Troponin levels are coming down.   echocardiogram." Shows no wall motion abnormality discontinue heparin.  #6 /prostate abscess, pyelonephritis: Status post drainage of prostate abscess yesterday by urology, now has a Foley. Continue Foley for 2 weeks as per urologyrecommendation, follow prostate chips for biopsy.  Overall prognosis . Weight loss, pyelonephritis, poorly controlled diabetes mellitus, bacteremia  #8. Deconditioning; patient told me he is homeless:  Physical therapy recommends SNF.  All records are reviewed and case discussed with Care Management/Social Workerr. Management plans  discussed with the patient, family and  they are in agreement.  CODE STATUS: full  TOTAL TIME TAKING CARE OF THIS PATIENT: 35 minutes.   POSSIBLE D/C IN 1-2DAYS, DEPENDING ON CLINICAL CONDITION.   Epifanio Lesches M.D on 07/29/2017 at 12:15 PM  Between 7am to 6pm - Pager - [phone]  After 6pm go to [email or web address] - password [email or web address] [facility]  Office  [phone]  CC: Primary care physician; Jearld Fenton, NP

## 2017-07-29 NOTE — Telephone Encounter (Signed)
App made ° ° °Cameron Coffey  °

## 2017-07-29 NOTE — Discharge Summary (Signed)
Cameron Coffey, is a 56 y.o. male  DOB [birth year]  MRN [id number].  Admission date:  07/24/2017  Admitting Physician  Saundra Shelling, MD  Discharge Date:  07/29/2017   Primary MD  Jearld Fenton, NP  Recommendations for primary care physician for things to follow:   Discharged to the nursing [facility]  in [place], follow up with Ola Spurr in 2 weeks PCP in 1 week Follow up with [facility] in 2 weeks  Admission Diagnosis  Hyperglycemia [R73.9] Sepsis, due to unspecified organism [facility]) [A41.9] Urinary tract infection without hematuria, site unspecified [N39.0]   Discharge Diagnosis  Hyperglycemia [R73.9] Sepsis, due to unspecified organism ([place]) [A41.9] Urinary tract infection without hematuria, site unspecified [N39.0]    Active Problems:   Sepsis [facility])      Past Medical History:  Diagnosis Date  . Diabetes mellitus without complication [facility])     Past Surgical History:  Procedure Laterality Date  . none    . TRANSURETHRAL RESECTION OF PROSTATE N/A 07/26/2017   Procedure: TRANSURETHRAL RESECTION OF THE PROSTATE (TURP) WITH UPROOFING OF PROSTATE ABSCESS;  Surgeon: Irine Seal, MD;  Location: [facility] ORS;  Service: Urology;  Laterality: N/A;       History of present illness and  Hospital Course:     Kindly see H&P for history of present illness and admission details, please review complete Labs, Consult reports and Test reports for all details in brief  HPI  from the history and physical done on the day of admission 56 year old male patient with history of diabetes mellitus type 2, hyperlipidemia, noncompliance with medications for diabetes comes in with high fever, generalized weakness, found to have UTI, admitted to medical service, started on antibiotics, IV fluids, patient found to have sepsis with  elevated lactic acid on admission.   Hospital Course  #1 sepsis present on admission secondary to UTI: Patient has elevated lactic acid, fever, chills, hyponatremia, hyperkalemia and admission: Received IV vancomycin, Zosyn, aggressive hydration. Patient blood cultures showed staph aureus, MSSA, also has MSSA in the urine. Antibiotics were changed from Zosyn to Ancef. Initial blood cultures showed MSSA, elevated blood cultures have been negative for 3 days, received a PICC line, seen by Dr. Ola Spurr or he recommended IV Ancef 1 g IV every 8 hours until October 29  #2 sepsis, trouble urinating, patient found to have prostate abscess by CT abdomen, seen by urology, CT abdomen showed organized fluid collection in the prostate worrisome for abscess. Patient had prostate abscess drained by urology on September 17. After the abscess drained patient received Foley catheter, according to urology please keep the Foley in for 2 weeks and follow up with urology as an outpatient regarding biopsy of prostate chips.  #3/diabetes mellitus type 2: By diabetic nurse. Hemoglobin A1c is 12. Noncompliance with medications at home due to financial reasons. Supposed to be on glipizide, metformin at home but not taking it regularly. He is started on Lantus, NovoLog with sliding scale coverage. Patient sugars have been in range of 267 General Lantus to 14 units, added NovoLog 3 units 3 times a day with meals, patient also is on NovoLog correction to moderate  correction scale 0-15 units 3 times a day./  #4 elevated troponin secondary to demand ischemia from sepsis. Seen by cardiology. Echocardiogram showed EF 50% with normal wall motion abnormality. Patient also had echocardiogram bacteremia and echo did not show any vegetations. #5 deconditioning: Physical therapy recommended skilled nursing, patient is going to [facility] in [place].  #6  left lower lobe pneumonia, motor pleural effusion, possible opacity in the right  middle lobe: Patient had a CT chest, they recommended CT chest in 4-6 months. #Hyponatremia improved with fluids Hyperkalemia treated.  Discharge Condition: Stable   Follow UP Follow up with urology in 2 weeks Follow-up with that procedure and also in 10 days     Discharge Instructions  and  Discharge Medications     Discharge Instructions    Home infusion instructions [facility] May follow [place] Dosing Protocol; May administer Cathflo as needed to maintain patency of vascular access device.; Flushing of vascular access device: per [facility] Protocol: 0.9% NaCl pre/post medica...    Complete by:  As directed    Instructions:  May follow [place] Dosing Protocol   Instructions:  May administer Cathflo as needed to maintain patency of vascular access device.   Instructions:  Flushing of vascular access device: per [facility] Protocol: 0.9% NaCl pre/post medication administration and prn patency; Heparin 100 u/ml, 50m for implanted ports and Heparin 10u/ml, 536mfor all other central venous catheters.   Instructions:  May follow [facility] Anaphylaxis Protocol for First Dose Administration in the home: 0.9% NaCl at 25-50 ml/hr to maintain IV access for protocol meds. Epinephrine 0.3 ml IV/IM PRN and Benadryl 25-50 IV/IM PRN s/s of anaphylaxis.   Instructions:  [facility] Coordinator (RN) to assist per patient IV care needs in the home PRN.     Allergies as of 07/29/2017   No Known Allergies     Medication List    TAKE these medications   ceFAZolin IVPB Commonly known as:  ANCEF Inject 1 g into the vein every 8 (eight) hours. Indication:  MSSA bacteremia Last Day of Therapy: october 29th Labs - Once weekly:  CBC/D and BMP, Labs - Every other week:  ESR and CRP   glipiZIDE 10 MG tablet Commonly known as:  GLUCOTROL Take 1 tablet (10 mg total) by mouth 2 (two) times daily before a meal.   insulin aspart 100 UNIT/ML injection Commonly known as:  novoLOG Inject 0-15  Units into the skin 3 (three) times daily with meals.   insulin aspart 100 UNIT/ML injection Commonly known as:  novoLOG Inject 0-5 Units into the skin at bedtime.   insulin aspart 100 UNIT/ML injection Commonly known as:  novoLOG Inject 3 Units into the skin 3 (three) times daily with meals.   insulin glargine 100 UNIT/ML injection Commonly known as:  LANTUS Inject 0.14 mLs (14 Units total) into the skin at bedtime.   lisinopril 5 MG tablet Commonly known as:  PRINIVIL,ZESTRIL Take 1 tablet (5 mg total) by mouth daily.   metFORMIN 1000 MG tablet Commonly known as:  GLUCOPHAGE Take 1 tablet (1,000 mg total) by mouth 2 (two) times daily.   metoprolol tartrate 25 MG tablet Commonly known as:  LOPRESSOR Take 0.5 tablets (12.5 mg total) by mouth 2 (two) times daily.   simvastatin 40 MG tablet Commonly known as:  ZOCOR Take 1 tablet (40 mg total) by mouth daily.            Home Infusion Instuctions        Start     Ordered   07/29/17 0000  Home infusion instructions [facility] May follow [place] Protocol; May administer Cathflo as needed to maintain patency of vascular access device.; Flushing of vascular access device: [facility]: 0.9% NaCl pre/post medica...    Question Answer Comment  Instructions May  follow [place] Dosing Protocol   Instructions May administer Cathflo as needed to maintain patency of vascular access device.   Instructions Flushing of vascular access device: per [facility] Protocol: 0.9% NaCl pre/post medication administration and prn patency; Heparin 100 u/ml, 6m for implanted ports and Heparin 10u/ml, 528mfor all other central venous catheters.   Instructions May follow [facility] Anaphylaxis Protocol for First Dose Administration in the home: 0.9% NaCl at 25-50 ml/hr to maintain IV access for protocol meds. Epinephrine 0.3 ml IV/IM PRN and Benadryl 25-50 IV/IM PRN s/s of anaphylaxis.   Instructions [facility] Infusion  Coordinator (RN) to assist per patient IV care needs in the home PRN.      07/29/17 1236       Discharge Care Instructions        Start     Ordered   07/29/17 0000  insulin aspart (NOVOLOG) 100 UNIT/ML injection  3 times daily with meals     07/29/17 1236   07/29/17 0000  insulin aspart (NOVOLOG) 100 UNIT/ML injection  Daily at bedtime     07/29/17 1236   07/29/17 0000  insulin aspart (NOVOLOG) 100 UNIT/ML injection  3 times daily with meals     07/29/17 1236   07/29/17 0000  insulin glargine (LANTUS) 100 UNIT/ML injection  Daily at bedtime     07/29/17 1236   07/29/17 0000  metoprolol tartrate (LOPRESSOR) 25 MG tablet  2 times daily     07/29/17 1236   07/29/17 0000  Home infusion instructions [facility] May follow [place] Protocol; May administer Cathflo as needed to maintain patency of vascular access device.; Flushing of vascular access device: [facility]: 0.9% NaCl pre/post medica...    Question Answer Comment  Instructions May follow [place] Protocol   Instructions May administer Cathflo as needed to maintain patency of vascular access device.   Instructions Flushing of vascular access device: per [facility]: 0.9% NaCl pre/post medication administration and prn patency; Heparin 100 u/ml, 47m647mor implanted ports and Heparin 10u/ml, 47ml32mr all other central venous catheters.   Instructions May follow [facility] Anaphylaxis Protocol for First Dose Administration in the home: 0.9% NaCl at 25-50 ml/hr to maintain IV access for protocol meds. Epinephrine 0.3 ml IV/IM PRN and Benadryl 25-50 IV/IM PRN s/s of anaphylaxis.   Instructions [facility] Infusion Coordinator (RN) to assist per patient IV care needs in the home PRN.      07/29/17 1236   07/29/17 0000  ceFAZolin (ANCEF) IVPB  Every 8 hours     07/29/17 1236        Diet and Activity recommendation: See Discharge Instructions above   Consults obtained -  urology cardiology, ID,  diabetic nurse, physical therapy   Major procedures and Radiology Reports - PLEASE review detailed and final reports for all details, in brief -     Ct Abdomen Pelvis W Wo Contrast  Result Date: 07/26/2017 CLINICAL DATA:  Hematuria, weight loss, abdominal pain. Urinary tract infection. EXAM: CT ABDOMEN AND PELVIS WITHOUT AND WITH CONTRAST TECHNIQUE: Multidetector CT imaging of the abdomen and pelvis was performed following the standard protocol before and following the bolus administration of intravenous contrast. CONTRAST:  150mL46mVUE-300 IOPAMIDOL (ISOVUE-300) INJECTION 61% COMPARISON:  None. FINDINGS: Lower chest: 8 mm nodule seen in the right middle lobe. Image quality is degraded by respiratory motion. Small left pleural effusion collapse/ consolidation in the left lower lobe. Heart size normal. No pericardial effusion.  Distal esophagus is grossly unremarkable. Hepatobiliary: Liver margin is markedly irregular. Stones are seen in the gallbladder. No biliary ductal dilatation. Pancreas: Negative. Spleen: Negative. Adrenals/Urinary Tract: Adrenal glands are unremarkable. Stones are seen in the kidneys bilaterally. There is a striated appearance in both kidneys with areas of ill-defined low attenuation on portal venous phase imaging. 1.3 cm low-attenuation lesion is seen in the interpolar left kidney (series 8, image 31), too small to characterize. On delayed imaging there is low attenuation material in the nondependent portion of the bladder. Stomach/Bowel: Stomach, small bowel, appendix and colon are grossly unremarkable. Vascular/Lymphatic: Atherosclerotic calcification of the arterial vasculature without abdominal aortic aneurysm. Borderline enlarged porta hepatis lymph nodes, likely reactive. Reproductive: Prostate is enlarged and contains a large area of low-attenuation, measuring approximately 3.9 x 4.1 cm. Other: Moderate ascites.  Mild presacral edema. Musculoskeletal: Diffuse body wall edema.  There are multiple fluid collections within the chest wall musculature, some of which are best seen on nephrographic phase imaging and some of which have peripheral high attenuation on portal venous phase imaging. Index collection in the left lateral chest wall measures 3.3 cm (incompletely imaged, series 5, image 1). A 1.3 cm low-attenuation collection with peripheral high attenuation is seen in the high right thigh musculature (series 5, image 76). No worrisome lytic or sclerotic lesions. IMPRESSION: 1. Organized fluid collections within the prostate and soft tissue musculature, highly worrisome for abscesses. 2. Striated appearance of both kidneys, most indicative of pyelonephritis. Difficult to exclude developing abscesses. 3. Debris in the bladder.  Difficult to exclude a mass. 4. Right middle lobe nodule. Metastatic disease is not excluded. Continued attention on follow-up is recommended. 5. Cirrhosis with moderate ascites. 6. Small left pleural effusion with collapse/consolidation in the left lower lobe. 7. Bilateral renal stones. 8. Cholelithiasis. 9.  Aortic atherosclerosis (ICD10-170.0). Electronically Signed   By: Lorin Picket M.D.   On: 07/26/2017 08:03   Dg Chest 2 View  Result Date: 07/15/2017 CLINICAL DATA:  Weight loss.  Diabetes. EXAM: CHEST  2 VIEW COMPARISON:  None. FINDINGS: The heart size and mediastinal contours are within normal limits. Both lungs are clear. The visualized skeletal structures are unremarkable. IMPRESSION: No active cardiopulmonary disease. Electronically Signed   By: Van Clines M.D.   On: 07/15/2017 13:42   Ct Chest W Contrast  Result Date: 07/27/2017 CLINICAL DATA:  History of pneumonia, followup, former smoking history EXAM: CT CHEST WITH CONTRAST TECHNIQUE: Multidetector CT imaging of the chest was performed during intravenous contrast administration. CONTRAST:  43m ISOVUE-300 IOPAMIDOL (ISOVUE-300) INJECTION 61% COMPARISON:  Chest x-ray of 07/24/2017  FINDINGS: Cardiovascular: Variation of an aberrant right subclavian artery is noted coursing posterior to the esophagus. The thoracic aorta is well opacified with no acute abnormality noted. Mild thoracic aortic atherosclerotic change present. The pulmonary arteries are not as well opacified but no abnormality is seen. There are coronary artery calcifications primarily in the distribution of the left anterior descending. The heart is within upper limits of normal. No pericardial effusion is seen. The mid ascending thoracic aorta measures 31 mm in diameter. Mediastinum/Nodes: No mediastinal or hilar adenopathy is seen the thyroid gland is normal in size. Lungs/Pleura: On lung window images, there is a moderate size left pleural effusion present. There may be a small amount air within this collection of fluid posterior, and followup is recommended. Opacity posteriorly in the left lower lobe is most consistent with atelectasis or pneumonia. A few air bronchograms are noted through this area of dense partial  consolidation. Mild atelectasis is noted posteriorly at the right lung base. There is a pleural-based nodular opacity anteriorly in the right middle lobe which on axial image 85 series 3 measures 8 mm, being slightly more prominent on sagittal images. A developing lung neoplasm cannot be excluded and continued followup is recommended in 4-6 months by CT the chest. Upper Abdomen: There does appear to be ascites within the upper abdomen. There is protrusion of the anterior margin of the left lobe of liver as on image 130 series 2 and a hepatic mass cannot be excluded. Abdominal ultrasound may be helpful to assess, but MR of the abdomen would be preferable to evaluate this area further. Musculoskeletal: The thoracic vertebrae are in normal alignment with no compression deformity noted. IMPRESSION: 1. Moderate size left pleural effusion. Small amount air may be within this fluid collection and continued followup is  recommended. 2. Opacity posteriorly in the left lower lobe consistent with atelectasis or pneumonia. 3. 8 mm pleural-based opacity in the anterior right middle lobe. Cannot exclude neoplasm. Recommend CT of the chest is followup in 4-6 months. 4. Possible hepatic lesion anteriorly in the left lobe. Consider ultrasound or preferably MRI to assess further. Ascites in the upper abdomen. 5. Aberrant right subclavian artery. Electronically Signed   By: Ivar Drape M.D.   On: 07/27/2017 16:10   US Pelvis Limited (transabdominal Only)  Result Date: 07/25/2017 CLINICAL DATA:  Urinary retention and possible benign prostatic hypertrophy. EXAM: LIMITED ULTRASOUND OF PELVIS TECHNIQUE: Limited transabdominal ultrasound examination of the pelvis was performed. COMPARISON:  None. FINDINGS: Posterior urinary bladder wall mass greater than expected for the inter your icteric ridge shown for example on image 13, there is a 5.9 by 1.0 by 5.1 cm in thickness. Transitional cell carcinoma is a distinct possibility. The the prostate is moderately enlarged, volume calculated at 69 cubic cm. Calcifications in the prostate parenchyma are observed. Prevoid bladder volume was 146 cubic cm. Postvoid volume was 38 cubic cm. IMPRESSION: 1. Possible posterior bladder wall mass, for example image 13, transitional cell carcinoma is not excluded and cystoscopy is recommended. 2. Moderate enlargement the prostate gland at 69 cubic cm. Electronically Signed   By: Van Clines M.D.   On: 07/25/2017 12:19   Dg Chest Port 1 View  Result Date: 07/24/2017 CLINICAL DATA:  Sepsis, hypoxia, rule out pneumonia. EXAM: PORTABLE CHEST 1 VIEW COMPARISON:  Chest radiograph 07/15/2017 FINDINGS: The cardiomediastinal contours are normal. The lungs are clear. Pulmonary vasculature is normal. No consolidation, pleural effusion, or pneumothorax. No acute osseous abnormalities are seen. IMPRESSION: No acute abnormality.  No evidence of pneumonia.  Electronically Signed   By: Jeb Levering M.D.   On: 07/24/2017 05:03    Micro Results     Recent Results (from the past 240 hour(s))  Blood culture (routine x 2)     Status: Abnormal   Collection Time: 07/24/17  4:24 AM  Result Value Ref Range Status   Specimen Description BLOOD LEFT HAND  Final   Special Requests   Final    BOTTLES DRAWN AEROBIC AND ANAEROBIC Blood Culture adequate volume   Culture  Setup Time   Final    GRAM POSITIVE COCCI IN BOTH AEROBIC AND ANAEROBIC BOTTLES CRITICAL RESULT CALLED TO, READ BACK BY AND VERIFIED WITH: [facility] HALLAJI AT 1938 07/24/17.PMH    Culture (A)  Final    STAPHYLOCOCCUS AUREUS SUSCEPTIBILITIES PERFORMED ON PREVIOUS CULTURE WITHIN THE LAST 5 DAYS. Performed at [facility], [place]  [address] [place], [place] [address]    Report Status 07/27/2017 FINAL  Final  Blood culture (routine x 2)     Status: Abnormal   Collection Time: 07/24/17  4:24 AM  Result Value Ref Range Status   Specimen Description BLOOD RIGHT HAND  Final   Special Requests   Final    BOTTLES DRAWN AEROBIC AND ANAEROBIC Blood Culture adequate volume   Culture  Setup Time   Final    GRAM POSITIVE COCCI IN BOTH AEROBIC AND ANAEROBIC BOTTLES CRITICAL RESULT CALLED TO, READ BACK BY AND VERIFIED WITH: [facility] HALLAJI AT 1938 07/24/17.PMH    Culture STAPHYLOCOCCUS AUREUS (A)  Final   Report Status 07/27/2017 FINAL  Final   Organism ID, Bacteria STAPHYLOCOCCUS AUREUS  Final      Susceptibility   Staphylococcus aureus - MIC*    CIPROFLOXACIN <=0.5 SENSITIVE Sensitive     ERYTHROMYCIN <=0.25 SENSITIVE Sensitive     GENTAMICIN <=0.5 SENSITIVE Sensitive     OXACILLIN 0.5 SENSITIVE Sensitive     TETRACYCLINE <=1 SENSITIVE Sensitive     VANCOMYCIN <=0.5 SENSITIVE Sensitive     TRIMETH/SULFA <=10 SENSITIVE Sensitive     CLINDAMYCIN <=0.25 SENSITIVE Sensitive     RIFAMPIN <=0.5 SENSITIVE Sensitive     Inducible Clindamycin NEGATIVE Sensitive     * STAPHYLOCOCCUS AUREUS   Blood Culture ID Panel (Reflexed)     Status: Abnormal   Collection Time: 07/24/17  4:24 AM  Result Value Ref Range Status   Enterococcus species NOT DETECTED NOT DETECTED Final   Listeria monocytogenes NOT DETECTED NOT DETECTED Final   Staphylococcus species DETECTED (A) NOT DETECTED Final    Comment: CRITICAL RESULT CALLED TO, READ BACK BY AND VERIFIED WITH: SHEEMA HALLAJI AT 1938 07/24/17.PMH    Staphylococcus aureus DETECTED (A) NOT DETECTED Final    Comment: Methicillin (oxacillin) susceptible Staphylococcus aureus (MSSA). Preferred therapy is anti staphylococcal beta lactam antibiotic (Cefazolin or Nafcillin), unless clinically contraindicated. CRITICAL RESULT CALLED TO, READ BACK BY AND VERIFIED WITH: [facility] HALLAJI AT 1938 07/24/17.PMH    Methicillin resistance NOT DETECTED NOT DETECTED Final   Streptococcus species NOT DETECTED NOT DETECTED Final   Streptococcus agalactiae NOT DETECTED NOT DETECTED Final   Streptococcus pneumoniae NOT DETECTED NOT DETECTED Final   Streptococcus pyogenes NOT DETECTED NOT DETECTED Final   Acinetobacter baumannii NOT DETECTED NOT DETECTED Final   Enterobacteriaceae species NOT DETECTED NOT DETECTED Final   Enterobacter cloacae complex NOT DETECTED NOT DETECTED Final   Escherichia coli NOT DETECTED NOT DETECTED Final   Klebsiella oxytoca NOT DETECTED NOT DETECTED Final   Klebsiella pneumoniae NOT DETECTED NOT DETECTED Final   Proteus species NOT DETECTED NOT DETECTED Final   Serratia marcescens NOT DETECTED NOT DETECTED Final   Haemophilus influenzae NOT DETECTED NOT DETECTED Final   Neisseria meningitidis NOT DETECTED NOT DETECTED Final   Pseudomonas aeruginosa NOT DETECTED NOT DETECTED Final   Candida albicans NOT DETECTED NOT DETECTED Final   Candida glabrata NOT DETECTED NOT DETECTED Final   Candida krusei NOT DETECTED NOT DETECTED Final   Candida parapsilosis NOT DETECTED NOT DETECTED Final   Candida tropicalis NOT DETECTED NOT DETECTED  Final  Urine Culture     Status: Abnormal   Collection Time: 07/25/17  3:34 AM  Result Value Ref Range Status   Specimen Description URINE, RANDOM  Final   Special Requests NONE  Final   Culture >=100,000 COLONIES/mL STAPHYLOCOCCUS AUREUS (A)  Final   Report Status 07/27/2017 FINAL  Final  Organism ID, Bacteria STAPHYLOCOCCUS AUREUS (A)  Final      Susceptibility   Staphylococcus aureus - MIC*    CIPROFLOXACIN <=0.5 SENSITIVE Sensitive     GENTAMICIN <=0.5 SENSITIVE Sensitive     NITROFURANTOIN <=16 SENSITIVE Sensitive     OXACILLIN <=0.25 SENSITIVE Sensitive     TETRACYCLINE <=1 SENSITIVE Sensitive     VANCOMYCIN 1 SENSITIVE Sensitive     TRIMETH/SULFA <=10 SENSITIVE Sensitive     CLINDAMYCIN <=0.25 SENSITIVE Sensitive     RIFAMPIN <=0.5 SENSITIVE Sensitive     Inducible Clindamycin NEGATIVE Sensitive     * >=100,000 COLONIES/mL STAPHYLOCOCCUS AUREUS  Culture, blood (Routine X 2) w Reflex to ID Panel     Status: Abnormal   Collection Time: 07/25/17  5:17 PM  Result Value Ref Range Status   Specimen Description BLOOD RIGHT ANTECUBITAL  Final   Special Requests   Final    BOTTLES DRAWN AEROBIC AND ANAEROBIC Blood Culture adequate volume   Culture  Setup Time   Final    GRAM POSITIVE COCCI IN BOTH AEROBIC AND ANAEROBIC BOTTLES CRITICAL RESULT CALLED TO, READ BACK BY AND VERIFIED WITH: KAREN HAYES AT 1444 ON 07/26/2017 JJB    Culture (A)  Final    STAPHYLOCOCCUS AUREUS SUSCEPTIBILITIES PERFORMED ON PREVIOUS CULTURE WITHIN THE LAST 5 DAYS. Performed at [facility], [place] [address] [place], [place] [address]    Report Status 07/29/2017 FINAL  Final  Culture, blood (Routine X 2) w Reflex to ID Panel     Status: None (Preliminary result)   Collection Time: 07/25/17  5:25 PM  Result Value Ref Range Status   Specimen Description BLOOD LEFT ANTECUBITAL  Final   Special Requests   Final    BOTTLES DRAWN AEROBIC AND ANAEROBIC Blood Culture results may not be optimal due to an  excessive volume of blood received in culture bottles   Culture NO GROWTH 4 DAYS  Final   Report Status PENDING  Incomplete  Culture, blood (single) w Reflex to ID Panel     Status: None (Preliminary result)   Collection Time: 07/26/17  4:54 PM  Result Value Ref Range Status   Specimen Description BLOOD LEFT ANTECUBITAL  Final   Special Requests   Final    BOTTLES DRAWN AEROBIC AND ANAEROBIC Blood Culture adequate volume   Culture NO GROWTH 3 DAYS  Final   Report Status PENDING  Incomplete       Today   Subjective:   Shea Evans today stable for discharge/ Objective:   Blood pressure 124/84, pulse 71, temperature 98.4 F (36.9 C), temperature source Oral, resp. rate 14, height _0  (1.727 m), weight 64.4 kg (142 lb), SpO2 100 %.   Intake/Output Summary (Last 24 hours) at 07/29/17 1236 Last data filed at 07/29/17 1100  Gross per 24 hour  Intake             1615 ml  Output             2850 ml  Net            -1235 ml    Exam Awake Alert, Oriented x 3, No new F.N deficits, Normal affect .AT,PERRAL Supple Neck,No JVD, No cervical lymphadenopathy appriciated.  Symmetrical Chest wall movement, Good air movement bilaterally, CTAB RRR,No Gallops,Rubs or new Murmurs, No Parasternal Heave +ve B.Sounds, Abd Soft, Non tender, No organomegaly appriciated, No rebound -guarding or rigidity. No Cyanosis, Clubbing or edema, No new Rash or bruise  Data Review   CBC w Diff: Lab Results  Component Value Date   WBC 8.1 07/27/2017   HGB 12.4 (L) 07/27/2017   HCT 36.3 (L) 07/27/2017   PLT 136 (L) 07/27/2017   LYMPHOPCT 15 07/15/2017   BANDSPCT 0 07/15/2017   MONOPCT 14 07/15/2017   EOSPCT 0 07/15/2017   BASOPCT 0 07/15/2017    CMP: Lab Results  Component Value Date   NA 130 (L) 07/28/2017   K 3.4 (L) 07/28/2017   CL 98 (L) 07/28/2017   CO2 26 07/28/2017   BUN 8 07/28/2017   CREATININE 0.70 07/28/2017   PROT 8.5 (H) 07/15/2017   ALBUMIN 2.8 (L) 07/15/2017   BILITOT  2.1 (H) 07/15/2017   ALKPHOS 137 (H) 07/15/2017   AST 179 (H) 07/15/2017   ALT 126 (H) 07/15/2017  .   Total Time in preparing paper work, data evaluation and todays exam - 78 minutes  Briellah Baik M.D on 07/29/2017 at 12:36 PM    Note: This dictation was prepared with Dragon dictation along with smaller phrase technology. Any transcriptional errors that result from this process are unintentional.

## 2017-07-29 NOTE — Progress Notes (Signed)
Inpatient Diabetes Program Recommendations  AACE/ADA: New Consensus Statement on Inpatient Glycemic Control (2015)  Target Ranges:  Prepandial:   less than 140 mg/dL      Peak postprandial:   less than 180 mg/dL (1-2 hours)      Critically ill patients:  140 - 180 mg/dL   Lab Results  Component Value Date   GLUCAP 163 (H) 07/28/2017   HGBA1C 14.5 (H) 07/24/2017    Review of Glycemic Control   Results for Cameron Coffey, Cameron Coffey (MRN [id number]) as of 07/29/2017 09:35  Ref. Range 07/28/2017 08:25 07/28/2017 11:13 07/28/2017 16:23 07/28/2017 20:37 07/29/2017 08:50  Glucose-Capillary Latest Ref Range: 65 - 99 mg/dL 119 (H) 147 (H) 829 (H) 163 (H) 260 (H)    Diabetes history:DM2 Outpatient Diabetes medications: Glipizide 10 mg BID, Metformin 1000 mg BID  Current orders for Inpatient glycemic control: Lantus 13 units QHS, Novlog 0-20 units TID with meals, Novolog 0-5 units QHS, Glipizide 10 mg BID  Post prandial blood sugars elevated when he does not get Novolog at meals (see noon on 07/28/17- no insulin for breakfast that day) consider adding Novolog 3 units tid and decrease Novolog correction to moderate correction scale 0-15 units tid. D/C hs Novolog insulin and increase Lantus to 14 units (fasting blood sugar elevated)  Susette Racer, RN, [place], [place], CDE Diabetes Coordinator Inpatient Diabetes Program  [phone] (Team Pager) [phone] [facility] Office) 07/29/2017 7:35 AM

## 2017-07-29 NOTE — Clinical Social Work Note (Signed)
Patient to be d/c'ed today to [facility].  Patient and family agreeable to plans will transport via ems RN to call report.  CSW updated patient's sister Cameron Coffey, [phone]  Windell Moulding, MSW, Theresia Majors [phone]

## 2017-07-29 NOTE — Clinical Social Work Note (Signed)
Clinical Social Work Assessment  Patient Details  Name: Cameron Coffey MRN: [id number] Date of Birth: May 17, 1961  Date of referral:  07/28/17               Reason for consult:  Facility Placement                Permission sought to share information with:  Facility Medical sales representative, Family Supports Permission granted to share information::  Yes, Verbal Permission Granted  Name::     Burlene Arnt [phone]  or Robin Searing   [phone]   Agency::  SNF admissions  Relationship::     Contact Information:     Housing/Transportation Living arrangements for the past 2 months:  Single Family Home Source of Information:  Patient Patient Interpreter Needed:  None Criminal Activity/Legal Involvement Pertinent to Current Situation/Hospitalization:  No - Comment as needed Significant Relationships:  Siblings, Friend Lives with:  Relatives Do you feel safe going back to the place where you live?  No Need for family participation in patient care:  No (Coment)  Care giving concerns:  Patient will need some short term rehab and 2-4 weeks IV antibiotics  Social Worker assessment / plan:  Patient is a 56 year old male who is alert and oriented x4. Patient expressed that he was living with his parents, but now he is not staying with them anymore.  Patient has a girlfriend who is in the hospital right now, and he also has a sister who is supportive of his care.  Patient has not been to rehab before, and he will also need IV antibiotics.  Patient does not have insurance anymore, CSW explained to patient that because he does not have any insurance his options will be very limited where he can go.  CSW explained to patient that he may have to go out of county for SNF placement.  Patient expressed understanding.  Patient is hopeful that he will be able to find a place to go after he has finished his IV antibiotics.  Patient was explained role of CSW and process for looking for SNF  placement.  Patient did not have any other questions or concerns.  Employment status:  Unemployed Health and safety inspector:  Self Pay (Medicaid Pending) PT Recommendations:  Skilled Nursing Facility Information / Referral to community resources:  Skilled Nursing Facility  Patient/Family's Response to care:  Patient and family agreeable to going to SNF for IV antibiotics and some therapy. Patient/Family's Understanding of and Emotional Response to Diagnosis, Current Treatment, and Prognosis: Patient expressed he is hopeful he will not have to stay at SNF for very long, but he understands he may have to be there for up to 4 weeks.  Emotional Assessment Appearance:  Appears stated age Attitude/Demeanor/Rapport:    Affect (typically observed):  Appropriate, Calm Orientation:  Oriented to Self, Oriented to Place, Oriented to  Time, Oriented to Situation Alcohol / Substance use:  Not Applicable Psych involvement (Current and /or in the community):  No (Comment)  Discharge Needs  Concerns to be addressed:  Lack of Support Readmission within the last 30 days:  No Current discharge risk:  Lack of support system, Inadequate Financial Supports Barriers to Discharge:  Inadequate or no insurance   Darleene Cleaver, LCSWA 07/29/2017, 5:35 PM

## 2017-07-30 ENCOUNTER — Encounter: Payer: Self-pay | Admitting: Adult Health

## 2017-07-30 ENCOUNTER — Non-Acute Institutional Stay (SKILLED_NURSING_FACILITY): Payer: Self-pay | Admitting: Adult Health

## 2017-07-30 ENCOUNTER — Other Ambulatory Visit: Payer: Self-pay

## 2017-07-30 DIAGNOSIS — E119 Type 2 diabetes mellitus without complications: Secondary | ICD-10-CM | POA: Insufficient documentation

## 2017-07-30 DIAGNOSIS — Z794 Long term (current) use of insulin: Secondary | ICD-10-CM

## 2017-07-30 DIAGNOSIS — E1169 Type 2 diabetes mellitus with other specified complication: Secondary | ICD-10-CM | POA: Insufficient documentation

## 2017-07-30 DIAGNOSIS — E118 Type 2 diabetes mellitus with unspecified complications: Secondary | ICD-10-CM

## 2017-07-30 DIAGNOSIS — N200 Calculus of kidney: Secondary | ICD-10-CM | POA: Insufficient documentation

## 2017-07-30 DIAGNOSIS — E785 Hyperlipidemia, unspecified: Secondary | ICD-10-CM

## 2017-07-30 DIAGNOSIS — I10 Essential (primary) hypertension: Secondary | ICD-10-CM

## 2017-07-30 DIAGNOSIS — A4101 Sepsis due to Methicillin susceptible Staphylococcus aureus: Secondary | ICD-10-CM | POA: Insufficient documentation

## 2017-07-30 DIAGNOSIS — M549 Dorsalgia, unspecified: Secondary | ICD-10-CM | POA: Insufficient documentation

## 2017-07-30 DIAGNOSIS — M545 Low back pain, unspecified: Secondary | ICD-10-CM

## 2017-07-30 DIAGNOSIS — K5909 Other constipation: Secondary | ICD-10-CM

## 2017-07-30 DIAGNOSIS — R222 Localized swelling, mass and lump, trunk: Secondary | ICD-10-CM | POA: Insufficient documentation

## 2017-07-30 LAB — CULTURE, BLOOD (ROUTINE X 2): CULTURE: NO GROWTH

## 2017-07-30 MED ORDER — OXYCODONE HCL 5 MG PO TABS: 5.0000 mg | ORAL_TABLET | ORAL | 0 refills | Status: DC | PRN

## 2017-07-30 NOTE — Telephone Encounter (Signed)
RX faxed to [facility] @ [phone], phone number [phone] 

## 2017-07-30 NOTE — Progress Notes (Signed)
Location:   [place] Room Number: [place] of Service:  SNF (31)   CODE STATUS: dnr  No Known Allergies  Chief Complaint  Patient presents with  . Hospitalization Follow-up    Hospital Follow up    HPI:  He is a 56 year old with a history of diabetes with a hgb a1c of 12; dyslipidemia; and noncompliance with his medications. He was admitted to this hospital with multiple issues: sepsis; UTI; prostate abscess; pneumonia and poorly controlled diabetes. He had a cardiology; urology and I/D consults. He has swelling on his left upper back which was found on 07-27-17. Staff today reports that the swollen area is far worse today from yesterday; he does tell me that the area is tender. He is complaining of back pain; shoulder pain; and generalized pain. He is here for short term rehab with his goal to return back home. I have spoken with his sister Helene Kelp.    Past Medical History:  Diagnosis Date  . Diabetes mellitus without complication [facility])     Past Surgical History:  Procedure Laterality Date  . none    . TRANSURETHRAL RESECTION OF PROSTATE N/A 07/26/2017   Procedure: TRANSURETHRAL RESECTION OF THE PROSTATE (TURP) WITH UPROOFING OF PROSTATE ABSCESS;  Surgeon: Irine Seal, MD;  Location: [facility] ORS;  Service: Urology;  Laterality: N/A;    Social History   Social History  . Marital status: Divorced    Spouse name: N/A  . Number of children: 3  . Years of education: N/A   Occupational History  . Truck Geophysicist/field seismologist    Social History Main Topics  . Smoking status: Former Smoker    Packs/day: 0.50    Years: 10.00    Types: Cigarettes  . Smokeless tobacco: Never Used  . Alcohol use 0.0 oz/week     Comment: weekend beer drinker  . Drug use: No  . Sexual activity: No   Other Topics Concern  . Not on file   Social History Narrative   Lives with mom   Family History  Problem Relation Age of Onset  . Hyperlipidemia Mother   . Diabetes Mother   .  Hyperlipidemia Father   . Diabetes Father       VITAL SIGNS BP (!) 168/89   Pulse 86   Temp 98 F (36.7 C)   Resp 18   Ht 5' 8"  (1.727 m)   Wt 166 lb 12.8 oz (75.7 kg)   BMI 25.36 kg/m    Patient's Medications  New Prescriptions   No medications on file  Previous Medications   CEFAZOLIN (ANCEF) IVPB    Inject 1 g into the vein every 8 (eight) hours. Indication:  MSSA bacteremia Last Day of Therapy: october 29th Labs - Once weekly:  CBC/D and BMP, Labs - Every other week:  ESR and CRP   GLIPIZIDE (GLUCOTROL) 10 MG TABLET    Take 1 tablet (10 mg total) by mouth 2 (two) times daily before a meal.   INSULIN ASPART (NOVOLOG FLEXPEN) 100 UNIT/ML FLEXPEN    Inject 5 units subcutaneously before meals and and bedtime for DM CBG > or = 150.  Notify MD for CBG < 70 or > 500   INSULIN GLARGINE (LANTUS) 100 UNIT/ML INJECTION    Inject 0.14 mLs (14 Units total) into the skin at bedtime.   LISINOPRIL (PRINIVIL,ZESTRIL) 5 MG TABLET    Take 1 tablet (5 mg total) by mouth daily.   METFORMIN (GLUCOPHAGE) 1000 MG TABLET  Take 1 tablet (1,000 mg total) by mouth 2 (two) times daily.   METOPROLOL TARTRATE (LOPRESSOR) 25 MG TABLET    Take 0.5 tablets (12.5 mg total) by mouth 2 (two) times daily.   SIMVASTATIN (ZOCOR) 40 MG TABLET    Take 1 tablet (40 mg total) by mouth daily.  Modified Medications   No medications on file  Discontinued Medications   INSULIN ASPART (NOVOLOG) 100 UNIT/ML INJECTION    Inject 0-15 Units into the skin 3 (three) times daily with meals.   INSULIN ASPART (NOVOLOG) 100 UNIT/ML INJECTION    Inject 0-5 Units into the skin at bedtime.   INSULIN ASPART (NOVOLOG) 100 UNIT/ML INJECTION    Inject 3 Units into the skin 3 (three) times daily with meals.     SIGNIFICANT DIAGNOSTIC EXAMS  TODAY:   07-24-17: chest x-ray: No acute abnormality. No evidence of pneumonia.  07-25-17: pelvic ultrasound: 1. Possible posterior bladder wall mass, for example image 13, transitional cell  carcinoma is not excluded and cystoscopy is recommended. 2. Moderate enlargement the prostate gland at 69 cubic cm.  07-25-17: ct of abdomen and pelvis: 1. Organized fluid collections within the prostate and soft tissue musculature, highly worrisome for abscesses. 2. Striated appearance of both kidneys, most indicative of pyelonephritis. Difficult to exclude developing abscesses. 3. Debris in the bladder.  Difficult to exclude a mass. 4. Right middle lobe nodule. Metastatic disease is not excluded. Continued attention on follow-up is recommended. 5. Cirrhosis with moderate ascites. 6. Small left pleural effusion with collapse/consolidation in the left lower lobe. 7. Bilateral renal stones. 8. Cholelithiasis. 9.  Aortic atherosclerosis   07-27-17: ct of chest: 1. Moderate size left pleural effusion. Small amount air may be within this fluid collection and continued followup is recommended. 2. Opacity posteriorly in the left lower lobe consistent with atelectasis or pneumonia. 3. 8 mm pleural-based opacity in the anterior right middle lobe. Cannot exclude neoplasm. Recommend CT of the chest is followup in 4-6 months. 4. Possible hepatic lesion anteriorly in the left lobe. Consider ultrasound or preferably MRI to assess further. Ascites in the upper abdomen. 5. Aberrant right subclavian artery.  LABS REVIEWED: TODAY:   07-24-17: wbc 17.9; hgb 15.4; hct 44.8; mcv 92.9; plt 195; glucose 498; bun 10; creat 0.95; k+ 5.6; na++123; ca 8.2; BC MSSA; HIV: nr; hgb a1c 14.5 07-25-17: wbc 15.4; hgb 14.5; hct 42.7; mcv 92.9; plt 129; glucose 83; bun 12; creat 0.61; k+ 3.1; na++ 129; ca 7.98: urine culture: MSSA 07-27-17: wbc 8.1; hgb 12.4; hct 26.2; mcv 93.2; plt 136;  07-28-17: glucose 231; bun 8; creat 0.70; k+ 3.4; n++ 130; ca 7.3   Review of Systems  Constitutional: Negative for malaise/fatigue.  Respiratory: Negative for cough and shortness of breath.   Cardiovascular: Negative for chest pain,  palpitations and leg swelling.  Gastrointestinal: Positive for constipation. Negative for abdominal pain and heartburn.  Genitourinary:       Has foley  Musculoskeletal: Positive for back pain, joint pain and myalgias.  Skin: Negative.        Has lump on left upper back   Neurological: Negative for dizziness.  Psychiatric/Behavioral: The patient is nervous/anxious and has insomnia.     Physical Exam  Constitutional: He is oriented to person, place, and time. No distress.  thin  Eyes: Conjunctivae are normal.  Neck: Neck supple. No JVD present. No thyromegaly present.  Cardiovascular: Normal rate, regular rhythm and intact distal pulses.   Respiratory: Effort normal and breath sounds  normal. No respiratory distress. He has no wheezes.  GI: Soft. Bowel sounds are normal. He exhibits no distension. There is no tenderness.  Genitourinary:  Genitourinary Comments: Foley   Musculoskeletal: He exhibits no edema.  Able to move all extremities   Lymphadenopathy:    He has no cervical adenopathy.  Neurological: He is alert and oriented to person, place, and time.  Skin: Skin is warm and dry. He is not diaphoretic.  Left upper back near scapula: large firm mass fist size. Slightly tender to touch.   Psychiatric:  Is anxious and restless      ASSESSMENT/ PLAN:  1. Hypertension: worse b/p 168/89: will continue lopressor 12.5 mg twice daily and will increase lisinopril to 10 mg daily and will monitor   2. Dyslipidemia: no change in status; will continue zocor 40 mg daily   3. Diabetes: worse hgb a1c 14,5   l will increase lantus to 20 units; increase novolog to 10 units after meals will monitor  4. Sepsis: prostate abscess; pneumonia: is on ancef 1 gm every 8 hours through 09-06-17: with cbc; with diff weekly and esr and crp every other week.     5. Has new mass on left upper back: is growing I have spoken with Dr. Eulas Post: will begin him on Vancomycin and will have surgery consult.  As  he has MSSA: will begin vancomycin 375 mg IV every 8 hours with pharmacy to dose   6. Back pain; and bilateral shoulder pain: will begin oxycodone 5 mg every 4 hours as needed for the next 5 days and will monitor his status.   7. Constipation; is worse: will begin miralax 17 gm daily   8. Bilateral renal stone: without change: will monitor   9.  Cirrhosis: without change: will get cmp and will monitor     MD is aware of resident's narcotic use and is in agreement with current plan of care. We will attempt to wean resident as apropriate   Ok Edwards NP [facility]  Contact [phone] Monday through Friday 8am- 5pm  After hours call [phone]

## 2017-07-31 LAB — BASIC METABOLIC PANEL
BUN: 5 (ref 4–21)
Creatinine: 0.5 — AB (ref 0.6–1.3)
Glucose: 154
POTASSIUM: 3.9 (ref 3.4–5.3)
SODIUM: 134 — AB (ref 137–147)

## 2017-07-31 LAB — CULTURE, BLOOD (SINGLE)
CULTURE: NO GROWTH
SPECIAL REQUESTS: ADEQUATE

## 2017-07-31 LAB — HEPATIC FUNCTION PANEL
ALT: 60 — AB (ref 10–40)
AST: 130 — AB (ref 14–40)
Alkaline Phosphatase: 183 — AB (ref 25–125)
Bilirubin, Total: 1.8

## 2017-08-02 ENCOUNTER — Non-Acute Institutional Stay (SKILLED_NURSING_FACILITY): Payer: Self-pay | Admitting: Internal Medicine

## 2017-08-02 ENCOUNTER — Encounter: Payer: Self-pay | Admitting: Internal Medicine

## 2017-08-02 ENCOUNTER — Other Ambulatory Visit: Payer: Self-pay

## 2017-08-02 DIAGNOSIS — F1011 Alcohol abuse, in remission: Secondary | ICD-10-CM

## 2017-08-02 DIAGNOSIS — K5909 Other constipation: Secondary | ICD-10-CM

## 2017-08-02 DIAGNOSIS — R222 Localized swelling, mass and lump, trunk: Secondary | ICD-10-CM

## 2017-08-02 DIAGNOSIS — E785 Hyperlipidemia, unspecified: Secondary | ICD-10-CM

## 2017-08-02 DIAGNOSIS — Z794 Long term (current) use of insulin: Secondary | ICD-10-CM

## 2017-08-02 DIAGNOSIS — Z87898 Personal history of other specified conditions: Secondary | ICD-10-CM

## 2017-08-02 DIAGNOSIS — R600 Localized edema: Secondary | ICD-10-CM

## 2017-08-02 DIAGNOSIS — N412 Abscess of prostate: Secondary | ICD-10-CM

## 2017-08-02 DIAGNOSIS — K7031 Alcoholic cirrhosis of liver with ascites: Secondary | ICD-10-CM

## 2017-08-02 DIAGNOSIS — IMO0001 Reserved for inherently not codable concepts without codable children: Secondary | ICD-10-CM

## 2017-08-02 DIAGNOSIS — R911 Solitary pulmonary nodule: Secondary | ICD-10-CM

## 2017-08-02 DIAGNOSIS — A4101 Sepsis due to Methicillin susceptible Staphylococcus aureus: Secondary | ICD-10-CM

## 2017-08-02 DIAGNOSIS — E1169 Type 2 diabetes mellitus with other specified complication: Secondary | ICD-10-CM

## 2017-08-02 DIAGNOSIS — E118 Type 2 diabetes mellitus with unspecified complications: Secondary | ICD-10-CM

## 2017-08-02 MED ORDER — OXYCODONE HCL 5 MG PO TABS: 5.0000 mg | ORAL_TABLET | ORAL | 0 refills | Status: DC | PRN

## 2017-08-02 NOTE — Progress Notes (Signed)
Patient ID: Cameron Coffey, male   DOB: Dec 20, 1960, 56 y.o.   MRN: [id number]     HISTORY AND PHYSICAL   DATE:   August 02, 2017  Location:   [facility] Room Number: [place] of Service: SNF (31)   Extended Emergency Contact Information Primary Emergency Contact: [place] Phone: [phone] Relation: Sister Secondary Emergency Contact: Rito Ehrlich [place] Mobile Phone: [phone] Relation: Friend  Advanced Directive information Does Patient Have a Medical Advance Directive?: No, Would patient like information on creating a medical advance directive?: No - Patient declined, Type of Advance Directive: Out of facility DNR (pink MOST or yellow form), Pre-existing out of facility DNR order (yellow form or pink MOST form): Yellow form placed in chart (order not valid for inpatient use), Does patient want to make changes to medical advance directive?: No - Patient declined  Chief Complaint  Patient presents with  . New Admit To SNF    Admission    HPI:  56 yo male seen today as a new admission into SNF following hospital stay for UTI wo hematuria, sepsis, hyperglycemia, DM, hyperlipidemia, elevated Troponin 2/2 demand ischemia from sepsis, LLL pneumonia with moderate pleural effusion and possible RML opacity, electrolyte derangement. He was seen in the ED 07/15/17 with hyperglycemia and dehydration prior to this hospital admission. He presented to the ED on 07/24/17 with high fever, generalized weakness. UA (+) and he was started on IV vanco/zosyn and INF. Elevated lactic acid level noted. BC (+) MSSA. UC (+) MSSA. IV zosyn -->ancef. PICC line inserted and he will require IV abx through 09/06/17. CT abd/pelvis revealed fluid collection in prostate worrisome for abscess. Urology Dr Jeffie Pollock consulted and he underwent TURP with uproofing of prostate abscess on 07/26/17. Foley cath inserted with plan to leave for 2 weeks and  f/u with urology. Insulin started due to A1c 12%. 2D echo revealed EF 50% but no vegetations. CT chest showed moderate pleural effusion, LLL opacity, RML 39m pleural based opacity; left lobe hepatic lesion with ascites in upper abdomen. Recommended to repeat CT chest in 4 mos to follow. Na 123-->130; K 5.6-->3.4; Cr 0.7; WBC 17.9K-->8.1K; hgb 12.4; LFTs on 07/15/17 revealed albumin 2.8, AST 179, ALT 126, Tbili 2.1.   Today he reports pain at left scapular mass. Pain interrupts sleep. He has memory loss and is a poor historian. Hx obtained from chart. No nursing issues. No falls. Appetite reduced. No f/c. CBGs 210s. IV vanco started on 07/30/17 due to area on scapula.  Hypertension - improved on lopressor 12.5 mg twice daily; lisinopril 10 mg daily   Dyslipidemia - stable on zocor 40 mg daily   DM - uncontrolled. A1c 12%. CBG 210s. Takes lantus 20 units; novolog 10 units after meals. No low BS reactions. Takes statin and ACEI  Left scapular mass/Back pain - uncontrolled pain. He takes oxycodone 5 mg every 4 hours as needed   Constipation - stable on miralax 17 gm daily   Bilateral renal stone/ BPH - unchanged. He has foley cath due to obstruction/prostate enlargement s/p TURP  Cirrhosis/hx Etoh abuse - elevated LFTs. No jaundice  Past Medical History:  Diagnosis Date  . Diabetes mellitus without complication ([facility]     Past Surgical History:  Procedure Laterality Date  . none    . TRANSURETHRAL RESECTION OF PROSTATE N/A 07/26/2017   Procedure: TRANSURETHRAL RESECTION OF THE PROSTATE (TURP) WITH UPROOFING OF PROSTATE ABSCESS;  Surgeon: WIrine Seal  MD;  Location: [facility] ORS;  Service: Urology;  Laterality: N/A;    Patient Care Team: Jearld Fenton, NP as PCP - General (Internal Medicine)  Social History   Social History  . Marital status: Divorced    Spouse name: N/A  . Number of children: 3  . Years of education: N/A   Occupational History  . Truck Geophysicist/field seismologist    Social History Main  Topics  . Smoking status: Former Smoker    Packs/day: 0.50    Years: 10.00    Types: Cigarettes  . Smokeless tobacco: Never Used  . Alcohol use 0.0 oz/week     Comment: weekend beer drinker  . Drug use: No  . Sexual activity: No   Other Topics Concern  . Not on file   Social History Narrative   Lives with mom     reports that he has quit smoking. His smoking use included Cigarettes. He has a 5.00 pack-year smoking history. He has never used smokeless tobacco. He reports that he drinks alcohol. He reports that he does not use drugs.  Family History  Problem Relation Age of Onset  . Hyperlipidemia Mother   . Diabetes Mother   . Hyperlipidemia Father   . Diabetes Father    Family Status  Relation Status  . Mother Alive  . Father Alive    Immunization History  Administered Date(s) Administered  . Influenza,inj,Quad PF,6+ Mos 07/26/2017  . PPD Test 07/30/2017  . Pneumococcal Polysaccharide-23 07/26/2017    No Known Allergies  Medications: Patient's Medications  New Prescriptions   No medications on file  Previous Medications   CEFAZOLIN (ANCEF) IVPB    Inject 1 g into the vein every 8 (eight) hours. Indication:  MSSA bacteremia Last Day of Therapy: october 29th Labs - Once weekly:  CBC/D and BMP, Labs - Every other week:  ESR and CRP   GLIPIZIDE (GLUCOTROL) 10 MG TABLET    Take 1 tablet (10 mg total) by mouth 2 (two) times daily before a meal.   INSULIN ASPART (NOVOLOG FLEXPEN) 100 UNIT/ML FLEXPEN    Inject 5 units subcutaneously before meals and and bedtime for DM CBG > or = 150.  Notify MD for CBG < 70 or > 500   INSULIN GLARGINE (LANTUS) 100 UNIT/ML INJECTION    Inject 0.14 mLs (14 Units total) into the skin at bedtime.   LISINOPRIL (PRINIVIL,ZESTRIL) 5 MG TABLET    Take 1 tablet (5 mg total) by mouth daily.   METFORMIN (GLUCOPHAGE) 1000 MG TABLET    Take 1 tablet (1,000 mg total) by mouth 2 (two) times daily.   METOPROLOL TARTRATE (LOPRESSOR) 25 MG TABLET     Take 0.5 tablets (12.5 mg total) by mouth 2 (two) times daily.   SIMVASTATIN (ZOCOR) 40 MG TABLET    Take 1 tablet (40 mg total) by mouth daily.  Modified Medications   Modified Medication Previous Medication   OXYCODONE (ROXICODONE) 5 MG IMMEDIATE RELEASE TABLET oxyCODONE (ROXICODONE) 5 MG immediate release tablet      Take 1 tablet (5 mg total) by mouth every 4 (four) hours as needed for severe pain.    Take 1 tablet (5 mg total) by mouth every 4 (four) hours as needed for severe pain.  Discontinued Medications   No medications on file    Review of Systems  Unable to perform ROS: Other (memory loss)    Vitals:   08/02/17 0755  BP: 128/86  Pulse: 86  Resp: 18  Temp:  98 F (36.7 C)  Weight: 166 lb 12.8 oz (75.7 kg)  Height: 5' 8"  (1.727 m)   Body mass index is 25.36 kg/m.  Physical Exam  Constitutional: He appears well-developed and well-nourished.  HENT:  Mouth/Throat: Oropharynx is clear and moist.  MMM; no oral thrush  Eyes: Pupils are equal, round, and reactive to light. No scleral icterus.  Neck: Neck supple. Carotid bruit is not present.  Cardiovascular: Normal rate, regular rhythm and intact distal pulses.  Exam reveals no gallop and no friction rub.   Murmur (1/6 SEM) heard. +1 pitting LE edema b/l. No calf TTP. Right arm PICC line intact with no redness or d/c at insertion site  Pulmonary/Chest: Effort normal. He has no wheezes. He has rales (left base). He exhibits no tenderness.  Abdominal: Soft. Normal appearance and bowel sounds are normal. He exhibits distension and ascites. He exhibits no abdominal bruit, no pulsatile midline mass and no mass. There is no hepatosplenomegaly or hepatomegaly. There is tenderness. There is no rigidity, no rebound and no guarding. No hernia.  Musculoskeletal: He exhibits edema and tenderness.  Lymphadenopathy:    He has no cervical adenopathy.  Neurological: He is alert.  Skin: Skin is warm and dry. No rash noted.       Psychiatric: He has a normal mood and affect. His behavior is normal. Thought content normal.     Labs reviewed: Admission on 07/24/2017, Discharged on 07/29/2017  No results displayed because visit has over 200 results.  CBC Latest Ref Rng & Units 07/27/2017 07/25/2017 07/24/2017  WBC 3.8 - 10.6 K/uL 8.1 15.4(H) 17.9(H)  Hemoglobin 13.0 - 18.0 g/dL 12.4(L) 14.5 15.4  Hematocrit 40.0 - 52.0 % 36.3(L) 42.4 44.8  Platelets 150 - 440 K/uL 136(L) 153 195   . CMP Latest Ref Rng & Units 07/31/2017 07/28/2017 07/27/2017  Glucose 65 - 99 mg/dL - 231(H) -  BUN 4 - 21 5 8 11   Creatinine 0.6 - 1.3 0.5(A) 0.70 0.63  Sodium 137 - 147 134(A) 130(L) -  Potassium 3.4 - 5.3 3.9 3.4(L) -  Chloride 101 - 111 mmol/L - 98(L) -  CO2 22 - 32 mmol/L - 26 -  Calcium 8.9 - 10.3 mg/dL - 7.3(L) -  Total Protein 6.5 - 8.1 g/dL - - -  Total Bilirubin 0.3 - 1.2 mg/dL - - -  Alkaline Phos 25 - 125 183(A) - -  AST 14 - 40 130(A) - -  ALT 10 - 40 60(A) - -   Lipid Panel     Component Value Date/Time   CHOL 72 07/25/2017 0308   TRIG 79 07/25/2017 0308   HDL <10 (L) 07/25/2017 0308   CHOLHDL NOT CALCULATED 07/25/2017 0308   VLDL 16 07/25/2017 0308   LDLCALC 102 (H) 08/14/2016 1411   Lab Results  Component Value Date   HGBA1C 14.5 (H) 07/24/2017     Admission on 07/15/2017, Discharged on 07/15/2017  Component Date Value Ref Range Status  . Sodium 07/15/2017 120* 135 - 145 mmol/L Final  . Potassium 07/15/2017 5.1  3.5 - 5.1 mmol/L Final  . Chloride 07/15/2017 83* 101 - 111 mmol/L Final  . CO2 07/15/2017 24  22 - 32 mmol/L Final  . Glucose, Bld 07/15/2017 802* 65 - 99 mg/dL Final   Comment: CRITICAL RESULT CALLED TO, READ BACK BY AND VERIFIED WITH LYNDA MCLAMB ON 07/15/17 AT 1347 BY JAG   . BUN 07/15/2017 13  6 - 20 mg/dL Final  . Creatinine, Ser 07/15/2017  0.94  0.61 - 1.24 mg/dL Final  . Calcium 07/15/2017 9.1  8.9 - 10.3 mg/dL Final  . Total Protein 07/15/2017 8.5* 6.5 - 8.1 g/dL Final  . Albumin  07/15/2017 2.8* 3.5 - 5.0 g/dL Final  . AST 07/15/2017 179* 15 - 41 U/L Final  . ALT 07/15/2017 126* 17 - 63 U/L Final  . Alkaline Phosphatase 07/15/2017 137* 38 - 126 U/L Final  . Total Bilirubin 07/15/2017 2.1* 0.3 - 1.2 mg/dL Final  . GFR calc non Af Amer 07/15/2017 >60  >60 mL/min Final  . GFR calc Af Amer 07/15/2017 >60  >60 mL/min Final   Comment: (NOTE) The eGFR has been calculated using the CKD EPI equation. This calculation has not been validated in all clinical situations. eGFR's persistently <60 mL/min signify possible Chronic Kidney Disease.   . Anion gap 07/15/2017 13  5 - 15 Final  . WBC 07/15/2017 7.0  3.8 - 10.6 K/uL Final  . RBC 07/15/2017 4.99  4.40 - 5.90 MIL/uL Final  . Hemoglobin 07/15/2017 15.5  13.0 - 18.0 g/dL Final  . HCT 07/15/2017 46.8  40.0 - 52.0 % Final  . MCV 07/15/2017 93.9  80.0 - 100.0 fL Final  . MCH 07/15/2017 31.1  26.0 - 34.0 pg Final  . MCHC 07/15/2017 33.2  32.0 - 36.0 g/dL Final  . RDW 07/15/2017 13.9  11.5 - 14.5 % Final  . Platelets 07/15/2017 101* 150 - 440 K/uL Final  . Neutrophils Relative % 07/15/2017 71  % Final  . Lymphocytes Relative 07/15/2017 15  % Final  . Monocytes Relative 07/15/2017 14  % Final  . Eosinophils Relative 07/15/2017 0  % Final  . Basophils Relative 07/15/2017 0  % Final  . Band Neutrophils 07/15/2017 0  % Final  . Metamyelocytes Relative 07/15/2017 0  % Final  . Myelocytes 07/15/2017 0  % Final  . Promyelocytes Absolute 07/15/2017 0  % Final  . Blasts 07/15/2017 0  % Final  . nRBC 07/15/2017 0  0 /100 WBC Final  . Other 07/15/2017 0  % Final  . Neutro Abs 07/15/2017 4.9  1.4 - 6.5 K/uL Final  . Lymphs Abs 07/15/2017 1.1  1.0 - 3.6 K/uL Final  . Monocytes Absolute 07/15/2017 1.0  0.2 - 1.0 K/uL Final  . Eosinophils Absolute 07/15/2017 0.0  0 - 0.7 K/uL Final  . Basophils Absolute 07/15/2017 0.0  0 - 0.1 K/uL Final  . Smear Review 07/15/2017 PLATELET CLUMPS NOTED ON SMEAR, COUNT APPEARS ADEQUATE   Final  .  Osmolality 07/15/2017 304* 275 - 295 mOsm/kg Final  . pH, Ven 07/15/2017 7.43  7.250 - 7.430 Final  . pCO2, Ven 07/15/2017 40* 44.0 - 60.0 mmHg Final  . pO2, Ven 07/15/2017 67.0* 32.0 - 45.0 mmHg Final  . Bicarbonate 07/15/2017 26.5  20.0 - 28.0 mmol/L Final  . Acid-Base Excess 07/15/2017 2.0  0.0 - 2.0 mmol/L Final  . O2 Saturation 07/15/2017 93.6  % Final  . Patient temperature 07/15/2017 37.0   Final  . Collection site 07/15/2017 VEIN   Final  . Sample type 07/15/2017 VEIN   Final  . Color, Urine 07/15/2017 STRAW* YELLOW Final  . APPearance 07/15/2017 CLEAR* CLEAR Final  . Specific Gravity, Urine 07/15/2017 1.028  1.005 - 1.030 Final  . pH 07/15/2017 6.0  5.0 - 8.0 Final  . Glucose, UA 07/15/2017 >=500* NEGATIVE mg/dL Final  . Hgb urine dipstick 07/15/2017 SMALL* NEGATIVE Final  . Bilirubin Urine 07/15/2017 NEGATIVE  NEGATIVE Final  .  Ketones, ur 07/15/2017 20* NEGATIVE mg/dL Final  . Protein, ur 07/15/2017 NEGATIVE  NEGATIVE mg/dL Final  . Nitrite 07/15/2017 NEGATIVE  NEGATIVE Final  . Leukocytes, UA 07/15/2017 TRACE* NEGATIVE Final  . RBC / HPF 07/15/2017 0-5  0 - 5 RBC/hpf Final  . WBC, UA 07/15/2017 TOO NUMEROUS TO COUNT  0 - 5 WBC/hpf Final  . Bacteria, UA 07/15/2017 NONE SEEN  NONE SEEN Final  . Squamous Epithelial / LPF 07/15/2017 0-5* NONE SEEN Final  . Mucus 07/15/2017 PRESENT   Final  . Glucose-Capillary 07/15/2017 568* 65 - 99 mg/dL Final  . Glucose-Capillary 07/15/2017 463* 65 - 99 mg/dL Final    Ct Abdomen Pelvis W Wo Contrast  Result Date: 07/26/2017 CLINICAL DATA:  Hematuria, weight loss, abdominal pain. Urinary tract infection. EXAM: CT ABDOMEN AND PELVIS WITHOUT AND WITH CONTRAST TECHNIQUE: Multidetector CT imaging of the abdomen and pelvis was performed following the standard protocol before and following the bolus administration of intravenous contrast. CONTRAST:  170m ISOVUE-300 IOPAMIDOL (ISOVUE-300) INJECTION 61% COMPARISON:  None. FINDINGS: Lower chest: 8 mm  nodule seen in the right middle lobe. Image quality is degraded by respiratory motion. Small left pleural effusion collapse/ consolidation in the left lower lobe. Heart size normal. No pericardial effusion. Distal esophagus is grossly unremarkable. Hepatobiliary: Liver margin is markedly irregular. Stones are seen in the gallbladder. No biliary ductal dilatation. Pancreas: Negative. Spleen: Negative. Adrenals/Urinary Tract: Adrenal glands are unremarkable. Stones are seen in the kidneys bilaterally. There is a striated appearance in both kidneys with areas of ill-defined low attenuation on portal venous phase imaging. 1.3 cm low-attenuation lesion is seen in the interpolar left kidney (series 8, image 31), too small to characterize. On delayed imaging there is low attenuation material in the nondependent portion of the bladder. Stomach/Bowel: Stomach, small bowel, appendix and colon are grossly unremarkable. Vascular/Lymphatic: Atherosclerotic calcification of the arterial vasculature without abdominal aortic aneurysm. Borderline enlarged porta hepatis lymph nodes, likely reactive. Reproductive: Prostate is enlarged and contains a large area of low-attenuation, measuring approximately 3.9 x 4.1 cm. Other: Moderate ascites.  Mild presacral edema. Musculoskeletal: Diffuse body wall edema. There are multiple fluid collections within the chest wall musculature, some of which are best seen on nephrographic phase imaging and some of which have peripheral high attenuation on portal venous phase imaging. Index collection in the left lateral chest wall measures 3.3 cm (incompletely imaged, series 5, image 1). A 1.3 cm low-attenuation collection with peripheral high attenuation is seen in the high right thigh musculature (series 5, image 76). No worrisome lytic or sclerotic lesions. IMPRESSION: 1. Organized fluid collections within the prostate and soft tissue musculature, highly worrisome for abscesses. 2. Striated  appearance of both kidneys, most indicative of pyelonephritis. Difficult to exclude developing abscesses. 3. Debris in the bladder.  Difficult to exclude a mass. 4. Right middle lobe nodule. Metastatic disease is not excluded. Continued attention on follow-up is recommended. 5. Cirrhosis with moderate ascites. 6. Small left pleural effusion with collapse/consolidation in the left lower lobe. 7. Bilateral renal stones. 8. Cholelithiasis. 9.  Aortic atherosclerosis (ICD10-170.0). Electronically Signed   By: MLorin PicketM.D.   On: 07/26/2017 08:03   Dg Chest 2 View  Result Date: 07/15/2017 CLINICAL DATA:  Weight loss.  Diabetes. EXAM: CHEST  2 VIEW COMPARISON:  None. FINDINGS: The heart size and mediastinal contours are within normal limits. Both lungs are clear. The visualized skeletal structures are unremarkable. IMPRESSION: No active cardiopulmonary disease. Electronically Signed   By: WThayer Jew  Janeece Fitting M.D.   On: 07/15/2017 13:42   Ct Chest W Contrast  Result Date: 07/27/2017 CLINICAL DATA:  History of pneumonia, followup, former smoking history EXAM: CT CHEST WITH CONTRAST TECHNIQUE: Multidetector CT imaging of the chest was performed during intravenous contrast administration. CONTRAST:  42m ISOVUE-300 IOPAMIDOL (ISOVUE-300) INJECTION 61% COMPARISON:  Chest x-ray of 07/24/2017 FINDINGS: Cardiovascular: Variation of an aberrant right subclavian artery is noted coursing posterior to the esophagus. The thoracic aorta is well opacified with no acute abnormality noted. Mild thoracic aortic atherosclerotic change present. The pulmonary arteries are not as well opacified but no abnormality is seen. There are coronary artery calcifications primarily in the distribution of the left anterior descending. The heart is within upper limits of normal. No pericardial effusion is seen. The mid ascending thoracic aorta measures 31 mm in diameter. Mediastinum/Nodes: No mediastinal or hilar adenopathy is seen the  thyroid gland is normal in size. Lungs/Pleura: On lung window images, there is a moderate size left pleural effusion present. There may be a small amount air within this collection of fluid posterior, and followup is recommended. Opacity posteriorly in the left lower lobe is most consistent with atelectasis or pneumonia. A few air bronchograms are noted through this area of dense partial consolidation. Mild atelectasis is noted posteriorly at the right lung base. There is a pleural-based nodular opacity anteriorly in the right middle lobe which on axial image 85 series 3 measures 8 mm, being slightly more prominent on sagittal images. A developing lung neoplasm cannot be excluded and continued followup is recommended in 4-6 months by CT the chest. Upper Abdomen: There does appear to be ascites within the upper abdomen. There is protrusion of the anterior margin of the left lobe of liver as on image 130 series 2 and a hepatic mass cannot be excluded. Abdominal ultrasound may be helpful to assess, but MR of the abdomen would be preferable to evaluate this area further. Musculoskeletal: The thoracic vertebrae are in normal alignment with no compression deformity noted. IMPRESSION: 1. Moderate size left pleural effusion. Small amount air may be within this fluid collection and continued followup is recommended. 2. Opacity posteriorly in the left lower lobe consistent with atelectasis or pneumonia. 3. 8 mm pleural-based opacity in the anterior right middle lobe. Cannot exclude neoplasm. Recommend CT of the chest is followup in 4-6 months. 4. Possible hepatic lesion anteriorly in the left lobe. Consider ultrasound or preferably MRI to assess further. Ascites in the upper abdomen. 5. Aberrant right subclavian artery. Electronically Signed   By: PIvar DrapeM.D.   On: 07/27/2017 16:10   UKoreaPelvis Limited (transabdominal Only)  Result Date: 07/25/2017 CLINICAL DATA:  Urinary retention and possible benign prostatic  hypertrophy. EXAM: LIMITED ULTRASOUND OF PELVIS TECHNIQUE: Limited transabdominal ultrasound examination of the pelvis was performed. COMPARISON:  None. FINDINGS: Posterior urinary bladder wall mass greater than expected for the inter your icteric ridge shown for example on image 13, there is a 5.9 by 1.0 by 5.1 cm in thickness. Transitional cell carcinoma is a distinct possibility. The the prostate is moderately enlarged, volume calculated at 69 cubic cm. Calcifications in the prostate parenchyma are observed. Prevoid bladder volume was 146 cubic cm. Postvoid volume was 38 cubic cm. IMPRESSION: 1. Possible posterior bladder wall mass, for example image 13, transitional cell carcinoma is not excluded and cystoscopy is recommended. 2. Moderate enlargement the prostate gland at 69 cubic cm. Electronically Signed   By: WVan ClinesM.D.   On: 07/25/2017 12:19  Dg Chest Port 1 View  Result Date: 07/24/2017 CLINICAL DATA:  Sepsis, hypoxia, rule out pneumonia. EXAM: PORTABLE CHEST 1 VIEW COMPARISON:  Chest radiograph 07/15/2017 FINDINGS: The cardiomediastinal contours are normal. The lungs are clear. Pulmonary vasculature is normal. No consolidation, pleural effusion, or pneumothorax. No acute osseous abnormalities are seen. IMPRESSION: No acute abnormality.  No evidence of pneumonia. Electronically Signed   By: Jeb Levering M.D.   On: 07/24/2017 05:03     Assessment/Plan   ICD-10-CM   1. Bilateral lower extremity edema R60.0   2. Type 2 diabetes mellitus with complication, with long-term current use of insulin (HCC) E11.8    Z79.4   3. Mass on back - cyst vs abscess? R22.2   4. Alcoholic cirrhosis of liver with ascites ([place]) K70.31   5. History of ETOH abuse Z87.898   6. Lung nodule < 6cm on CT R91.1   7. Constipation, chronic K59.09   8. Hyperlipidemia associated with type 2 diabetes mellitus (HCC) E11.69    E78.5   9. MSSA (methicillin susceptible Staphylococcus aureus) septicemia (HCC)  A41.01   10. Abscess of prostate N41.2    s/p TURP with uproofing of abscess 07/26/17    start aldactone 4m po daily for edema and ascites  Increase lantus 24 units subcut qHS  Cont other meds as ordered. IV ancef through 09/06/17. He is also receiving IV vanco (dosed by pharmacy)  Apply warm compress to left scapula lump qshift x 163m x 5 days. Surgery referral pending  PT/OT/ST as ordered  Wound care as ordered  Foley cath care as indicated  F/u with urology as scheduled next week  cmp and cbc pending  Nutritional supplements as indicated  GOAL: short term rehab and d/c home when medically appropriate. Communicated with pt and nursing.  Will follow  Vada Yellen S. [facility] [address] [id number] (Monday-Friday 8 AM - 5 PM) [phone] 5 PM and follow prompts

## 2017-08-02 NOTE — Telephone Encounter (Signed)
RX faxed to [facility] @ [phone], phone number [phone] 

## 2017-08-04 ENCOUNTER — Inpatient Hospital Stay (HOSPITAL_COMMUNITY)
Admission: EM | Admit: 2017-08-04 | Discharge: 2017-08-13 | DRG: 603 | Disposition: A | Discharge status: Skilled Nursing Facility | Payer: Self-pay | Attending: Family Medicine | Admitting: Family Medicine

## 2017-08-04 ENCOUNTER — Encounter (HOSPITAL_COMMUNITY): Payer: Self-pay | Admitting: Radiology

## 2017-08-04 ENCOUNTER — Inpatient Hospital Stay (HOSPITAL_COMMUNITY): Payer: Self-pay

## 2017-08-04 ENCOUNTER — Emergency Department (HOSPITAL_COMMUNITY): Payer: Self-pay

## 2017-08-04 DIAGNOSIS — E119 Type 2 diabetes mellitus without complications: Secondary | ICD-10-CM

## 2017-08-04 DIAGNOSIS — J449 Chronic obstructive pulmonary disease, unspecified: Secondary | ICD-10-CM | POA: Diagnosis present

## 2017-08-04 DIAGNOSIS — R222 Localized swelling, mass and lump, trunk: Secondary | ICD-10-CM | POA: Diagnosis present

## 2017-08-04 DIAGNOSIS — E1122 Type 2 diabetes mellitus with diabetic chronic kidney disease: Secondary | ICD-10-CM | POA: Diagnosis present

## 2017-08-04 DIAGNOSIS — L0291 Cutaneous abscess, unspecified: Secondary | ICD-10-CM

## 2017-08-04 DIAGNOSIS — K5909 Other constipation: Secondary | ICD-10-CM | POA: Diagnosis present

## 2017-08-04 DIAGNOSIS — M549 Dorsalgia, unspecified: Secondary | ICD-10-CM | POA: Diagnosis present

## 2017-08-04 DIAGNOSIS — Z79899 Other long term (current) drug therapy: Secondary | ICD-10-CM

## 2017-08-04 DIAGNOSIS — N200 Calculus of kidney: Secondary | ICD-10-CM | POA: Diagnosis present

## 2017-08-04 DIAGNOSIS — Z87891 Personal history of nicotine dependence: Secondary | ICD-10-CM

## 2017-08-04 DIAGNOSIS — R600 Localized edema: Secondary | ICD-10-CM | POA: Diagnosis present

## 2017-08-04 DIAGNOSIS — R7881 Bacteremia: Secondary | ICD-10-CM | POA: Diagnosis present

## 2017-08-04 DIAGNOSIS — E876 Hypokalemia: Secondary | ICD-10-CM | POA: Diagnosis present

## 2017-08-04 DIAGNOSIS — M898X1 Other specified disorders of bone, shoulder: Secondary | ICD-10-CM | POA: Diagnosis present

## 2017-08-04 DIAGNOSIS — L8990 Pressure ulcer of unspecified site, unspecified stage: Secondary | ICD-10-CM | POA: Insufficient documentation

## 2017-08-04 DIAGNOSIS — M7989 Other specified soft tissue disorders: Secondary | ICD-10-CM

## 2017-08-04 DIAGNOSIS — Z9079 Acquired absence of other genital organ(s): Secondary | ICD-10-CM

## 2017-08-04 DIAGNOSIS — E1169 Type 2 diabetes mellitus with other specified complication: Secondary | ICD-10-CM | POA: Diagnosis present

## 2017-08-04 DIAGNOSIS — Z794 Long term (current) use of insulin: Secondary | ICD-10-CM

## 2017-08-04 DIAGNOSIS — L02213 Cutaneous abscess of chest wall: Principal | ICD-10-CM | POA: Diagnosis present

## 2017-08-04 DIAGNOSIS — K7460 Unspecified cirrhosis of liver: Secondary | ICD-10-CM | POA: Diagnosis present

## 2017-08-04 DIAGNOSIS — A4101 Sepsis due to Methicillin susceptible Staphylococcus aureus: Secondary | ICD-10-CM | POA: Diagnosis present

## 2017-08-04 DIAGNOSIS — D649 Anemia, unspecified: Secondary | ICD-10-CM | POA: Diagnosis present

## 2017-08-04 DIAGNOSIS — L02212 Cutaneous abscess of back [any part, except buttock]: Secondary | ICD-10-CM

## 2017-08-04 DIAGNOSIS — Z792 Long term (current) use of antibiotics: Secondary | ICD-10-CM

## 2017-08-04 DIAGNOSIS — R188 Other ascites: Secondary | ICD-10-CM | POA: Diagnosis present

## 2017-08-04 DIAGNOSIS — I129 Hypertensive chronic kidney disease with stage 1 through stage 4 chronic kidney disease, or unspecified chronic kidney disease: Secondary | ICD-10-CM | POA: Diagnosis present

## 2017-08-04 DIAGNOSIS — K5900 Constipation, unspecified: Secondary | ICD-10-CM | POA: Diagnosis not present

## 2017-08-04 DIAGNOSIS — I2510 Atherosclerotic heart disease of native coronary artery without angina pectoris: Secondary | ICD-10-CM | POA: Diagnosis present

## 2017-08-04 DIAGNOSIS — I10 Essential (primary) hypertension: Secondary | ICD-10-CM | POA: Diagnosis present

## 2017-08-04 DIAGNOSIS — N189 Chronic kidney disease, unspecified: Secondary | ICD-10-CM | POA: Diagnosis present

## 2017-08-04 DIAGNOSIS — E1165 Type 2 diabetes mellitus with hyperglycemia: Secondary | ICD-10-CM | POA: Diagnosis present

## 2017-08-04 DIAGNOSIS — E8809 Other disorders of plasma-protein metabolism, not elsewhere classified: Secondary | ICD-10-CM | POA: Diagnosis present

## 2017-08-04 DIAGNOSIS — E785 Hyperlipidemia, unspecified: Secondary | ICD-10-CM | POA: Diagnosis present

## 2017-08-04 HISTORY — DX: Type 2 diabetes mellitus without complications: E11.9

## 2017-08-04 HISTORY — DX: Cardiac murmur, unspecified: R01.1

## 2017-08-04 LAB — I-STAT CHEM 8, ED
BUN: 6 mg/dL (ref 6–20)
CALCIUM ION: 1.08 mmol/L — AB (ref 1.15–1.40)
Chloride: 98 mmol/L — ABNORMAL LOW (ref 101–111)
Creatinine, Ser: 0.5 mg/dL — ABNORMAL LOW (ref 0.61–1.24)
GLUCOSE: 123 mg/dL — AB (ref 65–99)
HCT: 37 % — ABNORMAL LOW (ref 39.0–52.0)
Hemoglobin: 12.6 g/dL — ABNORMAL LOW (ref 13.0–17.0)
Potassium: 4 mmol/L (ref 3.5–5.1)
SODIUM: 137 mmol/L (ref 135–145)
TCO2: 27 mmol/L (ref 22–32)

## 2017-08-04 LAB — CBC WITH DIFFERENTIAL/PLATELET
BASOS PCT: 1 %
Basophils Absolute: 0 10*3/uL (ref 0.0–0.1)
EOS ABS: 0 10*3/uL (ref 0.0–0.7)
EOS PCT: 1 %
HCT: 29.7 % — ABNORMAL LOW (ref 39.0–52.0)
HEMOGLOBIN: 10.4 g/dL — AB (ref 13.0–17.0)
LYMPHS ABS: 1.7 10*3/uL (ref 0.7–4.0)
Lymphocytes Relative: 26 %
MCH: 30.9 pg (ref 26.0–34.0)
MCHC: 35 g/dL (ref 30.0–36.0)
MCV: 88.1 fL (ref 78.0–100.0)
MONO ABS: 0.6 10*3/uL (ref 0.1–1.0)
Monocytes Relative: 9 %
NEUTROS PCT: 65 %
Neutro Abs: 4.1 10*3/uL (ref 1.7–7.7)
PLATELETS: 103 10*3/uL — AB (ref 150–400)
RBC: 3.37 MIL/uL — ABNORMAL LOW (ref 4.22–5.81)
RDW: 15.1 % (ref 11.5–15.5)
WBC: 6.4 10*3/uL (ref 4.0–10.5)

## 2017-08-04 LAB — URINALYSIS, ROUTINE W REFLEX MICROSCOPIC
Bilirubin Urine: NEGATIVE
Glucose, UA: NEGATIVE mg/dL
KETONES UR: NEGATIVE mg/dL
NITRITE: NEGATIVE
PH: 8 (ref 5.0–8.0)
Protein, ur: 100 mg/dL — AB
Specific Gravity, Urine: 1.012 (ref 1.005–1.030)
Squamous Epithelial / HPF: NONE SEEN

## 2017-08-04 LAB — COMPREHENSIVE METABOLIC PANEL
ALBUMIN: 1.5 g/dL — AB (ref 3.5–5.0)
ALK PHOS: 106 U/L (ref 38–126)
ALT: 36 U/L (ref 17–63)
ANION GAP: 3 — AB (ref 5–15)
AST: 92 U/L — ABNORMAL HIGH (ref 15–41)
BILIRUBIN TOTAL: 1.9 mg/dL — AB (ref 0.3–1.2)
BUN: 5 mg/dL — ABNORMAL LOW (ref 6–20)
CALCIUM: 7.9 mg/dL — AB (ref 8.9–10.3)
CO2: 27 mmol/L (ref 22–32)
Chloride: 101 mmol/L (ref 101–111)
Creatinine, Ser: 0.67 mg/dL (ref 0.61–1.24)
Glucose, Bld: 121 mg/dL — ABNORMAL HIGH (ref 65–99)
Potassium: 3.9 mmol/L (ref 3.5–5.1)
SODIUM: 131 mmol/L — AB (ref 135–145)
TOTAL PROTEIN: 9 g/dL — AB (ref 6.5–8.1)

## 2017-08-04 LAB — I-STAT CG4 LACTIC ACID, ED
LACTIC ACID, VENOUS: 2.47 mmol/L — AB (ref 0.5–1.9)
LACTIC ACID, VENOUS: 2.65 mmol/L — AB (ref 0.5–1.9)

## 2017-08-04 LAB — HEMOGLOBIN A1C
Hgb A1c MFr Bld: 12.8 % — ABNORMAL HIGH (ref 4.8–5.6)
Mean Plasma Glucose: 320.66 mg/dL

## 2017-08-04 LAB — GLUCOSE, CAPILLARY
Glucose-Capillary: 187 mg/dL — ABNORMAL HIGH (ref 65–99)
Glucose-Capillary: 224 mg/dL — ABNORMAL HIGH (ref 65–99)

## 2017-08-04 LAB — CBG MONITORING, ED: GLUCOSE-CAPILLARY: 184 mg/dL — AB (ref 65–99)

## 2017-08-04 MED ORDER — ENSURE ENLIVE PO LIQD: 237.0000 mL | Freq: Two times a day (BID) | ORAL | Status: DC

## 2017-08-04 MED ORDER — SODIUM CHLORIDE 0.9% FLUSH
10.0000 mL | INTRAVENOUS | Status: DC | PRN
  Administered 2017-08-05 – 2017-08-13 (×3): 10 mL
  Filled 2017-08-04 (×3): qty 40

## 2017-08-04 MED ORDER — LISINOPRIL 5 MG PO TABS
5.0000 mg | ORAL_TABLET | Freq: Every day | ORAL | Status: DC
  Administered 2017-08-05 – 2017-08-13 (×9): 5 mg via ORAL
  Filled 2017-08-04 (×9): qty 1

## 2017-08-04 MED ORDER — BISACODYL 10 MG RE SUPP
10.0000 mg | Freq: Every day | RECTAL | Status: DC | PRN
  Administered 2017-08-05 – 2017-08-09 (×2): 10 mg via RECTAL
  Filled 2017-08-04 (×2): qty 1

## 2017-08-04 MED ORDER — PIPERACILLIN-TAZOBACTAM 3.375 G IVPB
3.3750 g | Freq: Three times a day (TID) | INTRAVENOUS | Status: DC
  Administered 2017-08-04 – 2017-08-06 (×6): 3.375 g via INTRAVENOUS
  Filled 2017-08-04 (×7): qty 50

## 2017-08-04 MED ORDER — ONDANSETRON HCL 4 MG PO TABS: 4.0000 mg | ORAL_TABLET | Freq: Four times a day (QID) | ORAL | Status: DC | PRN

## 2017-08-04 MED ORDER — SIMVASTATIN 40 MG PO TABS
40.0000 mg | ORAL_TABLET | Freq: Every day | ORAL | Status: DC
  Administered 2017-08-05 – 2017-08-13 (×9): 40 mg via ORAL
  Filled 2017-08-04 (×9): qty 1

## 2017-08-04 MED ORDER — SENNOSIDES-DOCUSATE SODIUM 8.6-50 MG PO TABS: 1.0000 | ORAL_TABLET | Freq: Every evening | ORAL | Status: DC | PRN

## 2017-08-04 MED ORDER — IOPAMIDOL (ISOVUE-300) INJECTION 61%
INTRAVENOUS | Status: AC
  Administered 2017-08-04: 75 mL
  Filled 2017-08-04: qty 75

## 2017-08-04 MED ORDER — HYDROCODONE-ACETAMINOPHEN 5-325 MG PO TABS
1.0000 | ORAL_TABLET | ORAL | Status: DC | PRN
  Administered 2017-08-06 – 2017-08-13 (×3): 1 via ORAL
  Filled 2017-08-04 (×3): qty 1

## 2017-08-04 MED ORDER — SODIUM CHLORIDE 0.9 % IV SOLN
INTRAVENOUS | Status: DC
  Administered 2017-08-04 – 2017-08-05 (×2): via INTRAVENOUS

## 2017-08-04 MED ORDER — ACETAMINOPHEN 325 MG PO TABS
650.0000 mg | ORAL_TABLET | Freq: Four times a day (QID) | ORAL | Status: DC | PRN
  Administered 2017-08-08: 650 mg via ORAL
  Filled 2017-08-04: qty 2

## 2017-08-04 MED ORDER — METOPROLOL TARTRATE 12.5 MG HALF TABLET
12.5000 mg | ORAL_TABLET | Freq: Two times a day (BID) | ORAL | Status: DC
  Administered 2017-08-04 – 2017-08-13 (×15): 12.5 mg via ORAL
  Filled 2017-08-04 (×18): qty 1

## 2017-08-04 MED ORDER — INSULIN ASPART 100 UNIT/ML ~~LOC~~ SOLN
0.0000 [IU] | Freq: Three times a day (TID) | SUBCUTANEOUS | Status: DC
  Administered 2017-08-05 – 2017-08-06 (×2): 1 [IU] via SUBCUTANEOUS
  Administered 2017-08-07: 5 [IU] via SUBCUTANEOUS
  Administered 2017-08-07 – 2017-08-09 (×4): 2 [IU] via SUBCUTANEOUS
  Administered 2017-08-09: 1 [IU] via SUBCUTANEOUS
  Administered 2017-08-10 (×2): 2 [IU] via SUBCUTANEOUS
  Administered 2017-08-11 (×2): 3 [IU] via SUBCUTANEOUS
  Administered 2017-08-12: 2 [IU] via SUBCUTANEOUS
  Administered 2017-08-12: 1 [IU] via SUBCUTANEOUS
  Administered 2017-08-12 – 2017-08-13 (×2): 2 [IU] via SUBCUTANEOUS

## 2017-08-04 MED ORDER — ONDANSETRON HCL 4 MG/2ML IJ SOLN: 4.0000 mg | Freq: Four times a day (QID) | INTRAMUSCULAR | Status: DC | PRN

## 2017-08-04 MED ORDER — INSULIN GLARGINE 100 UNIT/ML ~~LOC~~ SOLN
14.0000 [IU] | Freq: Every day | SUBCUTANEOUS | Status: DC
  Administered 2017-08-04 – 2017-08-11 (×8): 14 [IU] via SUBCUTANEOUS
  Filled 2017-08-04 (×10): qty 0.14

## 2017-08-04 MED ORDER — VANCOMYCIN HCL IN DEXTROSE 750-5 MG/150ML-% IV SOLN
750.0000 mg | Freq: Three times a day (TID) | INTRAVENOUS | Status: DC
  Administered 2017-08-05 – 2017-08-06 (×5): 750 mg via INTRAVENOUS
  Filled 2017-08-04 (×6): qty 150

## 2017-08-04 MED ORDER — ACETAMINOPHEN 650 MG RE SUPP: 650.0000 mg | Freq: Four times a day (QID) | RECTAL | Status: DC | PRN

## 2017-08-04 MED ORDER — VANCOMYCIN HCL 10 G IV SOLR
1250.0000 mg | Freq: Once | INTRAVENOUS | Status: AC
  Administered 2017-08-04: 1250 mg via INTRAVENOUS
  Filled 2017-08-04 (×2): qty 1250

## 2017-08-04 MED ORDER — LIDOCAINE-EPINEPHRINE (PF) 2 %-1:200000 IJ SOLN
20.0000 mL | Freq: Once | INTRAMUSCULAR | Status: DC
  Filled 2017-08-04: qty 20

## 2017-08-04 MED ORDER — PIPERACILLIN-TAZOBACTAM 3.375 G IVPB 30 MIN
3.3750 g | Freq: Once | INTRAVENOUS | Status: AC
  Administered 2017-08-04: 3.375 g via INTRAVENOUS
  Filled 2017-08-04: qty 50

## 2017-08-04 NOTE — Consult Note (Signed)
Reason for Consult:  Painful left scapula fluid collection Referring Physician: D Octavio Matheney is an 56 y.o. male.  HPI: Patient is a 56 year old male brought [facility] for possible I&D of his left scapular fluid collection which they think is an abscess. Patient states that this is been present for about 2 weeks and is enlarged. He was recently hospitalized from 07/24/17 through 07/29/17. He underwent cystoscopy with transurethral resection of prostate unroofing of a prostatic abscess by Dr. Shirlyn Goltz on 917/18. He was diagnosed with hyperglycemia and sepsis due to an unspecified organism along with a UTI and prostatic abscess. He's been a skilled nursing facility. He was seen by [facility].  He was then transferred from skilled nursing facility to our emergency department for evaluation of this area below his left scapula.  Workup in the ED shows he is afebrile somewhat hypertensive. WBC is 6.4, hemoglobin 10.4 hematocrit 29.7 platelets 103,000. Lactate is 2.47 no acute changes on CMP.  On exam he has 4 areas of swelling and fluid like collections. 1.  One left scapula area actually just below it. This is a 9 x 10 cm. He has a left axillary fluid collection measures 10 x 9 cm.  He has a 7 cm fluid collection over the tip of the right scapula. He has a hard dense areas about 5 cm in diameter and tender under the right axilla. he also has significant abdominal swelling and lower leg edema and swelling.      Past Medical History:  Diagnosis Date  . Diabetes mellitus without complication [facility]     Past Surgical History:  Procedure Laterality Date  . none    . TRANSURETHRAL RESECTION OF PROSTATE N/A 07/26/2017   Procedure: TRANSURETHRAL RESECTION OF THE PROSTATE (TURP) WITH UPROOFING OF PROSTATE ABSCESS;  Surgeon: Irine Seal, MD;  Location: [facility] ORS;  Service: Urology;  Laterality: N/A;    Family History  Problem Relation Age of Onset  .  Hyperlipidemia Mother   . Diabetes Mother   . Hyperlipidemia Father   . Diabetes Father     Social History:  reports that he has quit smoking. His smoking use included Cigarettes. He has a 5.00 pack-year smoking history. He has never used smokeless tobacco. He reports that he drinks alcohol. He reports that he does not use drugs.  Allergies: No Known Allergies  Prior to Admission medications   Medication Sig Start Date End Date Taking? Authorizing Provider  acetaminophen (TYLENOL) 325 MG tablet Take 325 mg by mouth every 6 (six) hours as needed for mild pain.   Yes [provider]  glipiZIDE (GLUCOTROL) 10 MG tablet Take 1 tablet (10 mg total) by mouth 2 (two) times daily before a meal. 07/15/17  Yes Merlyn Lot, MD  lisinopril (PRINIVIL,ZESTRIL) 5 MG tablet Take 1 tablet (5 mg total) by mouth daily. 01/27/17  Yes Jearld Fenton, NP  metFORMIN (GLUCOPHAGE) 1000 MG tablet Take 1 tablet (1,000 mg total) by mouth 2 (two) times daily. 07/15/17  Yes Merlyn Lot, MD  simvastatin (ZOCOR) 40 MG tablet Take 1 tablet (40 mg total) by mouth daily. 01/27/17  Yes Baity, Coralie Keens, NP  ceFAZolin (ANCEF) IVPB Inject 1 g into the vein every 8 (eight) hours. Indication:  MSSA bacteremia Last Day of Therapy: october 29th Labs - Once weekly:  CBC/D and BMP, Labs - Every other week:  ESR and CRP 07/29/17   Epifanio Lesches, MD  insulin aspart (NOVOLOG FLEXPEN) 100 UNIT/ML  FlexPen Inject 5 units subcutaneously before meals and and bedtime for DM CBG > or = 150.  Notify MD for CBG < 70 or > 500    [provider]  insulin glargine (LANTUS) 100 UNIT/ML injection Inject 0.14 mLs (14 Units total) into the skin at bedtime. 07/29/17   Epifanio Lesches, MD  metoprolol tartrate (LOPRESSOR) 25 MG tablet Take 0.5 tablets (12.5 mg total) by mouth 2 (two) times daily. 07/29/17   Epifanio Lesches, MD  oxyCODONE (ROXICODONE) 5 MG immediate release tablet Take 1 tablet (5 mg total) by mouth  every 4 (four) hours as needed for severe pain. 08/02/17   Gerlene Fee, NP     Results for orders placed or performed during the hospital encounter of 08/04/17 (from the past 48 hour(s))  CBC with Differential     Status: Abnormal   Collection Time: 08/04/17 11:45 AM  Result Value Ref Range   WBC 6.4 4.0 - 10.5 K/uL   RBC 3.37 (L) 4.22 - 5.81 MIL/uL   Hemoglobin 10.4 (L) 13.0 - 17.0 g/dL   HCT 29.7 (L) 39.0 - 52.0 %   MCV 88.1 78.0 - 100.0 fL   MCH 30.9 26.0 - 34.0 pg   MCHC 35.0 30.0 - 36.0 g/dL   RDW 15.1 11.5 - 15.5 %   Platelets 103 (L) 150 - 400 K/uL    Comment: PLATELET COUNT CONFIRMED BY SMEAR   Neutrophils Relative % 65 %   Neutro Abs 4.1 1.7 - 7.7 K/uL   Lymphocytes Relative 26 %   Lymphs Abs 1.7 0.7 - 4.0 K/uL   Monocytes Relative 9 %   Monocytes Absolute 0.6 0.1 - 1.0 K/uL   Eosinophils Relative 1 %   Eosinophils Absolute 0.0 0.0 - 0.7 K/uL   Basophils Relative 1 %   Basophils Absolute 0.0 0.0 - 0.1 K/uL  Comprehensive metabolic panel     Status: Abnormal   Collection Time: 08/04/17 11:45 AM  Result Value Ref Range   Sodium 131 (L) 135 - 145 mmol/L   Potassium 3.9 3.5 - 5.1 mmol/L   Chloride 101 101 - 111 mmol/L   CO2 27 22 - 32 mmol/L   Glucose, Bld 121 (H) 65 - 99 mg/dL   BUN 5 (L) 6 - 20 mg/dL   Creatinine, Ser 0.67 0.61 - 1.24 mg/dL   Calcium 7.9 (L) 8.9 - 10.3 mg/dL   Total Protein 9.0 (H) 6.5 - 8.1 g/dL   Albumin 1.5 (L) 3.5 - 5.0 g/dL   AST 92 (H) 15 - 41 U/L   ALT 36 17 - 63 U/L   Alkaline Phosphatase 106 38 - 126 U/L   Total Bilirubin 1.9 (H) 0.3 - 1.2 mg/dL   GFR calc non Af Amer >60 >60 mL/min   GFR calc Af Amer >60 >60 mL/min    Comment: (NOTE) The eGFR has been calculated using the CKD EPI equation. This calculation has not been validated in all clinical situations. eGFR's persistently <60 mL/min signify possible Chronic Kidney Disease.    Anion gap 3 (L) 5 - 15  I-Stat CG4 Lactic Acid, ED     Status: Abnormal   Collection Time:  08/04/17 12:06 PM  Result Value Ref Range   Lactic Acid, Venous 2.47 (HH) 0.5 - 1.9 mmol/L   Comment NOTIFIED PHYSICIAN   I-Stat Chem 8, ED     Status: Abnormal   Collection Time: 08/04/17 12:06 PM  Result Value Ref Range   Sodium 137 135 -  145 mmol/L   Potassium 4.0 3.5 - 5.1 mmol/L   Chloride 98 (L) 101 - 111 mmol/L   BUN 6 6 - 20 mg/dL   Creatinine, Ser 0.50 (L) 0.61 - 1.24 mg/dL   Glucose, Bld 123 (H) 65 - 99 mg/dL   Calcium, Ion 1.08 (L) 1.15 - 1.40 mmol/L   TCO2 27 22 - 32 mmol/L   Hemoglobin 12.6 (L) 13.0 - 17.0 g/dL   HCT 37.0 (L) 39.0 - 52.0 %    No results found.  Review of Systems  Constitutional: Positive for chills and weight loss (weight gain). Negative for fever and malaise/fatigue.  HENT: Negative.   Eyes: Negative.   Respiratory: Positive for shortness of breath (DOE). Negative for cough, hemoptysis, sputum production and wheezing.   Cardiovascular: Positive for leg swelling. Negative for chest pain, palpitations, orthopnea, claudication and PND.  Gastrointestinal: Positive for constipation. Negative for abdominal pain, blood in stool, diarrhea, heartburn, melena, nausea and vomiting.  Genitourinary: Negative.  Negative for dysuria, flank pain, frequency, hematuria and urgency.       Foley is out  Musculoskeletal: Negative.   Skin: Negative for itching and rash.       Were asked to see him for a left scapular fluid collection. But actually has 4 areas of fluid/swelling. One left scapula area actually just below it. This is a 9 x 10 cm. He has a left axillary fluid collection measures 10 x 9 cm. He has a 7 cm fluid collection over the tip of the right scapula. He has a hard dense areas about 5 cm in diameter and tender under the right axilla.  Neurological: Positive for headaches. Negative for dizziness, tingling, tremors, sensory change, speech change, focal weakness, seizures, loss of consciousness and weakness.  Endo/Heme/Allergies: Negative for environmental  allergies. Does not bruise/bleed easily.  Psychiatric/Behavioral: Positive for depression.   Blood pressure (!) 131/93, pulse 85, temperature 98.2 F (36.8 C), temperature source Oral, resp. rate 17, height 5' 9"  (1.753 m), weight 68 kg (150 lb), SpO2 100 %. Physical Exam  Constitutional: He is oriented to person, place, and time. He appears well-developed. No distress.  Practically ill-appearing man sitting up in a chair. Appears uncomfortable.  HENT:  Head: Normocephalic and atraumatic.  Mouth/Throat: No oropharyngeal exudate.  Eyes: Right eye exhibits no discharge. Left eye exhibits no discharge. No scleral icterus.  Pupils are equal  Neck: Normal range of motion. Neck supple. No JVD present. No tracheal deviation present. No thyromegaly present.  Cardiovascular: Normal rate, regular rhythm and normal heart sounds.   No murmur heard. Respiratory: Effort normal and breath sounds normal. No respiratory distress. He has no wheezes. He has no rales. He exhibits no tenderness.  GI: He exhibits distension. He exhibits no mass. There is no tenderness. There is no rebound and no guarding.  He sitting up in uncomfortable lying down apparently difficult to do good abdominal exam on him. But he has significant abdominal distention and perhaps ascites.  Genitourinary:  Genitourinary Comments: Foley is out and he is voiding normally.  Musculoskeletal: He exhibits edema (significant lower extremity edema). He exhibits no tenderness.  Lymphadenopathy:    He has no cervical adenopathy.  Neurological: He is alert and oriented to person, place, and time. No cranial nerve deficit.  Skin: Skin is warm and dry. No rash noted. He is not diaphoretic. No erythema. No pallor.     All the areas listed on the drawing abnormal skin with no erythema, no edema, somewhat  sore but no significant swelling or pain consistent with an infection.  Psychiatric: He has a normal mood and affect. His behavior is normal.  Judgment and thought content normal.    Assessment/Plan: Multiple upper chest fluid collections of uncertain etiology. Abdominal distention with normal LFTs. ? Cirrhosis ? Lower extremity edema Recent TURP and unroofing of prosthetic abscess. Sepsis Diabetes  Plan: Dr. Dalbert Batman and I have both examined the patient. These fluid collections do not appear to be abscesses. The patient is not septic and is clearly not emergent. He has significant abdominal distention, weight gain and lower extremity edema. We recommend formal medical evaluation. We will see again as needed and  after more information is available.  Mikaylah Libbey 08/04/2017, 1:59 PM

## 2017-08-04 NOTE — Progress Notes (Signed)
Pharmacy Antibiotic Note  Cameron Coffey is a 56 y.o. male admitted on 08/04/2017 for assessment of possible I&D of abscess of L scapula. Starting broad spectrum abx for possible cellulitis. LA 2.47, SCr 0.5, eCrCl > 80 ml/min.      Plan: -Vancomycin 1250 mg IV x1 then 750 mg IV q8h -Zosyn 3.375 g IV q8h -Monitor renal fx, cultures, VT at Css  Height:  (175.3 cm) Weight: 150 lb (68 kg) IBW/kg (Calculated) : 70.7  Temp (24hrs), Avg:98.2 F (36.8 C), Min:98.2 F (36.8 C), Max:98.2 F (36.8 C)   Recent Labs Lab 07/31/17 08/04/17 1145 08/04/17 1206  WBC  --  6.4  --   CREATININE 0.5* 0.67 0.50*  LATICACIDVEN  --   --  2.47*     Antimicrobials this admission: 9/26 vancomycin > 9/26 zosyn >  Dose adjustments this admission: N/A  Microbiology results: 9/26 blood cx: 9/26 urine cx:  Cameron Coffey 08/04/2017 3:17 PM

## 2017-08-04 NOTE — ED Triage Notes (Signed)
Patient brought in by [facility] from [facility] for assessment and possible I/D of abcess on left scapula.  Patient also complains of severe pain in his penis, arrives with foley catheter in place after recent TURP.  Patient is alert and oriented at this time.

## 2017-08-04 NOTE — ED Provider Notes (Signed)
[place] DEPT Provider Note   CSN: [id number] Arrival date & time: 08/04/17  1779     History   Chief Complaint No chief complaint on file.   HPI Cameron Coffey is a 56 y.o. male.  56 yo M with a cc penile pain from the catheter as well as left scapular mass. Going on for past week or so.  Patient denies fevers or chills. The patient has been having this followed by the doctors at his nursing home. He is recently in the hospital for a prostate abscess. This was fixed by a TURP. He is having significant pain from the tip of his penis with catheter. He denies any continued fevers and chills. He is on antibiotics currently.   The history is provided by the patient.  Illness  This is a new problem. The current episode started 2 days ago. The problem occurs constantly. The problem has not changed since onset.Pertinent negatives include no chest pain, no abdominal pain, no headaches and no shortness of breath. Nothing aggravates the symptoms. Nothing relieves the symptoms. He has tried nothing for the symptoms. The treatment provided no relief.    Past Medical History:  Diagnosis Date  . Diabetes mellitus without complication [facility])     Patient Active Problem List   Diagnosis Date Noted  . Pain in scapula 08/04/2017  . Dyslipidemia associated with type 2 diabetes mellitus ([place]) 07/30/2017  . Type II diabetes with long term use of insulin ([place]) 07/30/2017  . MSSA (methicillin susceptible Staphylococcus aureus) septicemia ([place]) 07/30/2017  . Mass on back 07/30/2017  . Acute bilateral back pain 07/30/2017  . Constipation, chronic 07/30/2017  . Bilateral nephrolithiasis 07/30/2017  . Sepsis ([place]) 07/24/2017  . Essential hypertension 05/28/2015    Past Surgical History:  Procedure Laterality Date  . none    . TRANSURETHRAL RESECTION OF PROSTATE N/A 07/26/2017   Procedure: TRANSURETHRAL RESECTION OF THE PROSTATE (TURP) WITH UPROOFING OF PROSTATE ABSCESS;  Surgeon: Irine Seal,  MD;  Location: [facility] ORS;  Service: Urology;  Laterality: N/A;       Home Medications    Prior to Admission medications   Medication Sig Start Date End Date Taking? Authorizing Provider  acetaminophen (TYLENOL) 325 MG tablet Take 325 mg by mouth every 6 (six) hours as needed for mild pain.   Yes [provider]  glipiZIDE (GLUCOTROL) 10 MG tablet Take 1 tablet (10 mg total) by mouth 2 (two) times daily before a meal. 07/15/17  Yes Merlyn Lot, MD  lisinopril (PRINIVIL,ZESTRIL) 5 MG tablet Take 1 tablet (5 mg total) by mouth daily. 01/27/17  Yes Jearld Fenton, NP  metFORMIN (GLUCOPHAGE) 1000 MG tablet Take 1 tablet (1,000 mg total) by mouth 2 (two) times daily. 07/15/17  Yes Merlyn Lot, MD  simvastatin (ZOCOR) 40 MG tablet Take 1 tablet (40 mg total) by mouth daily. 01/27/17  Yes Baity, Coralie Keens, NP  ceFAZolin (ANCEF) IVPB Inject 1 g into the vein every 8 (eight) hours. Indication:  MSSA bacteremia Last Day of Therapy: october 29th Labs - Once weekly:  CBC/D and BMP, Labs - Every other week:  ESR and CRP 07/29/17   Epifanio Lesches, MD  insulin aspart (NOVOLOG FLEXPEN) 100 UNIT/ML FlexPen Inject 5 units subcutaneously before meals and and bedtime for DM CBG > or = 150.  Notify MD for CBG < 70 or > 500    [provider]  insulin glargine (LANTUS) 100 UNIT/ML injection Inject 0.14 mLs (14 Units total) into the  skin at bedtime. 07/29/17   Epifanio Lesches, MD  metoprolol tartrate (LOPRESSOR) 25 MG tablet Take 0.5 tablets (12.5 mg total) by mouth 2 (two) times daily. 07/29/17   Epifanio Lesches, MD  oxyCODONE (ROXICODONE) 5 MG immediate release tablet Take 1 tablet (5 mg total) by mouth every 4 (four) hours as needed for severe pain. 08/02/17   Gerlene Fee, NP    Family History Family History  Problem Relation Age of Onset  . Hyperlipidemia Mother   . Diabetes Mother   . Hyperlipidemia Father   . Diabetes Father     Social History Social History    Substance Use Topics  . Smoking status: Former Smoker    Packs/day: 0.50    Years: 10.00    Types: Cigarettes  . Smokeless tobacco: Never Used  . Alcohol use 0.0 oz/week     Comment: weekend beer drinker     Allergies   Patient has no known allergies.   Review of Systems Review of Systems  Constitutional: Negative for chills and fever.  HENT: Negative for congestion and facial swelling.   Eyes: Negative for discharge and visual disturbance.  Respiratory: Negative for shortness of breath.   Cardiovascular: Negative for chest pain and palpitations.  Gastrointestinal: Negative for abdominal pain, diarrhea and vomiting.  Genitourinary: Positive for penile pain.  Musculoskeletal: Negative for arthralgias and myalgias.  Skin: Positive for wound. Negative for color change and rash.  Neurological: Negative for tremors, syncope and headaches.  Psychiatric/Behavioral: Negative for confusion and dysphoric mood.     Physical Exam Updated Vital Signs BP 120/72   Pulse 92   Temp 98.2 F (36.8 C) (Oral)   Resp 17   Ht 5' 9"  (1.753 m)   Wt 68 kg (150 lb)   SpO2 100%   BMI 22.15 kg/m   Physical Exam  Constitutional: He is oriented to person, place, and time. He appears well-developed and well-nourished.  HENT:  Head: Normocephalic and atraumatic.  Eyes: Pupils are equal, round, and reactive to light. EOM are normal.  Neck: Normal range of motion. Neck supple. No JVD present.  Cardiovascular: Normal rate and regular rhythm.  Exam reveals no gallop and no friction rub.   No murmur heard. Pulmonary/Chest: No respiratory distress. He has no wheezes.  Abdominal: He exhibits no distension and no mass. There is no tenderness. There is no rebound and no guarding.  Stage II sacral ulcer to the base of the sacrum.  Genitourinary: Penile tenderness present.  Musculoskeletal: Normal range of motion.  Neurological: He is alert and oriented to person, place, and time.  Skin: No rash  noted. No pallor.  Very large fluctuant area just underneath the left scapula. No overlying erythema. Tender to palpation  Psychiatric: He has a normal mood and affect. His behavior is normal.  Nursing note and vitals reviewed.    ED Treatments / Results  Labs (all labs ordered are listed, but only abnormal results are displayed) Labs Reviewed  CBC WITH DIFFERENTIAL/PLATELET - Abnormal; Notable for the following:       Result Value   RBC 3.37 (*)    Hemoglobin 10.4 (*)    HCT 29.7 (*)    Platelets 103 (*)    All other components within normal limits  COMPREHENSIVE METABOLIC PANEL - Abnormal; Notable for the following:    Sodium 131 (*)    Glucose, Bld 121 (*)    BUN 5 (*)    Calcium 7.9 (*)    Total Protein  9.0 (*)    Albumin 1.5 (*)    AST 92 (*)    Total Bilirubin 1.9 (*)    Anion gap 3 (*)    All other components within normal limits  I-STAT CG4 LACTIC ACID, ED - Abnormal; Notable for the following:    Lactic Acid, Venous 2.47 (*)    All other components within normal limits  I-STAT CHEM 8, ED - Abnormal; Notable for the following:    Chloride 98 (*)    Creatinine, Ser 0.50 (*)    Glucose, Bld 123 (*)    Calcium, Ion 1.08 (*)    Hemoglobin 12.6 (*)    HCT 37.0 (*)    All other components within normal limits  CULTURE, BLOOD (ROUTINE X 2)  CULTURE, BLOOD (ROUTINE X 2)  URINE CULTURE  URINALYSIS, ROUTINE W REFLEX MICROSCOPIC  HEMOGLOBIN A1C  I-STAT CG4 LACTIC ACID, ED    EKG  EKG Interpretation None       Radiology No results found.  Procedures Procedures (including critical care time)  Medications Ordered in ED Medications  lidocaine-EPINEPHrine (XYLOCAINE W/EPI) 2 %-1:200000 (PF) injection 20 mL (not administered)  vancomycin (VANCOCIN) 1,250 mg in sodium chloride 0.9 % 250 mL IVPB (not administered)  piperacillin-tazobactam (ZOSYN) IVPB 3.375 g (not administered)  iopamidol (ISOVUE-300) 61 % injection (not administered)  insulin glargine (LANTUS)  injection 14 Units (not administered)  metoprolol tartrate (LOPRESSOR) tablet 12.5 mg (not administered)  lisinopril (PRINIVIL,ZESTRIL) tablet 5 mg (not administered)  simvastatin (ZOCOR) tablet 40 mg (not administered)  0.9 %  sodium chloride infusion (not administered)  acetaminophen (TYLENOL) tablet 650 mg (not administered)    Or  acetaminophen (TYLENOL) suppository 650 mg (not administered)  senna-docusate (Senokot-S) tablet 1 tablet (not administered)  bisacodyl (DULCOLAX) suppository 10 mg (not administered)  ondansetron (ZOFRAN) tablet 4 mg (not administered)    Or  ondansetron (ZOFRAN) injection 4 mg (not administered)  HYDROcodone-acetaminophen (NORCO/VICODIN) 5-325 MG per tablet 1-2 tablet (not administered)  insulin aspart (novoLOG) injection 0-9 Units (not administered)     Initial Impression / Assessment and Plan / ED Course  I have reviewed the triage vital signs and the nursing notes.  Pertinent labs & imaging results that were available during my care of the patient were reviewed by me and considered in my medical decision making (see chart for details).     56 yo M With a chief complaint of pain from the end of his penis from his catheter. I discussed the case with the urologist who performed the surgery Dr. Jeffie Pollock, he felt it was okay to have it removed at this time. He said he then needs to be replaced and likely needs be done with a caudae.  The patient has a second complaint is a large mass that it's been there at least for the past 8 days. He is currently on antibiotics. There is no overlying erythema or cellulitis. He had a CT scan that showed the extent of this though it was not remarked on in the report. I will obtain labs the patient is a poorly compliant diabetic at baseline and discussed with general surgery as I feel this is too large for me to I&D at bedside.   Discussed with general surgery PA who saw at bedside.  Old images reviewed by me. Seen on prior CT  though not in read.  Evaled by surgery.  Feel that no surgical intervention is required at this time. There is some concern that this is  infectious with the patient recently having bacteremia. Recommended repeat CT. will start on IV antibiotics and discuss with the hospitalist  The patients results and plan were reviewed and discussed.   Any x-rays performed were independently reviewed by myself.   Differential diagnosis were considered with the presenting HPI.  Medications  lidocaine-EPINEPHrine (XYLOCAINE W/EPI) 2 %-1:200000 (PF) injection 20 mL (not administered)  vancomycin (VANCOCIN) 1,250 mg in sodium chloride 0.9 % 250 mL IVPB (not administered)  piperacillin-tazobactam (ZOSYN) IVPB 3.375 g (not administered)  iopamidol (ISOVUE-300) 61 % injection (not administered)  insulin glargine (LANTUS) injection 14 Units (not administered)  metoprolol tartrate (LOPRESSOR) tablet 12.5 mg (not administered)  lisinopril (PRINIVIL,ZESTRIL) tablet 5 mg (not administered)  simvastatin (ZOCOR) tablet 40 mg (not administered)  0.9 %  sodium chloride infusion (not administered)  acetaminophen (TYLENOL) tablet 650 mg (not administered)    Or  acetaminophen (TYLENOL) suppository 650 mg (not administered)  senna-docusate (Senokot-S) tablet 1 tablet (not administered)  bisacodyl (DULCOLAX) suppository 10 mg (not administered)  ondansetron (ZOFRAN) tablet 4 mg (not administered)    Or  ondansetron (ZOFRAN) injection 4 mg (not administered)  HYDROcodone-acetaminophen (NORCO/VICODIN) 5-325 MG per tablet 1-2 tablet (not administered)  insulin aspart (novoLOG) injection 0-9 Units (not administered)    Vitals:   08/04/17 1200 08/04/17 1230 08/04/17 1430 08/04/17 1445  BP: 127/77 118/79 120/72   Pulse:    92  Resp:      Temp:      TempSrc:      SpO2:    100%  Weight:      Height:        Final diagnoses:  Soft tissue mass    Admission/ observation were discussed with the admitting physician,  patient and/or family and they are comfortable with the plan.   Final Clinical Impressions(s) / ED Diagnoses   Final diagnoses:  Soft tissue mass    New Prescriptions New Prescriptions   No medications on file     Deno Etienne, DO 08/04/17 1515

## 2017-08-04 NOTE — H&P (Signed)
History and Physical    Cameron Coffey OJJ:[id number] DOB: Jan 07, 1961 DOA: 08/04/2017   PCP: Jearld Fenton, NP   Patient coming from:  Home    Chief Complaint: left scapular swelling and pain   HPI: Cameron Coffey is a 56 y.o. male with medical history significant for HTN, HLD, CKD, COPD, CAD recently admitted from 9:15 [id number] to the hospital for TURP, with unroofing of the prostatic abscess on 07/26/2017. At that time, he was diagnosed with sepsis due to an unspecified organism along with UTI and prostatic abscess. The patient was sent to a brief stay to a skilled nursing facility .About 2 days ago, the patient begun to notice increasing pain in the left scapular area, constant, without achieving relief of the symptoms. He was sent to the emergency department for further evaluation. A CT of the chest with contrast today, showed Large fluid collection is seen tracking along the lateral and posterior portion of the left chest wall most consistent with abscess. Its inferior component measures 8.2 x 3.6 cm, and more superior subscapular component measures 5.3 x 2.8 cm. at the time of evaluation, the patient is in significant degree of pain, sitting at the bedside, very symptomatic Denies fevers, chills, night sweats Denies any respiratory complaints. Denies any chest pain or palpitations. Denies lower extremity swelling. Denies nausea, heartburn or change in bowel habits. Reports abdominal distention, not new from prior admission.  Appetite is  decreased. His dysuria and hematuria are improving.       ED Course:  BP 132/90   Pulse 69   Temp 98.2 F (36.8 C) (Oral)   Resp 17   Ht 5' 9"  (1.753 m)   Wt 68 kg (150 lb)   SpO2 100%   BMI 22.15 kg/m    White count 6.4 hemoglobin 10.4 hematocrit 29.7 platelets 103,000 Lactic acid 2.47 CT of the chest as above. Received pain medication with some relief of the symptoms. Surgeries has seen the patient in consultation, he is not deemed at this  time to the surgical candidate, due to the extent of the possible abscess. IV Zosyn and vancomycin were initiated at the ER, and it was recommended that he continues on the medication for now. Prior to this, cultures were drawn.  Review of Systems:  As per HPI otherwise all other systems reviewed and are negative  Past Medical History:  Diagnosis Date  . Diabetes mellitus without complication [facility])     Past Surgical History:  Procedure Laterality Date  . none    . TRANSURETHRAL RESECTION OF PROSTATE N/A 07/26/2017   Procedure: TRANSURETHRAL RESECTION OF THE PROSTATE (TURP) WITH UPROOFING OF PROSTATE ABSCESS;  Surgeon: Irine Seal, MD;  Location: [facility] ORS;  Service: Urology;  Laterality: N/A;    Social History Social History   Social History  . Marital status: Divorced    Spouse name: N/A  . Number of children: 3  . Years of education: N/A   Occupational History  . Truck Geophysicist/field seismologist    Social History Main Topics  . Smoking status: Former Smoker    Packs/day: 0.50    Years: 10.00    Types: Cigarettes  . Smokeless tobacco: Never Used  . Alcohol use 0.0 oz/week     Comment: weekend beer drinker  . Drug use: No  . Sexual activity: No   Other Topics Concern  . Not on file   Social History Narrative   Lives with mom     No Known Allergies  Family History  Problem Relation Age of Onset  . Hyperlipidemia Mother   . Diabetes Mother   . Hyperlipidemia Father   . Diabetes Father       Prior to Admission medications   Medication Sig Start Date End Date Taking? Authorizing Provider  acetaminophen (TYLENOL) 325 MG tablet Take 325 mg by mouth every 6 (six) hours as needed for mild pain.   Yes [provider]  glipiZIDE (GLUCOTROL) 10 MG tablet Take 1 tablet (10 mg total) by mouth 2 (two) times daily before a meal. 07/15/17  Yes Merlyn Lot, MD  lisinopril (PRINIVIL,ZESTRIL) 5 MG tablet Take 1 tablet (5 mg total) by mouth daily. 01/27/17  Yes Jearld Fenton, NP    metFORMIN (GLUCOPHAGE) 1000 MG tablet Take 1 tablet (1,000 mg total) by mouth 2 (two) times daily. 07/15/17  Yes Merlyn Lot, MD  simvastatin (ZOCOR) 40 MG tablet Take 1 tablet (40 mg total) by mouth daily. 01/27/17  Yes Baity, Coralie Keens, NP  ceFAZolin (ANCEF) IVPB Inject 1 g into the vein every 8 (eight) hours. Indication:  MSSA bacteremia Last Day of Therapy: october 29th Labs - Once weekly:  CBC/D and BMP, Labs - Every other week:  ESR and CRP 07/29/17   Epifanio Lesches, MD  insulin aspart (NOVOLOG FLEXPEN) 100 UNIT/ML FlexPen Inject 5 units subcutaneously before meals and and bedtime for DM CBG > or = 150.  Notify MD for CBG < 70 or > 500    [provider]  insulin glargine (LANTUS) 100 UNIT/ML injection Inject 0.14 mLs (14 Units total) into the skin at bedtime. 07/29/17   Epifanio Lesches, MD  metoprolol tartrate (LOPRESSOR) 25 MG tablet Take 0.5 tablets (12.5 mg total) by mouth 2 (two) times daily. 07/29/17   Epifanio Lesches, MD  oxyCODONE (ROXICODONE) 5 MG immediate release tablet Take 1 tablet (5 mg total) by mouth every 4 (four) hours as needed for severe pain. 08/02/17   Gerlene Fee, NP    Physical Exam:  Vitals:   08/04/17 1500 08/04/17 1600 08/04/17 1630 08/04/17 1700  BP: 120/79 128/90 131/88 132/90  Pulse: 69 74 73 69  Resp:      Temp:      TempSrc:      SpO2: 100% 100% 100% 100%  Weight:      Height:       Constitutional: Very uncomfortable, sitting at bedside, leaning on the bed  rail, and to relieve some of his pain. He is ill appearing,  alert, oriented, and able to answer questions appropriately. Eyes: PERRL, lids and conjunctivae normal ENMT: Mucous membranes are moist, without exudate or lesions  Neck: normal, supple, no masses, no thyromegaly Respiratory: clear to auscultation bilaterally, no wheezing, no crackles. Normal respiratory effort . Very  large left scapular fluid collection, with partial subscapular involvement, tender to  palpation, no erythema seen  Cardiovascular: Regular rate and rhythm,  2/6 murmur, rubs or gallops. 1+ extremity edema. 2+ pedal pulses. No carotid bruits.  Abdomen:  Patient not cooperative with exam, leaning forward on chair. Abdominal distention noted, possible fluid wave No hepatosplenomegaly. Bowel sounds positive.  Musculoskeletal: no clubbing / cyanosis. Moves all extremities Neurologic: Sensation intact  Strength equal in all extremities Psychiatric:   Alert and oriented x 3. Very anxious     Labs on Admission: I have personally reviewed following labs and imaging studies  CBC:  Recent Labs Lab 08/04/17 1145 08/04/17 1206  WBC 6.4  --   NEUTROABS 4.1  --  HGB 10.4* 12.6*  HCT 29.7* 37.0*  MCV 88.1  --   PLT 103*  --     Basic Metabolic Panel:  Recent Labs Lab 07/31/17 08/04/17 1145 08/04/17 1206  NA 134* 131* 137  K 3.9 3.9 4.0  CL  --  101 98*  CO2  --  27  --   GLUCOSE  --  121* 123*  BUN 5 5* 6  CREATININE 0.5* 0.67 0.50*  CALCIUM  --  7.9*  --     GFR: Estimated Creatinine Clearance: 100.3 mL/min (A) (by C-G formula based on SCr of 0.5 mg/dL (L)).  Liver Function Tests:  Recent Labs Lab 07/31/17 08/04/17 1145  AST 130* 92*  ALT 60* 36  ALKPHOS 183* 106  BILITOT  --  1.9*  PROT  --  9.0*  ALBUMIN  --  1.5*   No results for input(s): LIPASE, AMYLASE in the last 168 hours. No results for input(s): AMMONIA in the last 168 hours.  Coagulation Profile: No results for input(s): INR, PROTIME in the last 168 hours.  Cardiac Enzymes: No results for input(s): CKTOTAL, CKMB, CKMBINDEX, TROPONINI in the last 168 hours.  BNP (last 3 results) No results for input(s): PROBNP in the last 8760 hours.  HbA1C:  Recent Labs  08/04/17 1145  HGBA1C 12.8*    CBG:  Recent Labs Lab 07/28/17 2037 07/29/17 0850 07/29/17 1212 08/04/17 1715  GLUCAP 163* 260* 177* 184*    Lipid Profile: No results for input(s): CHOL, HDL, LDLCALC, TRIG, CHOLHDL,  LDLDIRECT in the last 72 hours.  Thyroid Function Tests: No results for input(s): TSH, T4TOTAL, FREET4, T3FREE, THYROIDAB in the last 72 hours.  Anemia Panel: No results for input(s): VITAMINB12, FOLATE, FERRITIN, TIBC, IRON, RETICCTPCT in the last 72 hours.  Urine analysis:    Component Value Date/Time   COLORURINE AMBER (A) 08/04/2017 1707   APPEARANCEUR CLOUDY (A) 08/04/2017 1707   LABSPEC 1.012 08/04/2017 1707   PHURINE 8.0 08/04/2017 1707   GLUCOSEU NEGATIVE 08/04/2017 1707   HGBUR LARGE (A) 08/04/2017 1707   BILIRUBINUR NEGATIVE 08/04/2017 1707   KETONESUR NEGATIVE 08/04/2017 1707   PROTEINUR 100 (A) 08/04/2017 1707   NITRITE NEGATIVE 08/04/2017 1707   LEUKOCYTESUR LARGE (A) 08/04/2017 1707    Sepsis Labs: @LABRCNTIP (procalcitonin:4,lacticidven:4) ) Recent Results (from the past 240 hour(s))  Culture, blood (single) w Reflex to ID Panel     Status: None   Collection Time: 07/26/17  4:54 PM  Result Value Ref Range Status   Specimen Description BLOOD LEFT ANTECUBITAL  Final   Special Requests   Final    BOTTLES DRAWN AEROBIC AND ANAEROBIC Blood Culture adequate volume   Culture NO GROWTH 5 DAYS  Final   Report Status 07/31/2017 FINAL  Final     Radiological Exams on Admission: Ct Chest W Contrast  Result Date: 08/04/2017 CLINICAL DATA:  Left-sided chest wall pain. EXAM: CT CHEST WITH CONTRAST TECHNIQUE: Multidetector CT imaging of the chest was performed during intravenous contrast administration. CONTRAST:  1m ISOVUE-300 IOPAMIDOL (ISOVUE-300) INJECTION 61% COMPARISON:  Radiograph of same day.  CT scan of July 27, 2017. FINDINGS: Cardiovascular: No significant vascular findings. Normal heart size. No pericardial effusion. 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 Mediastinum/Nodes: No enlarged mediastinal, hilar, or axillary lymph nodes. Thyroid gland, trachea, and esophagus demonstrate no significant findings. Lungs/Pleura: No  pneumothorax is noted. Minimal left pleural effusion is noted with adjacent subsegmental atelectasis. Stable 8 mm subpleural nodule is noted anteriorly in  right middle lobe best seen on image number 87 of series 7. Upper Abdomen: Hepatic cirrhosis is noted. Moderate ascites is noted in visualized portion of upper abdomen. Possible lesion is noted anteriorly in left hepatic lobe. Musculoskeletal: No fracture is noted. Large fluid collection is seen tracking along the lateral and posterior portion of the left chest wall most consistent with abscess. Subscapular component measures 5.3 x 2.8 cm. Posterior component measures 10.6 x 1.6 cm. Inferior and lateral component measures 8.2 x 3.6 cm. IMPRESSION: Large fluid collection is seen tracking along the lateral and posterior portion of the left chest wall most consistent with abscess. Its inferior component measures 8.2 x 3.6 cm, and more superior subscapular component measures 5.3 x 2.8 cm. Stable 8 mm right middle lobe nodule is noted. Non-contrast chest CT at 6-12 months is recommended. If the nodule is stable at time of repeat CT, then future CT at 18-24 months (from today's scan) is considered optional for low-risk patients, but is recommended for high-risk patients. This recommendation follows the consensus statement: Guidelines for Management of Incidental Pulmonary Nodules Detected on CT Images: From the Fleischner Society 2017; Radiology 2017; 284:228-243. Minimal left pleural effusion is noted with adjacent subsegmental atelectasis. Hepatic cirrhosis is noted with moderate ascites. Possible partially exophytic lesion is seen arising from left hepatic lobe; MRI of the liver with and without gadolinium is recommended on nonemergent basis to rule out neoplasm. Electronically Signed   By: Marijo Conception, M.D.   On: 08/04/2017 15:56   Dg Chest Port 1 View  Result Date: 08/04/2017 CLINICAL DATA:  Port placement EXAM: PORTABLE CHEST 1 VIEW COMPARISON:  Chest CT  07/27/2017 FINDINGS: Right upper extremity PICC with tip at the upper cavoatrial junction. Normal heart size. Stable aortic contours. There is no edema, consolidation, effusion, or pneumothorax. IMPRESSION: Right upper extremity PICC with tip at the upper cavoatrial junction. No evidence of active cardiopulmonary disease. Electronically Signed   By: Monte Fantasia M.D.   On: 08/04/2017 15:20    EKG: Independently reviewed.  Assessment/Plan Active Problems:   Pain in scapula   Left scapular fluid collection of uncertain etiology. CT chest showed  Large fluid collection  left chest wall most consistent with abscess  8 x 4 cm, and more superior subscapular component measures 5  x 3 cm.  White count 6.4 Lactic acid 2.47. CT of the chest as above. Received pain medication with some relief of the symptoms, IV Vanc and Zosyn  Surgery has seen the patient in consultation, he is not deemed at this time to the surgical candidate, due to the extent of the possible abscess and non septic status . Currently Afebrile.  Admit to Medsurg IP IVF Cont Vanc Zosyn Await cultures  Pain control Will obtain IR consult for drainage   History of recent sepsis due to UTI, in the setting of prostatic abscess on 07/26/2017, s/p  TURP At that time with un-roofing of prostatic abscess".Patient blood cultures showed staph aureus, MSSA, also has MSSA in the urine. Antibiotics were changed from Zosyn to Ancef. Initial blood cultures showed MSSA, elevated blood cultures have been negative for 3 days, received a PICC line, seen by Dr. Ola Spurr or he recommended IV Ancef 1 g IV every 8 hours until October 29". However,in view of current L subscapular abscess, will continue Vanc and Zosyn for now, will hold Ancef  Continues to have abdominal distention, hematuria. Afebrile. VSS . Able to urinate.  UA and cultures  pending Urology consulted  by EDP, awaiting input . Patient's main Urologist Dr. Jeffie Pollock   Hypertension BP 128/90    Pulse 74   Continue home anti-hypertensive medications   Hyperlipidemia Continue home statins   Type II Diabetes Current blood sugar level is 177 Lab Results  Component Value Date   HGBA1C 12.8 (H) 08/04/2017  Hold home oral diabetic medications.  Lantus , SSI  Known  RML nodule per CT, 0.8 mm Followed as OP by PCP   Deconditioning OT/PT evaluation   .   DVT prophylaxis:  SCD  Code Status:    Full  Family Communication:  Discussed with patient Disposition Plan: Expect patient to be discharged to home after condition improves Consults called:    Surgery per EDP. IR for drainage  Admission status:  IP medsurg    Scherry Laverne E, PA-C [facility]   08/04/2017, 5:46 PM

## 2017-08-05 ENCOUNTER — Ambulatory Visit: Payer: Self-pay | Admitting: Internal Medicine

## 2017-08-05 ENCOUNTER — Inpatient Hospital Stay (HOSPITAL_COMMUNITY): Payer: Self-pay

## 2017-08-05 DIAGNOSIS — L8990 Pressure ulcer of unspecified site, unspecified stage: Secondary | ICD-10-CM | POA: Insufficient documentation

## 2017-08-05 LAB — CBC
HCT: 28.5 % — ABNORMAL LOW (ref 39.0–52.0)
Hemoglobin: 9.7 g/dL — ABNORMAL LOW (ref 13.0–17.0)
MCH: 30 pg (ref 26.0–34.0)
MCHC: 34 g/dL (ref 30.0–36.0)
MCV: 88.2 fL (ref 78.0–100.0)
PLATELETS: 106 10*3/uL — AB (ref 150–400)
RBC: 3.23 MIL/uL — AB (ref 4.22–5.81)
RDW: 15.1 % (ref 11.5–15.5)
WBC: 5.4 10*3/uL (ref 4.0–10.5)

## 2017-08-05 LAB — COMPREHENSIVE METABOLIC PANEL
ALK PHOS: 105 U/L (ref 38–126)
ALT: 34 U/L (ref 17–63)
AST: 85 U/L — ABNORMAL HIGH (ref 15–41)
Albumin: 1.3 g/dL — ABNORMAL LOW (ref 3.5–5.0)
Anion gap: 3 — ABNORMAL LOW (ref 5–15)
BILIRUBIN TOTAL: 1.8 mg/dL — AB (ref 0.3–1.2)
BUN: <5 mg/dL — ABNORMAL LOW (ref 6–20)
CALCIUM: 7.5 mg/dL — AB (ref 8.9–10.3)
CO2: 27 mmol/L (ref 22–32)
CREATININE: 0.56 mg/dL — AB (ref 0.61–1.24)
Chloride: 102 mmol/L (ref 101–111)
Glucose, Bld: 109 mg/dL — ABNORMAL HIGH (ref 65–99)
Potassium: 3.4 mmol/L — ABNORMAL LOW (ref 3.5–5.1)
Sodium: 132 mmol/L — ABNORMAL LOW (ref 135–145)
TOTAL PROTEIN: 8 g/dL (ref 6.5–8.1)

## 2017-08-05 LAB — URINE CULTURE

## 2017-08-05 LAB — GLUCOSE, CAPILLARY
GLUCOSE-CAPILLARY: 130 mg/dL — AB (ref 65–99)
Glucose-Capillary: 55 mg/dL — ABNORMAL LOW (ref 65–99)
Glucose-Capillary: 90 mg/dL (ref 65–99)
Glucose-Capillary: 81 mg/dL (ref 65–99)
Glucose-Capillary: 183 mg/dL — ABNORMAL HIGH (ref 65–99)

## 2017-08-05 LAB — MRSA PCR SCREENING: MRSA BY PCR: NEGATIVE

## 2017-08-05 LAB — PROTIME-INR
INR: 1.32
PROTHROMBIN TIME: 16.3 s — AB (ref 11.4–15.2)

## 2017-08-05 MED ORDER — PRO-STAT SUGAR FREE PO LIQD
30.0000 mL | Freq: Two times a day (BID) | ORAL | Status: DC
  Administered 2017-08-05 – 2017-08-09 (×5): 30 mL via ORAL
  Filled 2017-08-05 (×10): qty 30

## 2017-08-05 MED ORDER — FENTANYL CITRATE (PF) 100 MCG/2ML IJ SOLN
INTRAMUSCULAR | Status: AC
  Filled 2017-08-05: qty 2

## 2017-08-05 MED ORDER — LIDOCAINE HCL (PF) 1 % IJ SOLN
INTRAMUSCULAR | Status: AC
  Filled 2017-08-05: qty 10

## 2017-08-05 MED ORDER — FENTANYL CITRATE (PF) 100 MCG/2ML IJ SOLN
INTRAMUSCULAR | Status: AC | PRN
  Administered 2017-08-05 (×2): 50 ug via INTRAVENOUS

## 2017-08-05 MED ORDER — MIDAZOLAM HCL 2 MG/2ML IJ SOLN
INTRAMUSCULAR | Status: AC | PRN
  Administered 2017-08-05 (×2): 1 mg via INTRAVENOUS

## 2017-08-05 MED ORDER — DEXTROSE 50 % IV SOLN
INTRAVENOUS | Status: AC
  Administered 2017-08-05: 25 mL
  Filled 2017-08-05: qty 50

## 2017-08-05 MED ORDER — SODIUM CHLORIDE 0.9% FLUSH
5.0000 mL | Freq: Three times a day (TID) | INTRAVENOUS | Status: DC
  Administered 2017-08-07 – 2017-08-11 (×7): 5 mL via INTRAVENOUS

## 2017-08-05 MED ORDER — MIDAZOLAM HCL 2 MG/2ML IJ SOLN
INTRAMUSCULAR | Status: AC
  Filled 2017-08-05: qty 2

## 2017-08-05 MED ORDER — ADULT MULTIVITAMIN W/MINERALS CH
1.0000 | ORAL_TABLET | Freq: Every day | ORAL | Status: DC
  Administered 2017-08-05 – 2017-08-13 (×9): 1 via ORAL
  Filled 2017-08-05 (×9): qty 1

## 2017-08-05 NOTE — Evaluation (Signed)
Physical Therapy Evaluation Patient Details Name: Cameron Coffey MRN: [id number] DOB: June 27, 1961 Today's Date: 08/05/2017   History of Present Illness  Pt is a 56 y/o M who presented with fever and chills and a burning sensation when urinating.  Pt was found to have a UTI and an elevated lactic acid level.  Blood sugar was also elevated and pt was dehydrated.  Code sepsis was called.  Pt with prostate abscess, pyelonephritis and pt is going for cystoscopy by urology and possible draining of abscess. noted that pt has R middle lobe nodule with possible metastasis, pending CT of the chest following urology procedure.  pt s/p drainage of fluid under L scapula 9/27.  Clinical Impression  Pt admitted with/for L scapular pain and sepstic.  Pt is at a min to min guard level for mobility at this time.  Pt currently limited functionally due to the problems listed below.  (see problems list.)  Pt will benefit from PT to maximize function and safety to be able to get home safely with available assist.     Follow Up Recommendations Home health PT    Equipment Recommendations  Rolling walker with 5" wheels    Recommendations for Other Services       Precautions / Restrictions Precautions Precautions: Fall Restrictions Weight Bearing Restrictions: No      Mobility  Bed Mobility Overal bed mobility: Needs Assistance Bed Mobility: Supine to Sit     Supine to sit: Min assist;HOB elevated (with rail)     General bed mobility comments: Used bed rail.  assisted to come forward and up.  Transfers Overall transfer level: Needs assistance Equipment used: Rolling walker (2 wheeled) Transfers: Sit to/from Stand Sit to Stand: Min guard         General transfer comment: cues for hand placement, pt wanting to pull up fully on the RW.  otherwise pt needed no assist scooting to edge or with stand.  Ambulation/Gait Ambulation/Gait assistance: Min guard Ambulation Distance (Feet): 170  Feet Assistive device: Rolling walker (2 wheeled) Gait Pattern/deviations: Step-through pattern Gait velocity: decreased Gait velocity interpretation: Below normal speed for age/gender General Gait Details: generally steady, a little guarded.  Probably does not need RW in a home-like environment  Stairs            Wheelchair Mobility    Modified Rankin (Stroke Patients Only)       Balance Overall balance assessment: Needs assistance Sitting-balance support: No upper extremity supported;Feet supported Sitting balance-Leahy Scale: Good Sitting balance - Comments: reports increasing discomfort the long he is in siting position. Improves with standing and walking.    Standing balance support: Bilateral upper extremity supported;During functional activity Standing balance-Leahy Scale: Fair Standing balance comment: Pt able to stand statically without UE support but relies on RW for dynamic activities. Pt requesting to use rw..  Pt able to carry a cup of coffee in one hand, cruising furniture otheerwise                             Pertinent Vitals/Pain Pain Assessment: Faces Faces Pain Scale: Hurts little more Pain Location: left flank Pain Descriptors / Indicators: Sore (but improving) Pain Intervention(s): Monitored during session    Home Living Family/patient expects to be discharged to:: Private residence Living Arrangements: Parent Available Help at Discharge: Other (Comment) (no family/friends that can provide assist) Type of Home: House Home Access: Stairs to enter Entrance Stairs-Rails: Doctor, general practice  of Steps: 2 Home Layout: One level Home Equipment: Cane - single point;Grab bars - tub/shower      Prior Function Level of Independence: Needs assistance   Gait / Transfers Assistance Needed: Pt was ambulating with wooden cane short household distances, limited due to fatigue.  He denies any falls in the past 6 months.    ADL's /  Homemaking Assistance Needed: From 07/26/17 admission: Pt was not changing clothes as he was unable to and did not have assist.  Pt was independently showering.  Mother was doing the cooking. Pt no longer driving, family or friends assist with this.          Hand Dominance   Dominant Hand: Right    Extremity/Trunk Assessment   Upper Extremity Assessment Upper Extremity Assessment: Defer to OT evaluation    Lower Extremity Assessment Lower Extremity Assessment: Overall WFL for tasks assessed;RLE deficits/detail;LLE deficits/detail RLE Deficits / Details: mild weakness hip flexors and hams, edematous distally, functional LLE Deficits / Details: general weakness, hip flexors, hams,df; weaker than R LE, more edematous and sensative. LLE Coordination: decreased fine motor    Cervical / Trunk Assessment Cervical / Trunk Assessment:  (edema noted around L scapular area)  Communication   Communication: No difficulties  Cognition Arousal/Alertness: Awake/alert Behavior During Therapy: WFL for tasks assessed/performed Overall Cognitive Status: Within Functional Limits for tasks assessed                                        General Comments      Exercises     Assessment/Plan    PT Assessment Patient needs continued PT services  PT Problem List Decreased strength;Decreased activity tolerance;Decreased balance;Decreased mobility;Pain;Decreased knowledge of use of DME       PT Treatment Interventions DME instruction;Gait training;Stair training;Therapeutic activities;Therapeutic exercise;Balance training;Patient/family education    PT Goals (Current goals can be found in the Care Plan section)  Acute Rehab PT Goals Patient Stated Goal: not stated PT Goal Formulation: With patient Time For Goal Achievement: 08/12/17 Potential to Achieve Goals: Fair    Frequency Min 3X/week   Barriers to discharge        Co-evaluation               AM-PAC PT "6  Clicks" Daily Activity  Outcome Measure Difficulty turning over in bed (including adjusting bedclothes, sheets and blankets)?: A Little Difficulty moving from lying on back to sitting on the side of the bed? : Unable Difficulty sitting down on and standing up from a chair with arms (e.g., wheelchair, bedside commode, etc,.)?: A Little Help needed moving to and from a bed to chair (including a wheelchair)?: A Little Help needed walking in hospital room?: A Little Help needed climbing 3-5 steps with a railing? : A Little 6 Click Score: 16    End of Session   Activity Tolerance: Patient tolerated treatment well Patient left: in bed;Other (comment);with bed alarm set;with call bell/phone within reach (sitting EOB) Nurse Communication: Mobility status PT Visit Diagnosis: Muscle weakness (generalized) (M62.81);Other abnormalities of gait and mobility (R26.89);Unsteadiness on feet (R26.81)    Time: 1610-9604 PT Time Calculation (min) (ACUTE ONLY): 24 min   Charges:   PT Evaluation $PT Eval Moderate Complexity: 1 Mod PT Treatments $Gait Training: 8-22 mins   PT G Codes:        08-08-17   Bing, PT [phone]  (pager)  Eliseo Gum Socrates Cahoon 08/05/2017, 6:07 PM

## 2017-08-05 NOTE — Progress Notes (Signed)
CRITICAL VALUE ALERT  Critical Value:  CBG 55 Date & Time Notied:  08/05/2017 0808 Provider Notified: Dr Cena Benton  Orders Received/Actions taken:  Paged. Awaiting call back

## 2017-08-05 NOTE — Consult Note (Addendum)
H&P Physician requesting consult: Cameron Coffey  Chief Complaint: history of prostatic abscess  History of Present Illness: this is a 56 year old male with a history of prostatic abscess who recently underwent a transurethral unroofing of prostatic abscess by Dr. Jeffie Pollock on 07/26/2017. He has since had his Foley catheter removed. He is now readmitted with a scapular abscess status post drainage today. The patient has no complaints urologically. He denies any hematuria or dysuria. He feels like he is voiding well and emptying his bladder. He denies any current urological pain.  Past Medical History:  Diagnosis Date  . Heart murmur    "born w/one"  . Type II diabetes mellitus ([place])    Past Surgical History:  Procedure Laterality Date  . TONSILLECTOMY  1982  . TRANSURETHRAL RESECTION OF PROSTATE N/A 07/26/2017   Procedure: TRANSURETHRAL RESECTION OF THE PROSTATE (TURP) WITH UPROOFING OF PROSTATE ABSCESS;  Surgeon: Irine Seal, MD;  Location: [facility] ORS;  Service: Urology;  Laterality: N/A;    Home Medications:  Prescriptions Prior to Admission  Medication Sig Dispense Refill Last Dose  . acetaminophen (TYLENOL) 325 MG tablet Take 325 mg by mouth every 6 (six) hours as needed for mild pain.   Past Month at Unknown time  . glipiZIDE (GLUCOTROL) 10 MG tablet Take 1 tablet (10 mg total) by mouth 2 (two) times daily before a meal. 60 tablet 0 08/04/2017 at Unknown time  . lisinopril (PRINIVIL,ZESTRIL) 5 MG tablet Take 1 tablet (5 mg total) by mouth daily. 30 tablet 0 08/04/2017 at Unknown time  . metFORMIN (GLUCOPHAGE) 1000 MG tablet Take 1 tablet (1,000 mg total) by mouth 2 (two) times daily. 60 tablet 0 08/04/2017 at Unknown time  . simvastatin (ZOCOR) 40 MG tablet Take 1 tablet (40 mg total) by mouth daily. 30 tablet 0 08/04/2017 at Unknown time  . ceFAZolin (ANCEF) IVPB Inject 1 g into the vein every 8 (eight) hours. Indication:  MSSA bacteremia Last Day of Therapy: october 29th Labs - Once weekly:   CBC/D and BMP, Labs - Every other week:  ESR and CRP 39 Units 0 Taking  . insulin aspart (NOVOLOG FLEXPEN) 100 UNIT/ML FlexPen Inject 5 units subcutaneously before meals and and bedtime for DM CBG > or = 150.  Notify MD for CBG < 70 or > 500   Taking  . insulin glargine (LANTUS) 100 UNIT/ML injection Inject 0.14 mLs (14 Units total) into the skin at bedtime. 10 mL 11 Taking  . metoprolol tartrate (LOPRESSOR) 25 MG tablet Take 0.5 tablets (12.5 mg total) by mouth 2 (two) times daily. 60 tablet 0 Taking  . oxyCODONE (ROXICODONE) 5 MG immediate release tablet Take 1 tablet (5 mg total) by mouth every 4 (four) hours as needed for severe pain. 100 tablet 0    Allergies: No Known Allergies  Family History  Problem Relation Age of Onset  . Hyperlipidemia Mother   . Diabetes Mother   . Hyperlipidemia Father   . Diabetes Father    Social History:  reports that he has been smoking Cigarettes.  He has a 16.00 pack-year smoking history. He has never used smokeless tobacco. He reports that he drinks about 12.6 oz of alcohol per week . He reports that he uses drugs, including Cocaine.  ROS: A complete review of systems was performed.  All systems are negative except for pertinent findings as noted. ROS   Physical Exam:  Vital signs in last 24 hours: Temp:  [98.2 F (36.8 C)-98.8 F (37.1 C)] 98.2 F (  36.8 C) (09/27 1718) Pulse Rate:  [63-74] 63 (09/27 1718) Resp:  [11-19] 16 (09/27 1718) BP: (102-124)/(70-89) 123/78 (09/27 1718) SpO2:  [98 %-100 %] 100 % (09/27 1718)  General:  Alert and oriented, No acute distress HEENT: Normocephalic, atraumatic Cardiovascular: adequate peripheral perfusion Lungs: Regular rate and effort Abdomen: Soft, nontender, nondistended, no abdominal masses Back: No CVA tenderness. There is a drain was placed in the scapular area Extremities: No edema Neurologic: Grossly intact  Laboratory Data:  Results for orders placed or performed during the hospital  encounter of 08/04/17 (from the past 24 hour(s))  MRSA PCR Screening     Status: None   Collection Time: 08/04/17  6:56 PM  Result Value Ref Range   MRSA by PCR NEGATIVE NEGATIVE  Glucose, capillary     Status: Abnormal   Collection Time: 08/04/17  9:28 PM  Result Value Ref Range   Glucose-Capillary 224 (H) 65 - 99 mg/dL  Comprehensive metabolic panel     Status: Abnormal   Collection Time: 08/05/17  4:59 AM  Result Value Ref Range   Sodium 132 (L) 135 - 145 mmol/L   Potassium 3.4 (L) 3.5 - 5.1 mmol/L   Chloride 102 101 - 111 mmol/L   CO2 27 22 - 32 mmol/L   Glucose, Bld 109 (H) 65 - 99 mg/dL   BUN <5 (L) 6 - 20 mg/dL   Creatinine, Ser 0.56 (L) 0.61 - 1.24 mg/dL   Calcium 7.5 (L) 8.9 - 10.3 mg/dL   Total Protein 8.0 6.5 - 8.1 g/dL   Albumin 1.3 (L) 3.5 - 5.0 g/dL   AST 85 (H) 15 - 41 U/L   ALT 34 17 - 63 U/L   Alkaline Phosphatase 105 38 - 126 U/L   Total Bilirubin 1.8 (H) 0.3 - 1.2 mg/dL   GFR calc non Af Amer >60 >60 mL/min   GFR calc Af Amer >60 >60 mL/min   Anion gap 3 (L) 5 - 15  CBC     Status: Abnormal   Collection Time: 08/05/17  4:59 AM  Result Value Ref Range   WBC 5.4 4.0 - 10.5 K/uL   RBC 3.23 (L) 4.22 - 5.81 MIL/uL   Hemoglobin 9.7 (L) 13.0 - 17.0 g/dL   HCT 28.5 (L) 39.0 - 52.0 %   MCV 88.2 78.0 - 100.0 fL   MCH 30.0 26.0 - 34.0 pg   MCHC 34.0 30.0 - 36.0 g/dL   RDW 15.1 11.5 - 15.5 %   Platelets 106 (L) 150 - 400 K/uL  Protime-INR     Status: Abnormal   Collection Time: 08/05/17  4:59 AM  Result Value Ref Range   Prothrombin Time 16.3 (H) 11.4 - 15.2 seconds   INR 1.32   Glucose, capillary     Status: Abnormal   Collection Time: 08/05/17  7:45 AM  Result Value Ref Range   Glucose-Capillary 55 (L) 65 - 99 mg/dL  Glucose, capillary     Status: None   Collection Time: 08/05/17  8:23 AM  Result Value Ref Range   Glucose-Capillary 90 65 - 99 mg/dL  Glucose, capillary     Status: Abnormal   Collection Time: 08/05/17 12:27 PM  Result Value Ref Range    Glucose-Capillary 130 (H) 65 - 99 mg/dL  Glucose, capillary     Status: None   Collection Time: 08/05/17  5:16 PM  Result Value Ref Range   Glucose-Capillary 81 65 - 99 mg/dL   Recent Results (from the  past 240 hour(s))  Blood culture (routine x 2)     Status: None (Preliminary result)   Collection Time: 08/04/17 11:45 AM  Result Value Ref Range Status   Specimen Description BLOOD LEFT ANTECUBITAL  Final   Special Requests   Final    BOTTLES DRAWN AEROBIC AND ANAEROBIC Blood Culture adequate volume   Culture NO GROWTH 1 DAY  Final   Report Status PENDING  Incomplete  Blood culture (routine x 2)     Status: None (Preliminary result)   Collection Time: 08/04/17 11:52 AM  Result Value Ref Range Status   Specimen Description BLOOD LEFT WRIST  Final   Special Requests   Final    BOTTLES DRAWN AEROBIC AND ANAEROBIC Blood Culture adequate volume   Culture NO GROWTH 1 DAY  Final   Report Status PENDING  Incomplete  Urine culture     Status: Abnormal   Collection Time: 08/04/17  5:07 PM  Result Value Ref Range Status   Specimen Description URINE, CLEAN CATCH  Final   Special Requests NONE  Final   Culture >=100,000 COLONIES/mL YEAST (A)  Final   Report Status 08/05/2017 FINAL  Final  MRSA PCR Screening     Status: None   Collection Time: 08/04/17  6:56 PM  Result Value Ref Range Status   MRSA by PCR NEGATIVE NEGATIVE Final    Comment:        The GeneXpert MRSA Assay (FDA approved for NASAL specimens only), is one component of a comprehensive MRSA colonization surveillance program. It is not intended to diagnose MRSA infection nor to guide or monitor treatment for MRSA infections.    Creatinine:  Recent Labs  07/31/17 08/04/17 1145 08/04/17 1206 08/05/17 0459  CREATININE 0.5* 0.67 0.50* 0.56*    Impression/Assessment:  history of prostate abscess, resolved  Plan:  Do not recomend Foley catheter at this time. if he begins to have voiding complaints or mild retention  of urine, he can be started on Flomax 0.4 mg daily. No Urological intervention recommended.  Marton Redwood, III 08/05/2017, 6:22 PM

## 2017-08-05 NOTE — Progress Notes (Signed)
PROGRESS NOTE    Cameron PITKIN  Coffey:[id number] DOB: 1961/08/27 DOA: 08/04/2017 PCP: Lorre Munroe, NP    Brief Narrative:  Cameron Coffey is a 56 y.o. male who presents with subscapular abscess that will likely need IR for drainage. IR consult placed. Continue current ABX regimen. Additionally Urology to see pt due to persistent pain from recent TURP.   Assessment & Plan:     Abscess of scapular region - IR was consulted and patient is status post ultrasound guided drainage of abscess. - Will follow-up with culture results for now continue vancomycin and Zosyn.  Prostatic abscess - consulted Urology. Pt is currently on Vancomycin and Zosyn. Pt is s/p TURP with unroofing of prostatic abscess per my notes. Blood cultures at that time grew staph aureus, patient had PICC line was seen by Dr. Sampson Goon who recommended IV Ancef every 8 hours until October 29  Active Problems:   Essential hypertension - Patient is on lisinopril -Patient is on metoprolol    Dyslipidemia associated with type 2 diabetes mellitus ([facility]) -  Pt is on Statin    Type II diabetes with long term use of insulin ([facility]) - Pt is on carb modified diet.     MSSA (methicillin susceptible Staphylococcus aureus) septicemia ([facility]) - Pt on vancomycin    Acute bilateral back pain - secondary to abscess - continue supportive therapy. Pt is s/p procedure listed above and below    Constipation, chronic - stable on dulcolax    Bilateral nephrolithiasis  DVT prophylaxis: SCD's Code Status: Full Family Communication: None at bedside.  Disposition Plan: pending improvement in condition   Consultants:   Radiology  General surgery   Procedures: US guided drainage of left posterior back/shoulder abscess  Antimicrobials: Zosyn, Vancomycin  Subjective: Pt has no new complaints. No acute issues overnight.  Objective: Vitals:   08/05/17 1526 08/05/17 1530 08/05/17 1542 08/05/17 1614  BP: 108/78 117/81 107/75  108/70  Pulse: 65 68 72 71  Resp: Temp:    98.5 F (36.9 C)  TempSrc:    Oral  SpO2: 100% 100% 100% 100%  Weight:      Height:        Intake/Output Summary (Last 24 hours) at 08/05/17 1718 Last data filed at 08/05/17 1615  Gross per 24 hour  Intake          2796.66 ml  Output             1730 ml  Net          1066.66 ml   Filed Weights   08/04/17 0956 08/04/17 1757  Weight: 68 kg (150 lb) 76.2 kg (168 lb)    Examination:  General exam: Appears calm and comfortable, in nad.   Respiratory system: Clear to auscultation. Respiratory effort normal. Equal chest rise. Cardiovascular system: S1 & S2 heard, RRR. No JVD, murmurs, rubs, gallops or clicks Gastrointestinal system: Abdomen is nondistended, soft and nontender. No organomegaly or masses felt. Normal bowel sounds heard. Central nervous system: Alert and oriented. No focal neurological deficits. Extremities: Symmetric 5 x 5 power. Skin: No rashes or ulcers, warm and dry Psychiatry:  Mood & affect appropriate.     Data Reviewed: I have personally reviewed following labs and imaging studies  CBC:  Recent Labs Lab 08/04/17 1145 08/04/17 1206 08/05/17 0459  WBC 6.4  --  5.4  NEUTROABS 4.1  --   --   HGB 10.4* 12.6* 9.7*  HCT  29.7* 37.0* 28.5*  MCV 88.1  --  88.2  PLT 103*  --  106*   Basic Metabolic Panel:  Recent Labs Lab 07/31/17 08/04/17 1145 08/04/17 1206 08/05/17 0459  NA 134* 131* 137 132*  K 3.9 3.9 4.0 3.4*  CL  --  101 98* 102  CO2  --  27  --  27  GLUCOSE  --  121* 123* 109*  BUN 5 5* 6 <5*  CREATININE 0.5* 0.67 0.50* 0.56*  CALCIUM  --  7.9*  --  7.5*   GFR: Estimated Creatinine Clearance: 104.3 mL/min (A) (by C-G formula based on SCr of 0.56 mg/dL (L)). Liver Function Tests:  Recent Labs Lab 07/31/17 08/04/17 1145 08/05/17 0459  AST 130* 92* 85*  ALT 60* 36 34  ALKPHOS 183* 106 105  BILITOT  --  1.9* 1.8*  PROT  --  9.0* 8.0  ALBUMIN  --  1.5* 1.3*   No results for  input(s): LIPASE, AMYLASE in the last 168 hours. No results for input(s): AMMONIA in the last 168 hours. Coagulation Profile:  Recent Labs Lab 08/05/17 0459  INR 1.32   Cardiac Enzymes: No results for input(s): CKTOTAL, CKMB, CKMBINDEX, TROPONINI in the last 168 hours. BNP (last 3 results) No results for input(s): PROBNP in the last 8760 hours. HbA1C:  Recent Labs  08/04/17 1145  HGBA1C 12.8*   CBG:  Recent Labs Lab 08/04/17 2128 08/05/17 0745 08/05/17 0823 08/05/17 1227 08/05/17 1716  GLUCAP 224* 55* 90 130* 81   Lipid Profile: No results for input(s): CHOL, HDL, LDLCALC, TRIG, CHOLHDL, LDLDIRECT in the last 72 hours. Thyroid Function Tests: No results for input(s): TSH, T4TOTAL, FREET4, T3FREE, THYROIDAB in the last 72 hours. Anemia Panel: No results for input(s): VITAMINB12, FOLATE, FERRITIN, TIBC, IRON, RETICCTPCT in the last 72 hours. Sepsis Labs:  Recent Labs Lab 08/04/17 1206 08/04/17 1616  LATICACIDVEN 2.47* 2.65*    Recent Results (from the past 240 hour(s))  Blood culture (routine x 2)     Status: None (Preliminary result)   Collection Time: 08/04/17 11:45 AM  Result Value Ref Range Status   Specimen Description BLOOD LEFT ANTECUBITAL  Final   Special Requests   Final    BOTTLES DRAWN AEROBIC AND ANAEROBIC Blood Culture adequate volume   Culture NO GROWTH 1 DAY  Final   Report Status PENDING  Incomplete  Blood culture (routine x 2)     Status: None (Preliminary result)   Collection Time: 08/04/17 11:52 AM  Result Value Ref Range Status   Specimen Description BLOOD LEFT WRIST  Final   Special Requests   Final    BOTTLES DRAWN AEROBIC AND ANAEROBIC Blood Culture adequate volume   Culture NO GROWTH 1 DAY  Final   Report Status PENDING  Incomplete  Urine culture     Status: Abnormal   Collection Time: 08/04/17  5:07 PM  Result Value Ref Range Status   Specimen Description URINE, CLEAN CATCH  Final   Special Requests NONE  Final   Culture  >=100,000 COLONIES/mL YEAST (A)  Final   Report Status 08/05/2017 FINAL  Final  MRSA PCR Screening     Status: None   Collection Time: 08/04/17  6:56 PM  Result Value Ref Range Status   MRSA by PCR NEGATIVE NEGATIVE Final    Comment:        The GeneXpert MRSA Assay (FDA approved for NASAL specimens only), is one component of a comprehensive MRSA colonization surveillance program. It  is not intended to diagnose MRSA infection nor to guide or monitor treatment for MRSA infections.          Radiology Studies: Ct Chest W Contrast  Result Date: 08/04/2017 CLINICAL DATA:  Left-sided chest wall pain. EXAM: CT CHEST WITH CONTRAST TECHNIQUE: Multidetector CT imaging of the chest was performed during intravenous contrast administration. CONTRAST:  75mL ISOVUE-300 IOPAMIDOL (ISOVUE-300) INJECTION 61% COMPARISON:  Radiograph of same day.  CT scan of July 27, 2017. FINDINGS: Cardiovascular: No significant vascular findings. Normal heart size. No pericardial effusion. 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 Mediastinum/Nodes: No enlarged mediastinal, hilar, or axillary lymph nodes. Thyroid gland, trachea, and esophagus demonstrate no significant findings. Lungs/Pleura: No pneumothorax is noted. Minimal left pleural effusion is noted with adjacent subsegmental atelectasis. Stable 8 mm subpleural nodule is noted anteriorly in right middle lobe best seen on image number 87 of series 7. Upper Abdomen: Hepatic cirrhosis is noted. Moderate ascites is noted in visualized portion of upper abdomen. Possible lesion is noted anteriorly in left hepatic lobe. Musculoskeletal: No fracture is noted. Large fluid collection is seen tracking along the lateral and posterior portion of the left chest wall most consistent with abscess. Subscapular component measures 5.3 x 2.8 cm. Posterior component measures 10.6 x 1.6 cm. Inferior and lateral component measures 8.2 x 3.6 cm.  IMPRESSION: Large fluid collection is seen tracking along the lateral and posterior portion of the left chest wall most consistent with abscess. Its inferior component measures 8.2 x 3.6 cm, and more superior subscapular component measures 5.3 x 2.8 cm. Stable 8 mm right middle lobe nodule is noted. Non-contrast chest CT at 6-12 months is recommended. If the nodule is stable at time of repeat CT, then future CT at 18-24 months (from today's scan) is considered optional for low-risk patients, but is recommended for high-risk patients. This recommendation follows the consensus statement: Guidelines for Management of Incidental Pulmonary Nodules Detected on CT Images: From the Fleischner Society 2017; Radiology 2017; 284:228-243. Minimal left pleural effusion is noted with adjacent subsegmental atelectasis. Hepatic cirrhosis is noted with moderate ascites. Possible partially exophytic lesion is seen arising from left hepatic lobe; MRI of the liver with and without gadolinium is recommended on nonemergent basis to rule out neoplasm. Electronically Signed   By: Lupita Raider, M.D.   On: 08/04/2017 15:56   Dg Chest Port 1 View  Result Date: 08/04/2017 CLINICAL DATA:  Port placement EXAM: PORTABLE CHEST 1 VIEW COMPARISON:  Chest CT 07/27/2017 FINDINGS: Right upper extremity PICC with tip at the upper cavoatrial junction. Normal heart size. Stable aortic contours. There is no edema, consolidation, effusion, or pneumothorax. IMPRESSION: Right upper extremity PICC with tip at the upper cavoatrial junction. No evidence of active cardiopulmonary disease. Electronically Signed   By: Marnee Spring M.D.   On: 08/04/2017 15:20        Scheduled Meds: . feeding supplement (PRO-STAT SUGAR FREE 64)  30 mL Oral BID  . fentaNYL      . insulin aspart  0-9 Units Subcutaneous TID WC  . insulin glargine  14 Units Subcutaneous QHS  . lidocaine (PF)      . lidocaine-EPINEPHrine  20 mL Intradermal Once  . lisinopril  5 mg  Oral Daily  . metoprolol tartrate  12.5 mg Oral BID  . midazolam      . multivitamin with minerals  1 tablet Oral Daily  . simvastatin  40 mg Oral Daily  . sodium chloride flush  5 mL Intravenous Q8H   Continuous Infusions: . sodium chloride 100 mL/hr at 08/05/17 0228  . piperacillin-tazobactam (ZOSYN)  IV 3.375 g (08/05/17 1330)  . vancomycin Stopped (08/05/17 1052)     LOS: 1 day    Time spent: > 35 minutes  Penny Pia, MD [facility] Pager [phone]  If 7PM-7AM, please contact night-coverage [email or web address] Password [email or web address] 08/05/2017, 5:18 PM

## 2017-08-05 NOTE — Sedation Documentation (Signed)
Patient is resting comfortably. 

## 2017-08-05 NOTE — Procedures (Signed)
Interventional Radiology Procedure Note  Procedure: US guided drainage of left posterior back/shoulder abscess.  ~50cc purulent, blood fluid aspirated.  Very dense capsule upon entry.  Cx sent. 76f drain to gravity.    Complications: None  Recommendations:  - Follow up culture - Routine drain care - Record daily output.   - Do not submerge  - Routine wound care   Signed,  Yvone Neu. Loreta Ave, DO

## 2017-08-05 NOTE — Progress Notes (Signed)
Subjective: Patient is alert.  Friendly.  No distress.  Says he is hungry and wants to eat. Denies pain No fever. He looks pretty good. WBC 5400.Marland Kitchen  Hemoglobin variation this morning 9.7.  Was 12.6 yesterday.  No clinical evidence of any bleeding Lactic acid was up a little bit yesterday, I am not sure why. Objective: Vital signs in last 24 hours: Temp:  [98.2 F (36.8 C)-98.8 F (37.1 C)] 98.5 F (36.9 C) (09/27 0514) Pulse Rate:  [68-92] 68 (09/27 0514) Resp:  [16-18] 18 (09/27 0514) BP: (115-132)/(72-93) 119/81 (09/27 0514) SpO2:  [100 %] 100 % (09/27 0514) Weight:  [68 kg (150 lb)-76.2 kg (168 lb)] 76.2 kg (168 lb) (09/26 1757) Last BM Date: 08/04/17  Intake/Output from previous day: 09/26 0701 - 09/27 0700 In: 1438.3 [I.V.:1388.3; IV Piggyback:50] Out: 1380 [Urine:1380] Intake/Output this shift: Total I/O In: 1388.3 [I.V.:1388.3] Out: 980 [Urine:980]  General appearance: alert.  Friendly.  Oriented.  Mental status normal.  No distress. Chest wall: no tenderness, welling left chest wall at lower scapular tip nontender.  Skin healthy.  Lab Results:   Recent Labs  08/04/17 1145 08/04/17 1206  WBC 6.4  --   HGB 10.4* 12.6*  HCT 29.7* 37.0*  PLT 103*  --    BMET  Recent Labs  08/04/17 1145 08/04/17 1206  NA 131* 137  K 3.9 4.0  CL 101 98*  CO2 27  --   GLUCOSE 121* 123*  BUN 5* 6  CREATININE 0.67 0.50*  CALCIUM 7.9*  --    PT/INR  Recent Labs  08/05/17 0459  LABPROT 16.3*  INR 1.32   ABG No results for input(s): PHART, HCO3 in the last 72 hours.  Invalid input(s): PCO2, PO2  Studies/Results: Ct Chest W Contrast  Result Date: 08/04/2017 CLINICAL DATA:  Left-sided chest wall pain. EXAM: CT CHEST WITH CONTRAST TECHNIQUE: Multidetector CT imaging of the chest was performed during intravenous contrast administration. CONTRAST:  75mL ISOVUE-300 IOPAMIDOL (ISOVUE-300) INJECTION 61% COMPARISON:  Radiograph of same day.  CT scan of July 27, 2017. FINDINGS: Cardiovascular: No significant vascular findings. Normal heart size. No pericardial effusion. 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 Mediastinum/Nodes: No enlarged mediastinal, hilar, or axillary lymph nodes. Thyroid gland, trachea, and esophagus demonstrate no significant findings. Lungs/Pleura: No pneumothorax is noted. Minimal left pleural effusion is noted with adjacent subsegmental atelectasis. Stable 8 mm subpleural nodule is noted anteriorly in right middle lobe best seen on image number 87 of series 7. Upper Abdomen: Hepatic cirrhosis is noted. Moderate ascites is noted in visualized portion of upper abdomen. Possible lesion is noted anteriorly in left hepatic lobe. Musculoskeletal: No fracture is noted. Large fluid collection is seen tracking along the lateral and posterior portion of the left chest wall most consistent with abscess. Subscapular component measures 5.3 x 2.8 cm. Posterior component measures 10.6 x 1.6 cm. Inferior and lateral component measures 8.2 x 3.6 cm. IMPRESSION: Large fluid collection is seen tracking along the lateral and posterior portion of the left chest wall most consistent with abscess. Its inferior component measures 8.2 x 3.6 cm, and more superior subscapular component measures 5.3 x 2.8 cm. Stable 8 mm right middle lobe nodule is noted. Non-contrast chest CT at 6-12 months is recommended. If the nodule is stable at time of repeat CT, then future CT at 18-24 months (from today's scan) is considered optional for low-risk patients, but is recommended for high-risk patients. This recommendation follows the consensus statement: Guidelines  for Management of Incidental Pulmonary Nodules Detected on CT Images: From the Fleischner Society 2017; Radiology 2017; [phone]. Minimal left pleural effusion is noted with adjacent subsegmental atelectasis. Hepatic cirrhosis is noted with moderate ascites. Possible partially exophytic  lesion is seen arising from left hepatic lobe; MRI of the liver with and without gadolinium is recommended on nonemergent basis to rule out neoplasm. Electronically Signed   By: Lupita Raider, M.D.   On: 08/04/2017 15:56   Dg Chest Port 1 View  Result Date: 08/04/2017 CLINICAL DATA:  Port placement EXAM: PORTABLE CHEST 1 VIEW COMPARISON:  Chest CT 07/27/2017 FINDINGS: Right upper extremity PICC with tip at the upper cavoatrial junction. Normal heart size. Stable aortic contours. There is no edema, consolidation, effusion, or pneumothorax. IMPRESSION: Right upper extremity PICC with tip at the upper cavoatrial junction. No evidence of active cardiopulmonary disease. Electronically Signed   By: Marnee Spring M.D.   On: 08/04/2017 15:20    Anti-infectives: Anti-infectives    Start     Dose/Rate Route Frequency Ordered Stop   08/05/17 0200  vancomycin (VANCOCIN) IVPB 750 mg/150 ml premix     750 mg 150 mL/hr over 60 Minutes Intravenous Every 8 hours 08/04/17 1549     08/04/17 2200  piperacillin-tazobactam (ZOSYN) IVPB 3.375 g     3.375 g 12.5 mL/hr over 240 Minutes Intravenous Every 8 hours 08/04/17 1549     08/04/17 1445  vancomycin (VANCOCIN) 1,250 mg in sodium chloride 0.9 % 250 mL IVPB     1,250 mg 166.7 mL/hr over 90 Minutes Intravenous  Once 08/04/17 1434 08/04/17 1835   08/04/17 1445  piperacillin-tazobactam (ZOSYN) IVPB 3.375 g     3.375 g 100 mL/hr over 30 Minutes Intravenous  Once 08/04/17 1434 08/04/17 1627      Assessment/Plan:   Fluid collection left chest wall below scapular tip and second collection deep scapular body.  Etiology unclear Although CT is read as possible abscesses or not rim-enhancing and clinically do not behave like an  abscess    I think that these fluid collection should be drained for diagnostic purposes.  I think that the least morbid way to do this is to have interventional radiology drain both collections.  This would afford incision and exploration  behind the scapula.  He is in favor of this approach,     IR to be consulted today.      Consider orthopedic consult, although I am not convinced that there is anything wrong with the scapula itself     Please reconsult [facility] Problems Arise   Ascites Anemia Insulin-dependent diabetes Hypertension Recent sepsis due to UTI in the setting of prostate abscess.  Recent TURP to drain   LOS: 1 day    Liberty Stead M 08/05/2017

## 2017-08-05 NOTE — Evaluation (Signed)
Occupational Therapy Evaluation Patient Details Name: Cameron Coffey MRN: [id number] DOB: 1961/11/06 Today's Date: 08/05/2017    History of Present Illness Pt is a 56 y/o M who presented with fever and chills and a burning sensation when urinating.  Pt was found to have a UTI and an elevated lactic acid level.  Blood sugar was also elevated and pt was dehydrated.  Code sepsis was called.  Pt with prostate abscess, pyelonephritis and pt is going for cystoscopy by urology and possible draining of abscess. noted that pt has R middle lobe nodule with possible metastasis, pending CT of the chest following urology procedure.     Clinical Impression   Pt admitted with the above diagnoses and presents with below problem list. Pt will benefit from continued acute OT to address the below listed deficits and maximize independence with basic ADLs prior to d/c to venue below. PTA pt was mostly mod I with ADLs. Pt is currently min guard to min A with most ADLs. Pt asking to walk in the hall this session. BLE edema noted mostly ankle and distal, also L scapular area edema.      Follow Up Recommendations  Home health OT    Equipment Recommendations  3 in 1 bedside commode    Recommendations for Other Services PT consult     Precautions / Restrictions Precautions Precautions: Fall Restrictions Weight Bearing Restrictions: No      Mobility Bed Mobility Overal bed mobility: Needs Assistance Bed Mobility: Supine to Sit     Supine to sit: Min assist;HOB elevated     General bed mobility comments: Used bed rail and therapist arm to power up to EOB. He reports he does fine with this with the bed that he has at home. Discussed pillows to boost HOB height at home  Transfers Overall transfer level: Needs assistance Equipment used: Rolling walker (2 wheeled) Transfers: Sit to/from Stand Sit to Stand: Min assist;Min guard         General transfer comment: Pt pulling up with both hands on rw to  stand; assist to stabilize rw. Cues needed for hand placement and position of rw. From EOB to recliner.    Balance Overall balance assessment: Needs assistance Sitting-balance support: No upper extremity supported;Feet supported Sitting balance-Leahy Scale: Good Sitting balance - Comments: reports increasing discomfort the long he is in siting position. Improves with standing and walking.    Standing balance support: Bilateral upper extremity supported;During functional activity Standing balance-Leahy Scale: Fair Standing balance comment: Pt able to stand statically without UE support but relies on RW for dynamic activities. Pt requesting to use rw.                           ADL either performed or assessed with clinical judgement   ADL Overall ADL's : Needs assistance/impaired Eating/Feeding: Set up;Sitting   Grooming: Set up;Sitting   Upper Body Bathing: Sitting;Minimal assistance   Lower Body Bathing: Min guard;Minimal assistance;Sit to/from stand   Upper Body Dressing : Sitting;Minimal assistance   Lower Body Dressing: Minimal assistance;Sit to/from stand;Min guard   Toilet Transfer: Min guard;Minimal assistance;Ambulation;RW;Comfort height toilet   Toileting- Clothing Manipulation and Hygiene: Min guard;Minimal assistance;Sit to/from stand   Tub/ Shower Transfer: Tub transfer;Ambulation;3 in 1;Rolling walker;Minimal assistance   Functional mobility during ADLs: Min guard;Rolling walker General ADL Comments: Pt completed bed mobility and household distance functional mobility. Pt eager to walk in halls.      Vision  Perception     Praxis      Pertinent Vitals/Pain Pain Assessment: Faces Faces Pain Scale: Hurts little more Pain Intervention(s): Monitored during session;Repositioned     Hand Dominance Right   Extremity/Trunk Assessment Upper Extremity Assessment Upper Extremity Assessment: Generalized weakness;LUE deficits/detail LUE  Deficits / Details: edema in area of L scapula likely limiting ROM.   Lower Extremity Assessment Lower Extremity Assessment: Defer to PT evaluation   Cervical / Trunk Assessment Cervical / Trunk Assessment:  (edema noted around L scapular area)   Communication Communication Communication: No difficulties   Cognition Arousal/Alertness: Awake/alert Behavior During Therapy: Flat affect Overall Cognitive Status: Within Functional Limits for tasks assessed                                     General Comments  No family present during eval. Pt declined chair alarm. Pt asking to have his feet elevated onto a chair vs raising recliner leg support, "that thing's too hard to operate." Educated pt on not getting up by himself. Call button left on pt's lap. Discussed with RN and NT. BLE edema noted predominantly at ankle level and distal. Elevated pt's feet onto pillow in chair at end of session.     Exercises     Shoulder Instructions      Home Living Family/patient expects to be discharged to:: Private residence Living Arrangements: Parent Available Help at Discharge: Other (Comment) (no family/friends that can provide assist) Type of Home: House Home Access: Stairs to enter [employer] of Steps: 2   Home Layout: One level     Bathroom Shower/Tub: Tub/shower unit         Home Equipment: Cane - single point;Grab bars - tub/shower          Prior Functioning/Environment Level of Independence: Needs assistance  Gait / Transfers Assistance Needed: Pt was ambulating with wooden cane short household distances, limited due to fatigue.  He denies any falls in the past 6 months.   ADL's / Homemaking Assistance Needed: From 07/26/17 admission: Pt was not changing clothes as he was unable to and did not have assist.  Pt was independently showering.  Mother was doing the cooking. Pt no longer driving, family or friends assist with this.              OT Problem  List: Decreased activity tolerance;Impaired balance (sitting and/or standing);Decreased knowledge of use of DME or AE;Decreased knowledge of precautions;Pain;Increased edema      OT Treatment/Interventions: Self-care/ADL training;DME and/or AE instruction;Therapeutic activities;Energy conservation;Patient/family education;Balance training    OT Goals(Current goals can be found in the care plan section) Acute Rehab OT Goals Patient Stated Goal: not stated OT Goal Formulation: With patient Time For Goal Achievement: 08/19/17 Potential to Achieve Goals: Good ADL Goals Pt Will Perform Grooming: with modified independence;standing;sitting Pt Will Perform Lower Body Bathing: with modified independence;sit to/from stand Pt Will Perform Upper Body Dressing: with modified independence;sitting Pt Will Perform Lower Body Dressing: with modified independence;sit to/from stand Pt Will Transfer to Toilet: with modified independence;ambulating Pt Will Perform Toileting - Clothing Manipulation and hygiene: with modified independence;sit to/from stand Pt Will Perform Tub/Shower Transfer: Tub transfer;with supervision;ambulating;rolling walker;3 in 1 Additional ADL Goal #1: Pt will complete bed mobility at min guard level prior to OOB ADLs.   OT Frequency: Min 2X/week   Barriers to D/C:  Co-evaluation              AM-PAC PT "6 Clicks" Daily Activity     Outcome Measure Help from another person eating meals?: None Help from another person taking care of personal grooming?: None Help from another person toileting, which includes using toliet, bedpan, or urinal?: A Little Help from another person bathing (including washing, rinsing, drying)?: A Little Help from another person to put on and taking off regular upper body clothing?: A Little Help from another person to put on and taking off regular lower body clothing?: A Little 6 Click Score: 20   End of Session Equipment Utilized  During Treatment: Rolling walker Nurse Communication: Other (comment) (pt declined chair alarm, requeted using chair to support leg)  Activity Tolerance: Patient tolerated treatment well Patient left: in chair;with call bell/phone within reach  OT Visit Diagnosis: Unsteadiness on feet (R26.81);Muscle weakness (generalized) (M62.81);Pain                Time: 1610-9604 OT Time Calculation (min): 28 min Charges:  OT General Charges $OT Visit: 1 Visit OT Evaluation $OT Eval Low Complexity: 1 Low OT Treatments $Self Care/Home Management : 8-22 mins G-Codes:      Pilar Grammes 08/05/2017, 10:32 AM

## 2017-08-05 NOTE — Progress Notes (Signed)
Initial Nutrition Assessment  Late Entry for August 14, 2017  DOCUMENTATION CODES:   Not applicable  INTERVENTION:   -30 ml Prostat BID, each supplement provides 100 kcals ands 15 grams protein -MVI daily  NUTRITION DIAGNOSIS:   Increased nutrient needs related to wound healing as evidenced by estimated needs.  GOAL:   Patient will meet greater than or equal to 90% of their needs  MONITOR:   PO intake, Supplement acceptance, Labs, Weight trends, Skin, I & O's  REASON FOR ASSESSMENT:   Malnutrition Screening Tool    ASSESSMENT:   Cameron Coffey is a 56 y.o. male who presents with subscapular abscess that will likely need IR for drainage  Pt admitted with subscapular abscess. IR has been consulted for drainage.   Pt was not conversant at time of interview. Pt siting in chair with his head in his hands.   Reviewed wt hx; wt has been stable over the past year.   Nutrition-Focused physical exam completed. Findings are no fat depletion, no muscle depletion, and no edema.   Labs reviewed: Na: 132, CBGS: 81-183 (current regimen for glycemic control is 0-9 units insulin aspart TID and 14 units insulin glargine daily). Noted poorly controlled MD (Hgb A1c: 12.8 on 08/04/17- PTA medications 10 mg glipizide BID, 1000 mg metformin BID, 5 units insulin aspart 4 times daily, 14 units insulin glargine q HS).   Diet Order:  Diet Carb Modified Fluid consistency: Thin; Room service appropriate? Yes  Skin:  Wound (see comment) (st 2 buttocks, closed penile incision)  Last BM:  08/04/17  Height:   Ht Readings from Last 1 Encounters:  08/04/17  (1.753 m)    Weight:   Wt Readings from Last 1 Encounters:  08/04/17 168 lb (76.2 kg)    Ideal Body Weight:  72.7 kg  BMI:  Body mass index is 24.81 kg/m.  Estimated Nutritional Needs:   Kcal:  1900-2100  Protein:  100-115 grams  Fluid:  1.9-2.1 L  EDUCATION NEEDS:   Education needs no appropriate at this time  Alfonzo Arca A.  Mayford Knife, RD, LDN, CDE Pager: [phone] After hours Pager: [phone]

## 2017-08-05 NOTE — Consult Note (Signed)
Chief Complaint: Patient was seen in consultation today for left chest wall fluid collection  Referring Physician(s):  Dr. Fanny Skates  Supervising Physician: Corrie Mckusick  Patient Status: [facility] - In-pt  History of Present Illness: Cameron Coffey is a 56 y.o. male with past medical history of DM2, HLD, and HTN recently admitted with urosepsis due to prostate abscess now presents to [facility] ED with shoulder pain and fever.   CT Chest 08/04/17 showed: Large fluid collection is seen tracking along the lateral and posterior portion of the left chest wall most consistent with abscess. Its inferior component measures 8.2 x 3.6 cm, and more superior subscapular component measures 5.3 x 2.8 cm.  IR consulted for aspiration and drainage of left chest well abscess. Case reviewed and approved by Dr. Earleen Newport.  Patient did have breakfast this AM. He is currently not receiving blood thinners.   Past Medical History:  Diagnosis Date  . Heart murmur    "born w/one"  . Type II diabetes mellitus ([place])     Past Surgical History:  Procedure Laterality Date  . TONSILLECTOMY  1982  . TRANSURETHRAL RESECTION OF PROSTATE N/A 07/26/2017   Procedure: TRANSURETHRAL RESECTION OF THE PROSTATE (TURP) WITH UPROOFING OF PROSTATE ABSCESS;  Surgeon: Irine Seal, MD;  Location: [facility] ORS;  Service: Urology;  Laterality: N/A;    Allergies: Patient has no known allergies.  Medications: Prior to Admission medications   Medication Sig Start Date End Date Taking? Authorizing Provider  acetaminophen (TYLENOL) 325 MG tablet Take 325 mg by mouth every 6 (six) hours as needed for mild pain.   Yes [provider]  glipiZIDE (GLUCOTROL) 10 MG tablet Take 1 tablet (10 mg total) by mouth 2 (two) times daily before a meal. 07/15/17  Yes Merlyn Lot, MD  lisinopril (PRINIVIL,ZESTRIL) 5 MG tablet Take 1 tablet (5 mg total) by mouth daily. 01/27/17  Yes Jearld Fenton, NP  metFORMIN (GLUCOPHAGE) 1000 MG  tablet Take 1 tablet (1,000 mg total) by mouth 2 (two) times daily. 07/15/17  Yes Merlyn Lot, MD  simvastatin (ZOCOR) 40 MG tablet Take 1 tablet (40 mg total) by mouth daily. 01/27/17  Yes Baity, Coralie Keens, NP  ceFAZolin (ANCEF) IVPB Inject 1 g into the vein every 8 (eight) hours. Indication:  MSSA bacteremia Last Day of Therapy: october 29th Labs - Once weekly:  CBC/D and BMP, Labs - Every other week:  ESR and CRP 07/29/17   Epifanio Lesches, MD  insulin aspart (NOVOLOG FLEXPEN) 100 UNIT/ML FlexPen Inject 5 units subcutaneously before meals and and bedtime for DM CBG > or = 150.  Notify MD for CBG < 70 or > 500    [provider]  insulin glargine (LANTUS) 100 UNIT/ML injection Inject 0.14 mLs (14 Units total) into the skin at bedtime. 07/29/17   Epifanio Lesches, MD  metoprolol tartrate (LOPRESSOR) 25 MG tablet Take 0.5 tablets (12.5 mg total) by mouth 2 (two) times daily. 07/29/17   Epifanio Lesches, MD  oxyCODONE (ROXICODONE) 5 MG immediate release tablet Take 1 tablet (5 mg total) by mouth every 4 (four) hours as needed for severe pain. 08/02/17   Gerlene Fee, NP     Family History  Problem Relation Age of Onset  . Hyperlipidemia Mother   . Diabetes Mother   . Hyperlipidemia Father   . Diabetes Father     Social History   Social History  . Marital status: Divorced    Spouse name: N/A  . Number of  children: 3  . Years of education: N/A   Occupational History  . Truck Geophysicist/field seismologist    Social History Main Topics  . Smoking status: Current Every Day Smoker    Packs/day: 0.50    Years: 32.00    Types: Cigarettes  . Smokeless tobacco: Never Used  . Alcohol use 12.6 oz/week    21 Cans of beer per week     Comment: "drink qd"  . Drug use: Yes    Types: Cocaine     Comment: 08/04/2017 "nothing since ~ 02/2017"  . Sexual activity: Not Currently   Other Topics Concern  . None   Social History Narrative   Lives with mom    Review of Systems    Constitutional: Negative for fatigue and fever.  Respiratory: Negative for cough and shortness of breath.   Cardiovascular: Negative for chest pain.  Gastrointestinal: Negative for abdominal pain.  Musculoskeletal:       Shoulder pain, limited ROM  Psychiatric/Behavioral: Negative for behavioral problems and confusion.    Vital Signs: BP 119/81 (BP Location: Left Arm)   Pulse 68   Temp 98.5 F (36.9 C) (Oral)   Resp 18   Ht 5' 9"  (1.753 m)   Wt 168 lb (76.2 kg)   SpO2 100%   BMI 24.81 kg/m   Physical Exam  Constitutional: He is oriented to person, place, and time. He appears well-developed.  Cardiovascular: Normal rate, regular rhythm and normal heart sounds.   Pulmonary/Chest: Effort normal and breath sounds normal.  Musculoskeletal: He exhibits tenderness (large collection over left lateral chest wall extending to shoulder blade).  Neurological: He is alert and oriented to person, place, and time.  Psychiatric: He has a normal mood and affect. His behavior is normal. Judgment and thought content normal.  Nursing note and vitals reviewed.   Imaging: Ct Abdomen Pelvis W Wo Contrast  Result Date: 07/26/2017 CLINICAL DATA:  Hematuria, weight loss, abdominal pain. Urinary tract infection. EXAM: CT ABDOMEN AND PELVIS WITHOUT AND WITH CONTRAST TECHNIQUE: Multidetector CT imaging of the abdomen and pelvis was performed following the standard protocol before and following the bolus administration of intravenous contrast. CONTRAST:  161m ISOVUE-300 IOPAMIDOL (ISOVUE-300) INJECTION 61% COMPARISON:  None. FINDINGS: Lower chest: 8 mm nodule seen in the right middle lobe. Image quality is degraded by respiratory motion. Small left pleural effusion collapse/ consolidation in the left lower lobe. Heart size normal. No pericardial effusion. Distal esophagus is grossly unremarkable. Hepatobiliary: Liver margin is markedly irregular. Stones are seen in the gallbladder. No biliary ductal  dilatation. Pancreas: Negative. Spleen: Negative. Adrenals/Urinary Tract: Adrenal glands are unremarkable. Stones are seen in the kidneys bilaterally. There is a striated appearance in both kidneys with areas of ill-defined low attenuation on portal venous phase imaging. 1.3 cm low-attenuation lesion is seen in the interpolar left kidney (series 8, image 31), too small to characterize. On delayed imaging there is low attenuation material in the nondependent portion of the bladder. Stomach/Bowel: Stomach, small bowel, appendix and colon are grossly unremarkable. Vascular/Lymphatic: Atherosclerotic calcification of the arterial vasculature without abdominal aortic aneurysm. Borderline enlarged porta hepatis lymph nodes, likely reactive. Reproductive: Prostate is enlarged and contains a large area of low-attenuation, measuring approximately 3.9 x 4.1 cm. Other: Moderate ascites.  Mild presacral edema. Musculoskeletal: Diffuse body wall edema. There are multiple fluid collections within the chest wall musculature, some of which are best seen on nephrographic phase imaging and some of which have peripheral high attenuation on portal venous phase imaging.  Index collection in the left lateral chest wall measures 3.3 cm (incompletely imaged, series 5, image 1). A 1.3 cm low-attenuation collection with peripheral high attenuation is seen in the high right thigh musculature (series 5, image 76). No worrisome lytic or sclerotic lesions. IMPRESSION: 1. Organized fluid collections within the prostate and soft tissue musculature, highly worrisome for abscesses. 2. Striated appearance of both kidneys, most indicative of pyelonephritis. Difficult to exclude developing abscesses. 3. Debris in the bladder.  Difficult to exclude a mass. 4. Right middle lobe nodule. Metastatic disease is not excluded. Continued attention on follow-up is recommended. 5. Cirrhosis with moderate ascites. 6. Small left pleural effusion with  collapse/consolidation in the left lower lobe. 7. Bilateral renal stones. 8. Cholelithiasis. 9.  Aortic atherosclerosis (ICD10-170.0). Electronically Signed   By: Lorin Picket M.D.   On: 07/26/2017 08:03   Dg Chest 2 View  Result Date: 07/15/2017 CLINICAL DATA:  Weight loss.  Diabetes. EXAM: CHEST  2 VIEW COMPARISON:  None. FINDINGS: The heart size and mediastinal contours are within normal limits. Both lungs are clear. The visualized skeletal structures are unremarkable. IMPRESSION: No active cardiopulmonary disease. Electronically Signed   By: Van Clines M.D.   On: 07/15/2017 13:42   Ct Chest W Contrast  Addendum Date: 08/04/2017   ADDENDUM REPORT: 08/04/2017 15:59 Electronically Signed   By: Marijo Conception, M.D.   On: 08/04/2017 15:59   Result Date: 08/04/2017 CLINICAL DATA:  History of pneumonia, followup, former smoking history EXAM: CT CHEST WITH CONTRAST TECHNIQUE: Multidetector CT imaging of the chest was performed during intravenous contrast administration. CONTRAST:  22m ISOVUE-300 IOPAMIDOL (ISOVUE-300) INJECTION 61% COMPARISON:  Chest x-ray of 07/24/2017 FINDINGS: Cardiovascular: Variation of an aberrant right subclavian artery is noted coursing posterior to the esophagus. The thoracic aorta is well opacified with no acute abnormality noted. Mild thoracic aortic atherosclerotic change present. The pulmonary arteries are not as well opacified but no abnormality is seen. There are coronary artery calcifications primarily in the distribution of the left anterior descending. The heart is within upper limits of normal. No pericardial effusion is seen. The mid ascending thoracic aorta measures 31 mm in diameter. Mediastinum/Nodes: No mediastinal or hilar adenopathy is seen the thyroid gland is normal in size. Lungs/Pleura: On lung window images, there is a moderate size left pleural effusion present. There may be a small amount air within this collection of fluid posterior, and followup  is recommended. Opacity posteriorly in the left lower lobe is most consistent with atelectasis or pneumonia. A few air bronchograms are noted through this area of dense partial consolidation. Mild atelectasis is noted posteriorly at the right lung base. There is a pleural-based nodular opacity anteriorly in the right middle lobe which on axial image 85 series 3 measures 8 mm, being slightly more prominent on sagittal images. A developing lung neoplasm cannot be excluded and continued followup is recommended in 4-6 months by CT the chest. Upper Abdomen: There does appear to be ascites within the upper abdomen. There is protrusion of the anterior margin of the left lobe of liver as on image 130 series 2 and a hepatic mass cannot be excluded. Abdominal ultrasound may be helpful to assess, but MR of the abdomen would be preferable to evaluate this area further. Musculoskeletal: The thoracic vertebrae are in normal alignment with no compression deformity noted. IMPRESSION: 1. Moderate size left pleural effusion. Small amount air may be within this fluid collection and continued followup is recommended. 2. Opacity posteriorly in the  left lower lobe consistent with atelectasis or pneumonia. 3. 8 mm pleural-based opacity in the anterior right middle lobe. Cannot exclude neoplasm. Recommend CT of the chest is followup in 4-6 months. 4. Possible hepatic lesion anteriorly in the left lobe. Consider ultrasound or preferably MRI to assess further. Ascites in the upper abdomen. 5. Aberrant right subclavian artery. Electronically Signed: By: Ivar Drape M.D. On: 07/27/2017 16:10   Ct Chest W Contrast  Result Date: 08/04/2017 CLINICAL DATA:  Left-sided chest wall pain. EXAM: CT CHEST WITH CONTRAST TECHNIQUE: Multidetector CT imaging of the chest was performed during intravenous contrast administration. CONTRAST:  15m ISOVUE-300 IOPAMIDOL (ISOVUE-300) INJECTION 61% COMPARISON:  Radiograph of same day.  CT scan of July 27, 2017. FINDINGS: Cardiovascular: No significant vascular findings. Normal heart size. No pericardial effusion. 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 Mediastinum/Nodes: No enlarged mediastinal, hilar, or axillary lymph nodes. Thyroid gland, trachea, and esophagus demonstrate no significant findings. Lungs/Pleura: No pneumothorax is noted. Minimal left pleural effusion is noted with adjacent subsegmental atelectasis. Stable 8 mm subpleural nodule is noted anteriorly in right middle lobe best seen on image number 87 of series 7. Upper Abdomen: Hepatic cirrhosis is noted. Moderate ascites is noted in visualized portion of upper abdomen. Possible lesion is noted anteriorly in left hepatic lobe. Musculoskeletal: No fracture is noted. Large fluid collection is seen tracking along the lateral and posterior portion of the left chest wall most consistent with abscess. Subscapular component measures 5.3 x 2.8 cm. Posterior component measures 10.6 x 1.6 cm. Inferior and lateral component measures 8.2 x 3.6 cm. IMPRESSION: Large fluid collection is seen tracking along the lateral and posterior portion of the left chest wall most consistent with abscess. Its inferior component measures 8.2 x 3.6 cm, and more superior subscapular component measures 5.3 x 2.8 cm. Stable 8 mm right middle lobe nodule is noted. Non-contrast chest CT at 6-12 months is recommended. If the nodule is stable at time of repeat CT, then future CT at 18-24 months (from today's scan) is considered optional for low-risk patients, but is recommended for high-risk patients. This recommendation follows the consensus statement: Guidelines for Management of Incidental Pulmonary Nodules Detected on CT Images: From the Fleischner Society 2017; Radiology 2017; 284:228-243. Minimal left pleural effusion is noted with adjacent subsegmental atelectasis. Hepatic cirrhosis is noted with moderate ascites. Possible partially exophytic  lesion is seen arising from left hepatic lobe; MRI of the liver with and without gadolinium is recommended on nonemergent basis to rule out neoplasm. Electronically Signed   By: JMarijo Conception M.D.   On: 08/04/2017 15:56   UKoreaPelvis Limited (transabdominal Only)  Result Date: 07/25/2017 CLINICAL DATA:  Urinary retention and possible benign prostatic hypertrophy. EXAM: LIMITED ULTRASOUND OF PELVIS TECHNIQUE: Limited transabdominal ultrasound examination of the pelvis was performed. COMPARISON:  None. FINDINGS: Posterior urinary bladder wall mass greater than expected for the inter your icteric ridge shown for example on image 13, there is a 5.9 by 1.0 by 5.1 cm in thickness. Transitional cell carcinoma is a distinct possibility. The the prostate is moderately enlarged, volume calculated at 69 cubic cm. Calcifications in the prostate parenchyma are observed. Prevoid bladder volume was 146 cubic cm. Postvoid volume was 38 cubic cm. IMPRESSION: 1. Possible posterior bladder wall mass, for example image 13, transitional cell carcinoma is not excluded and cystoscopy is recommended. 2. Moderate enlargement the prostate gland at 69 cubic cm. Electronically Signed   By: WCindra EvesD.  On: 07/25/2017 12:19   Dg Chest Port 1 View  Result Date: 08/04/2017 CLINICAL DATA:  Port placement EXAM: PORTABLE CHEST 1 VIEW COMPARISON:  Chest CT 07/27/2017 FINDINGS: Right upper extremity PICC with tip at the upper cavoatrial junction. Normal heart size. Stable aortic contours. There is no edema, consolidation, effusion, or pneumothorax. IMPRESSION: Right upper extremity PICC with tip at the upper cavoatrial junction. No evidence of active cardiopulmonary disease. Electronically Signed   By: Monte Fantasia M.D.   On: 08/04/2017 15:20   Dg Chest Port 1 View  Result Date: 07/24/2017 CLINICAL DATA:  Sepsis, hypoxia, rule out pneumonia. EXAM: PORTABLE CHEST 1 VIEW COMPARISON:  Chest radiograph 07/15/2017 FINDINGS: The  cardiomediastinal contours are normal. The lungs are clear. Pulmonary vasculature is normal. No consolidation, pleural effusion, or pneumothorax. No acute osseous abnormalities are seen. IMPRESSION: No acute abnormality.  No evidence of pneumonia. Electronically Signed   By: Jeb Levering M.D.   On: 07/24/2017 05:03    Labs:  CBC:  Recent Labs  07/25/17 0308 07/27/17 1300 08/04/17 1145 08/04/17 1206 08/05/17 0459  WBC 15.4* 8.1 6.4  --  5.4  HGB 14.5 12.4* 10.4* 12.6* 9.7*  HCT 42.4 36.3* 29.7* 37.0* 28.5*  PLT 153 136* 103*  --  106*    COAGS:  Recent Labs  07/24/17 1223 07/26/17 0153 08/05/17 0459  INR 1.41  --  1.32  APTT 31 58*  --     BMP:  Recent Labs  07/25/17 0308 07/27/17 1444 07/28/17 1233 07/31/17 08/04/17 1145 08/04/17 1206 08/05/17 0459  NA 129*  --  130* 134* 131* 137 132*  K 3.1*  --  3.4* 3.9 3.9 4.0 3.4*  CL 94*  --  98*  --  101 98* 102  CO2 27  --  26  --  27  --  27  GLUCOSE 93  --  231*  --  121* 123* 109*  BUN 12 11 8 5  5* 6 <5*  CALCIUM 7.9*  --  7.3*  --  7.9*  --  7.5*  CREATININE 0.61 0.63 0.70 0.5* 0.67 0.50* 0.56*  GFRNONAA >60 >60 >60  --  >60  --  >60  GFRAA >60 >60 >60  --  >60  --  >60    LIVER FUNCTION TESTS:  Recent Labs  08/14/16 1411 07/15/17 1315 07/31/17 08/04/17 1145 08/05/17 0459  BILITOT 1.1 2.1*  --  1.9* 1.8*  AST 162* 179* 130* 92* 85*  ALT 130* 126* 60* 36 34  ALKPHOS 245* 137* 183* 106 105  PROT 8.1 8.5*  --  9.0* 8.0  ALBUMIN 3.0* 2.8*  --  1.5* 1.3*    TUMOR MARKERS: No results for input(s): AFPTM, CEA, CA199, CHROMGRNA in the last 8760 hours.  Assessment and Plan: Left chest wall fluid collection Patient with past medical history of DM, HTN, HLD, prostate abscess and urosepsis presents with complaint of left chest wall abscess.  IR consulted for aspiration and drainage of chest abscess at the request of Dr. Renelda Loma. Case reviewed by Dr. Earleen Newport who approves patient for procedure.  Patient  presents today in their usual state of health.  He has been NPO as of 8am and is not currently on blood thinners.  Risks and benefits discussed with the patient including bleeding, infection, damage to adjacent structures, bowel perforation/fistula connection, and sepsis. All of the patient's questions were answered, patient is agreeable to proceed. Consent signed and in chart.  Thank you for this interesting consult.  I greatly enjoyed meeting Cameron Coffey and look forward to participating in their care.  A copy of this report was sent to the requesting provider on this date.  Electronically Signed: Docia Barrier, PA 08/05/2017, 9:47 AM   I spent a total of 40 Minutes    in face to face in clinical consultation, greater than 50% of which was counseling/coordinating care for abscess.

## 2017-08-06 DIAGNOSIS — Z9889 Other specified postprocedural states: Secondary | ICD-10-CM

## 2017-08-06 DIAGNOSIS — Z95828 Presence of other vascular implants and grafts: Secondary | ICD-10-CM

## 2017-08-06 DIAGNOSIS — B9561 Methicillin susceptible Staphylococcus aureus infection as the cause of diseases classified elsewhere: Secondary | ICD-10-CM

## 2017-08-06 DIAGNOSIS — F17210 Nicotine dependence, cigarettes, uncomplicated: Secondary | ICD-10-CM

## 2017-08-06 DIAGNOSIS — E119 Type 2 diabetes mellitus without complications: Secondary | ICD-10-CM

## 2017-08-06 DIAGNOSIS — Z833 Family history of diabetes mellitus: Secondary | ICD-10-CM

## 2017-08-06 DIAGNOSIS — M868X1 Other osteomyelitis, shoulder: Secondary | ICD-10-CM

## 2017-08-06 DIAGNOSIS — R911 Solitary pulmonary nodule: Secondary | ICD-10-CM

## 2017-08-06 DIAGNOSIS — Z87438 Personal history of other diseases of male genital organs: Secondary | ICD-10-CM

## 2017-08-06 DIAGNOSIS — Z8349 Family history of other endocrine, nutritional and metabolic diseases: Secondary | ICD-10-CM

## 2017-08-06 LAB — GLUCOSE, CAPILLARY
GLUCOSE-CAPILLARY: 99 mg/dL (ref 65–99)
GLUCOSE-CAPILLARY: 132 mg/dL — AB (ref 65–99)
Glucose-Capillary: 181 mg/dL — ABNORMAL HIGH (ref 65–99)
Glucose-Capillary: 204 mg/dL — ABNORMAL HIGH (ref 65–99)

## 2017-08-06 LAB — BASIC METABOLIC PANEL
Anion gap: 3 — ABNORMAL LOW (ref 5–15)
BUN: <5 mg/dL — ABNORMAL LOW (ref 6–20)
CHLORIDE: 101 mmol/L (ref 101–111)
CO2: 28 mmol/L (ref 22–32)
CREATININE: 0.68 mg/dL (ref 0.61–1.24)
Calcium: 7.6 mg/dL — ABNORMAL LOW (ref 8.9–10.3)
GFR calc non Af Amer: >60 mL/min (ref >60)
Glucose, Bld: 181 mg/dL — ABNORMAL HIGH (ref 65–99)
Potassium: 3.5 mmol/L (ref 3.5–5.1)
Sodium: 132 mmol/L — ABNORMAL LOW (ref 135–145)

## 2017-08-06 MED ORDER — CEFAZOLIN SODIUM-DEXTROSE 2-4 GM/100ML-% IV SOLN
2.0000 g | Freq: Three times a day (TID) | INTRAVENOUS | Status: DC
  Administered 2017-08-06 – 2017-08-13 (×20): 2 g via INTRAVENOUS
  Filled 2017-08-06 (×26): qty 100

## 2017-08-06 MED ORDER — FUROSEMIDE 10 MG/ML IJ SOLN
20.0000 mg | Freq: Once | INTRAMUSCULAR | Status: AC
  Administered 2017-08-06: 20 mg via INTRAVENOUS
  Filled 2017-08-06: qty 2

## 2017-08-06 NOTE — Progress Notes (Signed)
Pharmacy Antibiotic Note Cameron Coffey is a 56 y.o. male admitted on 08/04/2017 with scapula abscess. Pt with recent hx of MSSA bacteremia and was on Ancef prior to admission. Antibiotics brooded on admission to Zosyn and vancomycin pending drainage of abscess and new cx data. Currently on day 3 of broadened abx.   Plan: 1. Continue vancomycin 750 mg IV every 8 hours for now 2. Obtain vancomycin trough before 18:00 dose this evening; goal trough 15-20 3. Remains on Zosyn 3.375 grams IV every 8 hours (infused over 4 hours) 4. If culture data from scapula abscess results as MSSA would change abx to Ancef 2 grams IV every 8 hours (noted original planned stop date of 10/29 for MSSA bacteremia)  Height:  (175.3 cm) Weight: 168 lb (76.2 kg) IBW/kg (Calculated) : 70.7  Temp (24hrs), Avg:98.4 F (36.9 C), Min:98.2 F (36.8 C), Max:98.8 F (37.1 C)   Recent Labs Lab 07/31/17 08/04/17 1145 08/04/17 1206 08/04/17 1616 08/05/17 0459 08/06/17 0411  WBC  --  6.4  --   --  5.4  --   CREATININE 0.5* 0.67 0.50*  --  0.56* 0.68  LATICACIDVEN  --   --  2.47* 2.65*  --   --     Estimated Creatinine Clearance: 104.3 mL/min (by C-G formula based on SCr of 0.68 mg/dL).    No Known Allergies  Antimicrobials this admission: . 9/26 vancomycin > . 9/26 zosyn >   Microbiology results: 9/27: scapula abscess: GPC clusters  9/26 blood cx: 9/26 urine cx: 9/16 Bcx: MSSA  Thank you for allowing pharmacy to be a part of this patient's care.  Pollyann Samples, PharmD, BCPS 08/06/2017, 11:47 AM

## 2017-08-06 NOTE — Progress Notes (Signed)
Pharmacy Antibiotic Note  Cameron Coffey is a 56 y.o. male admitted on 08/04/2017 with scapula abscess. Patient with recent history of MSSA bacteremia and was on Ancef prior to admission. Antibiotics brooded on admission to Zosyn and vancomycin, and now to narrow to Ancef per ID.   Patient's renal function has been relatively stable.  Afebrile, WBC WNL.   Plan: Ancef 2gm IV Q8H Monitor renal fxn, micro data, clinical progress    Height:  (175.3 cm) Weight: 168 lb (76.2 kg) IBW/kg (Calculated) : 70.7  Temp (24hrs), Avg:98.4 F (36.9 C), Min:98.2 F (36.8 C), Max:98.8 F (37.1 C)   Recent Labs Lab 07/31/17 08/04/17 1145 08/04/17 1206 08/04/17 1616 08/05/17 0459 08/06/17 0411  WBC  --  6.4  --   --  5.4  --   CREATININE 0.5* 0.67 0.50*  --  0.56* 0.68  LATICACIDVEN  --   --  2.47* 2.65*  --   --     Estimated Creatinine Clearance: 104.3 mL/min (by C-G formula based on SCr of 0.68 mg/dL).    No Known Allergies  . 9/26 vancomycin >9/28 . 9/26 zosyn >9/28 . Ancef 9/28 >>  9/27: scapula abscess: few staph aureus  9/26 blood cx: ngtd 9/26 urine cx: >  100K yeast   9/16 Bcx: MSSA   Cameron Coffey D. Laney Potash, PharmD, BCPS Pager:  [phone] 08/06/2017, 3:45 PM

## 2017-08-06 NOTE — Progress Notes (Signed)
PROGRESS NOTE    Cameron Coffey  ZOX:[id number] DOB: 05/31/1961 DOA: 08/04/2017 PCP: Lorre Munroe, NP    Brief Narrative:  Cameron Coffey is a 56 y.o. male who presents with subscapular abscess that will likely need IR for drainage. IR consult placed. Continue current ABX regimen. Additionally Urology to see pt due to persistent pain from recent TURP.  Assessment & Plan:     Abscess of scapular region - IR was consulted and patient is status post ultrasound guided drainage of abscess. - Will follow-up with culture results for now antibiotics per ID recommendations.  Prostatic abscess - consulted Urology. Pt is currently on Vancomycin and Zosyn. Pt is s/p TURP with unroofing of prostatic abscess per my notes. Blood cultures at that time grew staph aureus, patient had PICC line was seen by Dr. Sampson Goon who recommended IV Ancef every 8 hours until October 29  Active Problems:   Essential hypertension - Patient is on lisinopril -Patient is on metoprolol    Dyslipidemia associated with type 2 diabetes mellitus (HCC) -  Pt is on Statin, no chest pain reported. stable    Type II diabetes with long term use of insulin (HCC) - Pt is on carb modified diet and lantus as well as SSI    MSSA (methicillin susceptible Staphylococcus aureus) septicemia (HCC) - Pt on vancomycin    Acute bilateral back pain - secondary to abscess - continue supportive therapy. Pt is s/p procedure listed above and below    Constipation, chronic - stable on dulcolax    Bilateral nephrolithiasis  DVT prophylaxis: SCD's Code Status: Full Family Communication: None at bedside.  Disposition Plan: pending improvement in condition   Consultants:   Radiology  General surgery   Procedures: US guided drainage of left posterior back/shoulder abscess  Antimicrobials: Zosyn, Vancomycin  Subjective: Pt has no new complaints. No acute issues overnight.  Objective: Vitals:   08/05/17 2126 08/06/17  0533 08/06/17 0941 08/06/17 1333  BP: 125/81 122/78 130/89 130/89  Pulse: 65 61 70 72  Resp: (!) Temp: 98.8 F (37.1 C) 98.2 F (36.8 C)  98.2 F (36.8 C)  TempSrc: Oral Oral  Oral  SpO2: 100% 100%  100%  Weight:      Height:        Intake/Output Summary (Last 24 hours) at 08/06/17 1806 Last data filed at 08/06/17 1657  Gross per 24 hour  Intake              910 ml  Output             3900 ml  Net            -2990 ml   Filed Weights   08/04/17 0956 08/04/17 1757  Weight: 68 kg (150 lb) 76.2 kg (168 lb)    Examination:  General exam: Appears calm and comfortable, in nad.   Respiratory system: Clear to auscultation. Respiratory effort normal. Equal chest rise. Cardiovascular system: S1 & S2 heard, RRR. No JVD, murmurs, rubs, gallops or clicks Gastrointestinal system: Abdomen is nondistended, soft and nontender. No organomegaly or masses felt. Normal bowel sounds heard. Central nervous system: Alert and oriented. No focal neurological deficits. Extremities: Symmetric 5 x 5 power. Skin: No rashes or ulcers, warm and dry, pt has drain in place with serosanguinous fluid in bag Psychiatry:  Mood & affect appropriate.     Data Reviewed: I have personally reviewed following labs and imaging studies  CBC:  Recent Labs Lab 08/04/17 1145 08/04/17 1206 08/05/17 0459  WBC 6.4  --  5.4  NEUTROABS 4.1  --   --   HGB 10.4* 12.6* 9.7*  HCT 29.7* 37.0* 28.5*  MCV 88.1  --  88.2  PLT 103*  --  106*   Basic Metabolic Panel:  Recent Labs Lab 07/31/17 08/04/17 1145 08/04/17 1206 08/05/17 0459 08/06/17 0411  NA 134* 131* 137 132* 132*  K 3.9 3.9 4.0 3.4* 3.5  CL  --  101 98* 102 101  CO2  --  27  --  27 28  GLUCOSE  --  121* 123* 109* 181*  BUN 5 5* 6 <5* <5*  CREATININE 0.5* 0.67 0.50* 0.56* 0.68  CALCIUM  --  7.9*  --  7.5* 7.6*   GFR: Estimated Creatinine Clearance: 104.3 mL/min (by C-G formula based on SCr of 0.68 mg/dL). Liver Function  Tests:  Recent Labs Lab 07/31/17 08/04/17 1145 08/05/17 0459  AST 130* 92* 85*  ALT 60* 36 34  ALKPHOS 183* 106 105  BILITOT  --  1.9* 1.8*  PROT  --  9.0* 8.0  ALBUMIN  --  1.5* 1.3*   No results for input(s): LIPASE, AMYLASE in the last 168 hours. No results for input(s): AMMONIA in the last 168 hours. Coagulation Profile:  Recent Labs Lab 08/05/17 0459  INR 1.32   Cardiac Enzymes: No results for input(s): CKTOTAL, CKMB, CKMBINDEX, TROPONINI in the last 168 hours. BNP (last 3 results) No results for input(s): PROBNP in the last 8760 hours. HbA1C:  Recent Labs  08/04/17 1145  HGBA1C 12.8*   CBG:  Recent Labs Lab 08/05/17 1716 08/05/17 2129 08/06/17 0758 08/06/17 1144 08/06/17 1705  GLUCAP 81 183* 99 132* 181*   Lipid Profile: No results for input(s): CHOL, HDL, LDLCALC, TRIG, CHOLHDL, LDLDIRECT in the last 72 hours. Thyroid Function Tests: No results for input(s): TSH, T4TOTAL, FREET4, T3FREE, THYROIDAB in the last 72 hours. Anemia Panel: No results for input(s): VITAMINB12, FOLATE, FERRITIN, TIBC, IRON, RETICCTPCT in the last 72 hours. Sepsis Labs:  Recent Labs Lab 08/04/17 1206 08/04/17 1616  LATICACIDVEN 2.47* 2.65*    Recent Results (from the past 240 hour(s))  Blood culture (routine x 2)     Status: None (Preliminary result)   Collection Time: 08/04/17 11:45 AM  Result Value Ref Range Status   Specimen Description BLOOD LEFT ANTECUBITAL  Final   Special Requests   Final    BOTTLES DRAWN AEROBIC AND ANAEROBIC Blood Culture adequate volume   Culture NO GROWTH 2 DAYS  Final   Report Status PENDING  Incomplete  Blood culture (routine x 2)     Status: None (Preliminary result)   Collection Time: 08/04/17 11:52 AM  Result Value Ref Range Status   Specimen Description BLOOD LEFT WRIST  Final   Special Requests   Final    BOTTLES DRAWN AEROBIC AND ANAEROBIC Blood Culture adequate volume   Culture NO GROWTH 2 DAYS  Final   Report Status PENDING   Incomplete  Urine culture     Status: Abnormal   Collection Time: 08/04/17  5:07 PM  Result Value Ref Range Status   Specimen Description URINE, CLEAN CATCH  Final   Special Requests NONE  Final   Culture >=100,000 COLONIES/mL YEAST (A)  Final   Report Status 08/05/2017 FINAL  Final  MRSA PCR Screening     Status: None   Collection Time: 08/04/17  6:56 PM  Result Value Ref Range Status  MRSA by PCR NEGATIVE NEGATIVE Final    Comment:        The GeneXpert MRSA Assay (FDA approved for NASAL specimens only), is one component of a comprehensive MRSA colonization surveillance program. It is not intended to diagnose MRSA infection nor to guide or monitor treatment for MRSA infections.   Aerobic/Anaerobic Culture (surgical/deep wound)     Status: None (Preliminary result)   Collection Time: 08/05/17  4:28 PM  Result Value Ref Range Status   Specimen Description ABSCESS BACK  Final   Special Requests NONE  Final   Gram Stain   Final    ABUNDANT WBC PRESENT, PREDOMINANTLY PMN FEW GRAM POSITIVE COCCI IN CLUSTERS    Culture   Final    FEW STAPHYLOCOCCUS AUREUS CULTURE REINCUBATED FOR BETTER GROWTH    Report Status PENDING  Incomplete         Radiology Studies: US Aspiration  Result Date: 08/05/2017 INDICATION: 56 year old male with a history of left shoulder abscess, subscapular EXAM: ULTRASOUND-GUIDED DRAINAGE LEFT POSTERIOR CHEST WALL ABSCESS MEDICATIONS: The patient is currently admitted to the hospital and receiving intravenous antibiotics. The antibiotics were administered within an appropriate time frame prior to the initiation of the procedure. ANESTHESIA/SEDATION: Fentanyl 2.0 mcg IV; Versed 100 mg IV Moderate Sedation Time:  20 minutes The patient was continuously monitored during the procedure by the interventional radiology nurse under my direct supervision. COMPLICATIONS: None PROCEDURE: Informed written consent was obtained from the patient after a thorough discussion  of the procedural risks, benefits and alternatives. All questions were addressed. Maximal Sterile Barrier Technique was utilized including caps, mask, sterile gowns, sterile gloves, sterile drape, hand hygiene and skin antiseptic. A timeout was performed prior to the initiation of the procedure. Patient positioned right decubitus on the ultrasound table. Scout ultrasound images were acquired with images stored sent to PACs. The patient was then prepped and draped in the usual sterile fashion, and the skin and subcutaneous tissues were generously infiltrated 1% lidocaine for local anesthesia. Small stab incision was made with 11 blade scalpel. Using ultrasound guidance, trocar needle was advanced under ultrasound guidance into the fluid collection of the posterior left chest. Modified Seldinger technique was then used to place a 10 [place] drain into the fluid collection. Aspiration of frankly purulent an bloody fluid was performed with a sample sent to the lab for analysis. Ten [place] drain was sutured to the chest wall an attached to gravity drainage. Patient tolerated the procedure well and remained hemodynamically stable throughout. No complications were encountered and no significant blood loss. IMPRESSION: Status post ultrasound-guided drainage of posterior left chest wall abscess associated with the scapula. Sample was sent to the lab for culture. Signed, Yvone Neu. Loreta Ave, DO [facility] Radiology Electronically Signed   By: Gilmer Mor D.O.   On: 08/05/2017 17:16        Scheduled Meds: . feeding supplement (PRO-STAT SUGAR FREE 64)  30 mL Oral BID  . insulin aspart  0-9 Units Subcutaneous TID WC  . insulin glargine  14 Units Subcutaneous QHS  . lidocaine-EPINEPHrine  20 mL Intradermal Once  . lisinopril  5 mg Oral Daily  . metoprolol tartrate  12.5 mg Oral BID  . multivitamin with minerals  1 tablet Oral Daily  . simvastatin  40 mg Oral Daily  .  sodium chloride flush  5 mL Intravenous Q8H   Continuous Infusions: .  ceFAZolin (ANCEF) IV       LOS: 2 days  Time spent: > 35 minutes  Penny Pia, MD [facility] Pager [phone]  If 7PM-7AM, please contact night-coverage [email or web address] Password [facility] 08/06/2017, 6:06 PM

## 2017-08-06 NOTE — Consult Note (Addendum)
[facility] Infectious Disease    Date of Admission:  08/04/2017   Total days of antibiotics: 3 vanco/zosyn               Reason for Consult: scapular abscess    Referring Provider: Karleen Hampshire   Assessment: Scapular abscess Recent MSSA bacteremia  recent prostatic abscess  type 2 diabetes PIC line in place lower extremity edema  Plan: 1. Stop vancomycin 2. Stop Zosyn 3. Resume Ancef at high dose 4. Check TEE 5. Await blood cultures 6. Follow-up fingerstick glucoses (have been well controlled) 7. A1c suggests he has poor control (12.8) 8. Diabetes mgmt eval 9. Fluid management, he is now roughly 1.4 L out 10. Opat consult  Thank you so much for this interesting consult,  Active Problems:   Essential hypertension   Dyslipidemia associated with type 2 diabetes mellitus ([place])   Type II diabetes with long term use of insulin (H[facility])   MSSA (methicillin susceptible Staphylococcus aureus) septicemia ([facility])   Mass on back   Acute bilateral back pain   Constipation, chronic   Bilateral nephrolithiasis   Pain in scapula   Abscess of scapular region   Pressure injury of skin   . feeding supplement (PRO-STAT SUGAR FREE 64)  30 mL Oral BID  . insulin aspart  0-9 Units Subcutaneous TID WC  . insulin glargine  14 Units Subcutaneous QHS  . lidocaine-EPINEPHrine  20 mL Intradermal Once  . lisinopril  5 mg Oral Daily  . metoprolol tartrate  12.5 mg Oral BID  . multivitamin with minerals  1 tablet Oral Daily  . simvastatin  40 mg Oral Daily  . sodium chloride flush  5 mL Intravenous Q8H    HPI: MAKALE PINDELL is a 56 y.o. male with a history of diabetes and TURP on September 15, discharged from [facility] on September 20. He was found at that time to have a prostatic abscess. His blood cultures and urine cultures grew methicillin sensitive staph aureus. He had a TTE on September 17 that does not mention vegetation. He was discharged on Ancef 2 g every 8 hours for  duration of at least 4 weeks. He was to follow-up with infectious disease in [place]. He returns to the hospital on September 26 with 2 days of increasing pain in his left scapula. He was afebrile with a normal white blood cell count. A CT of the chest showed: Large fluid collection is seen tracking along the lateral and posterior portion of the left chest wall most consistent with abscess. Its inferior component measures 8.2 x 3.6 cm, and more superior subscapular component measures 5.3 x 2.8 cm. Stable 8 mm right middle lobe nodule is noted. He was started on vancomycin and Zosyn. He was seen by surgery as well as interventional radiology, he had an ultrasound-guided drainage of the abscess. This is growing staph aureus. He states his back pain is significant better. He has had no difficulty with his PICC line.  He is had diabetes for 10 years. He's had no change in his vision. He's had no numbness in his lower extremities. Since being in the hospital he has noticed significant lower extremity edema   Review of Systems: Review of Systems  Constitutional: Negative for chills and fever.  Eyes: Negative for blurred vision and double vision.  Cardiovascular: Positive for leg swelling.  Gastrointestinal: Negative for constipation and diarrhea.  Genitourinary: Negative for dysuria and urgency.  Musculoskeletal: Positive for  back pain.  Skin: Negative for rash.  Please see HPI. 12 point ROS o/w (-)   Past Medical History:  Diagnosis Date  . Heart murmur    "born w/one"  . Type II diabetes mellitus ([place])     Social History  Substance Use Topics  . Smoking status: Current Every Day Smoker    Packs/day: 0.50    Years: 32.00    Types: Cigarettes  . Smokeless tobacco: Never Used  . Alcohol use 12.6 oz/week    21 Cans of beer per week     Comment: "drink qd"    Family History  Problem Relation Age of Onset  . Hyperlipidemia Mother   . Diabetes Mother   . Hyperlipidemia  Father   . Diabetes Father      Medications:  Scheduled: . feeding supplement (PRO-STAT SUGAR FREE 64)  30 mL Oral BID  . insulin aspart  0-9 Units Subcutaneous TID WC  . insulin glargine  14 Units Subcutaneous QHS  . lidocaine-EPINEPHrine  20 mL Intradermal Once  . lisinopril  5 mg Oral Daily  . metoprolol tartrate  12.5 mg Oral BID  . multivitamin with minerals  1 tablet Oral Daily  . simvastatin  40 mg Oral Daily  . sodium chloride flush  5 mL Intravenous Q8H    Abtx:  Anti-infectives    Start     Dose/Rate Route Frequency Ordered Stop   08/05/17 0200  vancomycin (VANCOCIN) IVPB 750 mg/150 ml premix     750 mg 150 mL/hr over 60 Minutes Intravenous Every 8 hours 08/04/17 1549     08/04/17 2200  piperacillin-tazobactam (ZOSYN) IVPB 3.375 g     3.375 g 12.5 mL/hr over 240 Minutes Intravenous Every 8 hours 08/04/17 1549     08/04/17 1445  vancomycin (VANCOCIN) 1,250 mg in sodium chloride 0.9 % 250 mL IVPB     1,250 mg 166.7 mL/hr over 90 Minutes Intravenous  Once 08/04/17 1434 08/04/17 1835   08/04/17 1445  piperacillin-tazobactam (ZOSYN) IVPB 3.375 g     3.375 g 100 mL/hr over 30 Minutes Intravenous  Once 08/04/17 1434 08/04/17 1627        OBJECTIVE: Blood pressure 130/89, pulse 72, temperature 98.2 F (36.8 C), temperature source Oral, resp. rate 20, height _0  (1.753 m), weight 76.2 kg (168 lb), SpO2 100 %.  Physical Exam  Constitutional: He is well-developed, well-nourished, and in no distress. No distress.  HENT:  Mouth/Throat: No oropharyngeal exudate.  Eyes: Pupils are equal, round, and reactive to light. EOM are normal.  Neck: Neck supple.  Cardiovascular: Normal rate and regular rhythm.   Murmur heard. Pulmonary/Chest: Effort normal and breath sounds normal.  Abdominal: Soft. Bowel sounds are normal. He exhibits distension. There is no tenderness. There is no rebound.  Musculoskeletal: He exhibits edema.  Lymphadenopathy:    He has no cervical  adenopathy.  Neurological:  Sensation grossly normal  Skin:       Lab Results Results for orders placed or performed during the hospital encounter of 08/04/17 (from the past 48 hour(s))  I-Stat CG4 Lactic Acid, ED     Status: Abnormal   Collection Time: 08/04/17  4:16 PM  Result Value Ref Range   Lactic Acid, Venous 2.65 (HH) 0.5 - 1.9 mmol/L   Comment NOTIFIED PHYSICIAN   Urinalysis, Routine w reflex microscopic     Status: Abnormal   Collection Time: 08/04/17  5:07 PM  Result Value Ref Range   Color, Urine AMBER (A)  YELLOW    Comment: BIOCHEMICALS MAY BE AFFECTED BY COLOR   APPearance CLOUDY (A) CLEAR   Specific Gravity, Urine 1.012 1.005 - 1.030   pH 8.0 5.0 - 8.0   Glucose, UA NEGATIVE NEGATIVE mg/dL   Hgb urine dipstick LARGE (A) NEGATIVE   Bilirubin Urine NEGATIVE NEGATIVE   Ketones, ur NEGATIVE NEGATIVE mg/dL   Protein, ur 100 (A) NEGATIVE mg/dL   Nitrite NEGATIVE NEGATIVE   Leukocytes, UA LARGE (A) NEGATIVE   RBC / HPF TOO NUMEROUS TO COUNT 0 - 5 RBC/hpf   WBC, UA TOO NUMEROUS TO COUNT 0 - 5 WBC/hpf   Bacteria, UA FEW (A) NONE SEEN   Squamous Epithelial / LPF NONE SEEN NONE SEEN   WBC Clumps PRESENT    Mucus PRESENT    Budding Yeast PRESENT   Urine culture     Status: Abnormal   Collection Time: 08/04/17  5:07 PM  Result Value Ref Range   Specimen Description URINE, CLEAN CATCH    Special Requests NONE    Culture >=100,000 COLONIES/mL YEAST (A)    Report Status 08/05/2017 FINAL   CBG monitoring, ED     Status: Abnormal   Collection Time: 08/04/17  5:15 PM  Result Value Ref Range   Glucose-Capillary 184 (H) 65 - 99 mg/dL  Glucose, capillary     Status: Abnormal   Collection Time: 08/04/17  6:11 PM  Result Value Ref Range   Glucose-Capillary 187 (H) 65 - 99 mg/dL  MRSA PCR Screening     Status: None   Collection Time: 08/04/17  6:56 PM  Result Value Ref Range   MRSA by PCR NEGATIVE NEGATIVE    Comment:        The GeneXpert MRSA Assay (FDA approved for  NASAL specimens only), is one component of a comprehensive MRSA colonization surveillance program. It is not intended to diagnose MRSA infection nor to guide or monitor treatment for MRSA infections.   Glucose, capillary     Status: Abnormal   Collection Time: 08/04/17  9:28 PM  Result Value Ref Range   Glucose-Capillary 224 (H) 65 - 99 mg/dL  Comprehensive metabolic panel     Status: Abnormal   Collection Time: 08/05/17  4:59 AM  Result Value Ref Range   Sodium 132 (L) 135 - 145 mmol/L   Potassium 3.4 (L) 3.5 - 5.1 mmol/L   Chloride 102 101 - 111 mmol/L   CO2 27 22 - 32 mmol/L   Glucose, Bld 109 (H) 65 - 99 mg/dL   BUN <5 (L) 6 - 20 mg/dL   Creatinine, Ser 0.56 (L) 0.61 - 1.24 mg/dL   Calcium 7.5 (L) 8.9 - 10.3 mg/dL   Total Protein 8.0 6.5 - 8.1 g/dL   Albumin 1.3 (L) 3.5 - 5.0 g/dL   AST 85 (H) 15 - 41 U/L   ALT 34 17 - 63 U/L   Alkaline Phosphatase 105 38 - 126 U/L   Total Bilirubin 1.8 (H) 0.3 - 1.2 mg/dL   GFR calc non Af Amer >60 >60 mL/min   GFR calc Af Amer >60 >60 mL/min    Comment: (NOTE) The eGFR has been calculated using the CKD EPI equation. This calculation has not been validated in all clinical situations. eGFR's persistently <60 mL/min signify possible Chronic Kidney Disease.    Anion gap 3 (L) 5 - 15  CBC     Status: Abnormal   Collection Time: 08/05/17  4:59 AM  Result Value Ref Range  WBC 5.4 4.0 - 10.5 K/uL   RBC 3.23 (L) 4.22 - 5.81 MIL/uL   Hemoglobin 9.7 (L) 13.0 - 17.0 g/dL   HCT 28.5 (L) 39.0 - 52.0 %   MCV 88.2 78.0 - 100.0 fL   MCH 30.0 26.0 - 34.0 pg   MCHC 34.0 30.0 - 36.0 g/dL   RDW 15.1 11.5 - 15.5 %   Platelets 106 (L) 150 - 400 K/uL    Comment: REPEATED TO VERIFY CONSISTENT WITH PREVIOUS RESULT   Protime-INR     Status: Abnormal   Collection Time: 08/05/17  4:59 AM  Result Value Ref Range   Prothrombin Time 16.3 (H) 11.4 - 15.2 seconds   INR 1.32   Glucose, capillary     Status: Abnormal   Collection Time: 08/05/17  7:45  AM  Result Value Ref Range   Glucose-Capillary 55 (L) 65 - 99 mg/dL  Glucose, capillary     Status: None   Collection Time: 08/05/17  8:23 AM  Result Value Ref Range   Glucose-Capillary 90 65 - 99 mg/dL  Glucose, capillary     Status: Abnormal   Collection Time: 08/05/17 12:27 PM  Result Value Ref Range   Glucose-Capillary 130 (H) 65 - 99 mg/dL  Aerobic/Anaerobic Culture (surgical/deep wound)     Status: None (Preliminary result)   Collection Time: 08/05/17  4:28 PM  Result Value Ref Range   Specimen Description ABSCESS BACK    Special Requests NONE    Gram Stain      ABUNDANT WBC PRESENT, PREDOMINANTLY PMN FEW GRAM POSITIVE COCCI IN CLUSTERS    Culture      FEW STAPHYLOCOCCUS AUREUS CULTURE REINCUBATED FOR BETTER GROWTH    Report Status PENDING   Glucose, capillary     Status: None   Collection Time: 08/05/17  5:16 PM  Result Value Ref Range   Glucose-Capillary 81 65 - 99 mg/dL  Glucose, capillary     Status: Abnormal   Collection Time: 08/05/17  9:29 PM  Result Value Ref Range   Glucose-Capillary 183 (H) 65 - 99 mg/dL  Basic metabolic panel     Status: Abnormal   Collection Time: 08/06/17  4:11 AM  Result Value Ref Range   Sodium 132 (L) 135 - 145 mmol/L   Potassium 3.5 3.5 - 5.1 mmol/L   Chloride 101 101 - 111 mmol/L   CO2 28 22 - 32 mmol/L   Glucose, Bld 181 (H) 65 - 99 mg/dL   BUN <5 (L) 6 - 20 mg/dL   Creatinine, Ser 0.68 0.61 - 1.24 mg/dL   Calcium 7.6 (L) 8.9 - 10.3 mg/dL   GFR calc non Af Amer >60 >60 mL/min   GFR calc Af Amer >60 >60 mL/min    Comment: (NOTE) The eGFR has been calculated using the CKD EPI equation. This calculation has not been validated in all clinical situations. eGFR's persistently <60 mL/min signify possible Chronic Kidney Disease.    Anion gap 3 (L) 5 - 15  Glucose, capillary     Status: None   Collection Time: 08/06/17  7:58 AM  Result Value Ref Range   Glucose-Capillary 99 65 - 99 mg/dL  Glucose, capillary     Status: Abnormal    Collection Time: 08/06/17 11:44 AM  Result Value Ref Range   Glucose-Capillary 132 (H) 65 - 99 mg/dL      Component Value Date/Time   SDES ABSCESS BACK 08/05/2017 1628   SPECREQUEST NONE 08/05/2017 1628   CULT  08/05/2017 1628    FEW STAPHYLOCOCCUS AUREUS CULTURE REINCUBATED FOR BETTER GROWTH    REPTSTATUS PENDING 08/05/2017 1628   Ct Chest W Contrast  Result Date: 08/04/2017 CLINICAL DATA:  Left-sided chest wall pain. EXAM: CT CHEST WITH CONTRAST TECHNIQUE: Multidetector CT imaging of the chest was performed during intravenous contrast administration. CONTRAST:  5m ISOVUE-300 IOPAMIDOL (ISOVUE-300) INJECTION 61% COMPARISON:  Radiograph of same day.  CT scan of July 27, 2017. FINDINGS: Cardiovascular: No significant vascular findings. Normal heart size. No pericardial effusion. 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 Mediastinum/Nodes: No enlarged mediastinal, hilar, or axillary lymph nodes. Thyroid gland, trachea, and esophagus demonstrate no significant findings. Lungs/Pleura: No pneumothorax is noted. Minimal left pleural effusion is noted with adjacent subsegmental atelectasis. Stable 8 mm subpleural nodule is noted anteriorly in right middle lobe best seen on image number 87 of series 7. Upper Abdomen: Hepatic cirrhosis is noted. Moderate ascites is noted in visualized portion of upper abdomen. Possible lesion is noted anteriorly in left hepatic lobe. Musculoskeletal: No fracture is noted. Large fluid collection is seen tracking along the lateral and posterior portion of the left chest wall most consistent with abscess. Subscapular component measures 5.3 x 2.8 cm. Posterior component measures 10.6 x 1.6 cm. Inferior and lateral component measures 8.2 x 3.6 cm. IMPRESSION: Large fluid collection is seen tracking along the lateral and posterior portion of the left chest wall most consistent with abscess. Its inferior component measures 8.2 x 3.6 cm, and  more superior subscapular component measures 5.3 x 2.8 cm. Stable 8 mm right middle lobe nodule is noted. Non-contrast chest CT at 6-12 months is recommended. If the nodule is stable at time of repeat CT, then future CT at 18-24 months (from today's scan) is considered optional for low-risk patients, but is recommended for high-risk patients. This recommendation follows the consensus statement: Guidelines for Management of Incidental Pulmonary Nodules Detected on CT Images: From the Fleischner Society 2017; Radiology 2017; 284:228-243. Minimal left pleural effusion is noted with adjacent subsegmental atelectasis. Hepatic cirrhosis is noted with moderate ascites. Possible partially exophytic lesion is seen arising from left hepatic lobe; MRI of the liver with and without gadolinium is recommended on nonemergent basis to rule out neoplasm. Electronically Signed   By: JMarijo Conception M.D.   On: 08/04/2017 15:56   [place]  Result Date: 08/05/2017 INDICATION: 56year old male with a history of left shoulder abscess, subscapular EXAM: ULTRASOUND-GUIDED DRAINAGE LEFT POSTERIOR CHEST WALL ABSCESS MEDICATIONS: The patient is currently admitted to the hospital and receiving intravenous antibiotics. The antibiotics were administered within an appropriate time frame prior to the initiation of the procedure. ANESTHESIA/SEDATION: Fentanyl 2.0 mcg IV; Versed 100 mg IV Moderate Sedation Time:  20 minutes The patient was continuously monitored during the procedure by the interventional radiology nurse under my direct supervision. COMPLICATIONS: None PROCEDURE: Informed written consent was obtained from the patient after a thorough discussion of the procedural risks, benefits and alternatives. All questions were addressed. Maximal Sterile Barrier Technique was utilized including caps, mask, sterile gowns, sterile gloves, sterile drape, hand hygiene and skin antiseptic. A timeout was performed prior to the initiation of the  procedure. Patient positioned right decubitus on the ultrasound table. Scout ultrasound images were acquired with images stored sent to PACs. The patient was then prepped and draped in the usual sterile fashion, and the skin and subcutaneous tissues were generously infiltrated 1% lidocaine for local anesthesia. Small stab incision was made with 11 blade scalpel.  Using ultrasound guidance, trocar needle was advanced under ultrasound guidance into the fluid collection of the posterior left chest. Modified Seldinger technique was then used to place a 10 [place] drain into the fluid collection. Aspiration of frankly purulent an bloody fluid was performed with a sample sent to the lab for analysis. Ten [place] drain was sutured to the chest wall an attached to gravity drainage. Patient tolerated the procedure well and remained hemodynamically stable throughout. No complications were encountered and no significant blood loss. IMPRESSION: Status post ultrasound-guided drainage of posterior left chest wall abscess associated with the scapula. Sample was sent to the lab for culture. Signed, Dulcy Fanny. Earleen Newport, DO [facility] Radiology Electronically Signed   By: Corrie Mckusick D.O.   On: 08/05/2017 17:16   Recent Results (from the past 240 hour(s))  Blood culture (routine x 2)     Status: None (Preliminary result)   Collection Time: 08/04/17 11:45 AM  Result Value Ref Range Status   Specimen Description BLOOD LEFT ANTECUBITAL  Final   Special Requests   Final    BOTTLES DRAWN AEROBIC AND ANAEROBIC Blood Culture adequate volume   Culture NO GROWTH 2 DAYS  Final   Report Status PENDING  Incomplete  Blood culture (routine x 2)     Status: None (Preliminary result)   Collection Time: 08/04/17 11:52 AM  Result Value Ref Range Status   Specimen Description BLOOD LEFT WRIST  Final   Special Requests   Final    BOTTLES DRAWN AEROBIC AND ANAEROBIC Blood Culture adequate  volume   Culture NO GROWTH 2 DAYS  Final   Report Status PENDING  Incomplete  Urine culture     Status: Abnormal   Collection Time: 08/04/17  5:07 PM  Result Value Ref Range Status   Specimen Description URINE, CLEAN CATCH  Final   Special Requests NONE  Final   Culture >=100,000 COLONIES/mL YEAST (A)  Final   Report Status 08/05/2017 FINAL  Final  MRSA PCR Screening     Status: None   Collection Time: 08/04/17  6:56 PM  Result Value Ref Range Status   MRSA by PCR NEGATIVE NEGATIVE Final    Comment:        The GeneXpert MRSA Assay (FDA approved for NASAL specimens only), is one component of a comprehensive MRSA colonization surveillance program. It is not intended to diagnose MRSA infection nor to guide or monitor treatment for MRSA infections.   Aerobic/Anaerobic Culture (surgical/deep wound)     Status: None (Preliminary result)   Collection Time: 08/05/17  4:28 PM  Result Value Ref Range Status   Specimen Description ABSCESS BACK  Final   Special Requests NONE  Final   Gram Stain   Final    ABUNDANT WBC PRESENT, PREDOMINANTLY PMN FEW GRAM POSITIVE COCCI IN CLUSTERS    Culture   Final    FEW STAPHYLOCOCCUS AUREUS CULTURE REINCUBATED FOR BETTER GROWTH    Report Status PENDING  Incomplete    Microbiology: Recent Results (from the past 240 hour(s))  Blood culture (routine x 2)     Status: None (Preliminary result)   Collection Time: 08/04/17 11:45 AM  Result Value Ref Range Status   Specimen Description BLOOD LEFT ANTECUBITAL  Final   Special Requests   Final    BOTTLES DRAWN AEROBIC AND ANAEROBIC Blood Culture adequate volume   Culture NO GROWTH 2 DAYS  Final   Report Status PENDING  Incomplete  Blood culture (routine x  2)     Status: None (Preliminary result)   Collection Time: 08/04/17 11:52 AM  Result Value Ref Range Status   Specimen Description BLOOD LEFT WRIST  Final   Special Requests   Final    BOTTLES DRAWN AEROBIC AND ANAEROBIC Blood Culture adequate  volume   Culture NO GROWTH 2 DAYS  Final   Report Status PENDING  Incomplete  Urine culture     Status: Abnormal   Collection Time: 08/04/17  5:07 PM  Result Value Ref Range Status   Specimen Description URINE, CLEAN CATCH  Final   Special Requests NONE  Final   Culture >=100,000 COLONIES/mL YEAST (A)  Final   Report Status 08/05/2017 FINAL  Final  MRSA PCR Screening     Status: None   Collection Time: 08/04/17  6:56 PM  Result Value Ref Range Status   MRSA by PCR NEGATIVE NEGATIVE Final    Comment:        The GeneXpert MRSA Assay (FDA approved for NASAL specimens only), is one component of a comprehensive MRSA colonization surveillance program. It is not intended to diagnose MRSA infection nor to guide or monitor treatment for MRSA infections.   Aerobic/Anaerobic Culture (surgical/deep wound)     Status: None (Preliminary result)   Collection Time: 08/05/17  4:28 PM  Result Value Ref Range Status   Specimen Description ABSCESS BACK  Final   Special Requests NONE  Final   Gram Stain   Final    ABUNDANT WBC PRESENT, PREDOMINANTLY PMN FEW GRAM POSITIVE COCCI IN CLUSTERS    Culture   Final    FEW STAPHYLOCOCCUS AUREUS CULTURE REINCUBATED FOR BETTER GROWTH    Report Status PENDING  Incomplete    Bobby Rumpf, MD [facility] for Infectious [facility] [phone] 08/06/2017, 3:16 PM

## 2017-08-06 NOTE — Progress Notes (Signed)
CC: Multiple upper chest fluid collections of uncertain etiology  Subjective: Pt says his back feels better.  Worried and concerned about his abdominal and lower extremity swelling.  He thinks he was constipated before but now blames swelling on the antibiotics.   Objective: Vital signs in last 24 hours: Temp:  [98.2 F (36.8 C)-98.8 F (37.1 C)] 98.2 F (36.8 C) (09/28 0533) Pulse Rate:  [61-73] 70 (09/28 0941) Resp:  [11-21] 18 (09/28 0533) BP: (102-130)/(70-89) 130/89 (09/28 0941) SpO2:  [98 %-100 %] 100 % (09/28 0533) Last BM Date: 08/04/17 1400 IV 2125 urine 325 drain Afebrile VSS CA is low, but stable  Intake/Output from previous day: 09/27 0701 - 09/28 0700 In: 1408.3 [I.V.:958.3; IV Piggyback:450] Out: 2450 [Urine:2125; Drains:325] Intake/Output this shift: Total I/O In: -  Out: 200 [Urine:200]  General appearance: alert, cooperative and no distress Resp: clear to auscultation bilaterally. Fluid collections back and axilla are resolved.  Drainage from IR drain is bloody purulent appearing fluid.   GI: still distended and uncomfortable Extremities: ongoing lower extremity edema. BAcK:  The fluid collection on the back and the left axilla are no longer there.  I found one on the right near the scapula on admit, but I do not feel it now.    Lab Results:   Recent Labs  08/04/17 1145 08/04/17 1206 08/05/17 0459  WBC 6.4  --  5.4  HGB 10.4* 12.6* 9.7*  HCT 29.7* 37.0* 28.5*  PLT 103*  --  106*    BMET  Recent Labs  08/05/17 0459 08/06/17 0411  NA 132* 132*  K 3.4* 3.5  CL 102 101  CO2 27 28  GLUCOSE 109* 181*  BUN <5* <5*  CREATININE 0.56* 0.68  CALCIUM 7.5* 7.6*   PT/INR  Recent Labs  08/05/17 0459  LABPROT 16.3*  INR 1.32     Recent Labs Lab 07/31/17 08/04/17 1145 08/05/17 0459  AST 130* 92* 85*  ALT 60* 36 34  ALKPHOS 183* 106 105  BILITOT  --  1.9* 1.8*  PROT  --  9.0* 8.0  ALBUMIN  --  1.5* 1.3*     Lipase  No  results found for: LIPASE   Medications: . feeding supplement (PRO-STAT SUGAR FREE 64)  30 mL Oral BID  . insulin aspart  0-9 Units Subcutaneous TID WC  . insulin glargine  14 Units Subcutaneous QHS  . lidocaine-EPINEPHrine  20 mL Intradermal Once  . lisinopril  5 mg Oral Daily  . metoprolol tartrate  12.5 mg Oral BID  . multivitamin with minerals  1 tablet Oral Daily  . simvastatin  40 mg Oral Daily  . sodium chloride flush  5 mL Intravenous Q8H   . sodium chloride 100 mL/hr at 08/05/17 0228  . piperacillin-tazobactam (ZOSYN)  IV Stopped (08/06/17 0912)  . vancomycin Stopped (08/06/17 1041)   Anti-infectives    Start     Dose/Rate Route Frequency Ordered Stop   08/05/17 0200  vancomycin (VANCOCIN) IVPB 750 mg/150 ml premix     750 mg 150 mL/hr over 60 Minutes Intravenous Every 8 hours 08/04/17 1549     08/04/17 2200  piperacillin-tazobactam (ZOSYN) IVPB 3.375 g     3.375 g 12.5 mL/hr over 240 Minutes Intravenous Every 8 hours 08/04/17 1549     08/04/17 1445  vancomycin (VANCOCIN) 1,250 mg in sodium chloride 0.9 % 250 mL IVPB     1,250 mg 166.7 mL/hr over 90 Minutes Intravenous  Once 08/04/17 1434 08/04/17  1835   08/04/17 1445  piperacillin-tazobactam (ZOSYN) IVPB 3.375 g     3.375 g 100 mL/hr over 30 Minutes Intravenous  Once 08/04/17 1434 08/04/17 1627      Assessment/Plan Fluid collection left chest wall below scapular tip and second collection deep scapular body IR drain placement 08/05/17 Abdominal distention with normal LFTs. ? Cirrhosis ? Lower extremity edema Recent TURP and unroofing of prosthetic abscess. Sepsis Diabetes FEN:  IV fluids/carb modified ID:  Vancomycin/Zosyn - 08/04/17 =>> day 3 DVT:  SCD's Only  Plan:  From our standpoint continue IR drain therapy and antibiotics.  Would recommend ID see him and try to figure out what is causing this.  He was recently hospitalized for TURP and prostate abscess unroofed during the procedure.  IR drain has prevented  him from having a large surgical abscess open wound.  I have talked with Dr. Ninetta Lights.        LOS: 2 days    Clarice Bonaventure 08/06/2017 [phone]

## 2017-08-06 NOTE — Progress Notes (Signed)
Pt. Refused IV antibx Cefazolin because he felt he was getting too much fluid. I educated on importance and he stated understanding but asked me too wait to give iv fluids.

## 2017-08-07 DIAGNOSIS — M799 Soft tissue disorder, unspecified: Secondary | ICD-10-CM

## 2017-08-07 LAB — BASIC METABOLIC PANEL
Anion gap: 3 — ABNORMAL LOW (ref 5–15)
BUN: 5 mg/dL — AB (ref 6–20)
CO2: 27 mmol/L (ref 22–32)
CREATININE: 0.56 mg/dL — AB (ref 0.61–1.24)
Calcium: 7.4 mg/dL — ABNORMAL LOW (ref 8.9–10.3)
Chloride: 104 mmol/L (ref 101–111)
GFR calc Af Amer: >60 mL/min (ref >60)
Glucose, Bld: 137 mg/dL — ABNORMAL HIGH (ref 65–99)
Potassium: 3.1 mmol/L — ABNORMAL LOW (ref 3.5–5.1)
Sodium: 134 mmol/L — ABNORMAL LOW (ref 135–145)

## 2017-08-07 LAB — GLUCOSE, CAPILLARY
GLUCOSE-CAPILLARY: 171 mg/dL — AB (ref 65–99)
Glucose-Capillary: 92 mg/dL (ref 65–99)
Glucose-Capillary: 247 mg/dL — ABNORMAL HIGH (ref 65–99)
Glucose-Capillary: 165 mg/dL — ABNORMAL HIGH (ref 65–99)

## 2017-08-07 NOTE — Progress Notes (Signed)
Subjective/Chief Complaint: No complaints   Objective: Vital signs in last 24 hours: Temp:  [98.2 F (36.8 C)-98.3 F (36.8 C)] 98.3 F (36.8 C) (09/29 0527) Pulse Rate:  [61-73] 61 (09/29 0527) Resp:  [18-20] 18 (09/29 0527) BP: (103-130)/(66-89) 110/71 (09/29 0527) SpO2:  [100 %] 100 % (09/29 0527) Last BM Date: 08/04/17  Intake/Output from previous day: 09/28 0701 - 09/29 0700 In: 910 [P.O.:360; IV Piggyback:550] Out: 2630 [Urine:2480; Drains:150] Intake/Output this shift: Total I/O In: -  Out: 200 [Urine:200]  General appearance: alert and cooperative Resp: clear to auscultation bilaterally Cardio: regular rate and rhythm GI: soft, non-tender; bowel sounds normal; no masses,  no organomegaly  Lab Results:   Recent Labs  08/04/17 1145 08/04/17 1206 08/05/17 0459  WBC 6.4  --  5.4  HGB 10.4* 12.6* 9.7*  HCT 29.7* 37.0* 28.5*  PLT 103*  --  106*   BMET  Recent Labs  08/06/17 0411 08/07/17 0326  NA 132* 134*  K 3.5 3.1*  CL 101 104  CO2 28 27  GLUCOSE 181* 137*  BUN <5* 5*  CREATININE 0.68 0.56*  CALCIUM 7.6* 7.4*   PT/INR  Recent Labs  08/05/17 0459  LABPROT 16.3*  INR 1.32   ABG No results for input(s): PHART, HCO3 in the last 72 hours.  Invalid input(s): PCO2, PO2  Studies/Results: US Aspiration  Result Date: 08/05/2017 INDICATION: 56 year old male with a history of left shoulder abscess, subscapular EXAM: ULTRASOUND-GUIDED DRAINAGE LEFT POSTERIOR CHEST WALL ABSCESS MEDICATIONS: The patient is currently admitted to the hospital and receiving intravenous antibiotics. The antibiotics were administered within an appropriate time frame prior to the initiation of the procedure. ANESTHESIA/SEDATION: Fentanyl 2.0 mcg IV; Versed 100 mg IV Moderate Sedation Time:  20 minutes The patient was continuously monitored during the procedure by the interventional radiology nurse under my direct supervision. COMPLICATIONS: None PROCEDURE: Informed  written consent was obtained from the patient after a thorough discussion of the procedural risks, benefits and alternatives. All questions were addressed. Maximal Sterile Barrier Technique was utilized including caps, mask, sterile gowns, sterile gloves, sterile drape, hand hygiene and skin antiseptic. A timeout was performed prior to the initiation of the procedure. Patient positioned right decubitus on the ultrasound table. Scout ultrasound images were acquired with images stored sent to PACs. The patient was then prepped and draped in the usual sterile fashion, and the skin and subcutaneous tissues were generously infiltrated 1% lidocaine for local anesthesia. Small stab incision was made with 11 blade scalpel. Using ultrasound guidance, trocar needle was advanced under ultrasound guidance into the fluid collection of the posterior left chest. Modified Seldinger technique was then used to place a 10 [place] drain into the fluid collection. Aspiration of frankly purulent an bloody fluid was performed with a sample sent to the lab for analysis. Ten [place] drain was sutured to the chest wall an attached to gravity drainage. Patient tolerated the procedure well and remained hemodynamically stable throughout. No complications were encountered and no significant blood loss. IMPRESSION: Status post ultrasound-guided drainage of posterior left chest wall abscess associated with the scapula. Sample was sent to the lab for culture. Signed, Yvone Neu. Loreta Ave, DO [facility] Radiology Electronically Signed   By: Gilmer Mor D.O.   On: 08/05/2017 17:16    Anti-infectives: Anti-infectives    Start     Dose/Rate Route Frequency Ordered Stop   08/06/17 1700  ceFAZolin (ANCEF) IVPB 2g/100 mL premix     2  g 200 mL/hr over 30 Minutes Intravenous Every 8 hours 08/06/17 1547     08/05/17 0200  vancomycin (VANCOCIN) IVPB 750 mg/150 ml premix  Status:  Discontinued     750  mg 150 mL/hr over 60 Minutes Intravenous Every 8 hours 08/04/17 1549 08/06/17 1530   08/04/17 2200  piperacillin-tazobactam (ZOSYN) IVPB 3.375 g  Status:  Discontinued     3.375 g 12.5 mL/hr over 240 Minutes Intravenous Every 8 hours 08/04/17 1549 08/06/17 1530   08/04/17 1445  vancomycin (VANCOCIN) 1,250 mg in sodium chloride 0.9 % 250 mL IVPB     1,250 mg 166.7 mL/hr over 90 Minutes Intravenous  Once 08/04/17 1434 08/04/17 1835   08/04/17 1445  piperacillin-tazobactam (ZOSYN) IVPB 3.375 g     3.375 g 100 mL/hr over 30 Minutes Intravenous  Once 08/04/17 1434 08/04/17 1627      Assessment/Plan: s/p * No surgery found * Advance diet  Continue drain ID recs [email or web address] No plan for surgery at this point  LOS: 3 days    TOTH III,PAUL S 08/07/2017

## 2017-08-07 NOTE — Progress Notes (Signed)
Pt wants his dressing around the tube to be change this morning per pt it was too early to change it at 0500, I have told vicky to change it whenever pt is ready for it

## 2017-08-07 NOTE — Progress Notes (Signed)
INFECTIOUS DISEASE PROGRESS NOTE  ID: Cameron Coffey is a 56 y.o. male with  Active Problems:   Essential hypertension   Dyslipidemia associated with type 2 diabetes mellitus (HCC)   Type II diabetes with long term use of insulin (HCC)   MSSA (methicillin susceptible Staphylococcus aureus) septicemia (HCC)   Mass on back   Acute bilateral back pain   Constipation, chronic   Bilateral nephrolithiasis   Pain in scapula   Abscess of scapular region   Pressure injury of skin  Subjective: C/o LE edema, soaking feet in bed pan.   Abtx:  Anti-infectives    Start     Dose/Rate Route Frequency Ordered Stop   08/06/17 1700  ceFAZolin (ANCEF) IVPB 2g/100 mL premix     2 g 200 mL/hr over 30 Minutes Intravenous Every 8 hours 08/06/17 1547     08/05/17 0200  vancomycin (VANCOCIN) IVPB 750 mg/150 ml premix  Status:  Discontinued     750 mg 150 mL/hr over 60 Minutes Intravenous Every 8 hours 08/04/17 1549 08/06/17 1530   08/04/17 2200  piperacillin-tazobactam (ZOSYN) IVPB 3.375 g  Status:  Discontinued     3.375 g 12.5 mL/hr over 240 Minutes Intravenous Every 8 hours 08/04/17 1549 08/06/17 1530   08/04/17 1445  vancomycin (VANCOCIN) 1,250 mg in sodium chloride 0.9 % 250 mL IVPB     1,250 mg 166.7 mL/hr over 90 Minutes Intravenous  Once 08/04/17 1434 08/04/17 1835   08/04/17 1445  piperacillin-tazobactam (ZOSYN) IVPB 3.375 g     3.375 g 100 mL/hr over 30 Minutes Intravenous  Once 08/04/17 1434 08/04/17 1627      Medications:  Scheduled: . feeding supplement (PRO-STAT SUGAR FREE 64)  30 mL Oral BID  . insulin aspart  0-9 Units Subcutaneous TID WC  . insulin glargine  14 Units Subcutaneous QHS  . lidocaine-EPINEPHrine  20 mL Intradermal Once  . lisinopril  5 mg Oral Daily  . metoprolol tartrate  12.5 mg Oral BID  . multivitamin with minerals  1 tablet Oral Daily  . simvastatin  40 mg Oral Daily  . sodium chloride flush  5 mL Intravenous Q8H    Objective: Vital signs in last 24  hours: Temp:  [98.2 F (36.8 C)-98.3 F (36.8 C)] 98.3 F (36.8 C) (09/29 0527) Pulse Rate:  [61-73] 61 (09/29 0527) Resp:  [18-20] 18 (09/29 0527) BP: (103-130)/(66-89) 110/71 (09/29 0527) SpO2:  [100 %] 100 % (09/29 0527)   General appearance: alert, cooperative and no distress Resp: clear to auscultation bilaterally Cardio: regular rate and rhythm GI: normal findings: bowel sounds normal and soft, non-tender Extremities: edema 3+ edema Bilaterally Skin: drain in L mid back. purulent, bloody fluid in bag  Lab Results  Recent Labs  08/05/17 0459 08/06/17 0411 08/07/17 0326  WBC 5.4  --   --   HGB 9.7*  --   --   HCT 28.5*  --   --   NA 132* 132* 134*  K 3.4* 3.5 3.1*  CL 102 101 104  CO2 BUN <5* <5* 5*  CREATININE 0.56* 0.68 0.56*   Liver Panel  Recent Labs  08/05/17 0459  PROT 8.0  ALBUMIN 1.3*  AST 85*  ALT 34  ALKPHOS 105  BILITOT 1.8*   Sedimentation Rate No results for input(s): ESRSEDRATE in the last 72 hours. C-Reactive Protein No results for input(s): CRP in the last 72 hours.  Cameron SALAZARcent Results (from the past 240 hour(s))  Blood culture (routine x 2)     Status: None (Preliminary result)   Collection Time: 08/04/17 11:45 AM  Result Value Ref Range Status   Specimen Description BLOOD LEFT ANTECUBITAL  Final   Special Requests   Final    BOTTLES DRAWN AEROBIC AND ANAEROBIC Blood Culture adequate volume   Culture NO GROWTH 2 DAYS  Final   Report Status PENDING  Incomplete  Blood culture (routine x 2)     Status: None (Preliminary result)   Collection Time: 08/04/17 11:52 AM  Result Value Ref Range Status   Specimen Description BLOOD LEFT WRIST  Final   Special Requests   Final    BOTTLES DRAWN AEROBIC AND ANAEROBIC Blood Culture adequate volume   Culture NO GROWTH 2 DAYS  Final   Report Status PENDING  Incomplete  Urine culture     Status: Abnormal   Collection Time: 08/04/17  5:07 PM  Result Value Ref Range Status    Specimen Description URINE, CLEAN CATCH  Final   Special Requests NONE  Final   Culture >=100,000 COLONIES/mL YEAST (A)  Final   Report Status 08/05/2017 FINAL  Final  MRSA PCR Screening     Status: None   Collection Time: 08/04/17  6:56 PM  Result Value Ref Range Status   MRSA by PCR NEGATIVE NEGATIVE Final    Comment:        The GeneXpert MRSA Assay (FDA approved for NASAL specimens only), is one component of a comprehensive MRSA colonization surveillance program. It is not intended to diagnose MRSA infection nor to guide or monitor treatment for MRSA infections.   Aerobic/Anaerobic Culture (surgical/deep wound)     Status: None (Preliminary result)   Collection Time: 08/05/17  4:28 PM  Result Value Ref Range Status   Specimen Description ABSCESS BACK  Final   Special Requests NONE  Final   Gram Stain   Final    ABUNDANT WBC PRESENT, PREDOMINANTLY PMN FEW GRAM POSITIVE COCCI IN CLUSTERS    Culture   Final    FEW STAPHYLOCOCCUS AUREUS CULTURE REINCUBATED FOR BETTER GROWTH    Report Status PENDING  Incomplete    Studies/Results: US Aspiration  Result Date: 08/05/2017 INDICATION: 56 year old male with a history of left shoulder abscess, subscapular EXAM: ULTRASOUND-GUIDED DRAINAGE LEFT POSTERIOR CHEST WALL ABSCESS MEDICATIONS: The patient is currently admitted to the hospital and receiving intravenous antibiotics. The antibiotics were administered within an appropriate time frame prior to the initiation of the procedure. ANESTHESIA/SEDATION: Fentanyl 2.0 mcg IV; Versed 100 mg IV Moderate Sedation Time:  20 minutes The patient was continuously monitored during the procedure by the interventional radiology nurse under my direct supervision. COMPLICATIONS: None PROCEDURE: Informed written consent was obtained from the patient after a thorough discussion of the procedural risks, benefits and alternatives. All questions were addressed. Maximal Sterile Barrier Technique was utilized  including caps, mask, sterile gowns, sterile gloves, sterile drape, hand hygiene and skin antiseptic. A timeout was performed prior to the initiation of the procedure. Patient positioned right decubitus on the ultrasound table. Scout ultrasound images were acquired with images stored sent to PACs. The patient was then prepped and draped in the usual sterile fashion, and the skin and subcutaneous tissues were generously infiltrated 1% lidocaine for local anesthesia. Small stab incision was made with 11 blade scalpel. Using ultrasound guidance, trocar needle was advanced under ultrasound guidance into the fluid collection of the posterior left chest. Modified Seldinger technique was then used to place a 10 [place]  drain into the fluid collection. Aspiration of frankly purulent an bloody fluid was performed with a sample sent to the lab for analysis. Ten [place] drain was sutured to the chest wall an attached to gravity drainage. Patient tolerated the procedure well and remained hemodynamically stable throughout. No complications were encountered and no significant blood loss. IMPRESSION: Status post ultrasound-guided drainage of posterior left chest wall abscess associated with the scapula. Sample was sent to the lab for culture. Signed, Yvone Neu. Loreta Ave, DO [facility] Radiology Electronically Signed   By: Gilmer Mor D.O.   On: 08/05/2017 17:16     Assessment/Plan: Scapular abscess, metastatic, MSSA Recent MSSA bacteremia Recent prostatic abscess DM2 LE edema  Total days of antibiotics: 4 (vanco/zosyn --> ancef)         Continue ancef Check TEE Cr stable BCx ngtd (9-26)  Johny Sax MD, FACP Infectious Diseases (pager) [phone] [email or web address] 08/07/2017, 12:35 PM  LOS: 3 days

## 2017-08-07 NOTE — Progress Notes (Signed)
PROGRESS NOTE    [name]  ZOX:[id number] DOB: [birth year] DOA: 08/04/2017 PCP: Lorre Munroe, NP    Brief Narrative:  Cameron Coffey is a 56 y.o. male who presents with subscapular abscess that will likely need IR for drainage. IR consult placed. Continue current ABX regimen. Additionally Urology to see pt due to persistent pain from recent TURP.  Assessment & Plan:     Abscess of scapular region - IR was consulted and patient is status post ultrasound guided drainage of abscess. - Will follow-up with culture results for now antibiotics per ID recommendations. Per ID: Continue ancef Check TEE Cr stable BCx ngtd (9-26)  Prostatic abscess - consulted Urology and recommended the following: Irrigated Foley catheter at this time. if he begins to have voiding complaints or mild retention of urine, he can be started on Flomax 0.4 mg daily. No Urological intervention recommended. Dr. Sampson Goon who recommended IV Ancef every 8 hours until October 29  Active Problems:   Essential hypertension - Patient is on lisinopril -Patient is on metoprolol    Dyslipidemia associated with type 2 diabetes mellitus (HCC) -  Pt is on Statin, no chest pain reported. stable    Type II diabetes with long term use of insulin (HCC) - Pt is on carb modified diet and lantus as well as SSI    MSSA (methicillin susceptible Staphylococcus aureus) septicemia (HCC) - Pt on vancomycin    Acute bilateral back pain - secondary to abscess - continue supportive therapy. Pt is s/p procedure listed above and below    Constipation, chronic - stable on dulcolax    Bilateral nephrolithiasis  DVT prophylaxis: SCD's Code Status: Full Family Communication: None at bedside.  Disposition Plan: Pending recommendations from specialist   Consultants:   Radiology  General surgery  Urology  Infectious disease   Procedures: US guided drainage of left posterior back/shoulder  abscess  Antimicrobials: Zosyn, Vancomycin  Subjective: The patient has no new complaints.  Objective: Vitals:   08/06/17 1333 08/06/17 2220 08/07/17 0527 08/07/17 1440  BP: 130/89 103/66 110/71 (!) 99/58  Pulse: 72 73 61 68  Resp: Temp: 98.2 F (36.8 C) 98.3 F (36.8 C) 98.3 F (36.8 C) 98.2 F (36.8 C)  TempSrc: Oral Oral Oral Oral  SpO2: 100% 100% 100% 100%  Weight:      Height:        Intake/Output Summary (Last 24 hours) at 08/07/17 1722 Last data filed at 08/07/17 1714  Gross per 24 hour  Intake              475 ml  Output             1030 ml  Net             -555 ml   Filed Weights   08/04/17 0956 08/04/17 1757  Weight: 68 kg (150 lb) 76.2 kg (168 lb)    Examination:  General exam: Appears calm and comfortable, in nad.   Respiratory system: Clear to auscultation. Respiratory effort normal. Equal chest rise. Cardiovascular system: S1 & S2 heard, RRR. No JVD, murmurs, rubs, gallops or clicks Gastrointestinal system: Abdomen is nondistended, soft and nontender. No organomegaly or masses felt. Normal bowel sounds heard. Central nervous system: Alert and oriented. No focal neurological deficits. Extremities: Symmetric 5 x 5 power. Skin: No rashes or ulcers, warm and dry, pt has drain in place with serosanguinous fluid in bag Psychiatry:  Mood & affect appropriate.  Data Reviewed: I have personally reviewed following labs and imaging studies  CBC:  Recent Labs Lab 08/04/17 1145 08/04/17 1206 08/05/17 0459  WBC 6.4  --  5.4  NEUTROABS 4.1  --   --   HGB 10.4* 12.6* 9.7*  HCT 29.7* 37.0* 28.5*  MCV 88.1  --  88.2  PLT 103*  --  106*   Basic Metabolic Panel:  Recent Labs Lab 08/04/17 1145 08/04/17 1206 08/05/17 0459 08/06/17 0411 08/07/17 0326  NA 131* 137 132* 132* 134*  K 3.9 4.0 3.4* 3.5 3.1*  CL 101 98* 102 101 104  CO2 27  --  GLUCOSE 121* 123* 109* 181* 137*  BUN 5* 6 <5* <5* 5*  CREATININE 0.67 0.50* 0.56*  0.68 0.56*  CALCIUM 7.9*  --  7.5* 7.6* 7.4*   GFR: Estimated Creatinine Clearance: 104.3 mL/min (A) (by C-G formula based on SCr of 0.56 mg/dL (L)). Liver Function Tests:  Recent Labs Lab 08/04/17 1145 08/05/17 0459  AST 92* 85*  ALT 36 34  ALKPHOS 106 105  BILITOT 1.9* 1.8*  PROT 9.0* 8.0  ALBUMIN 1.5* 1.3*   No results for input(s): LIPASE, AMYLASE in the last 168 hours. No results for input(s): AMMONIA in the last 168 hours. Coagulation Profile:  Recent Labs Lab 08/05/17 0459  INR 1.32   Cardiac Enzymes: No results for input(s): CKTOTAL, CKMB, CKMBINDEX, TROPONINI in the last 168 hours. BNP (last 3 results) No results for input(s): PROBNP in the last 8760 hours. HbA1C: No results for input(s): HGBA1C in the last 72 hours. CBG:  Recent Labs Lab 08/06/17 1705 08/06/17 2218 08/07/17 0731 08/07/17 1243 08/07/17 1650  GLUCAP 181* 204* 92 247* 165*   Lipid Profile: No results for input(s): CHOL, HDL, LDLCALC, TRIG, CHOLHDL, LDLDIRECT in the last 72 hours. Thyroid Function Tests: No results for input(s): TSH, T4TOTAL, FREET4, T3FREE, THYROIDAB in the last 72 hours. Anemia Panel: No results for input(s): VITAMINB12, FOLATE, FERRITIN, TIBC, IRON, RETICCTPCT in the last 72 hours. Sepsis Labs:  Recent Labs Lab 08/04/17 1206 08/04/17 1616  LATICACIDVEN 2.47* 2.65*    Recent Results (from the past 240 hour(s))  Blood culture (routine x 2)     Status: None (Preliminary result)   Collection Time: 08/04/17 11:45 AM  Result Value Ref Range Status   Specimen Description BLOOD LEFT ANTECUBITAL  Final   Special Requests   Final    BOTTLES DRAWN AEROBIC AND ANAEROBIC Blood Culture adequate volume   Culture NO GROWTH 3 DAYS  Final   Report Status PENDING  Incomplete  Blood culture (routine x 2)     Status: None (Preliminary result)   Collection Time: 08/04/17 11:52 AM  Result Value Ref Range Status   Specimen Description BLOOD LEFT WRIST  Final   Special Requests    Final    BOTTLES DRAWN AEROBIC AND ANAEROBIC Blood Culture adequate volume   Culture NO GROWTH 3 DAYS  Final   Report Status PENDING  Incomplete  Urine culture     Status: Abnormal   Collection Time: 08/04/17  5:07 PM  Result Value Ref Range Status   Specimen Description URINE, CLEAN CATCH  Final   Special Requests NONE  Final   Culture >=100,000 COLONIES/mL YEAST (A)  Final   Report Status 08/05/2017 FINAL  Final  MRSA PCR Screening     Status: None   Collection Time: 08/04/17  6:56 PM  Result Value Ref Range Status   MRSA by PCR  NEGATIVE NEGATIVE Final    Comment:        The GeneXpert MRSA Assay (FDA approved for NASAL specimens only), is one component of a comprehensive MRSA colonization surveillance program. It is not intended to diagnose MRSA infection nor to guide or monitor treatment for MRSA infections.   Aerobic/Anaerobic Culture (surgical/deep wound)     Status: None (Preliminary result)   Collection Time: 08/05/17  4:28 PM  Result Value Ref Range Status   Specimen Description ABSCESS BACK  Final   Special Requests NONE  Final   Gram Stain   Final    ABUNDANT WBC PRESENT, PREDOMINANTLY PMN FEW GRAM POSITIVE COCCI IN CLUSTERS    Culture   Final    FEW STAPHYLOCOCCUS AUREUS SUSCEPTIBILITIES TO FOLLOW    Report Status PENDING  Incomplete         Radiology Studies: No results found.      Scheduled Meds: . feeding supplement (PRO-STAT SUGAR FREE 64)  30 mL Oral BID  . insulin aspart  0-9 Units Subcutaneous TID WC  . insulin glargine  14 Units Subcutaneous QHS  . lidocaine-EPINEPHrine  20 mL Intradermal Once  . lisinopril  5 mg Oral Daily  . metoprolol tartrate  12.5 mg Oral BID  . multivitamin with minerals  1 tablet Oral Daily  . simvastatin  40 mg Oral Daily  . sodium chloride flush  5 mL Intravenous Q8H   Continuous Infusions: .  ceFAZolin (ANCEF) IV Stopped (08/07/17 1002)     LOS: 3 days    Time spent: > 35 minutes  Penny Pia,  MD [facility] Pager [phone]  If 7PM-7AM, please contact night-coverage [email or web address] Password [email or web address] 08/07/2017, 5:22 PM

## 2017-08-08 LAB — BASIC METABOLIC PANEL
ANION GAP: 5 (ref 5–15)
BUN: 7 mg/dL (ref 6–20)
CHLORIDE: 102 mmol/L (ref 101–111)
CO2: 28 mmol/L (ref 22–32)
CREATININE: 0.62 mg/dL (ref 0.61–1.24)
Calcium: 7.6 mg/dL — ABNORMAL LOW (ref 8.9–10.3)
GFR calc non Af Amer: >60 mL/min (ref >60)
GLUCOSE: 121 mg/dL — AB (ref 65–99)
Potassium: 3.4 mmol/L — ABNORMAL LOW (ref 3.5–5.1)
Sodium: 135 mmol/L (ref 135–145)

## 2017-08-08 LAB — GLUCOSE, CAPILLARY
GLUCOSE-CAPILLARY: 163 mg/dL — AB (ref 65–99)
GLUCOSE-CAPILLARY: 163 mg/dL — AB (ref 65–99)
GLUCOSE-CAPILLARY: 212 mg/dL — AB (ref 65–99)
Glucose-Capillary: 103 mg/dL — ABNORMAL HIGH (ref 65–99)

## 2017-08-08 MED ORDER — DOCUSATE SODIUM 100 MG PO CAPS
200.0000 mg | ORAL_CAPSULE | Freq: Once | ORAL | Status: AC
  Administered 2017-08-08: 200 mg via ORAL
  Filled 2017-08-08: qty 2

## 2017-08-08 MED ORDER — DOCUSATE SODIUM 100 MG PO CAPS
100.0000 mg | ORAL_CAPSULE | Freq: Two times a day (BID) | ORAL | Status: DC
  Administered 2017-08-09 – 2017-08-13 (×6): 100 mg via ORAL
  Filled 2017-08-08 (×9): qty 1

## 2017-08-08 NOTE — Progress Notes (Signed)
PROGRESS NOTE    Cameron Coffey  ZOX:[id number] DOB: 1961/03/02 DOA: 08/04/2017 PCP: Lorre Munroe, NP    Brief Narrative:  Cameron Coffey is a 56 y.o. male who presents with subscapular abscess that will likely need IR for drainage. IR consult placed. Continue current ABX regimen. Additionally Urology to see pt due to persistent pain from recent TURP.  Assessment & Plan:     Abscess of scapular region - IR was consulted and patient is status post ultrasound guided drainage of abscess. - Will follow-up with culture results for now antibiotics per ID recommendations. Per ID: Continue ancef Check TEE Cr stable BCx ngtd (9-26)  Prostatic abscess - consulted Urology and recommended the following: Irrigated Foley catheter at this time. if he begins to have voiding complaints or mild retention of urine, he can be started on Flomax 0.4 mg daily. No Urological intervention recommended. Dr. Sampson Goon who recommended IV Ancef every 8 hours until October 29  Active Problems:   Essential hypertension - Relatively well controlled on lisinopril and metoprolol    Dyslipidemia associated with type 2 diabetes mellitus (HCC) -  Pt is on Statin, no chest pain reported. stable    Type II diabetes with long term use of insulin (HCC) - Pt is on carb modified diet and lantus as well as SSI    MSSA (methicillin susceptible Staphylococcus aureus) septicemia (HCC) - Pt on vancomycin    Acute bilateral back pain - secondary to abscess - continue supportive therapy. Pt is s/p procedure listed above and below    Constipation, chronic - stable on dulcolax    Bilateral nephrolithiasis  Hypokalemia - mild will observe for now.  DVT prophylaxis: SCD's Code Status: Full Family Communication: None at bedside.  Disposition Plan: Pending recommendations from specialist   Consultants:   Radiology  General surgery  Urology  Infectious disease   Procedures: US guided drainage of left  posterior back/shoulder abscess  Antimicrobials: Zosyn, Vancomycin  Subjective: No new complaints reported.  Objective: Vitals:   08/07/17 1440 08/07/17 2214 08/08/17 0500 08/08/17 1614  BP: (!) 99/58 109/73 (!) 102/59 109/67  Pulse: 68 74 61 60  Resp: Temp: 98.2 F (36.8 C) 98.4 F (36.9 C) 99.1 F (37.3 C) 98.3 F (36.8 C)  TempSrc: Oral Oral Oral Oral  SpO2: 100% 100% 100% 100%  Weight:      Height:        Intake/Output Summary (Last 24 hours) at 08/08/17 1633 Last data filed at 08/08/17 0700  Gross per 24 hour  Intake              730 ml  Output             1145 ml  Net             -415 ml   Filed Weights   08/04/17 0956 08/04/17 1757  Weight: 68 kg (150 lb) 76.2 kg (168 lb)    Examination:Exam unchanged when compared to 08/07/2017  General exam: Appears calm and comfortable, in nad.   Respiratory system: Clear to auscultation. Respiratory effort normal. Equal chest rise. Cardiovascular system: S1 & S2 heard, RRR. No JVD, murmurs, rubs, gallops or clicks Gastrointestinal system: Abdomen is nondistended, soft and nontender. No organomegaly or masses felt. Normal bowel sounds heard. Central nervous system: Alert and oriented. No focal neurological deficits. Extremities: Symmetric 5 x 5 power. Skin: No rashes or ulcers, warm and dry, pt has drain in place  with serosanguinous fluid in bag Psychiatry:  Mood & affect appropriate.     Data Reviewed: I have personally reviewed following labs and imaging studies  CBC:  Recent Labs Lab 08/04/17 1145 08/04/17 1206 08/05/17 0459  WBC 6.4  --  5.4  NEUTROABS 4.1  --   --   HGB 10.4* 12.6* 9.7*  HCT 29.7* 37.0* 28.5*  MCV 88.1  --  88.2  PLT 103*  --  106*   Basic Metabolic Panel:  Recent Labs Lab 08/04/17 1145 08/04/17 1206 08/05/17 0459 08/06/17 0411 08/07/17 0326 08/08/17 0346  NA 131* 137 132* 132* 134* 135  K 3.9 4.0 3.4* 3.5 3.1* 3.4*  CL 101 98* 102 101 104 102  CO2 27  --  GLUCOSE 121* 123* 109* 181* 137* 121*  BUN 5* 6 <5* <5* 5* 7  CREATININE 0.67 0.50* 0.56* 0.68 0.56* 0.62  CALCIUM 7.9*  --  7.5* 7.6* 7.4* 7.6*   GFR: Estimated Creatinine Clearance: 104.3 mL/min (by C-G formula based on SCr of 0.62 mg/dL). Liver Function Tests:  Recent Labs Lab 08/04/17 1145 08/05/17 0459  AST 92* 85*  ALT 36 34  ALKPHOS 106 105  BILITOT 1.9* 1.8*  PROT 9.0* 8.0  ALBUMIN 1.5* 1.3*   No results for input(s): LIPASE, AMYLASE in the last 168 hours. No results for input(s): AMMONIA in the last 168 hours. Coagulation Profile:  Recent Labs Lab 08/05/17 0459  INR 1.32   Cardiac Enzymes: No results for input(s): CKTOTAL, CKMB, CKMBINDEX, TROPONINI in the last 168 hours. BNP (last 3 results) No results for input(s): PROBNP in the last 8760 hours. HbA1C: No results for input(s): HGBA1C in the last 72 hours. CBG:  Recent Labs Lab 08/07/17 1243 08/07/17 1650 08/07/17 2105 08/08/17 0808 08/08/17 1150  GLUCAP 247* 165* 171* 103* 163*   Lipid Profile: No results for input(s): CHOL, HDL, LDLCALC, TRIG, CHOLHDL, LDLDIRECT in the last 72 hours. Thyroid Function Tests: No results for input(s): TSH, T4TOTAL, FREET4, T3FREE, THYROIDAB in the last 72 hours. Anemia Panel: No results for input(s): VITAMINB12, FOLATE, FERRITIN, TIBC, IRON, RETICCTPCT in the last 72 hours. Sepsis Labs:  Recent Labs Lab 08/04/17 1206 08/04/17 1616  LATICACIDVEN 2.47* 2.65*    Recent Results (from the past 240 hour(s))  Blood culture (routine x 2)     Status: None (Preliminary result)   Collection Time: 08/04/17 11:45 AM  Result Value Ref Range Status   Specimen Description BLOOD LEFT ANTECUBITAL  Final   Special Requests   Final    BOTTLES DRAWN AEROBIC AND ANAEROBIC Blood Culture adequate volume   Culture NO GROWTH 4 DAYS  Final   Report Status PENDING  Incomplete  Blood culture (routine x 2)     Status: None (Preliminary result)   Collection Time: 08/04/17  11:52 AM  Result Value Ref Range Status   Specimen Description BLOOD LEFT WRIST  Final   Special Requests   Final    BOTTLES DRAWN AEROBIC AND ANAEROBIC Blood Culture adequate volume   Culture NO GROWTH 4 DAYS  Final   Report Status PENDING  Incomplete  Urine culture     Status: Abnormal   Collection Time: 08/04/17  5:07 PM  Result Value Ref Range Status   Specimen Description URINE, CLEAN CATCH  Final   Special Requests NONE  Final   Culture >=100,000 COLONIES/mL YEAST (A)  Final   Report Status 08/05/2017 FINAL  Final  MRSA PCR Screening  Status: None   Collection Time: 08/04/17  6:56 PM  Result Value Ref Range Status   MRSA by PCR NEGATIVE NEGATIVE Final    Comment:        The GeneXpert MRSA Assay (FDA approved for NASAL specimens only), is one component of a comprehensive MRSA colonization surveillance program. It is not intended to diagnose MRSA infection nor to guide or monitor treatment for MRSA infections.   Aerobic/Anaerobic Culture (surgical/deep wound)     Status: None (Preliminary result)   Collection Time: 08/05/17  4:28 PM  Result Value Ref Range Status   Specimen Description ABSCESS BACK  Final   Special Requests NONE  Final   Gram Stain   Final    ABUNDANT WBC PRESENT, PREDOMINANTLY PMN FEW GRAM POSITIVE COCCI IN CLUSTERS    Culture   Final    FEW STAPHYLOCOCCUS AUREUS NO ANAEROBES ISOLATED; CULTURE IN PROGRESS FOR 5 DAYS    Report Status PENDING  Incomplete   Organism ID, Bacteria STAPHYLOCOCCUS AUREUS  Final      Susceptibility   Staphylococcus aureus - MIC*    CIPROFLOXACIN <=0.5 SENSITIVE Sensitive     ERYTHROMYCIN <=0.25 SENSITIVE Sensitive     GENTAMICIN <=0.5 SENSITIVE Sensitive     OXACILLIN <=0.25 SENSITIVE Sensitive     TETRACYCLINE <=1 SENSITIVE Sensitive     VANCOMYCIN <=0.5 SENSITIVE Sensitive     TRIMETH/SULFA <=10 SENSITIVE Sensitive     CLINDAMYCIN <=0.25 SENSITIVE Sensitive     RIFAMPIN <=0.5 SENSITIVE Sensitive     Inducible  Clindamycin NEGATIVE Sensitive     * FEW STAPHYLOCOCCUS AUREUS         Radiology Studies: No results found.      Scheduled Meds: . feeding supplement (PRO-STAT SUGAR FREE 64)  30 mL Oral BID  . insulin aspart  0-9 Units Subcutaneous TID WC  . insulin glargine  14 Units Subcutaneous QHS  . lidocaine-EPINEPHrine  20 mL Intradermal Once  . lisinopril  5 mg Oral Daily  . metoprolol tartrate  12.5 mg Oral BID  . multivitamin with minerals  1 tablet Oral Daily  . simvastatin  40 mg Oral Daily  . sodium chloride flush  5 mL Intravenous Q8H   Continuous Infusions: .  ceFAZolin (ANCEF) IV Stopped (08/08/17 1222)     LOS: 4 days    Time spent: > 35 minutes  Penny Pia, MD [facility] Pager [phone]  If 7PM-7AM, please contact night-coverage [email or web address] Password [facility] 08/08/2017, 4:33 PM

## 2017-08-08 NOTE — Progress Notes (Addendum)
   Subjective/Chief Complaint: No complaints.    Objective: Vital signs in last 24 hours: Temp:  [98.2 F (36.8 C)-99.1 F (37.3 C)] 99.1 F (37.3 C) (09/30 0500) Pulse Rate:  [61-74] 61 (09/30 0500) Resp:  [16-18] 18 (09/30 0500) BP: (99-109)/(58-73) 102/59 (09/30 0500) SpO2:  [100 %] 100 % (09/30 0500) Last BM Date: 08/07/17  Intake/Output from previous day: 09/29 0701 - 09/30 0700 In: 1030 [P.O.:350; I.V.:5; IV Piggyback:500] Out: 1345 [Urine:1300; Drains:45] Intake/Output this shift: No intake/output data recorded.  General appearance: alert and cooperative Resp: clear to auscultation bilaterally Cardio: regular rate and rhythm GI: soft, non-tender; bowel sounds normal; no masses,  no organomegaly  Lab Results:  No results for input(s): WBC, HGB, HCT, PLT in the last 72 hours. BMET  Recent Labs  08/07/17 0326 08/08/17 0346  NA 134* 135  K 3.1* 3.4*  CL 104 102  CO2 27 28  GLUCOSE 137* 121*  BUN 5* 7  CREATININE 0.56* 0.62  CALCIUM 7.4* 7.6*   PT/INR No results for input(s): LABPROT, INR in the last 72 hours. ABG No results for input(s): PHART, HCO3 in the last 72 hours.  Invalid input(s): PCO2, PO2  Studies/Results: No results found.  Anti-infectives: Anti-infectives    Start     Dose/Rate Route Frequency Ordered Stop   08/06/17 1700  ceFAZolin (ANCEF) IVPB 2g/100 mL premix     2 g 200 mL/hr over 30 Minutes Intravenous Every 8 hours 08/06/17 1547     08/05/17 0200  vancomycin (VANCOCIN) IVPB 750 mg/150 ml premix  Status:  Discontinued     750 mg 150 mL/hr over 60 Minutes Intravenous Every 8 hours 08/04/17 1549 08/06/17 1530   08/04/17 2200  piperacillin-tazobactam (ZOSYN) IVPB 3.375 g  Status:  Discontinued     3.375 g 12.5 mL/hr over 240 Minutes Intravenous Every 8 hours 08/04/17 1549 08/06/17 1530   08/04/17 1445  vancomycin (VANCOCIN) 1,250 mg in sodium chloride 0.9 % 250 mL IVPB     1,250 mg 166.7 mL/hr over 90 Minutes Intravenous  Once  08/04/17 1434 08/04/17 1835   08/04/17 1445  piperacillin-tazobactam (ZOSYN) IVPB 3.375 g     3.375 g 100 mL/hr over 30 Minutes Intravenous  Once 08/04/17 1434 08/04/17 1627      Assessment/Plan: s/p * No surgery found * Advance diet  Continue drain and ancef per ID No need for surgery at this point for subcutaneous abscess  LOS: 4 days    TOTH III,Americus Scheurich S 08/08/2017

## 2017-08-08 NOTE — Clinical Social Work Note (Signed)
Clinical Social Work Assessment  Patient Details  Name: Cameron Coffey MRN: [id number] Date of Birth: 1961/07/17  Date of referral:  08/08/17               Reason for consult:  Facility Placement                Permission sought to share information with:  Facility Medical sales representative, Family Supports Permission granted to share information::  Yes, Verbal Permission Granted  Name::     Burlene Arnt   [phone] or Jacobo Forest Significant other   [phone] or Carolan Shiver   [phone]   Agency::  SNF admissions  Relationship::     Contact Information:     Housing/Transportation Living arrangements for the past 2 months:  Skilled Nursing Facility, Single Family Home Source of Information:  Patient, Medical Team Patient Interpreter Needed:  None Criminal Activity/Legal Involvement Pertinent to Current Situation/Hospitalization:  No - Comment as needed Significant Relationships:  Friend, Other Family Members, Siblings Lives with:  Facility Resident Do you feel safe going back to the place where you live?  No Need for family participation in patient care:  No (Coment)  Care giving concerns:  Patient expressed he does not want to return back to SNF.  Patient expressed he does not like the way he is being treated.   Social Worker assessment / plan:  Patient is a 56 year old male who is alert and oriented x4.  Patient has been at [facility] for IV antibiotics, due to patient not having any insurance his options were limited on where he can go.  CSW spoke with patient and he expressed that he is not happy with the care that is being provided at [facility].  CSW asked patient if he has spoken to the administration and he said he has not.  CSW discussed with patient that he is limited on the options of what he can do.  CSW informed him that the physician is recommending IV antibiotics to continue, CSW informed him that because of the physion's recommendation it would be in his  best interest to return back to SNF.  CSW told patient the other options are he can speak with the case manager about seeing if patient can go home with home health IV antibiotics, however it may be difficult to find a home health agency who can assist.  Patient asked if there would be anyway he could be switched to a pill form of the antibiotic, and CSW informed him he would have to speak with the physician about it.  Patient asked if there is anyway he can use someone else's insurance like his ex wife or his girlfriend, CSW informed him that he would have to speak with each of them to see if they can help him out.  The other option CSW gave patient is he can contact DSS to see if he can apply for Medicaid, CSW provided patient phone number for [facility], and the health insurance exchange phone number.    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  Patient said that was about 6 months ago, patient stated he should have gone to the doctor when it grew, but he decided not to because he didn't think he  need to.  Patient expressed now looking back he wishes he would have gone to see doctor then, and it may have helped prevent him from getting in the current situation.  Patient expressed that is another reason why he ended up moving back home with his parents.  Patient stated he was sad about the situation but expressed he will get through it.  Patient states he would rather go home instead of going back to [facility].  CSW reminded patient that he is covered for two more weeks at SNF due to the LOG that was approved.  Patient still feels like he does not want to go back, however he states his parents are telling him that he should just continue to go since it is in the best interest for him.  Patient would like to consider  seeing if he can go home with home IV antibiotics.  Employment status:  Unemployed Health and safety inspector:  Self Pay (Medicaid Pending) PT Recommendations:  Home with Home Health Information / Referral to community resources:  Other (Comment Required) (DSS to apply for Medicaid, and health insurance market phone number.)  Patient/Family's Response to care:  Patient expressed that he does not want to go back to SNF.  CSW reminded him that per MD recommendation he should return, patient still does not feel like he wants to return.  Patient/Family's Understanding of and Emotional Response to Diagnosis, Current Treatment, and Prognosis:  Patient expressed that he is upset that he needs to continue to receive IV antibiotics at SNF.  Patient is aware that if he wants to get well, he will have to continue with the treatment plan.  He would rather go home with IV antibiotics or see if there is a pill form that he can take.  Emotional Assessment Appearance:  Appears stated age Attitude/Demeanor/Rapport:  Grieving Affect (typically observed):  Appropriate, Calm, Sad, Accepting Orientation:  Oriented to Self, Oriented to Place, Oriented to  Time, Oriented to Situation Alcohol / Substance use:  Not Applicable Psych involvement (Current and /or in the community):  No (Comment)  Discharge Needs  Concerns to be addressed:  Care Coordination, Lack of Support, Financial / Insurance Concerns Readmission within the last 30 days:  Yes (07-29-17 to [facility] SNF.) Current discharge risk:  Lack of support system, Inadequate Financial Supports Barriers to Discharge:  Inadequate or no insurance   Darleene Cleaver, LCSWA 08/08/2017, 5:25 PM

## 2017-08-08 NOTE — [place] FL2 (Signed)
[place] MEDICAID FL2 LEVEL OF CARE SCREENING TOOL     IDENTIFICATION  Patient Name: Cameron Coffey Birthdate: 12-Dec-1960 Sex: male Admission Date (Current Location): 08/04/2017  [facility] and [place] Number:  Engineer, civil (consulting) and Address:  The [place], [address] [place], [place] [address]      Provider Number: [id number]  Attending Physician Name and Address:  Penny Pia, MD  Relative Name and Phone Number:  Burlene Arnt [phone] or Robin Searing   [phone]     Current Level of Care: Hospital Recommended Level of Care: Skilled Nursing Facility Prior Approval Number:    Date Approved/Denied:   PASRR Number: [id number]  Discharge Plan: SNF    Current Diagnoses: Patient Active Problem List   Diagnosis Date Noted  . Pressure injury of skin 08/05/2017  . Pain in scapula 08/04/2017  . Abscess of scapular region 08/04/2017  . Dyslipidemia associated with type 2 diabetes mellitus (HCC) 07/30/2017  . Type II diabetes with long term use of insulin (HCC) 07/30/2017  . MSSA (methicillin susceptible Staphylococcus aureus) septicemia (HCC) 07/30/2017  . Mass on back 07/30/2017  . Acute bilateral back pain 07/30/2017  . Constipation, chronic 07/30/2017  . Bilateral nephrolithiasis 07/30/2017  . Sepsis (HCC) 07/24/2017  . Essential hypertension 05/28/2015    Orientation RESPIRATION BLADDER Height & Weight     Self, Time, Situation, Place  Normal Continent Weight: 168 lb (76.2 kg) Height:   (175.3 cm)  BEHAVIORAL SYMPTOMS/MOOD NEUROLOGICAL BOWEL NUTRITION STATUS      Continent Diet (Carb Modified)  AMBULATORY STATUS COMMUNICATION OF NEEDS Skin   Supervision Verbally Surgical wounds, PU Stage and Appropriate Care   PU Stage 2 Dressing:  (PRN dressing change)                   Personal Care Assistance Level of Assistance  Bathing, Feeding, Dressing Bathing Assistance: Limited assistance Feeding  assistance: Independent Dressing Assistance: Limited assistance     Functional Limitations Info  Sight, Hearing, Speech Sight Info: Adequate Hearing Info: Adequate Speech Info: Adequate    SPECIAL CARE FACTORS FREQUENCY  PT (By licensed PT)     PT Frequency: Minimum 2x a week home health              Contractures Contractures Info: Not present    Additional Factors Info  Code Status, Allergies, Insulin Sliding Scale Code Status Info: Full Code Allergies Info: NKA   Insulin Sliding Scale Info: insulin aspart (novoLOG) injection 0-9 Units 3x a day with meals       Current Medications (08/08/2017):  This is the current hospital active medication list Current Facility-Administered Medications  Medication Dose Route Frequency Provider Last Rate Last Dose  . acetaminophen (TYLENOL) tablet 650 mg  650 mg Oral Q6H PRN Marcos Eke, PA-C   650 mg at 08/08/17 1327   Or  . acetaminophen (TYLENOL) suppository 650 mg  650 mg Rectal Q6H PRN Marcos Eke, PA-C      . bisacodyl (DULCOLAX) suppository 10 mg  10 mg Rectal Daily PRN Marlowe Kays E, PA-C   10 mg at 08/05/17 0050  . ceFAZolin (ANCEF) IVPB 2g/100 mL premix  2 g Intravenous Q8H Dang, Thuy D, RPH 200 mL/hr at 08/08/17 1715 2 g at 08/08/17 1715  . feeding supplement (PRO-STAT SUGAR FREE 64) liquid 30 mL  30 mL Oral BID Penny Pia, MD   30 mL at 08/08/17 1107  . HYDROcodone-acetaminophen (NORCO/VICODIN) 5-325 MG per  tablet 1-2 tablet  1-2 tablet Oral Q4H PRN Marcos Eke, PA-C   1 tablet at 08/07/17 1334  . insulin aspart (novoLOG) injection 0-9 Units  0-9 Units Subcutaneous TID WC Marcos Eke, PA-C   2 Units at 08/08/17 1224  . insulin glargine (LANTUS) injection 14 Units  14 Units Subcutaneous QHS Marcos Eke, [place]   14 Units at 08/07/17 2222  . lidocaine-EPINEPHrine (XYLOCAINE W/EPI) 2 %-1:200000 (PF) injection 20 mL  20 mL Intradermal Once Adela Lank, Dan, DO      . lisinopril (PRINIVIL,ZESTRIL) tablet 5 mg  5  mg Oral Daily Marcos Eke, PA-C   5 mg at 08/08/17 1110  . metoprolol tartrate (LOPRESSOR) tablet 12.5 mg  12.5 mg Oral BID Marcos Eke, PA-C   12.5 mg at 08/08/17 1109  . multivitamin with minerals tablet 1 tablet  1 tablet Oral Daily Penny Pia, MD   1 tablet at 08/08/17 1105  . ondansetron (ZOFRAN) tablet 4 mg  4 mg Oral Q6H PRN Marcos Eke, PA-C       Or  . ondansetron [facility]) injection 4 mg  4 mg Intravenous Q6H PRN Marcos Eke, PA-C      . senna-docusate (Senokot-S) tablet 1 tablet  1 tablet Oral QHS PRN Marcos Eke, PA-C      . simvastatin (ZOCOR) tablet 40 mg  40 mg Oral Daily Marcos Eke, PA-C   40 mg at 08/08/17 1107  . sodium chloride flush (NS) 0.9 % injection 10-40 mL  10-40 mL Intracatheter PRN Ozella Rocks, MD   10 mL at 08/05/17 0511  . sodium chloride flush (NS) 0.9 % injection 5 mL  5 mL Intravenous Q8H Gilmer Mor, DO   5 mL at 08/08/17 0102     Discharge Medications: Please see discharge summary for a list of discharge medications.  Relevant Imaging Results:  Relevant Lab Results:   Additional Information SSN [id number]  Patient will need IV antibiotics.  Donterius Filley, Ervin Knack, LCSWA

## 2017-08-08 NOTE — Progress Notes (Signed)
Occupational Therapy Treatment Patient Details Name: Cameron Coffey MRN: [id number] DOB: 1960/11/14 Today's Date: 08/08/2017    History of present illness Pt is a 56 y/o M who presented with fever and chills and a burning sensation when urinating.  Pt was found to have a UTI and an elevated lactic acid level.  Blood sugar was also elevated and pt was dehydrated.  Code sepsis was called.  Pt with prostate abscess, pyelonephritis and pt is going for cystoscopy by urology and possible draining of abscess. noted that pt has R middle lobe nodule with possible metastasis, pending CT of the chest following urology procedure.  pt s/p drainage of fluid under L scapula 9/27.   OT comments  Pt. Progressing well with acute OT goals.  Able to complete bed mobility, and amb. To/from b.room for toileting needs and standing grooming task at sink.    Follow Up Recommendations  Home health OT    Equipment Recommendations  3 in 1 bedside commode    Recommendations for Other Services      Precautions / Restrictions Precautions Precautions: Fall       Mobility Bed Mobility Overal bed mobility: Modified Independent Bed Mobility: Supine to Sit     Supine to sit: Modified independent (Device/Increase time)     General bed mobility comments: no bed rail, hob flat, exited on R side of bed  Transfers Overall transfer level: Needs assistance Equipment used: None Transfers: Sit to/from [employer] Sit to Stand: Min guard Stand pivot transfers: Min guard            Balance                                           ADL either performed or assessed with clinical judgement   ADL Overall ADL's : Needs assistance/impaired     Grooming: Wash/dry hands;Standing;Supervision/safety               Lower Body Dressing: Set up;Sitting/lateral leans   Toilet Transfer: Min guard;Ambulation Toilet Transfer Details (indicate cue type and reason): pt. stood to  urinate Toileting- Architect and Hygiene: Min guard;Minimal assistance;Sit to/from stand       Functional mobility during ADLs: Min guard General ADL Comments: no equipment used during amb. to/from b.room as pt. states he would not be using RW at home     Vision       Perception     Praxis      Cognition Arousal/Alertness: Awake/alert Behavior During Therapy: WFL for tasks assessed/performed Overall Cognitive Status: Within Functional Limits for tasks assessed                                          Exercises     Shoulder Instructions       General Comments      Pertinent Vitals/ Pain       Pain Assessment: No/denies pain  Home Living                                          Prior Functioning/Environment              Frequency  Min 2X/week  Progress Toward Goals  OT Goals(current goals can now be found in the care plan section)  Progress towards OT goals: Progressing toward goals     Plan Discharge plan remains appropriate    Co-evaluation                 AM-PAC PT "6 Clicks" Daily Activity     Outcome Measure   Help from another person eating meals?: None Help from another person taking care of personal grooming?: None Help from another person toileting, which includes using toliet, bedpan, or urinal?: A Little Help from another person bathing (including washing, rinsing, drying)?: A Little Help from another person to put on and taking off regular upper body clothing?: A Little Help from another person to put on and taking off regular lower body clothing?: A Little 6 Click Score: 20    End of Session    OT Visit Diagnosis: Unsteadiness on feet (R26.81);Muscle weakness (generalized) (M62.81);Pain   Activity Tolerance Patient tolerated treatment well   Patient Left in bed;with call bell/phone within reach   Nurse Communication          Time: 1020-1029 OT Time Calculation  (min): 9 min  Charges: OT General Charges $OT Visit: 1 Visit OT Treatments $Self Care/Home Management : 8-22 mins   Robet Leu, COTA/L 08/08/2017, 10:35 AM

## 2017-08-09 DIAGNOSIS — N412 Abscess of prostate: Secondary | ICD-10-CM

## 2017-08-09 DIAGNOSIS — E1169 Type 2 diabetes mellitus with other specified complication: Secondary | ICD-10-CM

## 2017-08-09 DIAGNOSIS — A4101 Sepsis due to Methicillin susceptible Staphylococcus aureus: Secondary | ICD-10-CM

## 2017-08-09 DIAGNOSIS — E785 Hyperlipidemia, unspecified: Secondary | ICD-10-CM

## 2017-08-09 LAB — BASIC METABOLIC PANEL
ANION GAP: 3 — AB (ref 5–15)
BUN: 6 mg/dL (ref 6–20)
CALCIUM: 7.3 mg/dL — AB (ref 8.9–10.3)
CO2: 28 mmol/L (ref 22–32)
CREATININE: 0.6 mg/dL — AB (ref 0.61–1.24)
Chloride: 103 mmol/L (ref 101–111)
GLUCOSE: 171 mg/dL — AB (ref 65–99)
Potassium: 3.1 mmol/L — ABNORMAL LOW (ref 3.5–5.1)
Sodium: 134 mmol/L — ABNORMAL LOW (ref 135–145)

## 2017-08-09 LAB — CULTURE, BLOOD (ROUTINE X 2)
Culture: NO GROWTH
Culture: NO GROWTH
Special Requests: ADEQUATE
Special Requests: ADEQUATE

## 2017-08-09 LAB — GLUCOSE, CAPILLARY
Glucose-Capillary: 111 mg/dL — ABNORMAL HIGH (ref 65–99)
Glucose-Capillary: 129 mg/dL — ABNORMAL HIGH (ref 65–99)
Glucose-Capillary: 190 mg/dL — ABNORMAL HIGH (ref 65–99)
Glucose-Capillary: 197 mg/dL — ABNORMAL HIGH (ref 65–99)

## 2017-08-09 MED ORDER — POTASSIUM CHLORIDE CRYS ER 20 MEQ PO TBCR
40.0000 meq | EXTENDED_RELEASE_TABLET | Freq: Once | ORAL | Status: AC
  Administered 2017-08-09: 40 meq via ORAL
  Filled 2017-08-09: qty 2

## 2017-08-09 NOTE — Progress Notes (Signed)
Referring Physician(s): Dr Jeanmarie Hubert  Supervising Physician: Marybelle Killings  Patient Status:  [facility] - In-pt  Chief Complaint:  Subscapular abscess  Subjective:  Procedure: 9/27: US guided drainage of left posterior back/shoulder abscess.  ~50cc purulent, blood fluid aspirated.   Pt feeling better 45 cc OP yesterday (pt has been emptying drain bag on his own)   Allergies: Patient has no known allergies.  Medications: Prior to Admission medications   Medication Sig Start Date End Date Taking? Authorizing Provider  acetaminophen (TYLENOL) 325 MG tablet Take 325 mg by mouth every 6 (six) hours as needed for mild pain.   Yes [provider]  glipiZIDE (GLUCOTROL) 10 MG tablet Take 1 tablet (10 mg total) by mouth 2 (two) times daily before a meal. 07/15/17  Yes Merlyn Lot, MD  lisinopril (PRINIVIL,ZESTRIL) 5 MG tablet Take 1 tablet (5 mg total) by mouth daily. 01/27/17  Yes Jearld Fenton, NP  metFORMIN (GLUCOPHAGE) 1000 MG tablet Take 1 tablet (1,000 mg total) by mouth 2 (two) times daily. 07/15/17  Yes Merlyn Lot, MD  simvastatin (ZOCOR) 40 MG tablet Take 1 tablet (40 mg total) by mouth daily. 01/27/17  Yes Baity, Coralie Keens, NP  ceFAZolin (ANCEF) IVPB Inject 1 g into the vein every 8 (eight) hours. Indication:  MSSA bacteremia Last Day of Therapy: october 29th Labs - Once weekly:  CBC/D and BMP, Labs - Every other week:  ESR and CRP 07/29/17   Epifanio Lesches, MD  insulin aspart (NOVOLOG FLEXPEN) 100 UNIT/ML FlexPen Inject 5 units subcutaneously before meals and and bedtime for DM CBG > or = 150.  Notify MD for CBG < 70 or > 500    [provider]  insulin glargine (LANTUS) 100 UNIT/ML injection Inject 0.14 mLs (14 Units total) into the skin at bedtime. 07/29/17   Epifanio Lesches, MD  metoprolol tartrate (LOPRESSOR) 25 MG tablet Take 0.5 tablets (12.5 mg total) by mouth 2 (two) times daily. 07/29/17   Epifanio Lesches, MD  oxyCODONE  (ROXICODONE) 5 MG immediate release tablet Take 1 tablet (5 mg total) by mouth every 4 (four) hours as needed for severe pain. 08/02/17   Gerlene Fee, NP     Vital Signs: BP 110/76   Pulse (!) 59   Temp 98.2 F (36.8 C) (Oral)   Resp 16   Ht _0  (1.753 m)   Wt 168 lb (76.2 kg)   SpO2 100%   BMI 24.81 kg/m   Physical Exam  Constitutional: He is oriented to person, place, and time.  Musculoskeletal: Normal range of motion.  Neurological: He is alert and oriented to person, place, and time.  Skin: Skin is warm and dry.  Site is clean and dry NT no bleeding OP serous color 45 cc yesterday per chart + staph aureus  (pt has been emptying drain bag himself---not recording)   Nursing note and vitals reviewed.   Imaging: No results found.  Labs:  CBC:  Recent Labs  07/25/17 0308 07/27/17 1300 08/04/17 1145 08/04/17 1206 08/05/17 0459  WBC 15.4* 8.1 6.4  --  5.4  HGB 14.5 12.4* 10.4* 12.6* 9.7*  HCT 42.4 36.3* 29.7* 37.0* 28.5*  PLT 153 136* 103*  --  106*    COAGS:  Recent Labs  07/24/17 1223 07/26/17 0153 08/05/17 0459  INR 1.41  --  1.32  APTT 31 58*  --     BMP:  Recent Labs  08/06/17 0411 08/07/17 0326 08/08/17 0346 08/09/17 8413  NA 132* 134* 135 134*  K 3.5 3.1* 3.4* 3.1*  CL 101 104 102 103  CO2 _0 GLUCOSE 181* 137* 121* 171*  BUN <5* 5* 7 6  CALCIUM 7.6* 7.4* 7.6* 7.3*  CREATININE 0.68 0.56* 0.62 0.60*  GFRNONAA >60 >60 >60 >60  GFRAA >60 >60 >60 >60    LIVER FUNCTION TESTS:  Recent Labs  08/14/16 1411 07/15/17 1315 07/31/17 08/04/17 1145 08/05/17 0459  BILITOT 1.1 2.1*  --  1.9* 1.8*  AST 162* 179* 130* 92* 85*  ALT 130* 126* 60* 36 34  ALKPHOS 245* 137* 183* 106 105  PROT 8.1 8.5*  --  9.0* 8.0  ALBUMIN 3.0* 2.8*  --  1.5* 1.3*    Assessment and Plan:  subscapular abscess drain intact Pt encouraged to NOT empty drain on own---please let RN record OP Will follow  Electronically  Signed: Covey Baller A, PA-C 08/09/2017, 4:36 PM   I spent a total of 15 Minutes at the the patient's bedside AND on the patient's hospital floor or unit, greater than 50% of which was counseling/coordinating care for abscess drain

## 2017-08-09 NOTE — Progress Notes (Signed)
Subjective:  He became upset when we discussed protracted IV abx and he told me how terrible an experience he had had at SNF  Antibiotics:  Anti-infectives    Start     Dose/Rate Route Frequency Ordered Stop   08/06/17 1700  ceFAZolin (ANCEF) IVPB 2g/100 mL premix     2 g 200 mL/hr over 30 Minutes Intravenous Every 8 hours 08/06/17 1547     08/05/17 0200  vancomycin (VANCOCIN) IVPB 750 mg/150 ml premix  Status:  Discontinued     750 mg 150 mL/hr over 60 Minutes Intravenous Every 8 hours 08/04/17 1549 08/06/17 1530   08/04/17 2200  piperacillin-tazobactam (ZOSYN) IVPB 3.375 g  Status:  Discontinued     3.375 g 12.5 mL/hr over 240 Minutes Intravenous Every 8 hours 08/04/17 1549 08/06/17 1530   08/04/17 1445  vancomycin (VANCOCIN) 1,250 mg in sodium chloride 0.9 % 250 mL IVPB     1,250 mg 166.7 mL/hr over 90 Minutes Intravenous  Once 08/04/17 1434 08/04/17 1835   08/04/17 1445  piperacillin-tazobactam (ZOSYN) IVPB 3.375 g     3.375 g 100 mL/hr over 30 Minutes Intravenous  Once 08/04/17 1434 08/04/17 1627      Medications: Scheduled Meds: . docusate sodium  100 mg Oral BID  . feeding supplement (PRO-STAT SUGAR FREE 64)  30 mL Oral BID  . insulin aspart  0-9 Units Subcutaneous TID WC  . insulin glargine  14 Units Subcutaneous QHS  . lidocaine-EPINEPHrine  20 mL Intradermal Once  . lisinopril  5 mg Oral Daily  . metoprolol tartrate  12.5 mg Oral BID  . multivitamin with minerals  1 tablet Oral Daily  . simvastatin  40 mg Oral Daily  . sodium chloride flush  5 mL Intravenous Q8H   Continuous Infusions: .  ceFAZolin (ANCEF) IV Stopped (08/09/17 0403)   PRN Meds:.acetaminophen **OR** acetaminophen, bisacodyl, HYDROcodone-acetaminophen, ondansetron **OR** ondansetron (ZOFRAN) IV, senna-docusate, sodium chloride flush    Objective: Weight change:   Intake/Output Summary (Last 24 hours) at 08/09/17 1024 Last data filed at 08/09/17 0725  Gross per 24 hour  Intake               745 ml  Output             1050 ml  Net             -305 ml   Blood pressure 105/67, pulse (!) 57, temperature 99.1 F (37.3 C), temperature source Oral, resp. rate 18, height  (1.753 m), weight 168 lb (76.2 kg), SpO2 99 %. Temp:  [98.2 F (36.8 C)-99.1 F (37.3 C)] 99.1 F (37.3 C) (10/01 0455) Pulse Rate:  [57-65] 57 (10/01 0455) Resp:  [16-18] 18 (10/01 0455) BP: (105-109)/(67-70) 105/67 (10/01 0455) SpO2:  [99 %-100 %] 99 % (10/01 0455)  Physical Exam: General: Alert and awake, oriented x3, not in any acute distress. HEENT: anicteric sclera, , EOMI CVS regular rate, normal r,  no murmur rubs or gallops Chest:diminised BS left side but otherwiseclear to auscultation bilaterally, no wheezing, rales or rhonchi Abdomen: softnondistended, normal bowel sounds, Extremities: no  clubbing or edema noted bilaterally Skin: no rashes Neuro: nonfocal  CBC: CBC Latest Ref Rng & Units 08/05/2017 08/04/2017 08/04/2017  WBC 4.0 - 10.5 K/uL 5.4 - 6.4  Hemoglobin 13.0 - 17.0 g/dL 1.6(X) 12.6(L) 10.4(L)  Hematocrit 39.0 - 52.0 % 28.5(L) 37.0(L) 29.7(L)  Platelets 150 - 400 K/uL 106(L) - 103(L)  BMET  Recent Labs  08/08/17 0346 08/09/17 0454  NA 135 134*  K 3.4* 3.1*  CL 102 103  CO2 28 28  GLUCOSE 121* 171*  BUN 7 6  CREATININE 0.62 0.60*  CALCIUM 7.6* 7.3*     Liver Panel  No results for input(s): PROT, ALBUMIN, AST, ALT, ALKPHOS, BILITOT, BILIDIR, IBILI in the last 72 hours.     Sedimentation Rate No results for input(s): ESRSEDRATE in the last 72 hours. C-Reactive Protein No results for input(s): CRP in the last 72 hours.  Micro Results: Recent Results (from the past 720 hour(s))  Blood culture (routine x 2)     Status: Abnormal   Collection Time: 07/24/17  4:24 AM  Result Value Ref Range Status   Specimen Description BLOOD LEFT HAND  Final   Special Requests   Final    BOTTLES DRAWN AEROBIC AND ANAEROBIC Blood Culture adequate volume    Culture  Setup Time   Final    GRAM POSITIVE COCCI IN BOTH AEROBIC AND ANAEROBIC BOTTLES CRITICAL RESULT CALLED TO, READ BACK BY AND VERIFIED WITH: [facility] HALLAJI AT 1938 07/24/17.PMH    Culture (A)  Final    STAPHYLOCOCCUS AUREUS SUSCEPTIBILITIES PERFORMED ON PREVIOUS CULTURE WITHIN THE LAST 5 DAYS. Performed at [facility], [address], [place], [place] [address]    Report Status 07/27/2017 FINAL  Final  Blood culture (routine x 2)     Status: Abnormal   Collection Time: 07/24/17  4:24 AM  Result Value Ref Range Status   Specimen Description BLOOD RIGHT HAND  Final   Special Requests   Final    BOTTLES DRAWN AEROBIC AND ANAEROBIC Blood Culture adequate volume   Culture  Setup Time   Final    GRAM POSITIVE COCCI IN BOTH AEROBIC AND ANAEROBIC BOTTLES CRITICAL RESULT CALLED TO, READ BACK BY AND VERIFIED WITH: [facility] HALLAJI AT 1938 07/24/17.PMH    Culture STAPHYLOCOCCUS AUREUS (A)  Final   Report Status 07/27/2017 FINAL  Final   Organism ID, Bacteria STAPHYLOCOCCUS AUREUS  Final      Susceptibility   Staphylococcus aureus - MIC*    CIPROFLOXACIN <=0.5 SENSITIVE Sensitive     ERYTHROMYCIN <=0.25 SENSITIVE Sensitive     GENTAMICIN <=0.5 SENSITIVE Sensitive     OXACILLIN 0.5 SENSITIVE Sensitive     TETRACYCLINE <=1 SENSITIVE Sensitive     VANCOMYCIN <=0.5 SENSITIVE Sensitive     TRIMETH/SULFA <=10 SENSITIVE Sensitive     CLINDAMYCIN <=0.25 SENSITIVE Sensitive     RIFAMPIN <=0.5 SENSITIVE Sensitive     Inducible Clindamycin NEGATIVE Sensitive     * STAPHYLOCOCCUS AUREUS  Blood Culture ID Panel (Reflexed)     Status: Abnormal   Collection Time: 07/24/17  4:24 AM  Result Value Ref Range Status   Enterococcus species NOT DETECTED NOT DETECTED Final   Listeria monocytogenes NOT DETECTED NOT DETECTED Final   Staphylococcus species DETECTED (A) NOT DETECTED Final    Comment: CRITICAL RESULT CALLED TO, READ BACK BY AND VERIFIED WITH: SHEEMA HALLAJI AT 1938 07/24/17.PMH     Staphylococcus aureus DETECTED (A) NOT DETECTED Final    Comment: Methicillin (oxacillin) susceptible Staphylococcus aureus (MSSA). Preferred therapy is anti staphylococcal beta lactam antibiotic (Cefazolin or Nafcillin), unless clinically contraindicated. CRITICAL RESULT CALLED TO, READ BACK BY AND VERIFIED WITH: [facility] HALLAJI AT 1938 07/24/17.PMH    Methicillin resistance NOT DETECTED NOT DETECTED Final   Streptococcus species NOT DETECTED NOT DETECTED Final   Streptococcus agalactiae NOT DETECTED NOT  DETECTED Final   Streptococcus pneumoniae NOT DETECTED NOT DETECTED Final   Streptococcus pyogenes NOT DETECTED NOT DETECTED Final   Acinetobacter baumannii NOT DETECTED NOT DETECTED Final   Enterobacteriaceae species NOT DETECTED NOT DETECTED Final   Enterobacter cloacae complex NOT DETECTED NOT DETECTED Final   Escherichia coli NOT DETECTED NOT DETECTED Final   Klebsiella oxytoca NOT DETECTED NOT DETECTED Final   Klebsiella pneumoniae NOT DETECTED NOT DETECTED Final   Proteus species NOT DETECTED NOT DETECTED Final   Serratia marcescens NOT DETECTED NOT DETECTED Final   Haemophilus influenzae NOT DETECTED NOT DETECTED Final   Neisseria meningitidis NOT DETECTED NOT DETECTED Final   Pseudomonas aeruginosa NOT DETECTED NOT DETECTED Final   Candida albicans NOT DETECTED NOT DETECTED Final   Candida glabrata NOT DETECTED NOT DETECTED Final   Candida krusei NOT DETECTED NOT DETECTED Final   Candida parapsilosis NOT DETECTED NOT DETECTED Final   Candida tropicalis NOT DETECTED NOT DETECTED Final  Urine Culture     Status: Abnormal   Collection Time: 07/25/17  3:34 AM  Result Value Ref Range Status   Specimen Description URINE, RANDOM  Final   Special Requests NONE  Final   Culture >=100,000 COLONIES/mL STAPHYLOCOCCUS AUREUS (A)  Final   Report Status 07/27/2017 FINAL  Final   Organism ID, Bacteria STAPHYLOCOCCUS AUREUS (A)  Final      Susceptibility   Staphylococcus aureus - MIC*     CIPROFLOXACIN <=0.5 SENSITIVE Sensitive     GENTAMICIN <=0.5 SENSITIVE Sensitive     NITROFURANTOIN <=16 SENSITIVE Sensitive     OXACILLIN <=0.25 SENSITIVE Sensitive     TETRACYCLINE <=1 SENSITIVE Sensitive     VANCOMYCIN 1 SENSITIVE Sensitive     TRIMETH/SULFA <=10 SENSITIVE Sensitive     CLINDAMYCIN <=0.25 SENSITIVE Sensitive     RIFAMPIN <=0.5 SENSITIVE Sensitive     Inducible Clindamycin NEGATIVE Sensitive     * >=100,000 COLONIES/mL STAPHYLOCOCCUS AUREUS  Culture, blood (Routine X 2) w Reflex to ID Panel     Status: Abnormal   Collection Time: 07/25/17  5:17 PM  Result Value Ref Range Status   Specimen Description BLOOD RIGHT ANTECUBITAL  Final   Special Requests   Final    BOTTLES DRAWN AEROBIC AND ANAEROBIC Blood Culture adequate volume   Culture  Setup Time   Final    GRAM POSITIVE COCCI IN BOTH AEROBIC AND ANAEROBIC BOTTLES CRITICAL RESULT CALLED TO, READ BACK BY AND VERIFIED WITH: KAREN HAYES AT 1444 ON 07/26/2017 JJB    Culture (A)  Final    STAPHYLOCOCCUS AUREUS SUSCEPTIBILITIES PERFORMED ON PREVIOUS CULTURE WITHIN THE LAST 5 DAYS. Performed at [facility], [place] [address] [place], [place] [address]    Report Status 07/29/2017 FINAL  Final  Culture, blood (Routine X 2) w Reflex to ID Panel     Status: None   Collection Time: 07/25/17  5:25 PM  Result Value Ref Range Status   Specimen Description BLOOD LEFT ANTECUBITAL  Final   Special Requests   Final    BOTTLES DRAWN AEROBIC AND ANAEROBIC Blood Culture results may not be optimal due to an excessive volume of blood received in culture bottles   Culture NO GROWTH 5 DAYS  Final   Report Status 07/30/2017 FINAL  Final  Culture, blood (single) w Reflex to ID Panel     Status: None   Collection Time: 07/26/17  4:54 PM  Result Value Ref Range Status   Specimen Description BLOOD LEFT ANTECUBITAL  Final   Special Requests   Final    BOTTLES DRAWN AEROBIC AND ANAEROBIC Blood Culture adequate volume   Culture NO  GROWTH 5 DAYS  Final   Report Status 07/31/2017 FINAL  Final  Blood culture (routine x 2)     Status: None (Preliminary result)   Collection Time: 08/04/17 11:45 AM  Result Value Ref Range Status   Specimen Description BLOOD LEFT ANTECUBITAL  Final   Special Requests   Final    BOTTLES DRAWN AEROBIC AND ANAEROBIC Blood Culture adequate volume   Culture NO GROWTH 4 DAYS  Final   Report Status PENDING  Incomplete  Blood culture (routine x 2)     Status: None (Preliminary result)   Collection Time: 08/04/17 11:52 AM  Result Value Ref Range Status   Specimen Description BLOOD LEFT WRIST  Final   Special Requests   Final    BOTTLES DRAWN AEROBIC AND ANAEROBIC Blood Culture adequate volume   Culture NO GROWTH 4 DAYS  Final   Report Status PENDING  Incomplete  Urine culture     Status: Abnormal   Collection Time: 08/04/17  5:07 PM  Result Value Ref Range Status   Specimen Description URINE, CLEAN CATCH  Final   Special Requests NONE  Final   Culture >=100,000 COLONIES/mL YEAST (A)  Final   Report Status 08/05/2017 FINAL  Final  MRSA PCR Screening     Status: None   Collection Time: 08/04/17  6:56 PM  Result Value Ref Range Status   MRSA by PCR NEGATIVE NEGATIVE Final    Comment:        The GeneXpert MRSA Assay (FDA approved for NASAL specimens only), is one component of a comprehensive MRSA colonization surveillance program. It is not intended to diagnose MRSA infection nor to guide or monitor treatment for MRSA infections.   Aerobic/Anaerobic Culture (surgical/deep wound)     Status: None (Preliminary result)   Collection Time: 08/05/17  4:28 PM  Result Value Ref Range Status   Specimen Description ABSCESS BACK  Final   Special Requests NONE  Final   Gram Stain   Final    ABUNDANT WBC PRESENT, PREDOMINANTLY PMN FEW GRAM POSITIVE COCCI IN CLUSTERS    Culture   Final    FEW STAPHYLOCOCCUS AUREUS NO ANAEROBES ISOLATED; CULTURE IN PROGRESS FOR 5 DAYS    Report Status  PENDING  Incomplete   Organism ID, Bacteria STAPHYLOCOCCUS AUREUS  Final      Susceptibility   Staphylococcus aureus - MIC*    CIPROFLOXACIN <=0.5 SENSITIVE Sensitive     ERYTHROMYCIN <=0.25 SENSITIVE Sensitive     GENTAMICIN <=0.5 SENSITIVE Sensitive     OXACILLIN <=0.25 SENSITIVE Sensitive     TETRACYCLINE <=1 SENSITIVE Sensitive     VANCOMYCIN <=0.5 SENSITIVE Sensitive     TRIMETH/SULFA <=10 SENSITIVE Sensitive     CLINDAMYCIN <=0.25 SENSITIVE Sensitive     RIFAMPIN <=0.5 SENSITIVE Sensitive     Inducible Clindamycin NEGATIVE Sensitive     * FEW STAPHYLOCOCCUS AUREUS    Studies/Results: No results found.    Assessment/Plan:  INTERVAL HISTORY:  Pt is sp IR drainage, repeat imaging planned   Active Problems:   Essential hypertension   Dyslipidemia associated with type 2 diabetes mellitus (HCC)   Type II diabetes with long term use of insulin (HCC)   MSSA (methicillin susceptible Staphylococcus aureus) septicemia (HCC)   Mass on back   Acute bilateral back pain   Constipation, chronic   Bilateral nephrolithiasis  Pain in scapula   Abscess of scapular region   Pressure injury of skin    Cameron Coffey is a 56 y.o. 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 If he has any abdominal  complaints would re CT abdomen  #4 Disp: with re to DC planning he really doesn't want to go to SNF but if he were to go home SOMEONE would need to be involved with assuring IV abx given three times a day in reliable way. His parents are in their 58s (with whom he lives) but he has a sister who lives nearby. I told him (and I did put in consult for CM) I would communicate with CM. Not clear from that notes that it will be easy to honor his wishes but perhaps it can be done?  I spent greater than 35  minutes with the patient including greater than 50% of time in face to face counsel of the patient re his MSSA bacteremia, metastatic infection and chest wall abscess  and in coordination of his care.    LOS: 5 days   Acey Lav 08/09/2017, 10:24 AM

## 2017-08-09 NOTE — Progress Notes (Signed)
   Subjective/Chief Complaint: Feels fine no complaints   Objective: Vital signs in last 24 hours: Temp:  [98.2 F (36.8 C)-99.1 F (37.3 C)] 99.1 F (37.3 C) (10/01 0455) Pulse Rate:  [57-65] 57 (10/01 0455) Resp:  [16-18] 18 (10/01 0455) BP: (105-109)/(67-70) 105/67 (10/01 0455) SpO2:  [99 %-100 %] 99 % (10/01 0455) Last BM Date: 08/07/17  Intake/Output from previous day: 09/30 0701 - 10/01 0700 In: 745 [P.O.:440; I.V.:5; IV Piggyback:300] Out: 450 [Urine:450] Intake/Output this shift: Total I/O In: -  Out: 600 [Urine:600]  Drain with brownish fluid, not sure when changed last, site not tender, no fluctuance  Lab Results:  No results for input(s): WBC, HGB, HCT, PLT in the last 72 hours. BMET  Recent Labs  08/08/17 0346 08/09/17 0454  NA 135 134*  K 3.4* 3.1*  CL 102 103  CO2 28 28  GLUCOSE 121* 171*  BUN 7 6  CREATININE 0.62 0.60*  CALCIUM 7.6* 7.3*   PT/INR No results for input(s): LABPROT, INR in the last 72 hours. ABG No results for input(s): PHART, HCO3 in the last 72 hours.  Invalid input(s): PCO2, PO2  Studies/Results: No results found.  Anti-infectives: Anti-infectives    Start     Dose/Rate Route Frequency Ordered Stop   08/06/17 1700  ceFAZolin (ANCEF) IVPB 2g/100 mL premix     2 g 200 mL/hr over 30 Minutes Intravenous Every 8 hours 08/06/17 1547     08/05/17 0200  vancomycin (VANCOCIN) IVPB 750 mg/150 ml premix  Status:  Discontinued     750 mg 150 mL/hr over 60 Minutes Intravenous Every 8 hours 08/04/17 1549 08/06/17 1530   08/04/17 2200  piperacillin-tazobactam (ZOSYN) IVPB 3.375 g  Status:  Discontinued     3.375 g 12.5 mL/hr over 240 Minutes Intravenous Every 8 hours 08/04/17 1549 08/06/17 1530   08/04/17 1445  vancomycin (VANCOCIN) 1,250 mg in sodium chloride 0.9 % 250 mL IVPB     1,250 mg 166.7 mL/hr over 90 Minutes Intravenous  Once 08/04/17 1434 08/04/17 1835   08/04/17 1445  piperacillin-tazobactam (ZOSYN) IVPB 3.375 g     3.375 g 100 mL/hr over 30 Minutes Intravenous  Once 08/04/17 1434 08/04/17 1627      Assessment/Plan: Scapular abscess mssa bacteremia  This appears to be improving with IR drainage, not sure of what drain output is as not recorded Continue abx per ID Will follow and IR likely to plan reimaging No indication for srugery now  [facility] 08/09/2017

## 2017-08-09 NOTE — Progress Notes (Deleted)
08/11/2017 8:50 PM   Cameron Coffey Nov 16, 1960 [id number]  Referring provider: Lorre Munroe, NP [address] [place], [place] [address]  No chief complaint on file.   HPI: Patient is a 56 year old African American male with a history of a prostate abscess, s/p TURP with uproofing with Dr. Annabell Howells on 07/27/2017  Background history 56 yo male  [name] admitted with fever and chills and dysuria. His UA showed too numerous to count white cells, 6-30 red cells and no bacteria. I did not see the urine culture was initially sent, however his blood cultures have grown staph aureus and he is being treated for that. His white count today down to 15 and creatinine 0.61. He underwent a pelvic ultrasound which revealed a 69 g prostate, post void residual of 38 mL and a thickened bladder wall particularly along the trigone. The trigonal thickness measured 5.9 x 5.1 cm and it was thought to possibly even represent a bladder mass. I reviewed all the images.  The patient denies any prior urologic history. Prior to this episode he voided with a good stream and no frequency or urgency. He said no gross hematuria. He does note about 2 months of progressive weight loss sugars up to "900". He was a [employer]. NG risk includes DM.  Contrast CT noted a prostate abscess.  Patient underwent a TURP with un roofing of prostate abscess with Dr. Annabell Howells on 07/27/2017.  He post operative course was as expected and uneventful.  Prostate chips ***  BPH WITH LUTS  (prostate and/or bladder) His IPSS score today is ***, which is *** lower urinary tract symptomatology. He is *** with his quality life due to his urinary symptoms. His PVR is *** mL.  His previous IPSS score was ***.  His previous PVR is *** mL.    His major complaint today ***.  He has had these symptoms for *** years.  He denies any dysuria, hematuria or suprapubic pain.   He currently taking ***.  His has had ***.  Previous  PSA's:     He also denies any recent fevers, chills, nausea or vomiting.  He has a family history of PCa, with ***.   He does not have a family history of PCa.***    Score:  1-7 Mild 8-19 Moderate 20-35 Severe    PMH: Past Medical History:  Diagnosis Date  . Heart murmur    "born w/one"  . Type II diabetes mellitus (HCC)     Surgical History: Past Surgical History:  Procedure Laterality Date  . TONSILLECTOMY  1982  . TRANSURETHRAL RESECTION OF PROSTATE N/A 07/26/2017   Procedure: TRANSURETHRAL RESECTION OF THE PROSTATE (TURP) WITH UPROOFING OF PROSTATE ABSCESS;  Surgeon: Bjorn Pippin, MD;  Location: [facility] ORS;  Service: Urology;  Laterality: N/A;    Home Medications:  Allergies as of 08/11/2017   No Known Allergies     Medication List    Notice   This visit is during an admission. Changes to the med list made in this visit will be reflected in the After Visit Summary of the admission.     Allergies: No Known Allergies  Family History: Family History  Problem Relation Age of Onset  . Hyperlipidemia Mother   . Diabetes Mother   . Hyperlipidemia Father   . Diabetes Father     Social History:  reports that he has been smoking Cigarettes.  He has a 16.00 pack-year smoking history. He has  never used smokeless tobacco. He reports that he drinks about 12.6 oz of alcohol per week . He reports that he uses drugs, including Cocaine.  ROS:                                        Physical Exam: There were no vitals taken for this visit.  Constitutional: Well nourished. Alert and oriented, No acute distress. HEENT: [place] AT, moist mucus membranes. Trachea midline, no masses. Cardiovascular: No clubbing, cyanosis, or edema. Respiratory: Normal respiratory effort, no increased work of breathing. GI: Abdomen is soft, non tender, non distended, no abdominal masses. Liver and spleen not palpable.  No hernias appreciated.  Stool sample for occult  testing is not indicated.   GU: No CVA tenderness.  No bladder fullness or masses.  Patient with circumcised/uncircumcised phallus. ***Foreskin easily retracted***  Urethral meatus is patent.  No penile discharge. No penile lesions or rashes. Scrotum without lesions, cysts, rashes and/or edema.  Testicles are located scrotally bilaterally. No masses are appreciated in the testicles. Left and right epididymis are normal. Rectal: Patient with  normal sphincter tone. Anus and perineum without scarring or rashes. No rectal masses are appreciated. Prostate is approximately *** grams, *** nodules are appreciated. Seminal vesicles are normal. Skin: No rashes, bruises or suspicious lesions. Lymph: No cervical or inguinal adenopathy. Neurologic: Grossly intact, no focal deficits, moving all 4 extremities. Psychiatric: Normal mood and affect.  Laboratory Data: Lab Results  Component Value Date   WBC 5.4 08/05/2017   HGB 9.7 (L) 08/05/2017   HCT 28.5 (L) 08/05/2017   MCV 88.2 08/05/2017   PLT 106 (L) 08/05/2017    Lab Results  Component Value Date   CREATININE 0.60 (L) 08/09/2017    Lab Results  Component Value Date   HGBA1C 12.8 (H) 08/04/2017       Component Value Date/Time   CHOL 72 07/25/2017 0308   HDL <10 (L) 07/25/2017 0308   CHOLHDL NOT CALCULATED 07/25/2017 0308   VLDL 16 07/25/2017 0308   LDLCALC 102 (H) 08/14/2016 1411    Lab Results  Component Value Date   AST 85 (H) 08/05/2017   Lab Results  Component Value Date   ALT 34 08/05/2017    Urinalysis ***  I have reviewed the labs.   Pertinent Imaging: *** I have independently reviewed the films.    Assessment & Plan:  ***  1. Prostate abscess  - s/p TURP with un roofing of prostate abscess  No Follow-up on file.  These notes generated with voice recognition software. I apologize for typographical errors.  Michiel Cowboy, PA-C  [facility] [address], [address] [place], [place] [address] [phone]

## 2017-08-09 NOTE — Progress Notes (Signed)
PROGRESS NOTE    Cameron Coffey  MRN:[id number] DOB: 06/03/1961 DOA: 08/04/2017 PCP: Baity, Regina W, NP    Brief Narrative:  Cameron Coffey is a 56 y.o. male who presents with subscapular abscess that will likely need IR for drainage. IR consult placed. Continue current ABX regimen. Additionally Urology to see pt due to persistent pain from recent TURP.  Assessment & Plan:     Abscess of scapular region - IR was consulted and patient is status post ultrasound guided drainage of abscess. - Will follow-up with culture results for now antibiotics per ID recommendations. Per ID: Continue ancef Check TEE Cr stable BCx ngtd (9-26)  Discussed with cardiology for TEE. This will be done Wednesday per my discussion with them.   Prostatic abscess - consulted Urology and recommended the following: Irrigated Foley catheter at this time. if he begins to have voiding complaints or mild retention of urine, he can be started on Flomax 0.4 mg daily. No Urological intervention recommended. Dr. Fitzgerald who recommended IV Ancef every 8 hours until October 29  Active Problems:   Essential hypertension - Relatively well controlled on lisinopril and metoprolol    Dyslipidemia associated with type 2 diabetes mellitus (HCC) -  Pt is on Statin, no chest pain reported. stable    Type II diabetes with long term use of insulin (HCC) - Pt is on carb modified diet and lantus as well as SSI    MSSA (methicillin susceptible Staphylococcus aureus) septicemia (HCC) - Pt on vancomycin    Acute bilateral back pain - secondary to abscess - continue supportive therapy. Pt is s/p procedure listed above and below    Constipation, chronic - stable on dulcolax    Bilateral nephrolithiasis  Hypokalemia - replace orally and reassess  DVT prophylaxis: SCD's Code Status: Full Family Communication: d/c mother and sister.  Disposition Plan: Pending recommendations from specialist   Consultants:    Radiology  General surgery  Urology  Infectious disease   Procedures: US guided drainage of left posterior back/shoulder abscess  Antimicrobials: Zosyn, Vancomycin  Subjective: No acute issues reported overnight.   Objective: Vitals:   08/08/17 2138 08/09/17 0455 08/09/17 1121 08/09/17 1416  BP: 105/70 105/67 118/75 110/76  Pulse: 65 (!) 57 63 (!) 59  Resp: 18 18 18 16  Temp: 98.2 F (36.8 C) 99.1 F (37.3 C) 98.8 F (37.1 C) 98.2 F (36.8 C)  TempSrc: Oral Oral Oral Oral  SpO2: 100% 99% 100% 100%  Weight:      Height:        Intake/Output Summary (Last 24 hours) at 08/09/17 1418 Last data filed at 08/09/17 1400  Gross per 24 hour  Intake              985 ml  Output             1250 ml  Net             -265 ml   Filed Weights   08/04/17 0956 08/04/17 1757  Weight: 68 kg (150 lb) 76.2 kg (168 lb)    Examination:Exam unchanged when compared to 08/08/2017  General exam: Appears calm and comfortable, in nad.   Respiratory system: Clear to auscultation. Respiratory effort normal. Equal chest rise. Cardiovascular system: S1 & S2 heard, RRR. No JVD, murmurs, rubs, gallops or clicks Gastrointestinal system: Abdomen is nondistended, soft and nontender. No organomegaly or masses felt. Normal bowel sounds heard. Central nervous system: Alert and oriented. No focal neurological deficits.   Extremities: Symmetric 5 x 5 power. Skin: No rashes or ulcers, warm and dry, pt has drain in place with serosanguinous fluid in bag Psychiatry:  Mood & affect appropriate.     Data Reviewed: I have personally reviewed following labs and imaging studies  CBC:  Recent Labs Lab 08/04/17 1145 08/04/17 1206 08/05/17 0459  WBC 6.4  --  5.4  NEUTROABS 4.1  --   --   HGB 10.4* 12.6* 9.7*  HCT 29.7* 37.0* 28.5*  MCV 88.1  --  88.2  PLT 103*  --  106*   Basic Metabolic Panel:  Recent Labs Lab 08/05/17 0459 08/06/17 0411 08/07/17 0326 08/08/17 0346 08/09/17 0454  NA  132* 132* 134* 135 134*  K 3.4* 3.5 3.1* 3.4* 3.1*  CL 102 101 104 102 103  CO2 27 28 27 28 28  GLUCOSE 109* 181* 137* 121* 171*  BUN <5* <5* 5* 7 6  CREATININE 0.56* 0.68 0.56* 0.62 0.60*  CALCIUM 7.5* 7.6* 7.4* 7.6* 7.3*   GFR: Estimated Creatinine Clearance: 104.3 mL/min (A) (by C-G formula based on SCr of 0.6 mg/dL (L)). Liver Function Tests:  Recent Labs Lab 08/04/17 1145 08/05/17 0459  AST 92* 85*  ALT 36 34  ALKPHOS 106 105  BILITOT 1.9* 1.8*  PROT 9.0* 8.0  ALBUMIN 1.5* 1.3*   No results for input(s): LIPASE, AMYLASE in the last 168 hours. No results for input(s): AMMONIA in the last 168 hours. Coagulation Profile:  Recent Labs Lab 08/05/17 0459  INR 1.32   Cardiac Enzymes: No results for input(s): CKTOTAL, CKMB, CKMBINDEX, TROPONINI in the last 168 hours. BNP (last 3 results) No results for input(s): PROBNP in the last 8760 hours. HbA1C: No results for input(s): HGBA1C in the last 72 hours. CBG:  Recent Labs Lab 08/08/17 1150 08/08/17 1732 08/08/17 2136 08/09/17 0758 08/09/17 1206  GLUCAP 163* 163* 212* 111* 129*   Lipid Profile: No results for input(s): CHOL, HDL, LDLCALC, TRIG, CHOLHDL, LDLDIRECT in the last 72 hours. Thyroid Function Tests: No results for input(s): TSH, T4TOTAL, FREET4, T3FREE, THYROIDAB in the last 72 hours. Anemia Panel: No results for input(s): VITAMINB12, FOLATE, FERRITIN, TIBC, IRON, RETICCTPCT in the last 72 hours. Sepsis Labs:  Recent Labs Lab 08/04/17 1206 08/04/17 1616  LATICACIDVEN 2.47* 2.65*    Recent Results (from the past 240 hour(s))  Blood culture (routine x 2)     Status: None   Collection Time: 08/04/17 11:45 AM  Result Value Ref Range Status   Specimen Description BLOOD LEFT ANTECUBITAL  Final   Special Requests   Final    BOTTLES DRAWN AEROBIC AND ANAEROBIC Blood Culture adequate volume   Culture NO GROWTH 5 DAYS  Final   Report Status 08/09/2017 FINAL  Final  Blood culture (routine x 2)      Status: None   Collection Time: 08/04/17 11:52 AM  Result Value Ref Range Status   Specimen Description BLOOD LEFT WRIST  Final   Special Requests   Final    BOTTLES DRAWN AEROBIC AND ANAEROBIC Blood Culture adequate volume   Culture NO GROWTH 5 DAYS  Final   Report Status 08/09/2017 FINAL  Final  Urine culture     Status: Abnormal   Collection Time: 08/04/17  5:07 PM  Result Value Ref Range Status   Specimen Description URINE, CLEAN CATCH  Final   Special Requests NONE  Final   Culture >=100,000 COLONIES/mL YEAST (A)  Final   Report Status 08/05/2017 FINAL  Final    MRSA PCR Screening     Status: None   Collection Time: 08/04/17  6:56 PM  Result Value Ref Range Status   MRSA by PCR NEGATIVE NEGATIVE Final    Comment:        The GeneXpert MRSA Assay (FDA approved for NASAL specimens only), is one component of a comprehensive MRSA colonization surveillance program. It is not intended to diagnose MRSA infection nor to guide or monitor treatment for MRSA infections.   Aerobic/Anaerobic Culture (surgical/deep wound)     Status: None (Preliminary result)   Collection Time: 08/05/17  4:28 PM  Result Value Ref Range Status   Specimen Description ABSCESS BACK  Final   Special Requests NONE  Final   Gram Stain   Final    ABUNDANT WBC PRESENT, PREDOMINANTLY PMN FEW GRAM POSITIVE COCCI IN CLUSTERS    Culture   Final    FEW STAPHYLOCOCCUS AUREUS NO ANAEROBES ISOLATED; CULTURE IN PROGRESS FOR 5 DAYS    Report Status PENDING  Incomplete   Organism ID, Bacteria STAPHYLOCOCCUS AUREUS  Final      Susceptibility   Staphylococcus aureus - MIC*    CIPROFLOXACIN <=0.5 SENSITIVE Sensitive     ERYTHROMYCIN <=0.25 SENSITIVE Sensitive     GENTAMICIN <=0.5 SENSITIVE Sensitive     OXACILLIN <=0.25 SENSITIVE Sensitive     TETRACYCLINE <=1 SENSITIVE Sensitive     VANCOMYCIN <=0.5 SENSITIVE Sensitive     TRIMETH/SULFA <=10 SENSITIVE Sensitive     CLINDAMYCIN <=0.25 SENSITIVE Sensitive      RIFAMPIN <=0.5 SENSITIVE Sensitive     Inducible Clindamycin NEGATIVE Sensitive     * FEW STAPHYLOCOCCUS AUREUS         Radiology Studies: No results found.      Scheduled Meds: . docusate sodium  100 mg Oral BID  . feeding supplement (PRO-STAT SUGAR FREE 64)  30 mL Oral BID  . insulin aspart  0-9 Units Subcutaneous TID WC  . insulin glargine  14 Units Subcutaneous QHS  . lidocaine-EPINEPHrine  20 mL Intradermal Once  . lisinopril  5 mg Oral Daily  . metoprolol tartrate  12.5 mg Oral BID  . multivitamin with minerals  1 tablet Oral Daily  . simvastatin  40 mg Oral Daily  . sodium chloride flush  5 mL Intravenous Q8H   Continuous Infusions: .  ceFAZolin (ANCEF) IV Stopped (08/09/17 1410)     LOS: 5 days    Time spent: > 35 minutes  Jamerion Cabello, MD [facility] Pager [phone]  If 7PM-7AM, please contact night-coverage [email or web address] Password [email or web address] 08/09/2017, 2:18 PM   
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
## 2017-08-09 NOTE — Progress Notes (Signed)
    [facility] has been requested to perform a transesophageal echocardiogram on Cameron Coffey for bacteremia.  After careful review of history and examination, the risks and benefits of transesophageal echocardiogram have been explained including risks of esophageal damage, perforation (1:10,000 risk), bleeding, pharyngeal hematoma as well as other potential complications associated with conscious sedation including aspiration, arrhythmia, respiratory failure and death. Alternatives to treatment were discussed, questions were answered. I also called and spoke with the patient's father at the patient's request. Patient is willing to proceed.   The procedure is scheduled for tomorrow, 08/10/2017 at 10:00 with Dr. Jens Som.   Berton Bon, NP  08/09/2017 3:19 PM

## 2017-08-09 NOTE — Progress Notes (Signed)
Physical Therapy Treatment Patient Details Name: Cameron Coffey MRN: [id number] DOB: 1961/04/03 Today's Date: 08/09/2017    History of Present Illness Pt is a 56 y/o M who presented with fever and chills and a burning sensation when urinating.  Pt was found to have a UTI and an elevated lactic acid level.  Blood sugar was also elevated and pt was dehydrated.  Code sepsis was called.  Pt with prostate abscess, pyelonephritis and pt is going for cystoscopy by urology and possible draining of abscess. noted that pt has R middle lobe nodule with possible metastasis, pending CT of the chest following urology procedure.  pt s/p drainage of fluid under L scapula 9/27.    PT Comments    Progressing well.  Mostly deconditioned and will work back to his normal baseline well post d/c.   Follow Up Recommendations  Home health PT     Equipment Recommendations  Rolling walker with 5" wheels    Recommendations for Other Services       Precautions / Restrictions Precautions Precautions:  (minimal fall risk) Restrictions Weight Bearing Restrictions: No    Mobility  Bed Mobility Overal bed mobility: Modified Independent             General bed mobility comments: slow, but independent  Transfers Overall transfer level: Modified independent Equipment used: None Transfers: Sit to/from [employer] Sit to Stand: Modified independent (Device/Increase time) Stand pivot transfers: Modified independent (Device/Increase time)       General transfer comment: moving slow, but safe with no assist needed  Ambulation/Gait Ambulation/Gait assistance: Modified independent (Device/Increase time);Independent Ambulation Distance (Feet): 1000 Feet Assistive device: None Gait Pattern/deviations: Step-through pattern   Gait velocity interpretation: at or above normal speed for age/gender General Gait Details: steady, prefers slower cadence, but can speed up to cueing.   Stairs             Wheelchair Mobility    Modified Rankin (Stroke Patients Only)       Balance     Sitting balance-Leahy Scale: Good       Standing balance-Leahy Scale: Good                              Cognition Arousal/Alertness: Awake/alert Behavior During Therapy: WFL for tasks assessed/performed Overall Cognitive Status: Within Functional Limits for tasks assessed                                        Exercises      General Comments General comments (skin integrity, edema, etc.): Slow cadence, mild dyspnea with gait.      Pertinent Vitals/Pain Pain Assessment: Faces Faces Pain Scale: Hurts little more Pain Location: feet Pain Descriptors / Indicators: Sore Pain Intervention(s): Monitored during session    Home Living                      Prior Function            PT Goals (current goals can now be found in the care plan section) Acute Rehab PT Goals Patient Stated Goal: not stated PT Goal Formulation: With patient Time For Goal Achievement: 08/12/17 Potential to Achieve Goals: Fair Progress towards PT goals: Progressing toward goals    Frequency    Min 3X/week      PT Plan Current plan  remains appropriate    Co-evaluation              AM-PAC PT "6 Clicks" Daily Activity  Outcome Measure  Difficulty turning over in bed (including adjusting bedclothes, sheets and blankets)?: None Difficulty moving from lying on back to sitting on the side of the bed? : None Difficulty sitting down on and standing up from a chair with arms (e.g., wheelchair, bedside commode, etc,.)?: None Help needed moving to and from a bed to chair (including a wheelchair)?: None Help needed walking in hospital room?: A Little Help needed climbing 3-5 steps with a railing? : A Little 6 Click Score: 22    End of Session   Activity Tolerance: Patient tolerated treatment well Patient left: in chair;with call bell/phone within  reach Nurse Communication: Mobility status PT Visit Diagnosis: Muscle weakness (generalized) (M62.81);Other abnormalities of gait and mobility (R26.89);Unsteadiness on feet (R26.81)     Time: 1040-1110 PT Time Calculation (min) (ACUTE ONLY): 30 min  Charges:  $Gait Training: 8-22 mins $Therapeutic Activity: 8-22 mins                    G Codes:       09/07/17  Carson Bing, PT [phone]  (pager)   Eliseo Gum Rayen Palen 09/07/2017, 11:18 AM

## 2017-08-10 ENCOUNTER — Encounter (HOSPITAL_COMMUNITY): Payer: Self-pay

## 2017-08-10 ENCOUNTER — Encounter (HOSPITAL_COMMUNITY)
Admission: EM | Disposition: A | Discharge status: Skilled Nursing Facility | Payer: Self-pay | Source: Home / Self Care | Attending: Family Medicine

## 2017-08-10 ENCOUNTER — Inpatient Hospital Stay (HOSPITAL_COMMUNITY): Payer: Self-pay

## 2017-08-10 DIAGNOSIS — R7881 Bacteremia: Secondary | ICD-10-CM

## 2017-08-10 HISTORY — PX: TEE WITHOUT CARDIOVERSION: SHX5443

## 2017-08-10 LAB — AEROBIC/ANAEROBIC CULTURE W GRAM STAIN (SURGICAL/DEEP WOUND)

## 2017-08-10 LAB — BASIC METABOLIC PANEL
Anion gap: 3 — ABNORMAL LOW (ref 5–15)
BUN: 7 mg/dL (ref 6–20)
CALCIUM: 7.5 mg/dL — AB (ref 8.9–10.3)
CO2: 27 mmol/L (ref 22–32)
CREATININE: 0.6 mg/dL — AB (ref 0.61–1.24)
Chloride: 104 mmol/L (ref 101–111)
GFR calc Af Amer: >60 mL/min (ref >60)
GLUCOSE: 109 mg/dL — AB (ref 65–99)
Potassium: 3.3 mmol/L — ABNORMAL LOW (ref 3.5–5.1)
Sodium: 134 mmol/L — ABNORMAL LOW (ref 135–145)

## 2017-08-10 LAB — GLUCOSE, CAPILLARY
GLUCOSE-CAPILLARY: 174 mg/dL — AB (ref 65–99)
Glucose-Capillary: 70 mg/dL (ref 65–99)
Glucose-Capillary: 85 mg/dL (ref 65–99)
Glucose-Capillary: 170 mg/dL — ABNORMAL HIGH (ref 65–99)
Glucose-Capillary: 188 mg/dL — ABNORMAL HIGH (ref 65–99)

## 2017-08-10 LAB — AEROBIC/ANAEROBIC CULTURE (SURGICAL/DEEP WOUND)

## 2017-08-10 SURGERY — ECHOCARDIOGRAM, TRANSESOPHAGEAL
Anesthesia: Moderate Sedation

## 2017-08-10 MED ORDER — SODIUM CHLORIDE 0.9 % IV SOLN
INTRAVENOUS | Status: DC
  Administered 2017-08-10: 500 mL via INTRAVENOUS

## 2017-08-10 MED ORDER — MIDAZOLAM HCL 10 MG/2ML IJ SOLN
INTRAMUSCULAR | Status: DC | PRN
  Administered 2017-08-10 (×2): 2 mg via INTRAVENOUS

## 2017-08-10 MED ORDER — BUTAMBEN-TETRACAINE-BENZOCAINE 2-2-14 % EX AERO
INHALATION_SPRAY | CUTANEOUS | Status: DC | PRN
  Administered 2017-08-10: 2 via TOPICAL

## 2017-08-10 MED ORDER — POTASSIUM CHLORIDE CRYS ER 20 MEQ PO TBCR
20.0000 meq | EXTENDED_RELEASE_TABLET | Freq: Once | ORAL | Status: AC
  Administered 2017-08-10: 20 meq via ORAL
  Filled 2017-08-10 (×2): qty 1

## 2017-08-10 MED ORDER — MIDAZOLAM HCL 5 MG/ML IJ SOLN
INTRAMUSCULAR | Status: AC
  Filled 2017-08-10: qty 2

## 2017-08-10 MED ORDER — ZOLPIDEM TARTRATE 5 MG PO TABS
5.0000 mg | ORAL_TABLET | Freq: Every evening | ORAL | Status: DC | PRN
Start: 2017-08-10 — End: 2017-08-13
  Administered 2017-08-11: 5 mg via ORAL
  Filled 2017-08-10: qty 1

## 2017-08-10 MED ORDER — FENTANYL CITRATE (PF) 100 MCG/2ML IJ SOLN
INTRAMUSCULAR | Status: DC | PRN
  Administered 2017-08-10 (×2): 25 ug via INTRAVENOUS

## 2017-08-10 MED ORDER — FUROSEMIDE 10 MG/ML IJ SOLN
40.0000 mg | Freq: Once | INTRAMUSCULAR | Status: AC
  Administered 2017-08-10: 40 mg via INTRAVENOUS
  Filled 2017-08-10: qty 4

## 2017-08-10 MED ORDER — GLUCERNA SHAKE PO LIQD
237.0000 mL | Freq: Two times a day (BID) | ORAL | Status: DC
  Administered 2017-08-11: 237 mL via ORAL

## 2017-08-10 MED ORDER — IOPAMIDOL (ISOVUE-300) INJECTION 61%
INTRAVENOUS | Status: AC
  Administered 2017-08-10: 75 mL
  Filled 2017-08-10: qty 75

## 2017-08-10 MED ORDER — FENTANYL CITRATE (PF) 100 MCG/2ML IJ SOLN
INTRAMUSCULAR | Status: AC
  Filled 2017-08-10: qty 2

## 2017-08-10 NOTE — Progress Notes (Signed)
PT Cancellation Note  Patient Details Name: Cameron Coffey MRN: [id number] DOB: 10/25/1961   Cancelled Treatment:    Reason Eval/Treat Not Completed: Patient declined, no reason specified.  For surgical procedure later and has been up independent all day.  08/10/2017  [place] Bing, PT [phone]  (pager)   Eliseo Gum Eyal Greenhaw 08/10/2017, 2:06 PM

## 2017-08-10 NOTE — Progress Notes (Signed)
PHARMACY CONSULT NOTE FOR:  OUTPATIENT  PARENTERAL ANTIBIOTIC THERAPY (OPAT)  Indication: MSSA bacteremia Regimen: Cefazolin 2 grams IV q 8 hours End date: September 16, 2017  IV antibiotic discharge orders are pended. To discharging provider:  please sign these orders via discharge navigator,  Select New Orders & click on the button choice - Manage This Unsigned Work.     Thank you for allowing pharmacy to be a part of this patient's care.  Thank you for allowing [place] to participate in this patients care. Signe Colt, PharmD

## 2017-08-10 NOTE — Progress Notes (Signed)
Day of Surgery   Subjective/Chief Complaint: Complains of being here, no issues with back, emptying drain himself so not sure of accuracy   Objective: Vital signs in last 24 hours: Temp:  [98.2 F (36.8 C)-98.8 F (37.1 C)] 98.5 F (36.9 C) (10/02 1020) Pulse Rate:  [54-76] 62 (10/02 1030) Resp:  [15-21] 16 (10/02 1030) BP: (96-123)/(61-78) 101/66 (10/02 1030) SpO2:  [93 %-100 %] 99 % (10/02 1030) Last BM Date: 08/07/17  Intake/Output from previous day: 10/01 0701 - 10/02 0700 In: 545 [P.O.:240; IV Piggyback:300] Out: 1000 [Urine:1000] Intake/Output this shift: No intake/output data recorded.  back drain in place with purulent fluid, no erythema or tenderness in this area  Lab Results:  No results for input(s): WBC, HGB, HCT, PLT in the last 72 hours. BMET  Recent Labs  08/09/17 0454 08/10/17 0403  NA 134* 134*  K 3.1* 3.3*  CL 103 104  CO2 28 27  GLUCOSE 171* 109*  BUN 6 7  CREATININE 0.60* 0.60*  CALCIUM 7.3* 7.5*   PT/INR No results for input(s): LABPROT, INR in the last 72 hours. ABG No results for input(s): PHART, HCO3 in the last 72 hours.  Invalid input(s): PCO2, PO2  Studies/Results: No results found.  Anti-infectives: Anti-infectives    Start     Dose/Rate Route Frequency Ordered Stop   08/06/17 1700  [MAR Hold]  ceFAZolin (ANCEF) IVPB 2g/100 mL premix     (MAR Hold since 08/10/17 0920)   2 g 200 mL/hr over 30 Minutes Intravenous Every 8 hours 08/06/17 1547     08/05/17 0200  vancomycin (VANCOCIN) IVPB 750 mg/150 ml premix  Status:  Discontinued     750 mg 150 mL/hr over 60 Minutes Intravenous Every 8 hours 08/04/17 1549 08/06/17 1530   08/04/17 2200  piperacillin-tazobactam (ZOSYN) IVPB 3.375 g  Status:  Discontinued     3.375 g 12.5 mL/hr over 240 Minutes Intravenous Every 8 hours 08/04/17 1549 08/06/17 1530   08/04/17 1445  vancomycin (VANCOCIN) 1,250 mg in sodium chloride 0.9 % 250 mL IVPB     1,250 mg 166.7 mL/hr over 90 Minutes  Intravenous  Once 08/04/17 1434 08/04/17 1835   08/04/17 1445  piperacillin-tazobactam (ZOSYN) IVPB 3.375 g     3.375 g 100 mL/hr over 30 Minutes Intravenous  Once 08/04/17 1434 08/04/17 1627      Assessment/Plan: Scapular abscess mssa bacteremia  Continue abx per ID Undergoing eval for bacteremia Im not sure this is adequately drained right now, will get ct today and may need or tomorrow if still a large collection present  [facility] 08/10/2017

## 2017-08-10 NOTE — Progress Notes (Signed)
Nutrition Follow-up  DOCUMENTATION CODES:   Not applicable  INTERVENTION:   -Glucerna Shake po BID, each supplement provides 220 kcal and 10 grams of protein -Continue MVI daily -D/c Prostat BID  NUTRITION DIAGNOSIS:   Increased nutrient needs related to wound healing as evidenced by estimated needs.  Ongoing  GOAL:   Patient will meet greater than or equal to 90% of their needs  Progressing  MONITOR:   PO intake, Supplement acceptance, Labs, Weight trends, Skin, I & O's  REASON FOR ASSESSMENT:   Malnutrition Screening Tool    ASSESSMENT:   Cameron Coffey is a 56 y.o. male who presents with subscapular abscess that will likely need IR for drainage  9/27- drainage of lt posterior back/shoulder abscess (50 ml removed, cultures sent), 10 f grain to gravity 10/2- s/p TEE  Pt undergoing work-up for bacteremia.   Spoke with pt at bedside. He reports great appetite, consuming 95-100% of meals. He shares that he often orders the same food items, as he has identified the items that he enjoys on the menu (pt had cheeseburger, soft drink, and potato chips for lunch). Encouraged pt to continue to eat food at meals.   Per MAR, pt is refusing Prostat, but taking MVI.  Visit cut short, due to pt request to use restroom.   Labs reviewed: Na: 134, K: 3.3, CBGS: 70-170 (current orders for inpatient glycemic control are 0-9 units insulin aspart TID with meals, 14 units insulin glargine daily).   Diet Order:  Diet Carb Modified Fluid consistency: Thin; Room service appropriate? Yes  Skin:  Wound (see comment) (closed penile incision, st 2 buttocks)  Last BM:  08/10/17  Height:   Ht Readings from Last 1 Encounters:  08/04/17  (1.753 m)    Weight:   Wt Readings from Last 1 Encounters:  08/04/17 168 lb (76.2 kg)    Ideal Body Weight:  72.7 kg  BMI:  Body mass index is 24.81 kg/m.  Estimated Nutritional Needs:   Kcal:  1900-2100  Protein:  100-115  grams  Fluid:  1.9-2.1 L  EDUCATION NEEDS:   Education needs no appropriate at this time  Addysen Louth A. Mayford Knife, RD, LDN, CDE Pager: [phone] After hours Pager: [phone]

## 2017-08-10 NOTE — Progress Notes (Signed)
Subjective:  He told me today that he was amenable to SNF but just not one he was at recently he also co BV with BG in 70s and asked I inform his RN which I did  Antibiotics:  Anti-infectives    Start     Dose/Rate Route Frequency Ordered Stop   08/06/17 1700  ceFAZolin (ANCEF) IVPB 2g/100 mL premix     2 g 200 mL/hr over 30 Minutes Intravenous Every 8 hours 08/06/17 1547     08/05/17 0200  vancomycin (VANCOCIN) IVPB 750 mg/150 ml premix  Status:  Discontinued     750 mg 150 mL/hr over 60 Minutes Intravenous Every 8 hours 08/04/17 1549 08/06/17 1530   08/04/17 2200  piperacillin-tazobactam (ZOSYN) IVPB 3.375 g  Status:  Discontinued     3.375 g 12.5 mL/hr over 240 Minutes Intravenous Every 8 hours 08/04/17 1549 08/06/17 1530   08/04/17 1445  vancomycin (VANCOCIN) 1,250 mg in sodium chloride 0.9 % 250 mL IVPB     1,250 mg 166.7 mL/hr over 90 Minutes Intravenous  Once 08/04/17 1434 08/04/17 1835   08/04/17 1445  piperacillin-tazobactam (ZOSYN) IVPB 3.375 g     3.375 g 100 mL/hr over 30 Minutes Intravenous  Once 08/04/17 1434 08/04/17 1627      Medications: Scheduled Meds: . docusate sodium  100 mg Oral BID  . feeding supplement (GLUCERNA SHAKE)  237 mL Oral BID BM  . insulin aspart  0-9 Units Subcutaneous TID WC  . insulin glargine  14 Units Subcutaneous QHS  . lidocaine-EPINEPHrine  20 mL Intradermal Once  . lisinopril  5 mg Oral Daily  . metoprolol tartrate  12.5 mg Oral BID  . multivitamin with minerals  1 tablet Oral Daily  . simvastatin  40 mg Oral Daily  . sodium chloride flush  5 mL Intravenous Q8H   Continuous Infusions: .  ceFAZolin (ANCEF) IV Stopped (08/10/17 1154)   PRN Meds:.acetaminophen **OR** acetaminophen, bisacodyl, HYDROcodone-acetaminophen, ondansetron **OR** ondansetron (ZOFRAN) IV, senna-docusate, sodium chloride flush    Objective: Weight change:   Intake/Output Summary (Last 24 hours) at 08/10/17 1932 Last data filed at 08/10/17  1837  Gross per 24 hour  Intake              410 ml  Output             1075 ml  Net             -665 ml   Blood pressure (!) 155/77, pulse 67, temperature 98.4 F (36.9 C), temperature source Oral, resp. rate 20, height 5' 9"  (1.753 m), weight 168 lb (76.2 kg), SpO2 100 %. Temp:  [98.3 F (36.8 C)-98.5 F (36.9 C)] 98.4 F (36.9 C) (10/02 1520) Pulse Rate:  [54-76] 67 (10/02 1103) Resp:  [15-21] 20 (10/02 1520) BP: (96-155)/(61-78) 155/77 (10/02 1520) SpO2:  [93 %-100 %] 100 % (10/02 1520)  Physical Exam: General: Alert and awake, oriented x3, not in any acute distress. HEENT: anicteric sclera, , EOMI CVS regular rate, normal r,  no murmur rubs or gallops Chest:diminised BS left side but otherwiseclear to auscultation bilaterally, no wheezing, rales or rhonchi Abdomen: softnondistended, normal bowel sounds, Extremities: no  clubbing or edema noted bilaterally Skin: no rashes, tube with purulent material Neuro: nonfocal  CBC: CBC Latest Ref Rng & Units 08/05/2017 08/04/2017 08/04/2017  WBC 4.0 - 10.5 K/uL 5.4 - 6.4  Hemoglobin 13.0 - 17.0 g/dL 9.7(L) 12.6(L) 10.4(L)  Hematocrit 39.0 -  52.0 % 28.5(L) 37.0(L) 29.7(L)  Platelets 150 - 400 K/uL 106(L) - 103(L)      BMET  Recent Labs  08/09/17 0454 08/10/17 0403  NA 134* 134*  K 3.1* 3.3*  CL 103 104  CO2 28 27  GLUCOSE 171* 109*  BUN 6 7  CREATININE 0.60* 0.60*  CALCIUM 7.3* 7.5*     Liver Panel  No results for input(s): PROT, ALBUMIN, AST, ALT, ALKPHOS, BILITOT, BILIDIR, IBILI in the last 72 hours.     Sedimentation Rate No results for input(s): ESRSEDRATE in the last 72 hours. C-Reactive Protein No results for input(s): CRP in the last 72 hours.  Micro Results: Recent Results (from the past 720 hour(s))  Blood culture (routine x 2)     Status: Abnormal   Collection Time: 07/24/17  4:24 AM  Result Value Ref Range Status   Specimen Description BLOOD LEFT HAND  Final   Special Requests   Final     BOTTLES DRAWN AEROBIC AND ANAEROBIC Blood Culture adequate volume   Culture  Setup Time   Final    GRAM POSITIVE COCCI IN BOTH AEROBIC AND ANAEROBIC BOTTLES CRITICAL RESULT CALLED TO, READ BACK BY AND VERIFIED WITH: [employer] [place] HALLAJI AT 1938 07/24/17.PMH    Culture (A)  Final    STAPHYLOCOCCUS AUREUS SUSCEPTIBILITIES PERFORMED ON PREVIOUS CULTURE WITHIN THE LAST 5 DAYS. Performed at [facility], [place] [address] [place], [place] [address]    Report Status 07/27/2017 FINAL  Final  Blood culture (routine x 2)     Status: Abnormal   Collection Time: 07/24/17  4:24 AM  Result Value Ref Range Status   Specimen Description BLOOD RIGHT HAND  Final   Special Requests   Final    BOTTLES DRAWN AEROBIC AND ANAEROBIC Blood Culture adequate volume   Culture  Setup Time   Final    GRAM POSITIVE COCCI IN BOTH AEROBIC AND ANAEROBIC BOTTLES CRITICAL RESULT CALLED TO, READ BACK BY AND VERIFIED WITH: [facility] HALLAJI AT 1938 07/24/17.PMH    Culture STAPHYLOCOCCUS AUREUS (A)  Final   Report Status 07/27/2017 FINAL  Final   Organism ID, Bacteria STAPHYLOCOCCUS AUREUS  Final      Susceptibility   Staphylococcus aureus - MIC*    CIPROFLOXACIN <=0.5 SENSITIVE Sensitive     ERYTHROMYCIN <=0.25 SENSITIVE Sensitive     GENTAMICIN <=0.5 SENSITIVE Sensitive     OXACILLIN 0.5 SENSITIVE Sensitive     TETRACYCLINE <=1 SENSITIVE Sensitive     VANCOMYCIN <=0.5 SENSITIVE Sensitive     TRIMETH/SULFA <=10 SENSITIVE Sensitive     CLINDAMYCIN <=0.25 SENSITIVE Sensitive     RIFAMPIN <=0.5 SENSITIVE Sensitive     Inducible Clindamycin NEGATIVE Sensitive     * STAPHYLOCOCCUS AUREUS  Blood Culture ID Panel (Reflexed)     Status: Abnormal   Collection Time: 07/24/17  4:24 AM  Result Value Ref Range Status   Enterococcus species NOT DETECTED NOT DETECTED Final   Listeria monocytogenes NOT DETECTED NOT DETECTED Final   Staphylococcus species DETECTED (A) NOT DETECTED Final    Comment: CRITICAL RESULT CALLED TO, READ  BACK BY AND VERIFIED WITH: SHEEMA HALLAJI AT 1938 07/24/17.PMH    Staphylococcus aureus DETECTED (A) NOT DETECTED Final    Comment: Methicillin (oxacillin) susceptible Staphylococcus aureus (MSSA). Preferred therapy is anti staphylococcal beta lactam antibiotic (Cefazolin or Nafcillin), unless clinically contraindicated. CRITICAL RESULT CALLED TO, READ BACK BY AND VERIFIED WITH: [facility] HALLAJI AT 1938 07/24/17.PMH    Methicillin resistance NOT DETECTED  NOT DETECTED Final   Streptococcus species NOT DETECTED NOT DETECTED Final   Streptococcus agalactiae NOT DETECTED NOT DETECTED Final   Streptococcus pneumoniae NOT DETECTED NOT DETECTED Final   Streptococcus pyogenes NOT DETECTED NOT DETECTED Final   Acinetobacter baumannii NOT DETECTED NOT DETECTED Final   Enterobacteriaceae species NOT DETECTED NOT DETECTED Final   Enterobacter cloacae complex NOT DETECTED NOT DETECTED Final   Escherichia coli NOT DETECTED NOT DETECTED Final   Klebsiella oxytoca NOT DETECTED NOT DETECTED Final   Klebsiella pneumoniae NOT DETECTED NOT DETECTED Final   Proteus species NOT DETECTED NOT DETECTED Final   Serratia marcescens NOT DETECTED NOT DETECTED Final   Haemophilus influenzae NOT DETECTED NOT DETECTED Final   Neisseria meningitidis NOT DETECTED NOT DETECTED Final   Pseudomonas aeruginosa NOT DETECTED NOT DETECTED Final   Candida albicans NOT DETECTED NOT DETECTED Final   Candida glabrata NOT DETECTED NOT DETECTED Final   Candida krusei NOT DETECTED NOT DETECTED Final   Candida parapsilosis NOT DETECTED NOT DETECTED Final   Candida tropicalis NOT DETECTED NOT DETECTED Final  Urine Culture     Status: Abnormal   Collection Time: 07/25/17  3:34 AM  Result Value Ref Range Status   Specimen Description URINE, RANDOM  Final   Special Requests NONE  Final   Culture >=100,000 COLONIES/mL STAPHYLOCOCCUS AUREUS (A)  Final   Report Status 07/27/2017 FINAL  Final   Organism ID, Bacteria STAPHYLOCOCCUS AUREUS  (A)  Final      Susceptibility   Staphylococcus aureus - MIC*    CIPROFLOXACIN <=0.5 SENSITIVE Sensitive     GENTAMICIN <=0.5 SENSITIVE Sensitive     NITROFURANTOIN <=16 SENSITIVE Sensitive     OXACILLIN <=0.25 SENSITIVE Sensitive     TETRACYCLINE <=1 SENSITIVE Sensitive     VANCOMYCIN 1 SENSITIVE Sensitive     TRIMETH/SULFA <=10 SENSITIVE Sensitive     CLINDAMYCIN <=0.25 SENSITIVE Sensitive     RIFAMPIN <=0.5 SENSITIVE Sensitive     Inducible Clindamycin NEGATIVE Sensitive     * >=100,000 COLONIES/mL STAPHYLOCOCCUS AUREUS  Culture, blood (Routine X 2) w Reflex to ID Panel     Status: Abnormal   Collection Time: 07/25/17  5:17 PM  Result Value Ref Range Status   Specimen Description BLOOD RIGHT ANTECUBITAL  Final   Special Requests   Final    BOTTLES DRAWN AEROBIC AND ANAEROBIC Blood Culture adequate volume   Culture  Setup Time   Final    GRAM POSITIVE COCCI IN BOTH AEROBIC AND ANAEROBIC BOTTLES CRITICAL RESULT CALLED TO, READ BACK BY AND VERIFIED WITH: KAREN HAYES AT 1444 ON 07/26/2017 JJB    Culture (A)  Final    STAPHYLOCOCCUS AUREUS SUSCEPTIBILITIES PERFORMED ON PREVIOUS CULTURE WITHIN THE LAST 5 DAYS. Performed at [facility], [place] [address], [place], [place] [address]    Report Status 07/29/2017 FINAL  Final  Culture, blood (Routine X 2) w Reflex to ID Panel     Status: None   Collection Time: 07/25/17  5:25 PM  Result Value Ref Range Status   Specimen Description BLOOD LEFT ANTECUBITAL  Final   Special Requests   Final    BOTTLES DRAWN AEROBIC AND ANAEROBIC Blood Culture results may not be optimal due to an excessive volume of blood received in culture bottles   Culture NO GROWTH 5 DAYS  Final   Report Status 07/30/2017 FINAL  Final  Culture, blood (single) w Reflex to ID Panel     Status: None   Collection  Time: 07/26/17  4:54 PM  Result Value Ref Range Status   Specimen Description BLOOD LEFT ANTECUBITAL  Final   Special Requests   Final    BOTTLES DRAWN  AEROBIC AND ANAEROBIC Blood Culture adequate volume   Culture NO GROWTH 5 DAYS  Final   Report Status 07/31/2017 FINAL  Final  Blood culture (routine x 2)     Status: None   Collection Time: 08/04/17 11:45 AM  Result Value Ref Range Status   Specimen Description BLOOD LEFT ANTECUBITAL  Final   Special Requests   Final    BOTTLES DRAWN AEROBIC AND ANAEROBIC Blood Culture adequate volume   Culture NO GROWTH 5 DAYS  Final   Report Status 08/09/2017 FINAL  Final  Blood culture (routine x 2)     Status: None   Collection Time: 08/04/17 11:52 AM  Result Value Ref Range Status   Specimen Description BLOOD LEFT WRIST  Final   Special Requests   Final    BOTTLES DRAWN AEROBIC AND ANAEROBIC Blood Culture adequate volume   Culture NO GROWTH 5 DAYS  Final   Report Status 08/09/2017 FINAL  Final  Urine culture     Status: Abnormal   Collection Time: 08/04/17  5:07 PM  Result Value Ref Range Status   Specimen Description URINE, CLEAN CATCH  Final   Special Requests NONE  Final   Culture >=100,000 COLONIES/mL YEAST (A)  Final   Report Status 08/05/2017 FINAL  Final  MRSA PCR Screening     Status: None   Collection Time: 08/04/17  6:56 PM  Result Value Ref Range Status   MRSA by PCR NEGATIVE NEGATIVE Final    Comment:        The GeneXpert MRSA Assay (FDA approved for NASAL specimens only), is one component of a comprehensive MRSA colonization surveillance program. It is not intended to diagnose MRSA infection nor to guide or monitor treatment for MRSA infections.   Aerobic/Anaerobic Culture (surgical/deep wound)     Status: None   Collection Time: 08/05/17  4:28 PM  Result Value Ref Range Status   Specimen Description ABSCESS BACK  Final   Special Requests NONE  Final   Gram Stain   Final    ABUNDANT WBC PRESENT, PREDOMINANTLY PMN FEW GRAM POSITIVE COCCI IN CLUSTERS    Culture FEW STAPHYLOCOCCUS AUREUS NO ANAEROBES ISOLATED   Final   Report Status 08/10/2017 FINAL  Final    Organism ID, Bacteria STAPHYLOCOCCUS AUREUS  Final      Susceptibility   Staphylococcus aureus - MIC*    CIPROFLOXACIN <=0.5 SENSITIVE Sensitive     ERYTHROMYCIN <=0.25 SENSITIVE Sensitive     GENTAMICIN <=0.5 SENSITIVE Sensitive     OXACILLIN <=0.25 SENSITIVE Sensitive     TETRACYCLINE <=1 SENSITIVE Sensitive     VANCOMYCIN <=0.5 SENSITIVE Sensitive     TRIMETH/SULFA <=10 SENSITIVE Sensitive     CLINDAMYCIN <=0.25 SENSITIVE Sensitive     RIFAMPIN <=0.5 SENSITIVE Sensitive     Inducible Clindamycin NEGATIVE Sensitive     * FEW STAPHYLOCOCCUS AUREUS    Studies/Results: Ct Chest W Contrast  Result Date: 08/10/2017 CLINICAL DATA:  Followup chest wall fluid collection. EXAM: CT CHEST WITH CONTRAST TECHNIQUE: Multidetector CT imaging of the chest was performed during intravenous contrast administration. CONTRAST:  25m ISOVUE-300 IOPAMIDOL (ISOVUE-300) INJECTION 61% COMPARISON:  Chest CT 08/04/2017 FINDINGS: Cardiovascular: The heart is normal in size. No pericardial effusion. The aorta is normal in caliber. No dissection. The scattered atherosclerotic calcifications. Aberrant  right subclavian artery again demonstrated. The pulmonary arteries are grossly normal. Mediastinum/Nodes: No mediastinal or hilar mass or adenopathy. Small scattered lymph nodes are stable. The esophagus is grossly normal. Lungs/Pleura: Persistent small left pleural effusion with minimal overlying left basilar atelectasis. No edema, focal infiltrates or worrisome pulmonary lesions. Upper Abdomen: Advanced cirrhotic changes involving the liver with portal venous hypertension, portal venous collaterals, ascites and mild splenomegaly. No obvious liver lesions. Cholelithiasis is noted. Musculoskeletal: Left-sided chest wall drainage catheter is still in place. I do not see any residual fluid collection/abscess. The right-sided rim enhancing fluid collection/abscess measures 2.3 cm and previously measured 4.0 cm. No new abscesses are  identified. IMPRESSION: 1. Resolution of left chest wall abscess. 2. Interval decrease in size of the right chest wall abscess. 3. Small persistent left pleural effusion with overlying atelectasis. 4. Stable advanced cirrhotic changes and the sequela of such. Aortic Atherosclerosis (ICD10-I70.0). Electronically Signed   By: Marijo Sanes M.D.   On: 08/10/2017 16:48      Assessment/Plan:  INTERVAL HISTORY:  Pt is sp IR drainage, CT shows left sided abscess has resolved and right sided one decreased in dimensions  TEE without vegetations    Active Problems:   Essential hypertension   Dyslipidemia associated with type 2 diabetes mellitus (HCC)   Type II diabetes with long term use of insulin (HCC)   MSSA (methicillin susceptible Staphylococcus aureus) septicemia (HCC)   Mass on back   Acute bilateral back pain   Constipation, chronic   Bilateral nephrolithiasis   Pain in scapula   Abscess of scapular region   Pressure injury of skin   Staphylococcus aureus bacteremia with sepsis (HCC)    Cameron Coffey is a 56 y.o. male with  MSSA bacteremia discovered when he had prostatic abscess (though he also had antecedent infection on face with boil) on appropriate antibiotics but now found to have MSSA "metastatic" infection with chest wall abscess sp IR drainage  #1 Chest wall abscess with MSSA;  He is going to need protracted IV antibotics for this process as well  I would treat him with another 6 weeks of IV abx with day #1 being 08/05/17 when abscess was drained  He will need to be serially imaged to ensure resolution of this process and ultimately may very well need a surgical intervention given virulence of the organism  #2 MSSA bacteremia with metastatic infection:  TEE negative  #3 Prostatic abscess sp TURP. Will get CT abdomen to ensure no pathology here  Diagnosis: MSSA bacteremia and chest wall abscesses  Culture Result: MSSA  No Known Allergies  OPAT  Orders Discharge antibiotics: Per pharmacy protocol  Cefazolin 2 g IV q 8 hours   Duration: 6 weeks  End Date: November 7th  [facility] Care Per Protocol:  Labs weekly while on IV antibiotics: _x_ CBC with differential x__ BMP _x_ CRP _x_ ESR   x__ Please pull PIC at completion of IV antibiotics __ Please leave PIC in place until doctor has seen patient or been notified  Fax weekly labs to [phone]  Clinic Follow Up Appt:  4 weeks      LOS: 6 days   Alcide Evener 08/10/2017, 7:32 PM

## 2017-08-10 NOTE — Progress Notes (Signed)
    Transesophageal Echocardiogram Note  Cameron Coffey [id number] 1961/05/19  Procedure: Transesophageal Echocardiogram Indications: Bacteremia  Procedure Details Consent: Obtained Time Out: Verified patient identification, verified procedure, site/side was marked, verified correct patient position, special equipment/implants available, Radiology Safety Procedures followed,  medications/allergies/relevent history reviewed, required imaging and test results available.  Performed  Medications:  During this procedure the patient is administered a total of Versed 4 mg and Fentanyl 50 mcg  to achieve and maintain moderate conscious sedation.  The patient's heart rate, blood pressure, and oxygen saturation are monitored continuously during the procedure. The period of conscious sedation is 30 minutes, of which I was present face-to-face 100% of this time.  Normal LV function; no vegetations; full report to follow   Complications: No apparent complications Patient did tolerate procedure well.  Olga Millers, MD

## 2017-08-10 NOTE — Progress Notes (Signed)
Fentanyl waste documented in Pyxis twice. The second waste of was in error.

## 2017-08-10 NOTE — Progress Notes (Signed)
PROGRESS NOTE    Cameron Coffey  WUJ:[id number] DOB: 1961/08/02 DOA: 08/04/2017 PCP: Lorre Munroe, NP    Brief Narrative:  Cameron Coffey is a 56 y.o. male who presents with subscapular abscess that will likely need IR for drainage. IR consult placed. Continue current ABX regimen. Urology evaluated patient in no surgical procedures recommended this moment. ID consulted to assist with case and general surgery also on board. Drain was placed but patient has been discarding contents within the bag as such unable to have accurate account of how much output from the bag. General surgery recommending rescanning patient and possible operation pending on results of CT scan. Infectious disease recommended obtaining TEE for MSSA bacteremia. Cardiology consulted and patient recently had TEE results pending.  Assessment & Plan:     Abscess of scapular region, MSSA bacteremia - IR was consulted and patient is status post ultrasound guided drainage of abscess. - Will follow-up with culture results for now antibiotics per ID recommendations. Per ID: He is going to need protracted IV antibotics for this process as well  I would treat him with another 6 weeks of IV abx with day #1 being 08/05/17 when abscess was drained  He will need to be serially imaged to ensure resolution of this process and ultimately may very well need a surgical intervention given virulence of the organism  #2 MSSA bacteremia with metastatic infection:  --would obtaine TEE to evaluate his valves  #3 Prostatic abscess sp TURP. If he has any abdominal complaints would re CT abdomen  Patient is status post TEE and results pending  Prostatic abscess - consulted Urology and recommended the following: Irrigated Foley catheter at this time. if he begins to have voiding complaints or mild retention of urine, he can be started on Flomax 0.4 mg daily. No Urological intervention recommended. Dr. Sampson Goon who recommended IV  Ancef every 8 hours until October 29 - ID states that if patient has abdominal complaints we should re-CT abdomen  Active Problems:   Essential hypertension - Relatively well controlled on lisinopril and metoprolol    Dyslipidemia associated with type 2 diabetes mellitus (HCC) -  Pt is on Statin, no chest pain reported. stable    Type II diabetes with long term use of insulin (HCC) - Pt is on carb modified diet and lantus as well as SSI    MSSA (methicillin susceptible Staphylococcus aureus) septicemia (HCC) - Pt on vancomycin    Acute bilateral back pain - secondary to abscess - continue supportive therapy. Pt is s/p procedure listed above and below    Constipation, chronic - stable on dulcolax    Bilateral nephrolithiasis  Hypokalemia - replaced again today. - reassess next am. BMP order placed.   DVT prophylaxis: SCD's Code Status: Coffey Family Communication: d/c mother and sister.  Disposition Plan: Pending recommendations from specialist   Consultants:   Radiology  General surgery  Urology  Infectious disease   Procedures: US guided drainage of left posterior back/shoulder abscess  Antimicrobials: Zosyn, Vancomycin  Subjective: Pt has no new complaints.   Objective: Vitals:   08/10/17 1030 08/10/17 1040 08/10/17 1103 08/10/17 1520  BP: 101/66 108/70 107/63 (!) 155/77  Pulse: 62 (!) 59 67   Resp: Temp:   98.3 F (36.8 C) 98.4 F (36.9 C)  TempSrc:   Oral Oral  SpO2: 99% 98% 100% 100%  Weight:      Height:        Intake/Output  Summary (Last 24 hours) at 08/10/17 1643 Last data filed at 08/10/17 1541  Gross per 24 hour  Intake              405 ml  Output              750 ml  Net             -345 ml   Filed Weights   08/04/17 0956 08/04/17 1757  Weight: 68 kg (150 lb) 76.2 kg (168 lb)    Examination:Exam unchanged when compared to 08/09/2017  General exam: Appears calm and comfortable, in nad.   Respiratory system: Clear  to auscultation. Respiratory effort normal. Equal chest rise. Cardiovascular system: S1 & S2 heard, RRR. No JVD, murmurs, rubs, gallops or clicks Gastrointestinal system: Abdomen is nondistended, soft and nontender. No organomegaly or masses felt. Normal bowel sounds heard. Central nervous system: Alert and oriented. No focal neurological deficits. Extremities: Symmetric 5 x 5 power. Skin: No rashes or ulcers, warm and dry, pt has drain in place with serosanguinous fluid in bag Psychiatry:  Mood & affect appropriate.     Data Reviewed: I have personally reviewed following labs and imaging studies  CBC:  Recent Labs Lab 08/04/17 1145 08/04/17 1206 08/05/17 0459  WBC 6.4  --  5.4  NEUTROABS 4.1  --   --   HGB 10.4* 12.6* 9.7*  HCT 29.7* 37.0* 28.5*  MCV 88.1  --  88.2  PLT 103*  --  106*   Basic Metabolic Panel:  Recent Labs Lab 08/06/17 0411 08/07/17 0326 08/08/17 0346 08/09/17 0454 08/10/17 0403  NA 132* 134* 135 134* 134*  K 3.5 3.1* 3.4* 3.1* 3.3*  CL 101 104 102 103 104  CO2 GLUCOSE 181* 137* 121* 171* 109*  BUN <5* 5* CREATININE 0.68 0.56* 0.62 0.60* 0.60*  CALCIUM 7.6* 7.4* 7.6* 7.3* 7.5*   GFR: Estimated Creatinine Clearance: 104.3 mL/min (A) (by C-G formula based on SCr of 0.6 mg/dL (L)). Liver Function Tests:  Recent Labs Lab 08/04/17 1145 08/05/17 0459  AST 92* 85*  ALT 36 34  ALKPHOS 106 105  BILITOT 1.9* 1.8*  PROT 9.0* 8.0  ALBUMIN 1.5* 1.3*   No results for input(s): LIPASE, AMYLASE in the last 168 hours. No results for input(s): AMMONIA in the last 168 hours. Coagulation Profile:  Recent Labs Lab 08/05/17 0459  INR 1.32   Cardiac Enzymes: No results for input(s): CKTOTAL, CKMB, CKMBINDEX, TROPONINI in the last 168 hours. BNP (last 3 results) No results for input(s): PROBNP in the last 8760 hours. HbA1C: No results for input(s): HGBA1C in the last 72 hours. CBG:  Recent Labs Lab 08/09/17 1650  08/09/17 2122 08/10/17 0752 08/10/17 0901 08/10/17 1147  GLUCAP 190* 197* 70 85 170*   Lipid Profile: No results for input(s): CHOL, HDL, LDLCALC, TRIG, CHOLHDL, LDLDIRECT in the last 72 hours. Thyroid Function Tests: No results for input(s): TSH, T4TOTAL, FREET4, T3FREE, THYROIDAB in the last 72 hours. Anemia Panel: No results for input(s): VITAMINB12, FOLATE, FERRITIN, TIBC, IRON, RETICCTPCT in the last 72 hours. Sepsis Labs:  Recent Labs Lab 08/04/17 1206 08/04/17 1616  LATICACIDVEN 2.47* 2.65*    Recent Results (from the past 240 hour(s))  Blood culture (routine x 2)     Status: None   Collection Time: 08/04/17 11:45 AM  Result Value Ref Range Status   Specimen Description BLOOD LEFT ANTECUBITAL  Final   Special  Requests   Final    BOTTLES DRAWN AEROBIC AND ANAEROBIC Blood Culture adequate volume   Culture NO GROWTH 5 DAYS  Final   Report Status 08/09/2017 FINAL  Final  Blood culture (routine x 2)     Status: None   Collection Time: 08/04/17 11:52 AM  Result Value Ref Range Status   Specimen Description BLOOD LEFT WRIST  Final   Special Requests   Final    BOTTLES DRAWN AEROBIC AND ANAEROBIC Blood Culture adequate volume   Culture NO GROWTH 5 DAYS  Final   Report Status 08/09/2017 FINAL  Final  Urine culture     Status: Abnormal   Collection Time: 08/04/17  5:07 PM  Result Value Ref Range Status   Specimen Description URINE, CLEAN CATCH  Final   Special Requests NONE  Final   Culture >=100,000 COLONIES/mL YEAST (A)  Final   Report Status 08/05/2017 FINAL  Final  MRSA PCR Screening     Status: None   Collection Time: 08/04/17  6:56 PM  Result Value Ref Range Status   MRSA by PCR NEGATIVE NEGATIVE Final    Comment:        The GeneXpert MRSA Assay (FDA approved for NASAL specimens only), is one component of a comprehensive MRSA colonization surveillance program. It is not intended to diagnose MRSA infection nor to guide or monitor treatment for MRSA  infections.   Aerobic/Anaerobic Culture (surgical/deep wound)     Status: None   Collection Time: 08/05/17  4:28 PM  Result Value Ref Range Status   Specimen Description ABSCESS BACK  Final   Special Requests NONE  Final   Gram Stain   Final    ABUNDANT WBC PRESENT, PREDOMINANTLY PMN FEW GRAM POSITIVE COCCI IN CLUSTERS    Culture FEW STAPHYLOCOCCUS AUREUS NO ANAEROBES ISOLATED   Final   Report Status 08/10/2017 FINAL  Final   Organism ID, Bacteria STAPHYLOCOCCUS AUREUS  Final      Susceptibility   Staphylococcus aureus - MIC*    CIPROFLOXACIN <=0.5 SENSITIVE Sensitive     ERYTHROMYCIN <=0.25 SENSITIVE Sensitive     GENTAMICIN <=0.5 SENSITIVE Sensitive     OXACILLIN <=0.25 SENSITIVE Sensitive     TETRACYCLINE <=1 SENSITIVE Sensitive     VANCOMYCIN <=0.5 SENSITIVE Sensitive     TRIMETH/SULFA <=10 SENSITIVE Sensitive     CLINDAMYCIN <=0.25 SENSITIVE Sensitive     RIFAMPIN <=0.5 SENSITIVE Sensitive     Inducible Clindamycin NEGATIVE Sensitive     * FEW STAPHYLOCOCCUS AUREUS         Radiology Studies: No results found.      Scheduled Meds: . docusate sodium  100 mg Oral BID  . feeding supplement (GLUCERNA SHAKE)  237 mL Oral BID BM  . insulin aspart  0-9 Units Subcutaneous TID WC  . insulin glargine  14 Units Subcutaneous QHS  . lidocaine-EPINEPHrine  20 mL Intradermal Once  . lisinopril  5 mg Oral Daily  . metoprolol tartrate  12.5 mg Oral BID  . multivitamin with minerals  1 tablet Oral Daily  . simvastatin  40 mg Oral Daily  . sodium chloride flush  5 mL Intravenous Q8H   Continuous Infusions: .  ceFAZolin (ANCEF) IV Stopped (08/10/17 1154)     LOS: 6 days    Time spent: > 35 minutes  Penny Pia, MD [facility] Pager [phone]  If 7PM-7AM, please contact night-coverage [email or web address] Password [email or web address] 08/10/2017, 4:43 PM

## 2017-08-10 NOTE — Interval H&P Note (Signed)
History and Physical Interval Note:  08/10/2017 9:29 AM  Cameron Coffey  has presented today for surgery, with the diagnosis of bacteremia  The various methods of treatment have been discussed with the patient and family. After consideration of risks, benefits and other options for treatment, the patient has consented to  Procedure(s): TRANSESOPHAGEAL ECHOCARDIOGRAM (TEE) (N/A) as a surgical intervention .  The patient's history has been reviewed, patient examined, no change in status, stable for surgery.  I have reviewed the patient's chart and labs.  Questions were answered to the patient's satisfaction.     Olga Millers

## 2017-08-10 NOTE — Progress Notes (Signed)
  Echocardiogram Echocardiogram Transesophageal has been performed.  Roosvelt Maser F 08/10/2017, 10:33 AM

## 2017-08-10 NOTE — H&P (View-Only) (Signed)
PROGRESS NOTE    Cameron Coffey  ZOX:[id number] DOB: January 26, 1961 DOA: 08/04/2017 PCP: Lorre Munroe, NP    Brief Narrative:  Cameron Coffey is a 56 y.o. male who presents with subscapular abscess that will likely need IR for drainage. IR consult placed. Continue current ABX regimen. Additionally Urology to see pt due to persistent pain from recent TURP.  Assessment & Plan:     Abscess of scapular region - IR was consulted and patient is status post ultrasound guided drainage of abscess. - Will follow-up with culture results for now antibiotics per ID recommendations. Per ID: Continue ancef Check TEE Cr stable BCx ngtd (9-26)  Discussed with cardiology for TEE. This will be done Wednesday per my discussion with them.   Prostatic abscess - consulted Urology and recommended the following: Irrigated Foley catheter at this time. if he begins to have voiding complaints or mild retention of urine, he can be started on Flomax 0.4 mg daily. No Urological intervention recommended. Dr. Sampson Goon who recommended IV Ancef every 8 hours until October 29  Active Problems:   Essential hypertension - Relatively well controlled on lisinopril and metoprolol    Dyslipidemia associated with type 2 diabetes mellitus (HCC) -  Pt is on Statin, no chest pain reported. stable    Type II diabetes with long term use of insulin (HCC) - Pt is on carb modified diet and lantus as well as SSI    MSSA (methicillin susceptible Staphylococcus aureus) septicemia (HCC) - Pt on vancomycin    Acute bilateral back pain - secondary to abscess - continue supportive therapy. Pt is s/p procedure listed above and below    Constipation, chronic - stable on dulcolax    Bilateral nephrolithiasis  Hypokalemia - replace orally and reassess  DVT prophylaxis: SCD's Code Status: Full Family Communication: d/c mother and sister.  Disposition Plan: Pending recommendations from specialist   Consultants:    Radiology  General surgery  Urology  Infectious disease   Procedures: US guided drainage of left posterior back/shoulder abscess  Antimicrobials: Zosyn, Vancomycin  Subjective: No acute issues reported overnight.   Objective: Vitals:   08/08/17 2138 08/09/17 0455 08/09/17 1121 08/09/17 1416  BP: 105/70 105/67 118/75 110/76  Pulse: 65 (!) 57 63 (!) 59  Resp: Temp: 98.2 F (36.8 C) 99.1 F (37.3 C) 98.8 F (37.1 C) 98.2 F (36.8 C)  TempSrc: Oral Oral Oral Oral  SpO2: 100% 99% 100% 100%  Weight:      Height:        Intake/Output Summary (Last 24 hours) at 08/09/17 1418 Last data filed at 08/09/17 1400  Gross per 24 hour  Intake              985 ml  Output             1250 ml  Net             -265 ml   Filed Weights   08/04/17 0956 08/04/17 1757  Weight: 68 kg (150 lb) 76.2 kg (168 lb)    Examination:Exam unchanged when compared to 08/08/2017  General exam: Appears calm and comfortable, in nad.   Respiratory system: Clear to auscultation. Respiratory effort normal. Equal chest rise. Cardiovascular system: S1 & S2 heard, RRR. No JVD, murmurs, rubs, gallops or clicks Gastrointestinal system: Abdomen is nondistended, soft and nontender. No organomegaly or masses felt. Normal bowel sounds heard. Central nervous system: Alert and oriented. No focal neurological deficits.  Extremities: Symmetric 5 x 5 power. Skin: No rashes or ulcers, warm and dry, pt has drain in place with serosanguinous fluid in bag Psychiatry:  Mood & affect appropriate.     Data Reviewed: I have personally reviewed following labs and imaging studies  CBC:  Recent Labs Lab 08/04/17 1145 08/04/17 1206 08/05/17 0459  WBC 6.4  --  5.4  NEUTROABS 4.1  --   --   HGB 10.4* 12.6* 9.7*  HCT 29.7* 37.0* 28.5*  MCV 88.1  --  88.2  PLT 103*  --  106*   Basic Metabolic Panel:  Recent Labs Lab 08/05/17 0459 08/06/17 0411 08/07/17 0326 08/08/17 0346 08/09/17 0454  NA  132* 132* 134* 135 134*  K 3.4* 3.5 3.1* 3.4* 3.1*  CL 102 101 104 102 103  CO2 GLUCOSE 109* 181* 137* 121* 171*  BUN <5* <5* 5* 7 6  CREATININE 0.56* 0.68 0.56* 0.62 0.60*  CALCIUM 7.5* 7.6* 7.4* 7.6* 7.3*   GFR: Estimated Creatinine Clearance: 104.3 mL/min (A) (by C-G formula based on SCr of 0.6 mg/dL (L)). Liver Function Tests:  Recent Labs Lab 08/04/17 1145 08/05/17 0459  AST 92* 85*  ALT 36 34  ALKPHOS 106 105  BILITOT 1.9* 1.8*  PROT 9.0* 8.0  ALBUMIN 1.5* 1.3*   No results for input(s): LIPASE, AMYLASE in the last 168 hours. No results for input(s): AMMONIA in the last 168 hours. Coagulation Profile:  Recent Labs Lab 08/05/17 0459  INR 1.32   Cardiac Enzymes: No results for input(s): CKTOTAL, CKMB, CKMBINDEX, TROPONINI in the last 168 hours. BNP (last 3 results) No results for input(s): PROBNP in the last 8760 hours. HbA1C: No results for input(s): HGBA1C in the last 72 hours. CBG:  Recent Labs Lab 08/08/17 1150 08/08/17 1732 08/08/17 2136 08/09/17 0758 08/09/17 1206  GLUCAP 163* 163* 212* 111* 129*   Lipid Profile: No results for input(s): CHOL, HDL, LDLCALC, TRIG, CHOLHDL, LDLDIRECT in the last 72 hours. Thyroid Function Tests: No results for input(s): TSH, T4TOTAL, FREET4, T3FREE, THYROIDAB in the last 72 hours. Anemia Panel: No results for input(s): VITAMINB12, FOLATE, FERRITIN, TIBC, IRON, RETICCTPCT in the last 72 hours. Sepsis Labs:  Recent Labs Lab 08/04/17 1206 08/04/17 1616  LATICACIDVEN 2.47* 2.65*    Recent Results (from the past 240 hour(s))  Blood culture (routine x 2)     Status: None   Collection Time: 08/04/17 11:45 AM  Result Value Ref Range Status   Specimen Description BLOOD LEFT ANTECUBITAL  Final   Special Requests   Final    BOTTLES DRAWN AEROBIC AND ANAEROBIC Blood Culture adequate volume   Culture NO GROWTH 5 DAYS  Final   Report Status 08/09/2017 FINAL  Final  Blood culture (routine x 2)      Status: None   Collection Time: 08/04/17 11:52 AM  Result Value Ref Range Status   Specimen Description BLOOD LEFT WRIST  Final   Special Requests   Final    BOTTLES DRAWN AEROBIC AND ANAEROBIC Blood Culture adequate volume   Culture NO GROWTH 5 DAYS  Final   Report Status 08/09/2017 FINAL  Final  Urine culture     Status: Abnormal   Collection Time: 08/04/17  5:07 PM  Result Value Ref Range Status   Specimen Description URINE, CLEAN CATCH  Final   Special Requests NONE  Final   Culture >=100,000 COLONIES/mL YEAST (A)  Final   Report Status 08/05/2017 FINAL  Final  MRSA PCR Screening     Status: None   Collection Time: 08/04/17  6:56 PM  Result Value Ref Range Status   MRSA by PCR NEGATIVE NEGATIVE Final    Comment:        The GeneXpert MRSA Assay (FDA approved for NASAL specimens only), is one component of a comprehensive MRSA colonization surveillance program. It is not intended to diagnose MRSA infection nor to guide or monitor treatment for MRSA infections.   Aerobic/Anaerobic Culture (surgical/deep wound)     Status: None (Preliminary result)   Collection Time: 08/05/17  4:28 PM  Result Value Ref Range Status   Specimen Description ABSCESS BACK  Final   Special Requests NONE  Final   Gram Stain   Final    ABUNDANT WBC PRESENT, PREDOMINANTLY PMN FEW GRAM POSITIVE COCCI IN CLUSTERS    Culture   Final    FEW STAPHYLOCOCCUS AUREUS NO ANAEROBES ISOLATED; CULTURE IN PROGRESS FOR 5 DAYS    Report Status PENDING  Incomplete   Organism ID, Bacteria STAPHYLOCOCCUS AUREUS  Final      Susceptibility   Staphylococcus aureus - MIC*    CIPROFLOXACIN <=0.5 SENSITIVE Sensitive     ERYTHROMYCIN <=0.25 SENSITIVE Sensitive     GENTAMICIN <=0.5 SENSITIVE Sensitive     OXACILLIN <=0.25 SENSITIVE Sensitive     TETRACYCLINE <=1 SENSITIVE Sensitive     VANCOMYCIN <=0.5 SENSITIVE Sensitive     TRIMETH/SULFA <=10 SENSITIVE Sensitive     CLINDAMYCIN <=0.25 SENSITIVE Sensitive      RIFAMPIN <=0.5 SENSITIVE Sensitive     Inducible Clindamycin NEGATIVE Sensitive     * FEW STAPHYLOCOCCUS AUREUS         Radiology Studies: No results found.      Scheduled Meds: . docusate sodium  100 mg Oral BID  . feeding supplement (PRO-STAT SUGAR FREE 64)  30 mL Oral BID  . insulin aspart  0-9 Units Subcutaneous TID WC  . insulin glargine  14 Units Subcutaneous QHS  . lidocaine-EPINEPHrine  20 mL Intradermal Once  . lisinopril  5 mg Oral Daily  . metoprolol tartrate  12.5 mg Oral BID  . multivitamin with minerals  1 tablet Oral Daily  . simvastatin  40 mg Oral Daily  . sodium chloride flush  5 mL Intravenous Q8H   Continuous Infusions: .  ceFAZolin (ANCEF) IV Stopped (08/09/17 1410)     LOS: 5 days    Time spent: > 35 minutes  Penny Pia, MD [facility] Pager [phone]  If 7PM-7AM, please contact night-coverage [email or web address] Password [email or web address] 08/09/2017, 2:18 PM

## 2017-08-11 ENCOUNTER — Ambulatory Visit: Payer: Self-pay | Admitting: Urology

## 2017-08-11 ENCOUNTER — Inpatient Hospital Stay (HOSPITAL_COMMUNITY): Payer: Self-pay

## 2017-08-11 ENCOUNTER — Encounter (HOSPITAL_COMMUNITY): Payer: Self-pay | Admitting: Cardiology

## 2017-08-11 ENCOUNTER — Inpatient Hospital Stay (HOSPITAL_BASED_OUTPATIENT_CLINIC_OR_DEPARTMENT_OTHER): Payer: Self-pay

## 2017-08-11 DIAGNOSIS — A4101 Sepsis due to Methicillin susceptible Staphylococcus aureus: Secondary | ICD-10-CM

## 2017-08-11 DIAGNOSIS — M7989 Other specified soft tissue disorders: Secondary | ICD-10-CM

## 2017-08-11 DIAGNOSIS — L02212 Cutaneous abscess of back [any part, except buttock]: Secondary | ICD-10-CM

## 2017-08-11 DIAGNOSIS — I10 Essential (primary) hypertension: Secondary | ICD-10-CM

## 2017-08-11 DIAGNOSIS — L02213 Cutaneous abscess of chest wall: Principal | ICD-10-CM

## 2017-08-11 DIAGNOSIS — R600 Localized edema: Secondary | ICD-10-CM

## 2017-08-11 LAB — BASIC METABOLIC PANEL
Anion gap: 5 (ref 5–15)
BUN: 5 mg/dL — AB (ref 6–20)
CHLORIDE: 102 mmol/L (ref 101–111)
CO2: 28 mmol/L (ref 22–32)
Calcium: 7.5 mg/dL — ABNORMAL LOW (ref 8.9–10.3)
Creatinine, Ser: 0.7 mg/dL (ref 0.61–1.24)
GFR calc Af Amer: >60 mL/min (ref >60)
GFR calc non Af Amer: >60 mL/min (ref >60)
GLUCOSE: 228 mg/dL — AB (ref 65–99)
POTASSIUM: 3.3 mmol/L — AB (ref 3.5–5.1)
Sodium: 135 mmol/L (ref 135–145)

## 2017-08-11 LAB — GLUCOSE, CAPILLARY
GLUCOSE-CAPILLARY: 208 mg/dL — AB (ref 65–99)
Glucose-Capillary: 120 mg/dL — ABNORMAL HIGH (ref 65–99)
Glucose-Capillary: 208 mg/dL — ABNORMAL HIGH (ref 65–99)

## 2017-08-11 MED ORDER — POTASSIUM CHLORIDE CRYS ER 20 MEQ PO TBCR
40.0000 meq | EXTENDED_RELEASE_TABLET | ORAL | Status: AC
  Administered 2017-08-11 (×2): 40 meq via ORAL
  Filled 2017-08-11 (×2): qty 2

## 2017-08-11 MED ORDER — FUROSEMIDE 20 MG PO TABS
20.0000 mg | ORAL_TABLET | Freq: Every day | ORAL | Status: DC
  Administered 2017-08-12 – 2017-08-13 (×2): 20 mg via ORAL
  Filled 2017-08-11 (×2): qty 1

## 2017-08-11 MED ORDER — FUROSEMIDE 10 MG/ML IJ SOLN
40.0000 mg | Freq: Once | INTRAMUSCULAR | Status: AC
  Administered 2017-08-11: 40 mg via INTRAVENOUS
  Filled 2017-08-11: qty 4

## 2017-08-11 MED ORDER — IOPAMIDOL (ISOVUE-300) INJECTION 61%
INTRAVENOUS | Status: AC
  Administered 2017-08-11: 100 mL
  Filled 2017-08-11: qty 100

## 2017-08-11 MED ORDER — IOPAMIDOL (ISOVUE-300) INJECTION 61%
INTRAVENOUS | Status: AC
  Filled 2017-08-11: qty 30

## 2017-08-11 NOTE — Evaluation (Signed)
Occupational Therapy Evaluation Patient Details Name: Cameron Coffey MRN: [id number] DOB: Feb 28, 1961 Today's Date: 08/11/2017    History of Present Illness Pt is a 56 y/o M who presented with fever and chills and a burning sensation when urinating.  Pt was found to have a UTI and an elevated lactic acid level.  Blood sugar was also elevated and pt was dehydrated.  Code sepsis was called.  Pt with prostate abscess, pyelonephritis and pt is going for cystoscopy by urology and possible draining of abscess. noted that pt has R middle lobe nodule with possible metastasis, pending CT of the chest following urology procedure.  pt s/p drainage of fluid under L scapula 9/27.   Clinical Impression   Pt progressing towards established OT goals. Pt performed simulated tub transfer with Min Guard A. Continues to recommend dc with HHOT to optimize safety and independence. Will continues to follow acutely.     Follow Up Recommendations  Home health OT    Equipment Recommendations  3 in 1 bedside commode    Recommendations for Other Services PT consult     Precautions / Restrictions Precautions Precautions:  (minimal fall risk) Restrictions Weight Bearing Restrictions: No      Mobility Bed Mobility Overal bed mobility: Modified Independent Bed Mobility: Supine to Sit       Sit to supine: Modified independent (Device/Increase time)   General bed mobility comments: slow, but independent  Transfers Overall transfer level: Modified independent Equipment used: None Transfers: Sit to/from Stand Sit to Stand: Modified independent (Device/Increase time)         General transfer comment: moving slow, but safe with no assist needed    Balance Overall balance assessment: Needs assistance Sitting-balance support: No upper extremity supported;Feet supported Sitting balance-Leahy Scale: Good Sitting balance - Comments: reports increasing discomfort the long he is in siting position. Improves  with standing and walking.    Standing balance support: Bilateral upper extremity supported;During functional activity Standing balance-Leahy Scale: Good Standing balance comment: Pt able to stand statically without UE support but relies on RW for dynamic activities. Pt requesting to use rw..  Pt able to carry a cup of coffee in one hand, cruising furniture otheerwise                           ADL either performed or assessed with clinical judgement   ADL Overall ADL's : Needs assistance/impaired                         Toilet Transfer: Radiographer, therapeutic Details (indicate cue type and reason): supervision to stand and use urinal     Tub/ Shower Transfer: Tub transfer;Ambulation;Rolling walker;Min guard;Cueing for safety Tub/Shower Transfer Details (indicate cue type and reason): Pt requiring cues for safety but was able to perform transfer with [employer] for safety.  Functional mobility during ADLs: Min guard;Rolling walker General ADL Comments: Pt performing ADLs and functional mobility at [employer] level. Pt with decreased activity toelrance and stating "you wore me out" at end of session with minimal activity.     Vision         Perception     Praxis      Pertinent Vitals/Pain Pain Assessment: Faces Faces Pain Scale: Hurts little more Pain Location: feet Pain Descriptors / Indicators: Sore Pain Intervention(s): Monitored during session;Limited activity within patient's tolerance;Repositioned     Hand Dominance     Extremity/Trunk  Assessment             Communication     Cognition Arousal/Alertness: Awake/alert Behavior During Therapy: Impulsive Overall Cognitive Status: Within Functional Limits for tasks assessed Area of Impairment: Orientation                               General Comments: Pt requiring cues for safety and demosntrating increased impulsivity. Pt with poor awarness.   General  Comments  Pt girlfriend present at begining of session    Exercises     Shoulder Instructions      Home Living                                          Prior Functioning/Environment                   OT Problem List:        OT Treatment/Interventions:      OT Goals(Current goals can be found in the care plan section) Acute Rehab OT Goals Patient Stated Goal: not stated OT Goal Formulation: With patient Time For Goal Achievement: 08/19/17 Potential to Achieve Goals: Good ADL Goals Pt Will Perform Grooming: with modified independence;standing;sitting Pt Will Perform Lower Body Bathing: with modified independence;sit to/from stand Pt Will Perform Upper Body Dressing: with modified independence;sitting Pt Will Perform Lower Body Dressing: with modified independence;sit to/from stand Pt Will Transfer to Toilet: with modified independence;ambulating Pt Will Perform Toileting - Clothing Manipulation and hygiene: with modified independence;sit to/from stand Pt Will Perform Tub/Shower Transfer: Tub transfer;with supervision;ambulating;rolling walker;3 in 1 Additional ADL Goal #1: Pt will complete bed mobility at min guard level prior to OOB ADLs.   OT Frequency: Min 2X/week   Barriers to D/C:            Co-evaluation              AM-PAC PT "6 Clicks" Daily Activity     Outcome Measure Help from another person eating meals?: None Help from another person taking care of personal grooming?: None Help from another person toileting, which includes using toliet, bedpan, or urinal?: A Little Help from another person bathing (including washing, rinsing, drying)?: A Little Help from another person to put on and taking off regular upper body clothing?: A Little Help from another person to put on and taking off regular lower body clothing?: A Little 6 Click Score: 20   End of Session Equipment Utilized During Treatment: Rolling walker Nurse Communication:  Mobility status  Activity Tolerance: Patient tolerated treatment well Patient left: in bed;with call bell/phone within reach;with bed alarm set  OT Visit Diagnosis: Unsteadiness on feet (R26.81);Muscle weakness (generalized) (M62.81);Pain                Time: 1610-9604 OT Time Calculation (min): 17 min Charges:  OT General Charges $OT Visit: 1 Visit OT Treatments $Self Care/Home Management : 8-22 mins G-Codes:     Vergil Burby MSOT, OTR/L Acute Rehab Pager: [phone] Office: [phone]  Theodoro Grist Travis Mastel 08/11/2017, 2:47 PM

## 2017-08-11 NOTE — Progress Notes (Signed)
Patient ID: Cameron Coffey, male   DOB: 11-Mar-1961, 56 y.o.   MRN: [id number]    Referring Physician(s): Dr. Fanny Skates  Supervising Physician: Aletta Edouard  Patient Status: [facility] - In-pt  Chief Complaint: Chest wall abscess  Subjective: Patient is doing well with no complaints.  Just got back from TEE.  Allergies: Patient has no known allergies.  Medications: Prior to Admission medications   Medication Sig Start Date End Date Taking? Authorizing Provider  acetaminophen (TYLENOL) 325 MG tablet Take 325 mg by mouth every 6 (six) hours as needed for mild pain.   Yes [provider]  glipiZIDE (GLUCOTROL) 10 MG tablet Take 1 tablet (10 mg total) by mouth 2 (two) times daily before a meal. 07/15/17  Yes Merlyn Lot, MD  lisinopril (PRINIVIL,ZESTRIL) 5 MG tablet Take 1 tablet (5 mg total) by mouth daily. 01/27/17  Yes Jearld Fenton, NP  metFORMIN (GLUCOPHAGE) 1000 MG tablet Take 1 tablet (1,000 mg total) by mouth 2 (two) times daily. 07/15/17  Yes Merlyn Lot, MD  simvastatin (ZOCOR) 40 MG tablet Take 1 tablet (40 mg total) by mouth daily. 01/27/17  Yes Baity, Coralie Keens, NP  ceFAZolin (ANCEF) IVPB Inject 1 g into the vein every 8 (eight) hours. Indication:  MSSA bacteremia Last Day of Therapy: october 29th Labs - Once weekly:  CBC/D and BMP, Labs - Every other week:  ESR and CRP 07/29/17   Epifanio Lesches, MD  insulin aspart (NOVOLOG FLEXPEN) 100 UNIT/ML FlexPen Inject 5 units subcutaneously before meals and and bedtime for DM CBG > or = 150.  Notify MD for CBG < 70 or > 500    [provider]  insulin glargine (LANTUS) 100 UNIT/ML injection Inject 0.14 mLs (14 Units total) into the skin at bedtime. 07/29/17   Epifanio Lesches, MD  metoprolol tartrate (LOPRESSOR) 25 MG tablet Take 0.5 tablets (12.5 mg total) by mouth 2 (two) times daily. 07/29/17   Epifanio Lesches, MD  oxyCODONE (ROXICODONE) 5 MG immediate release tablet Take 1 tablet (5 mg total)  by mouth every 4 (four) hours as needed for severe pain. 08/02/17   Gerlene Fee, NP    Vital Signs: BP 112/67   Pulse 64   Temp 98.2 F (36.8 C) (Oral)   Resp 17   Ht _0  (1.753 m)   Wt 168 lb (76.2 kg)   SpO2 100%   BMI 24.81 kg/m   Physical Exam: Chest: left chest drain still with at least 25cc of cloudy serosanguineous type output.  Site is c/d/i  Imaging: Ct Chest W Contrast  Result Date: 08/10/2017 CLINICAL DATA:  Followup chest wall fluid collection. EXAM: CT CHEST WITH CONTRAST TECHNIQUE: Multidetector CT imaging of the chest was performed during intravenous contrast administration. CONTRAST:  15m ISOVUE-300 IOPAMIDOL (ISOVUE-300) INJECTION 61% COMPARISON:  Chest CT 08/04/2017 FINDINGS: Cardiovascular: The heart is normal in size. No pericardial effusion. The aorta is normal in caliber. No dissection. The scattered atherosclerotic calcifications. Aberrant right subclavian artery again demonstrated. The pulmonary arteries are grossly normal. Mediastinum/Nodes: No mediastinal or hilar mass or adenopathy. Small scattered lymph nodes are stable. The esophagus is grossly normal. Lungs/Pleura: Persistent small left pleural effusion with minimal overlying left basilar atelectasis. No edema, focal infiltrates or worrisome pulmonary lesions. Upper Abdomen: Advanced cirrhotic changes involving the liver with portal venous hypertension, portal venous collaterals, ascites and mild splenomegaly. No obvious liver lesions. Cholelithiasis is noted. Musculoskeletal: Left-sided chest wall drainage catheter is still in place. I do not  see any residual fluid collection/abscess. The right-sided rim enhancing fluid collection/abscess measures 2.3 cm and previously measured 4.0 cm. No new abscesses are identified. IMPRESSION: 1. Resolution of left chest wall abscess. 2. Interval decrease in size of the right chest wall abscess. 3. Small persistent left pleural effusion with overlying atelectasis. 4.  Stable advanced cirrhotic changes and the sequela of such. Aortic Atherosclerosis (ICD10-I70.0). Electronically Signed   By: Marijo Sanes M.D.   On: 08/10/2017 16:48    Labs:  CBC:  Recent Labs  07/25/17 0308 07/27/17 1300 08/04/17 1145 08/04/17 1206 08/05/17 0459  WBC 15.4* 8.1 6.4  --  5.4  HGB 14.5 12.4* 10.4* 12.6* 9.7*  HCT 42.4 36.3* 29.7* 37.0* 28.5*  PLT 153 136* 103*  --  106*    COAGS:  Recent Labs  07/24/17 1223 07/26/17 0153 08/05/17 0459  INR 1.41  --  1.32  APTT 31 58*  --     BMP:  Recent Labs  08/08/17 0346 08/09/17 0454 08/10/17 0403 08/11/17 0432  NA 135 134* 134* 135  K 3.4* 3.1* 3.3* 3.3*  CL 102 103 104 102  CO2 _0 GLUCOSE 121* 171* 109* 228*  BUN _1 5*  CALCIUM 7.6* 7.3* 7.5* 7.5*  CREATININE 0.62 0.60* 0.60* 0.70  GFRNONAA >60 >60 >60 >60  GFRAA >60 >60 >60 >60    LIVER FUNCTION TESTS:  Recent Labs  08/14/16 1411 07/15/17 1315 07/31/17 08/04/17 1145 08/05/17 0459  BILITOT 1.1 2.1*  --  1.9* 1.8*  AST 162* 179* 130* 92* 85*  ALT 130* 126* 60* 36 34  ALKPHOS 245* 137* 183* 106 105  PROT 8.1 8.5*  --  9.0* 8.0  ALBUMIN 3.0* 2.8*  --  1.5* 1.3*    Assessment and Plan: 1. Left chest wall abscess, s/p perc drain Repeat CT scan shows that his collection has resolved however, he still has a decent amount of cloudy output.  D/W Dr. Donne Hazel today and he would like to continue this drain for now, in spite of CT results. 2. Right chest wall abscess This collection in smaller on recent scan, but given his MSSA bacteremia, an attempt at aspiration is requested.  This has been approved and we will plan to do this tomorrow.  He will not require sedation for this so he does not need to be NPO. Risks and benefits discussed with the patient including bleeding, infection, damage to adjacent structures. All of the patient's questions were answered, patient is agreeable to proceed. Consent signed and in  chart.   Electronically Signed: Henreitta Cea 08/11/2017, 11:16 AM   I spent a total of 15 Minutes at the the patient's bedside AND on the patient's hospital floor or unit, greater than 50% of which was counseling/coordinating care for right and left chest wall abscesses

## 2017-08-11 NOTE — Care Management Note (Signed)
Case Management Note  Patient Details  Name: Cameron Coffey MRN: [id number] Date of Birth: 06-07-1961  Subjective/Objective:          Presented with subscapular abscess/ MSSA bacteremia.   Action/Plan: When medically stable pt is to d/c back to SNF([facility]). CSW managing disposition to SNF. CM will continue to monitor.  Expected Discharge Date:                  Expected Discharge Plan:  Skilled Nursing Facility  In-House Referral:  Clinical Social Work  Discharge planning Services  CM Consult  Post Acute Care Choice:    Choice offered to:     DME Arranged:    DME Agency:     HH Arranged:    HH Agency:     Status of Service:  In process, will continue to follow  If discussed at Long Length of Stay Meetings, dates discussed:    Additional Comments:  Epifanio Lesches, RN 08/11/2017, 5:39 PM

## 2017-08-11 NOTE — Progress Notes (Signed)
PROGRESS NOTE [facility]   Cameron Coffey   ZOX:[id number] DOB: 10-Sep-1961  DOA: 08/04/2017 PCP: Lorre Munroe, NP   Brief Narrative:  Cameron Coffey a 56 year old male with medical history significant for hypertension, hyperlipidemia, chronic kidney disease, COPD, CAD presented to the emergency department complaining of left suprascapular swelling and pain. He was found to have on scapular abscess which was drained by IR. Found to have MSSA bacteremia ID was consulted. Also with prostatic abscess, urology consulted. Patient on IV abx. TEE was performed with no vegetations.   Subjective: Patient seen and examined, doing well, only complaint is b/l leg swelling. Chest wall draining well. No chest pain. TEE done yesterday no vegetations. Afebrile, no acute events overnight.   Assessment & Plan: Abscess of scapular region/ MSSA bacteremia Status post ultrasound-guided drainage- drain in place IR recommendations appreciated ID recommending 6 weeks of IV antibiotic CT of the chest was repeated showing improvement of chest wall abscess, although scapular abscess with some collection, surgical team recommending aspiration. TEE shows no vegetations  Prostatic abscess s/p TURP Urology consulted no urological intervention at this moment, recommending irrigate Foley, get voiding trials. Patient on long-term antibiotics Repeat CT of the abdomen pending  HTN  Stable  Continue current regimen   Type II DM - stable  Continue SSI and monitor CBG's   Bilateral LE edema  Unclear etiology - fluid overload from aggressive hydration vs lymphedema from infectious process  IV lasix given - start low dose lasix  Check albumin  Ted hose  No signs of DVT    Hypokalemia  Replete  Monitor in AM   DVT prophylaxis: Ted hose  Code Status: Full code  Family Communication: None at bedside  Disposition Plan: SNF in the next 24-48 hrs   Consultants:   ID  Surgery   IR   Procedures:     ECHO  TEE  Perc drain of chest wall abscess   Antimicrobials: Anti-infectives    Start     Dose/Rate Route Frequency Ordered Stop   08/06/17 1700  ceFAZolin (ANCEF) IVPB 2g/100 mL premix     2 g 200 mL/hr over 30 Minutes Intravenous Every 8 hours 08/06/17 1547     08/05/17 0200  vancomycin (VANCOCIN) IVPB 750 mg/150 ml premix  Status:  Discontinued     750 mg 150 mL/hr over 60 Minutes Intravenous Every 8 hours 08/04/17 1549 08/06/17 1530   08/04/17 2200  piperacillin-tazobactam (ZOSYN) IVPB 3.375 g  Status:  Discontinued     3.375 g 12.5 mL/hr over 240 Minutes Intravenous Every 8 hours 08/04/17 1549 08/06/17 1530   08/04/17 1445  vancomycin (VANCOCIN) 1,250 mg in sodium chloride 0.9 % 250 mL IVPB     1,250 mg 166.7 mL/hr over 90 Minutes Intravenous  Once 08/04/17 1434 08/04/17 1835   08/04/17 1445  piperacillin-tazobactam (ZOSYN) IVPB 3.375 g     3.375 g 100 mL/hr over 30 Minutes Intravenous  Once 08/04/17 1434 08/04/17 1627          Objective: Vitals:   08/10/17 2052 08/11/17 0532 08/11/17 1050 08/11/17 1050  BP: 103/66 109/73 112/67 112/67  Pulse: 74 69 64 64  Resp: 18 17    Temp: 99.5 F (37.5 C) 98.2 F (36.8 C)    TempSrc: Oral Oral    SpO2: 100% 100%    Weight:      Height:        Intake/Output Summary (Last 24 hours) at 08/11/17 1720 Last data filed  at 08/11/17 1632  Gross per 24 hour  Intake              415 ml  Output              255 ml  Net              160 ml   Filed Weights   08/04/17 0956 08/04/17 1757  Weight: 68 kg (150 lb) 76.2 kg (168 lb)    Examination:  General exam: Appears calm and comfortable  HEENT: AC/AT, PERRLA, OP moist and clear Respiratory system: Clear to auscultation. No wheezes,crackle or rhonchi Cardiovascular system: S1 & S2 heard, RRR. + systolic murmurs. No rubs or gallops Gastrointestinal system: Abdomen is nondistended, soft and nontender. Normal bowel sounds heard. Central nervous system: Alert and oriented.  No focal neurological deficits. Extremities: b/l LE pitting 2 +, non tenderness. Pulses present  Skin: No rashes, lesions or ulcers Psychiatry: Mood & affect appropriate.    Data Reviewed: I have personally reviewed following labs and imaging studies  CBC:  Recent Labs Lab 08/05/17 0459  WBC 5.4  HGB 9.7*  HCT 28.5*  MCV 88.2  PLT 106*   Basic Metabolic Panel:  Recent Labs Lab 08/07/17 0326 08/08/17 0346 08/09/17 0454 08/10/17 0403 08/11/17 0432  NA 134* 135 134* 134* 135  K 3.1* 3.4* 3.1* 3.3* 3.3*  CL 104 102 103 104 102  CO2 GLUCOSE 137* 121* 171* 109* 228*  BUN 5* 5*  CREATININE 0.56* 0.62 0.60* 0.60* 0.70  CALCIUM 7.4* 7.6* 7.3* 7.5* 7.5*   GFR: Estimated Creatinine Clearance: 104.3 mL/min (by C-G formula based on SCr of 0.7 mg/dL). Liver Function Tests:  Recent Labs Lab 08/05/17 0459  AST 85*  ALT 34  ALKPHOS 105  BILITOT 1.8*  PROT 8.0  ALBUMIN 1.3*   No results for input(s): LIPASE, AMYLASE in the last 168 hours. No results for input(s): AMMONIA in the last 168 hours. Coagulation Profile:  Recent Labs Lab 08/05/17 0459  INR 1.32   Cardiac Enzymes: No results for input(s): CKTOTAL, CKMB, CKMBINDEX, TROPONINI in the last 168 hours. BNP (last 3 results) No results for input(s): PROBNP in the last 8760 hours. HbA1C: No results for input(s): HGBA1C in the last 72 hours. CBG:  Recent Labs Lab 08/10/17 1147 08/10/17 1710 08/10/17 2059 08/11/17 0748 08/11/17 1210  GLUCAP 170* 188* 174* 120* 208*   Lipid Profile: No results for input(s): CHOL, HDL, LDLCALC, TRIG, CHOLHDL, LDLDIRECT in the last 72 hours. Thyroid Function Tests: No results for input(s): TSH, T4TOTAL, FREET4, T3FREE, THYROIDAB in the last 72 hours. Anemia Panel: No results for input(s): VITAMINB12, FOLATE, FERRITIN, TIBC, IRON, RETICCTPCT in the last 72 hours. Sepsis Labs: No results for input(s): PROCALCITON, LATICACIDVEN in the last 168  hours.  Recent Results (from the past 240 hour(s))  Blood culture (routine x 2)     Status: None   Collection Time: 08/04/17 11:45 AM  Result Value Ref Range Status   Specimen Description BLOOD LEFT ANTECUBITAL  Final   Special Requests   Final    BOTTLES DRAWN AEROBIC AND ANAEROBIC Blood Culture adequate volume   Culture NO GROWTH 5 DAYS  Final   Report Status 08/09/2017 FINAL  Final  Blood culture (routine x 2)     Status: None   Collection Time: 08/04/17 11:52 AM  Result Value Ref Range Status   Specimen Description BLOOD LEFT WRIST  Final  Special Requests   Final    BOTTLES DRAWN AEROBIC AND ANAEROBIC Blood Culture adequate volume   Culture NO GROWTH 5 DAYS  Final   Report Status 08/09/2017 FINAL  Final  Urine culture     Status: Abnormal   Collection Time: 08/04/17  5:07 PM  Result Value Ref Range Status   Specimen Description URINE, CLEAN CATCH  Final   Special Requests NONE  Final   Culture >=100,000 COLONIES/mL YEAST (A)  Final   Report Status 08/05/2017 FINAL  Final  MRSA PCR Screening     Status: None   Collection Time: 08/04/17  6:56 PM  Result Value Ref Range Status   MRSA by PCR NEGATIVE NEGATIVE Final    Comment:        The GeneXpert MRSA Assay (FDA approved for NASAL specimens only), is one component of a comprehensive MRSA colonization surveillance program. It is not intended to diagnose MRSA infection nor to guide or monitor treatment for MRSA infections.   Aerobic/Anaerobic Culture (surgical/deep wound)     Status: None   Collection Time: 08/05/17  4:28 PM  Result Value Ref Range Status   Specimen Description ABSCESS BACK  Final   Special Requests NONE  Final   Gram Stain   Final    ABUNDANT WBC PRESENT, PREDOMINANTLY PMN FEW GRAM POSITIVE COCCI IN CLUSTERS    Culture FEW STAPHYLOCOCCUS AUREUS NO ANAEROBES ISOLATED   Final   Report Status 08/10/2017 FINAL  Final   Organism ID, Bacteria STAPHYLOCOCCUS AUREUS  Final      Susceptibility    Staphylococcus aureus - MIC*    CIPROFLOXACIN <=0.5 SENSITIVE Sensitive     ERYTHROMYCIN <=0.25 SENSITIVE Sensitive     GENTAMICIN <=0.5 SENSITIVE Sensitive     OXACILLIN <=0.25 SENSITIVE Sensitive     TETRACYCLINE <=1 SENSITIVE Sensitive     VANCOMYCIN <=0.5 SENSITIVE Sensitive     TRIMETH/SULFA <=10 SENSITIVE Sensitive     CLINDAMYCIN <=0.25 SENSITIVE Sensitive     RIFAMPIN <=0.5 SENSITIVE Sensitive     Inducible Clindamycin NEGATIVE Sensitive     * FEW STAPHYLOCOCCUS AUREUS      Radiology Studies: Ct Chest W Contrast  Result Date: 08/10/2017 CLINICAL DATA:  Followup chest wall fluid collection. EXAM: CT CHEST WITH CONTRAST TECHNIQUE: Multidetector CT imaging of the chest was performed during intravenous contrast administration. CONTRAST:  75mL ISOVUE-300 IOPAMIDOL (ISOVUE-300) INJECTION 61% COMPARISON:  Chest CT 08/04/2017 FINDINGS: Cardiovascular: The heart is normal in size. No pericardial effusion. The aorta is normal in caliber. No dissection. The scattered atherosclerotic calcifications. Aberrant right subclavian artery again demonstrated. The pulmonary arteries are grossly normal. Mediastinum/Nodes: No mediastinal or hilar mass or adenopathy. Small scattered lymph nodes are stable. The esophagus is grossly normal. Lungs/Pleura: Persistent small left pleural effusion with minimal overlying left basilar atelectasis. No edema, focal infiltrates or worrisome pulmonary lesions. Upper Abdomen: Advanced cirrhotic changes involving the liver with portal venous hypertension, portal venous collaterals, ascites and mild splenomegaly. No obvious liver lesions. Cholelithiasis is noted. Musculoskeletal: Left-sided chest wall drainage catheter is still in place. I do not see any residual fluid collection/abscess. The right-sided rim enhancing fluid collection/abscess measures 2.3 cm and previously measured 4.0 cm. No new abscesses are identified. IMPRESSION: 1. Resolution of left chest wall abscess. 2.  Interval decrease in size of the right chest wall abscess. 3. Small persistent left pleural effusion with overlying atelectasis. 4. Stable advanced cirrhotic changes and the sequela of such. Aortic Atherosclerosis (ICD10-I70.0). Electronically Signed   By:  Rudie Meyer M.D.   On: 08/10/2017 16:48      Scheduled Meds: . docusate sodium  100 mg Oral BID  . feeding supplement (GLUCERNA SHAKE)  237 mL Oral BID BM  . [START ON 08/12/2017] furosemide  20 mg Oral Daily  . insulin aspart  0-9 Units Subcutaneous TID WC  . insulin glargine  14 Units Subcutaneous QHS  . iopamidol      . lidocaine-EPINEPHrine  20 mL Intradermal Once  . lisinopril  5 mg Oral Daily  . metoprolol tartrate  12.5 mg Oral BID  . multivitamin with minerals  1 tablet Oral Daily  . potassium chloride  40 mEq Oral Q4H  . simvastatin  40 mg Oral Daily  . sodium chloride flush  5 mL Intravenous Q8H   Continuous Infusions: .  ceFAZolin (ANCEF) IV Stopped (08/11/17 1252)     LOS: 7 days    Time spent: Total of 25 minutes spent with pt, greater than 50% of which was spent in discussion of  treatment, counseling and coordination of care    Latrelle Dodrill, MD Pager: Text Page via [email or web address]   If 7PM-7AM, please contact night-coverage [email or web address] 08/11/2017, 5:20 PM

## 2017-08-11 NOTE — Progress Notes (Signed)
Subjective No acute events. TEE yesterday showed no vegetations. Feeling well. Denies complaints   Objective: Vital signs in last 24 hours: Temp:  [98.2 F (36.8 C)-99.5 F (37.5 C)] 98.2 F (36.8 C) (10/03 0532) Pulse Rate:  [59-76] 69 (10/03 0532) Resp:  [15-21] 17 (10/03 0532) BP: (96-155)/(63-78) 109/73 (10/03 0532) SpO2:  [97 %-100 %] 100 % (10/03 0532) Last BM Date: 08/10/17  Intake/Output from previous day: 10/02 0701 - 10/03 0700 In: 210 [IV Piggyback:200] Out: 1075 [Urine:1050; Drains:25] Intake/Output this shift: No intake/output data recorded.  Gen: NAD, comfortable CV: RRR Pulm: Normal work of breathing; no palpable chest wall masses/fluctuance Abd: Soft, NT/ND Ext: SCDs in place  Lab Results: CBC  No results for input(s): WBC, HGB, HCT, PLT in the last 72 hours. BMET  Recent Labs  08/10/17 0403 08/11/17 0432  NA 134* 135  K 3.3* 3.3*  CL 104 102  CO2 27 28  GLUCOSE 109* 228*  BUN 7 5*  CREATININE 0.60* 0.70  CALCIUM 7.5* 7.5*   Anti-infectives: Anti-infectives    Start     Dose/Rate Route Frequency Ordered Stop   08/06/17 1700  ceFAZolin (ANCEF) IVPB 2g/100 mL premix     2 g 200 mL/hr over 30 Minutes Intravenous Every 8 hours 08/06/17 1547     08/05/17 0200  vancomycin (VANCOCIN) IVPB 750 mg/150 ml premix  Status:  Discontinued     750 mg 150 mL/hr over 60 Minutes Intravenous Every 8 hours 08/04/17 1549 08/06/17 1530   08/04/17 2200  piperacillin-tazobactam (ZOSYN) IVPB 3.375 g  Status:  Discontinued     3.375 g 12.5 mL/hr over 240 Minutes Intravenous Every 8 hours 08/04/17 1549 08/06/17 1530   08/04/17 1445  vancomycin (VANCOCIN) 1,250 mg in sodium chloride 0.9 % 250 mL IVPB     1,250 mg 166.7 mL/hr over 90 Minutes Intravenous  Once 08/04/17 1434 08/04/17 1835   08/04/17 1445  piperacillin-tazobactam (ZOSYN) IVPB 3.375 g     3.375 g 100 mL/hr over 30 Minutes Intravenous  Once 08/04/17 1434 08/04/17 1627      Assessment/Plan: Prostatic  abscess s/p TURP 9/17 now with chest wall abscesses - 1 has been drained by IR - subscapular on the L; other is on the right and ~2x3cm in size, thick rim enhancement - decreased in size on abx  -Cont IV abx -IR called for possible aspiration today -ID Following - planning 6wks ancef; TEE no vegetations; CT abd/pelvis today to look at prostatic bed -No planned surgical intervention at this juncture  LOS: 7 days   Andria Meuse, MD [facility]

## 2017-08-11 NOTE — Progress Notes (Signed)
Subjective:  In good spirits eating breakfast when I examined him  Antibiotics:  Anti-infectives    Start     Dose/Rate Route Frequency Ordered Stop   08/06/17 1700  ceFAZolin (ANCEF) IVPB 2g/100 mL premix     2 g 200 mL/hr over 30 Minutes Intravenous Every 8 hours 08/06/17 1547     08/05/17 0200  vancomycin (VANCOCIN) IVPB 750 mg/150 ml premix  Status:  Discontinued     750 mg 150 mL/hr over 60 Minutes Intravenous Every 8 hours 08/04/17 1549 08/06/17 1530   08/04/17 2200  piperacillin-tazobactam (ZOSYN) IVPB 3.375 g  Status:  Discontinued     3.375 g 12.5 mL/hr over 240 Minutes Intravenous Every 8 hours 08/04/17 1549 08/06/17 1530   08/04/17 1445  vancomycin (VANCOCIN) 1,250 mg in sodium chloride 0.9 % 250 mL IVPB     1,250 mg 166.7 mL/hr over 90 Minutes Intravenous  Once 08/04/17 1434 08/04/17 1835   08/04/17 1445  piperacillin-tazobactam (ZOSYN) IVPB 3.375 g     3.375 g 100 mL/hr over 30 Minutes Intravenous  Once 08/04/17 1434 08/04/17 1627      Medications: Scheduled Meds: . docusate sodium  100 mg Oral BID  . feeding supplement (GLUCERNA SHAKE)  237 mL Oral BID BM  . [START ON 08/12/2017] furosemide  20 mg Oral Daily  . insulin aspart  0-9 Units Subcutaneous TID WC  . insulin glargine  14 Units Subcutaneous QHS  . lidocaine-EPINEPHrine  20 mL Intradermal Once  . lisinopril  5 mg Oral Daily  . metoprolol tartrate  12.5 mg Oral BID  . multivitamin with minerals  1 tablet Oral Daily  . simvastatin  40 mg Oral Daily  . sodium chloride flush  5 mL Intravenous Q8H   Continuous Infusions: .  ceFAZolin (ANCEF) IV Stopped (08/11/17 1252)   PRN Meds:.acetaminophen **OR** acetaminophen, bisacodyl, HYDROcodone-acetaminophen, ondansetron **OR** ondansetron (ZOFRAN) IV, senna-docusate, sodium chloride flush, zolpidem    Objective: Weight change:   Intake/Output Summary (Last 24 hours) at 08/11/17 2012 Last data filed at 08/11/17 1632  Gross per 24 hour  Intake               410 ml  Output              230 ml  Net              180 ml   Blood pressure 112/67, pulse 64, temperature 98.2 F (36.8 C), temperature source Oral, resp. rate 17, height 5' 9"  (1.753 m), weight 168 lb (76.2 kg), SpO2 100 %. Temp:  [98.2 F (36.8 C)-99.5 F (37.5 C)] 98.2 F (36.8 C) (10/03 0532) Pulse Rate:  [64-74] 64 (10/03 1050) Resp:  [17-18] 17 (10/03 0532) BP: (103-112)/(66-73) 112/67 (10/03 1050) SpO2:  [100 %] 100 % (10/03 0532)  Physical Exam: General: Alert and awake, oriented x3,  HEENT: anicteric sclera, , EOMI CVS regular rate, normal r,  no murmur rubs or gallops Chest:diminised BS left side but otherwiseclear to auscultation bilaterally, no wheezing, rales or rhonchi Abdomen: softnondistended, normal bowel sounds, Extremities: no  clubbing or edema noted bilaterally Skin: no rashes, tube with purulent material Neuro: nonfocal  CBC: CBC Latest Ref Rng & Units 08/05/2017 08/04/2017 08/04/2017  WBC 4.0 - 10.5 K/uL 5.4 - 6.4  Hemoglobin 13.0 - 17.0 g/dL 9.7(L) 12.6(L) 10.4(L)  Hematocrit 39.0 - 52.0 % 28.5(L) 37.0(L) 29.7(L)  Platelets 150 - 400 K/uL 106(L) - 103(L)  BMET  Recent Labs  08/10/17 0403 08/11/17 0432  NA 134* 135  K 3.3* 3.3*  CL 104 102  CO2 27 28  GLUCOSE 109* 228*  BUN 7 5*  CREATININE 0.60* 0.70  CALCIUM 7.5* 7.5*     Liver Panel  No results for input(s): PROT, ALBUMIN, AST, ALT, ALKPHOS, BILITOT, BILIDIR, IBILI in the last 72 hours.     Sedimentation Rate No results for input(s): ESRSEDRATE in the last 72 hours. C-Reactive Protein No results for input(s): CRP in the last 72 hours.  Micro Results: Recent Results (from the past 720 hour(s))  Blood culture (routine x 2)     Status: Abnormal   Collection Time: 07/24/17  4:24 AM  Result Value Ref Range Status   Specimen Description BLOOD LEFT HAND  Final   Special Requests   Final    BOTTLES DRAWN AEROBIC AND ANAEROBIC Blood Culture adequate volume    Culture  Setup Time   Final    GRAM POSITIVE COCCI IN BOTH AEROBIC AND ANAEROBIC BOTTLES CRITICAL RESULT CALLED TO, READ BACK BY AND VERIFIED WITH: [facility] HALLAJI AT 1938 07/24/17.PMH    Culture (A)  Final    STAPHYLOCOCCUS AUREUS SUSCEPTIBILITIES PERFORMED ON PREVIOUS CULTURE WITHIN THE LAST 5 DAYS. Performed at [facility], [place] [address], [place], [place] [address]    Report Status 07/27/2017 FINAL  Final  Blood culture (routine x 2)     Status: Abnormal   Collection Time: 07/24/17  4:24 AM  Result Value Ref Range Status   Specimen Description BLOOD RIGHT HAND  Final   Special Requests   Final    BOTTLES DRAWN AEROBIC AND ANAEROBIC Blood Culture adequate volume   Culture  Setup Time   Final    GRAM POSITIVE COCCI IN BOTH AEROBIC AND ANAEROBIC BOTTLES CRITICAL RESULT CALLED TO, READ BACK BY AND VERIFIED WITH: [facility] HALLAJI AT 1938 07/24/17.PMH    Culture STAPHYLOCOCCUS AUREUS (A)  Final   Report Status 07/27/2017 FINAL  Final   Organism ID, Bacteria STAPHYLOCOCCUS AUREUS  Final      Susceptibility   Staphylococcus aureus - MIC*    CIPROFLOXACIN <=0.5 SENSITIVE Sensitive     ERYTHROMYCIN <=0.25 SENSITIVE Sensitive     GENTAMICIN <=0.5 SENSITIVE Sensitive     OXACILLIN 0.5 SENSITIVE Sensitive     TETRACYCLINE <=1 SENSITIVE Sensitive     VANCOMYCIN <=0.5 SENSITIVE Sensitive     TRIMETH/SULFA <=10 SENSITIVE Sensitive     CLINDAMYCIN <=0.25 SENSITIVE Sensitive     RIFAMPIN <=0.5 SENSITIVE Sensitive     Inducible Clindamycin NEGATIVE Sensitive     * STAPHYLOCOCCUS AUREUS  Blood Culture ID Panel (Reflexed)     Status: Abnormal   Collection Time: 07/24/17  4:24 AM  Result Value Ref Range Status   Enterococcus species NOT DETECTED NOT DETECTED Final   Listeria monocytogenes NOT DETECTED NOT DETECTED Final   Staphylococcus species DETECTED (A) NOT DETECTED Final    Comment: CRITICAL RESULT CALLED TO, READ BACK BY AND VERIFIED WITH: SHEEMA HALLAJI AT 1938 07/24/17.PMH     Staphylococcus aureus DETECTED (A) NOT DETECTED Final    Comment: Methicillin (oxacillin) susceptible Staphylococcus aureus (MSSA). Preferred therapy is anti staphylococcal beta lactam antibiotic (Cefazolin or Nafcillin), unless clinically contraindicated. CRITICAL RESULT CALLED TO, READ BACK BY AND VERIFIED WITH: [facility] HALLAJI AT 1938 07/24/17.PMH    Methicillin resistance NOT DETECTED NOT DETECTED Final   Streptococcus species NOT DETECTED NOT DETECTED Final   Streptococcus agalactiae NOT DETECTED NOT  DETECTED Final   Streptococcus pneumoniae NOT DETECTED NOT DETECTED Final   Streptococcus pyogenes NOT DETECTED NOT DETECTED Final   Acinetobacter baumannii NOT DETECTED NOT DETECTED Final   Enterobacteriaceae species NOT DETECTED NOT DETECTED Final   Enterobacter cloacae complex NOT DETECTED NOT DETECTED Final   Escherichia coli NOT DETECTED NOT DETECTED Final   Klebsiella oxytoca NOT DETECTED NOT DETECTED Final   Klebsiella pneumoniae NOT DETECTED NOT DETECTED Final   Proteus species NOT DETECTED NOT DETECTED Final   Serratia marcescens NOT DETECTED NOT DETECTED Final   Haemophilus influenzae NOT DETECTED NOT DETECTED Final   Neisseria meningitidis NOT DETECTED NOT DETECTED Final   Pseudomonas aeruginosa NOT DETECTED NOT DETECTED Final   Candida albicans NOT DETECTED NOT DETECTED Final   Candida glabrata NOT DETECTED NOT DETECTED Final   Candida krusei NOT DETECTED NOT DETECTED Final   Candida parapsilosis NOT DETECTED NOT DETECTED Final   Candida tropicalis NOT DETECTED NOT DETECTED Final  Urine Culture     Status: Abnormal   Collection Time: 07/25/17  3:34 AM  Result Value Ref Range Status   Specimen Description URINE, RANDOM  Final   Special Requests NONE  Final   Culture >=100,000 COLONIES/mL STAPHYLOCOCCUS AUREUS (A)  Final   Report Status 07/27/2017 FINAL  Final   Organism ID, Bacteria STAPHYLOCOCCUS AUREUS (A)  Final      Susceptibility   Staphylococcus aureus - MIC*     CIPROFLOXACIN <=0.5 SENSITIVE Sensitive     GENTAMICIN <=0.5 SENSITIVE Sensitive     NITROFURANTOIN <=16 SENSITIVE Sensitive     OXACILLIN <=0.25 SENSITIVE Sensitive     TETRACYCLINE <=1 SENSITIVE Sensitive     VANCOMYCIN 1 SENSITIVE Sensitive     TRIMETH/SULFA <=10 SENSITIVE Sensitive     CLINDAMYCIN <=0.25 SENSITIVE Sensitive     RIFAMPIN <=0.5 SENSITIVE Sensitive     Inducible Clindamycin NEGATIVE Sensitive     * >=100,000 COLONIES/mL STAPHYLOCOCCUS AUREUS  Culture, blood (Routine X 2) w Reflex to ID Panel     Status: Abnormal   Collection Time: 07/25/17  5:17 PM  Result Value Ref Range Status   Specimen Description BLOOD RIGHT ANTECUBITAL  Final   Special Requests   Final    BOTTLES DRAWN AEROBIC AND ANAEROBIC Blood Culture adequate volume   Culture  Setup Time   Final    GRAM POSITIVE COCCI IN BOTH AEROBIC AND ANAEROBIC BOTTLES CRITICAL RESULT CALLED TO, READ BACK BY AND VERIFIED WITH: KAREN HAYES AT 1444 ON 07/26/2017 JJB    Culture (A)  Final    STAPHYLOCOCCUS AUREUS SUSCEPTIBILITIES PERFORMED ON PREVIOUS CULTURE WITHIN THE LAST 5 DAYS. Performed at [facility], [place] [address] [place], [place] [address]    Report Status 07/29/2017 FINAL  Final  Culture, blood (Routine X 2) w Reflex to ID Panel     Status: None   Collection Time: 07/25/17  5:25 PM  Result Value Ref Range Status   Specimen Description BLOOD LEFT ANTECUBITAL  Final   Special Requests   Final    BOTTLES DRAWN AEROBIC AND ANAEROBIC Blood Culture results may not be optimal due to an excessive volume of blood received in culture bottles   Culture NO GROWTH 5 DAYS  Final   Report Status 07/30/2017 FINAL  Final  Culture, blood (single) w Reflex to ID Panel     Status: None   Collection Time: 07/26/17  4:54 PM  Result Value Ref Range Status   Specimen Description BLOOD LEFT ANTECUBITAL  Final   Special Requests   Final    BOTTLES DRAWN AEROBIC AND ANAEROBIC Blood Culture adequate volume   Culture NO  GROWTH 5 DAYS  Final   Report Status 07/31/2017 FINAL  Final  Blood culture (routine x 2)     Status: None   Collection Time: 08/04/17 11:45 AM  Result Value Ref Range Status   Specimen Description BLOOD LEFT ANTECUBITAL  Final   Special Requests   Final    BOTTLES DRAWN AEROBIC AND ANAEROBIC Blood Culture adequate volume   Culture NO GROWTH 5 DAYS  Final   Report Status 08/09/2017 FINAL  Final  Blood culture (routine x 2)     Status: None   Collection Time: 08/04/17 11:52 AM  Result Value Ref Range Status   Specimen Description BLOOD LEFT WRIST  Final   Special Requests   Final    BOTTLES DRAWN AEROBIC AND ANAEROBIC Blood Culture adequate volume   Culture NO GROWTH 5 DAYS  Final   Report Status 08/09/2017 FINAL  Final  Urine culture     Status: Abnormal   Collection Time: 08/04/17  5:07 PM  Result Value Ref Range Status   Specimen Description URINE, CLEAN CATCH  Final   Special Requests NONE  Final   Culture >=100,000 COLONIES/mL YEAST (A)  Final   Report Status 08/05/2017 FINAL  Final  MRSA PCR Screening     Status: None   Collection Time: 08/04/17  6:56 PM  Result Value Ref Range Status   MRSA by PCR NEGATIVE NEGATIVE Final    Comment:        The GeneXpert MRSA Assay (FDA approved for NASAL specimens only), is one component of a comprehensive MRSA colonization surveillance program. It is not intended to diagnose MRSA infection nor to guide or monitor treatment for MRSA infections.   Aerobic/Anaerobic Culture (surgical/deep wound)     Status: None   Collection Time: 08/05/17  4:28 PM  Result Value Ref Range Status   Specimen Description ABSCESS BACK  Final   Special Requests NONE  Final   Gram Stain   Final    ABUNDANT WBC PRESENT, PREDOMINANTLY PMN FEW GRAM POSITIVE COCCI IN CLUSTERS    Culture FEW STAPHYLOCOCCUS AUREUS NO ANAEROBES ISOLATED   Final   Report Status 08/10/2017 FINAL  Final   Organism ID, Bacteria STAPHYLOCOCCUS AUREUS  Final      Susceptibility    Staphylococcus aureus - MIC*    CIPROFLOXACIN <=0.5 SENSITIVE Sensitive     ERYTHROMYCIN <=0.25 SENSITIVE Sensitive     GENTAMICIN <=0.5 SENSITIVE Sensitive     OXACILLIN <=0.25 SENSITIVE Sensitive     TETRACYCLINE <=1 SENSITIVE Sensitive     VANCOMYCIN <=0.5 SENSITIVE Sensitive     TRIMETH/SULFA <=10 SENSITIVE Sensitive     CLINDAMYCIN <=0.25 SENSITIVE Sensitive     RIFAMPIN <=0.5 SENSITIVE Sensitive     Inducible Clindamycin NEGATIVE Sensitive     * FEW STAPHYLOCOCCUS AUREUS    Studies/Results: Ct Chest W Contrast  Result Date: 08/10/2017 CLINICAL DATA:  Followup chest wall fluid collection. EXAM: CT CHEST WITH CONTRAST TECHNIQUE: Multidetector CT imaging of the chest was performed during intravenous contrast administration. CONTRAST:  10m ISOVUE-300 IOPAMIDOL (ISOVUE-300) INJECTION 61% COMPARISON:  Chest CT 08/04/2017 FINDINGS: Cardiovascular: The heart is normal in size. No pericardial effusion. The aorta is normal in caliber. No dissection. The scattered atherosclerotic calcifications. Aberrant right subclavian artery again demonstrated. The pulmonary arteries are grossly normal. Mediastinum/Nodes: No mediastinal or hilar mass or adenopathy.  Small scattered lymph nodes are stable. The esophagus is grossly normal. Lungs/Pleura: Persistent small left pleural effusion with minimal overlying left basilar atelectasis. No edema, focal infiltrates or worrisome pulmonary lesions. Upper Abdomen: Advanced cirrhotic changes involving the liver with portal venous hypertension, portal venous collaterals, ascites and mild splenomegaly. No obvious liver lesions. Cholelithiasis is noted. Musculoskeletal: Left-sided chest wall drainage catheter is still in place. I do not see any residual fluid collection/abscess. The right-sided rim enhancing fluid collection/abscess measures 2.3 cm and previously measured 4.0 cm. No new abscesses are identified. IMPRESSION: 1. Resolution of left chest wall abscess. 2.  Interval decrease in size of the right chest wall abscess. 3. Small persistent left pleural effusion with overlying atelectasis. 4. Stable advanced cirrhotic changes and the sequela of such. Aortic Atherosclerosis (ICD10-I70.0). Electronically Signed   By: Marijo Sanes M.D.   On: 08/10/2017 16:48      Assessment/Plan:  INTERVAL HISTORY:    Pt undergoing CT abfdomen to have other chest wall abscess aspirated   Active Problems:   Essential hypertension   Dyslipidemia associated with type 2 diabetes mellitus (HCC)   Type II diabetes with long term use of insulin (HCC)   MSSA (methicillin susceptible Staphylococcus aureus) septicemia (HCC)   Mass on back   Acute bilateral back pain   Constipation, chronic   Bilateral nephrolithiasis   Pain in scapula   Abscess of scapular region   Pressure injury of skin   Staphylococcus aureus bacteremia with sepsis ([place])   Chest wall abscess   Soft tissue mass    Cameron Coffey is a 56 y.o. male with  MSSA bacteremia discovered when he had prostatic abscess (though he also had antecedent infection on face with boil) on appropriate antibiotics but now found to have MSSA "metastatic" infection with chest wall abscess sp IR drainage  #1 Chest wall abscess with MSSA;  He is going to need protracted IV antibotics for this process as well  I would treat him with another 6 weeks of IV abx with day #1 being 08/05/17 when abscess was drained  He will need to be serially imaged to ensure resolution of this process and ultimately may very well need a surgical intervention given virulence of the organism  He is to have opposite chest wall aspirated by IR and I appreciate this  #2 MSSA bacteremia with metastatic infection:  TEE negative  #3 Prostatic abscess sp TURP. Will get CT abdomen to ensure no pathology here  Diagnosis: MSSA bacteremia and chest wall abscesses  Culture Result: MSSA  No Known Allergies  OPAT Orders Discharge  antibiotics: Per pharmacy protocol  Cefazolin 2 g IV q 8 hours   Duration: 6 weeks  End Date: November 7th  [facility] Per Protocol:  Labs weekly while on IV antibiotics: _x_ CBC with differential x__ BMP _x_ CRP _x_ ESR   x__ Please pull PIC at completion of IV antibiotics __ Please leave PIC in place until doctor has seen patient or been notified  Fax weekly labs to [phone]  Clinic Follow Up Appt:  4 weeks      LOS: 7 days   Alcide Evener 08/11/2017, 8:12 PM

## 2017-08-12 ENCOUNTER — Inpatient Hospital Stay (HOSPITAL_COMMUNITY): Payer: Self-pay

## 2017-08-12 DIAGNOSIS — L0291 Cutaneous abscess, unspecified: Secondary | ICD-10-CM

## 2017-08-12 LAB — GLUCOSE, CAPILLARY
GLUCOSE-CAPILLARY: 185 mg/dL — AB (ref 65–99)
Glucose-Capillary: 209 mg/dL — ABNORMAL HIGH (ref 65–99)
Glucose-Capillary: 167 mg/dL — ABNORMAL HIGH (ref 65–99)
Glucose-Capillary: 135 mg/dL — ABNORMAL HIGH (ref 65–99)
Glucose-Capillary: 173 mg/dL — ABNORMAL HIGH (ref 65–99)

## 2017-08-12 MED ORDER — LIDOCAINE HCL 1 % IJ SOLN
INTRAMUSCULAR | Status: AC
  Filled 2017-08-12: qty 20

## 2017-08-12 MED ORDER — POTASSIUM CHLORIDE CRYS ER 20 MEQ PO TBCR
40.0000 meq | EXTENDED_RELEASE_TABLET | ORAL | Status: AC
  Administered 2017-08-12 (×2): 40 meq via ORAL
  Filled 2017-08-12 (×2): qty 2

## 2017-08-12 NOTE — Progress Notes (Signed)
[facility] Progress Note  2 Days Post-Op  Subjective: CC:  Pain controlled. Denies fever/chills. Tolerating PO. Having bowel function.   Objective: Vital signs in last 24 hours: Temp:  [98.4 F (36.9 C)-98.8 F (37.1 C)] 98.8 F (37.1 C) (10/04 0518) Pulse Rate:  [63-73] 64 (10/04 0935) Resp:  [20] 20 (10/04 0518) BP: (102-112)/(64-71) 106/71 (10/04 0935) SpO2:  [98 %-100 %] 100 % (10/04 0518) Last BM Date: 08/11/17  Intake/Output from previous day: 10/03 0701 - 10/04 0700 In: 310 [P.O.:300; I.V.:5] Out: 30 [Drains:30] Intake/Output this shift: Total I/O In: 220 [P.O.:220] Out: -   PE: Gen:  Alert, NAD, pleasant Card:  Regular rate and rhythm, pedal pulses 2+ BL Pulm:  Normal effort, clear to auscultation bilaterally with diminished breath sounds left base  Drain in place with cloudy brown fluid in drain. 30 cc/24h Abd: Soft, non-tender, non-distended, bowel sounds present  Skin: warm and dry, no rashes  Psych: A&Ox3   Lab Results:  No results for input(s): WBC, HGB, HCT, PLT in the last 72 hours. BMET  Recent Labs  08/10/17 0403 08/11/17 0432  NA 134* 135  K 3.3* 3.3*  CL 104 102  CO2 27 28  GLUCOSE 109* 228*  BUN 7 5*  CREATININE 0.60* 0.70  CALCIUM 7.5* 7.5*   PT/INR No results for input(s): LABPROT, INR in the last 72 hours. CMP     Component Value Date/Time   NA 135 08/11/2017 0432   NA 134 (A) 07/31/2017   K 3.3 (L) 08/11/2017 0432   CL 102 08/11/2017 0432   CO2 28 08/11/2017 0432   GLUCOSE 228 (H) 08/11/2017 0432   BUN 5 (L) 08/11/2017 0432   BUN 5 07/31/2017   CREATININE 0.70 08/11/2017 0432   CALCIUM 7.5 (L) 08/11/2017 0432   PROT 8.0 08/05/2017 0459   ALBUMIN 1.3 (L) 08/05/2017 0459   AST 85 (H) 08/05/2017 0459   ALT 34 08/05/2017 0459   ALKPHOS 105 08/05/2017 0459   BILITOT 1.8 (H) 08/05/2017 0459   GFRNONAA >60 08/11/2017 0432   GFRAA >60 08/11/2017 0432   Lipase  No results found for:  LIPASE     Studies/Results: Ct Chest W Contrast  Result Date: 08/10/2017 CLINICAL DATA:  Followup chest wall fluid collection. EXAM: CT CHEST WITH CONTRAST TECHNIQUE: Multidetector CT imaging of the chest was performed during intravenous contrast administration. CONTRAST:  75mL ISOVUE-300 IOPAMIDOL (ISOVUE-300) INJECTION 61% COMPARISON:  Chest CT 08/04/2017 FINDINGS: Cardiovascular: The heart is normal in size. No pericardial effusion. The aorta is normal in caliber. No dissection. The scattered atherosclerotic calcifications. Aberrant right subclavian artery again demonstrated. The pulmonary arteries are grossly normal. Mediastinum/Nodes: No mediastinal or hilar mass or adenopathy. Small scattered lymph nodes are stable. The esophagus is grossly normal. Lungs/Pleura: Persistent small left pleural effusion with minimal overlying left basilar atelectasis. No edema, focal infiltrates or worrisome pulmonary lesions. Upper Abdomen: Advanced cirrhotic changes involving the liver with portal venous hypertension, portal venous collaterals, ascites and mild splenomegaly. No obvious liver lesions. Cholelithiasis is noted. Musculoskeletal: Left-sided chest wall drainage catheter is still in place. I do not see any residual fluid collection/abscess. The right-sided rim enhancing fluid collection/abscess measures 2.3 cm and previously measured 4.0 cm. No new abscesses are identified. IMPRESSION: 1. Resolution of left chest wall abscess. 2. Interval decrease in size of the right chest wall abscess. 3. Small persistent left pleural effusion with overlying atelectasis. 4. Stable advanced cirrhotic changes and the sequela of such. Aortic Atherosclerosis (ICD10-I70.0).  Electronically Signed   By: Rudie Meyer M.D.   On: 08/10/2017 16:48    Anti-infectives: Anti-infectives    Start     Dose/Rate Route Frequency Ordered Stop   08/06/17 1700  ceFAZolin (ANCEF) IVPB 2g/100 mL premix     2 g 200 mL/hr over 30 Minutes  Intravenous Every 8 hours 08/06/17 1547     08/05/17 0200  vancomycin (VANCOCIN) IVPB 750 mg/150 ml premix  Status:  Discontinued     750 mg 150 mL/hr over 60 Minutes Intravenous Every 8 hours 08/04/17 1549 08/06/17 1530   08/04/17 2200  piperacillin-tazobactam (ZOSYN) IVPB 3.375 g  Status:  Discontinued     3.375 g 12.5 mL/hr over 240 Minutes Intravenous Every 8 hours 08/04/17 1549 08/06/17 1530   08/04/17 1445  vancomycin (VANCOCIN) 1,250 mg in sodium chloride 0.9 % 250 mL IVPB     1,250 mg 166.7 mL/hr over 90 Minutes Intravenous  Once 08/04/17 1434 08/04/17 1835   08/04/17 1445  piperacillin-tazobactam (ZOSYN) IVPB 3.375 g     3.375 g 100 mL/hr over 30 Minutes Intravenous  Once 08/04/17 1434 08/04/17 1627       Assessment/Plan Prostatic abscess s/p TURP 9/17  Left subscapular abscess- drain placed by IR 9/27 -Cont IV abx Right chest wall abscess - aspiration today -ID Following both abscesses- planning 6wks ancef; TEE no vegetations; CT abd/pelvis today to look at prostatic bed -No planned surgical intervention at this juncture  FEN - Carb Mod diet ID - Ancef 9/28 >> VTE - SCDs   LOS: 8 days    Adam Phenix , [facility] 08/12/2017, 10:30 AM Pager: [phone] Consults: [phone] Mon-Fri 7:00 am-4:30 pm Sat-Sun 7:00 am-11:30 am

## 2017-08-12 NOTE — Progress Notes (Signed)
Subjective:  Relayed history of father worrying about him being contagious  Antibiotics:  Anti-infectives    Start     Dose/Rate Route Frequency Ordered Stop   08/06/17 1700  ceFAZolin (ANCEF) IVPB 2g/100 mL premix     2 g 200 mL/hr over 30 Minutes Intravenous Every 8 hours 08/06/17 1547     08/05/17 0200  vancomycin (VANCOCIN) IVPB 750 mg/150 ml premix  Status:  Discontinued     750 mg 150 mL/hr over 60 Minutes Intravenous Every 8 hours 08/04/17 1549 08/06/17 1530   08/04/17 2200  piperacillin-tazobactam (ZOSYN) IVPB 3.375 g  Status:  Discontinued     3.375 g 12.5 mL/hr over 240 Minutes Intravenous Every 8 hours 08/04/17 1549 08/06/17 1530   08/04/17 1445  vancomycin (VANCOCIN) 1,250 mg in sodium chloride 0.9 % 250 mL IVPB     1,250 mg 166.7 mL/hr over 90 Minutes Intravenous  Once 08/04/17 1434 08/04/17 1835   08/04/17 1445  piperacillin-tazobactam (ZOSYN) IVPB 3.375 g     3.375 g 100 mL/hr over 30 Minutes Intravenous  Once 08/04/17 1434 08/04/17 1627      Medications: Scheduled Meds: . docusate sodium  100 mg Oral BID  . feeding supplement (GLUCERNA SHAKE)  237 mL Oral BID BM  . furosemide  20 mg Oral Daily  . insulin aspart  0-9 Units Subcutaneous TID WC  . insulin glargine  14 Units Subcutaneous QHS  . lidocaine-EPINEPHrine  20 mL Intradermal Once  . lisinopril  5 mg Oral Daily  . metoprolol tartrate  12.5 mg Oral BID  . multivitamin with minerals  1 tablet Oral Daily  . potassium chloride  40 mEq Oral Q4H  . simvastatin  40 mg Oral Daily  . sodium chloride flush  5 mL Intravenous Q8H   Continuous Infusions: .  ceFAZolin (ANCEF) IV Stopped (08/12/17 1357)   PRN Meds:.acetaminophen **OR** acetaminophen, bisacodyl, HYDROcodone-acetaminophen, ondansetron **OR** ondansetron (ZOFRAN) IV, senna-docusate, sodium chloride flush, zolpidem    Objective: Weight change:   Intake/Output Summary (Last 24 hours) at 08/12/17 1932 Last data filed at 08/12/17 0757  Gross per 24 hour  Intake              225 ml  Output                0 ml  Net              225 ml   Blood pressure 106/71, pulse 64, temperature 98.8 F (37.1 C), temperature source Oral, resp. rate 20, height 5' 9"  (1.753 m), weight 168 lb (76.2 kg), SpO2 100 %. Temp:  [98.4 F (36.9 C)-98.8 F (37.1 C)] 98.8 F (37.1 C) (10/04 0518) Pulse Rate:  [63-73] 64 (10/04 0935) Resp:  [20] 20 (10/04 0518) BP: (102-106)/(64-71) 106/71 (10/04 0935) SpO2:  [98 %-100 %] 100 % (10/04 0518)  Physical Exam: General: Alert and awake, oriented x3,  HEENT: anicteric sclera, , EOMI CVS regular rate, normal r,  no murmur rubs or gallops Chest:diminised BS left side but otherwiseclear to auscultation bilaterally, no wheezing, rales or rhonchi Abdomen: softnondistended, normal bowel sounds, Extremities: no  clubbing or edema noted bilaterally Skin: no rashes, tube with purulent material Neuro: nonfocal  CBC: CBC Latest Ref Rng & Units 08/05/2017 08/04/2017 08/04/2017  WBC 4.0 - 10.5 K/uL 5.4 - 6.4  Hemoglobin 13.0 - 17.0 g/dL 9.7(L) 12.6(L) 10.4(L)  Hematocrit 39.0 - 52.0 % 28.5(L) 37.0(L) 29.7(L)  Platelets 150 - 400  K/uL 106(L) - 103(L)      BMET  Recent Labs  08/10/17 0403 08/11/17 0432  NA 134* 135  K 3.3* 3.3*  CL 104 102  CO2 27 28  GLUCOSE 109* 228*  BUN 7 5*  CREATININE 0.60* 0.70  CALCIUM 7.5* 7.5*     Liver Panel  No results for input(s): PROT, ALBUMIN, AST, ALT, ALKPHOS, BILITOT, BILIDIR, IBILI in the last 72 hours.     Sedimentation Rate No results for input(s): ESRSEDRATE in the last 72 hours. C-Reactive Protein No results for input(s): CRP in the last 72 hours.  Micro Results: Recent Results (from the past 720 hour(s))  Blood culture (routine x 2)     Status: Abnormal   Collection Time: 07/24/17  4:24 AM  Result Value Ref Range Status   Specimen Description BLOOD LEFT HAND  Final   Special Requests   Final    BOTTLES DRAWN AEROBIC AND ANAEROBIC Blood  Culture adequate volume   Culture  Setup Time   Final    GRAM POSITIVE COCCI IN BOTH AEROBIC AND ANAEROBIC BOTTLES CRITICAL RESULT CALLED TO, READ BACK BY AND VERIFIED WITH: [facility] HALLAJI AT 1938 07/24/17.PMH    Culture (A)  Final    STAPHYLOCOCCUS AUREUS SUSCEPTIBILITIES PERFORMED ON PREVIOUS CULTURE WITHIN THE LAST 5 DAYS. Performed at [facility], [place] [address], [place], [place] [address]    Report Status 07/27/2017 FINAL  Final  Blood culture (routine x 2)     Status: Abnormal   Collection Time: 07/24/17  4:24 AM  Result Value Ref Range Status   Specimen Description BLOOD RIGHT HAND  Final   Special Requests   Final    BOTTLES DRAWN AEROBIC AND ANAEROBIC Blood Culture adequate volume   Culture  Setup Time   Final    GRAM POSITIVE COCCI IN BOTH AEROBIC AND ANAEROBIC BOTTLES CRITICAL RESULT CALLED TO, READ BACK BY AND VERIFIED WITH: [facility] HALLAJI AT 1938 07/24/17.PMH    Culture STAPHYLOCOCCUS AUREUS (A)  Final   Report Status 07/27/2017 FINAL  Final   Organism ID, Bacteria STAPHYLOCOCCUS AUREUS  Final      Susceptibility   Staphylococcus aureus - MIC*    CIPROFLOXACIN <=0.5 SENSITIVE Sensitive     ERYTHROMYCIN <=0.25 SENSITIVE Sensitive     GENTAMICIN <=0.5 SENSITIVE Sensitive     OXACILLIN 0.5 SENSITIVE Sensitive     TETRACYCLINE <=1 SENSITIVE Sensitive     VANCOMYCIN <=0.5 SENSITIVE Sensitive     TRIMETH/SULFA <=10 SENSITIVE Sensitive     CLINDAMYCIN <=0.25 SENSITIVE Sensitive     RIFAMPIN <=0.5 SENSITIVE Sensitive     Inducible Clindamycin NEGATIVE Sensitive     * STAPHYLOCOCCUS AUREUS  Blood Culture ID Panel (Reflexed)     Status: Abnormal   Collection Time: 07/24/17  4:24 AM  Result Value Ref Range Status   Enterococcus species NOT DETECTED NOT DETECTED Final   Listeria monocytogenes NOT DETECTED NOT DETECTED Final   Staphylococcus species DETECTED (A) NOT DETECTED Final    Comment: CRITICAL RESULT CALLED TO, READ BACK BY AND VERIFIED WITH: SHEEMA HALLAJI  AT 1938 07/24/17.PMH    Staphylococcus aureus DETECTED (A) NOT DETECTED Final    Comment: Methicillin (oxacillin) susceptible Staphylococcus aureus (MSSA). Preferred therapy is anti staphylococcal beta lactam antibiotic (Cefazolin or Nafcillin), unless clinically contraindicated. CRITICAL RESULT CALLED TO, READ BACK BY AND VERIFIED WITH: [facility] HALLAJI AT 1938 07/24/17.PMH    Methicillin resistance NOT DETECTED NOT DETECTED Final   Streptococcus species NOT DETECTED NOT  DETECTED Final   Streptococcus agalactiae NOT DETECTED NOT DETECTED Final   Streptococcus pneumoniae NOT DETECTED NOT DETECTED Final   Streptococcus pyogenes NOT DETECTED NOT DETECTED Final   Acinetobacter baumannii NOT DETECTED NOT DETECTED Final   Enterobacteriaceae species NOT DETECTED NOT DETECTED Final   Enterobacter cloacae complex NOT DETECTED NOT DETECTED Final   Escherichia coli NOT DETECTED NOT DETECTED Final   Klebsiella oxytoca NOT DETECTED NOT DETECTED Final   Klebsiella pneumoniae NOT DETECTED NOT DETECTED Final   Proteus species NOT DETECTED NOT DETECTED Final   Serratia marcescens NOT DETECTED NOT DETECTED Final   Haemophilus influenzae NOT DETECTED NOT DETECTED Final   Neisseria meningitidis NOT DETECTED NOT DETECTED Final   Pseudomonas aeruginosa NOT DETECTED NOT DETECTED Final   Candida albicans NOT DETECTED NOT DETECTED Final   Candida glabrata NOT DETECTED NOT DETECTED Final   Candida krusei NOT DETECTED NOT DETECTED Final   Candida parapsilosis NOT DETECTED NOT DETECTED Final   Candida tropicalis NOT DETECTED NOT DETECTED Final  Urine Culture     Status: Abnormal   Collection Time: 07/25/17  3:34 AM  Result Value Ref Range Status   Specimen Description URINE, RANDOM  Final   Special Requests NONE  Final   Culture >=100,000 COLONIES/mL STAPHYLOCOCCUS AUREUS (A)  Final   Report Status 07/27/2017 FINAL  Final   Organism ID, Bacteria STAPHYLOCOCCUS AUREUS (A)  Final      Susceptibility    Staphylococcus aureus - MIC*    CIPROFLOXACIN <=0.5 SENSITIVE Sensitive     GENTAMICIN <=0.5 SENSITIVE Sensitive     NITROFURANTOIN <=16 SENSITIVE Sensitive     OXACILLIN <=0.25 SENSITIVE Sensitive     TETRACYCLINE <=1 SENSITIVE Sensitive     VANCOMYCIN 1 SENSITIVE Sensitive     TRIMETH/SULFA <=10 SENSITIVE Sensitive     CLINDAMYCIN <=0.25 SENSITIVE Sensitive     RIFAMPIN <=0.5 SENSITIVE Sensitive     Inducible Clindamycin NEGATIVE Sensitive     * >=100,000 COLONIES/mL STAPHYLOCOCCUS AUREUS  Culture, blood (Routine X 2) w Reflex to ID Panel     Status: Abnormal   Collection Time: 07/25/17  5:17 PM  Result Value Ref Range Status   Specimen Description BLOOD RIGHT ANTECUBITAL  Final   Special Requests   Final    BOTTLES DRAWN AEROBIC AND ANAEROBIC Blood Culture adequate volume   Culture  Setup Time   Final    GRAM POSITIVE COCCI IN BOTH AEROBIC AND ANAEROBIC BOTTLES CRITICAL RESULT CALLED TO, READ BACK BY AND VERIFIED WITH: KAREN HAYES AT 1444 ON 07/26/2017 JJB    Culture (A)  Final    STAPHYLOCOCCUS AUREUS SUSCEPTIBILITIES PERFORMED ON PREVIOUS CULTURE WITHIN THE LAST 5 DAYS. Performed at [facility], [place] [address] [place], [place] [address]    Report Status 07/29/2017 FINAL  Final  Culture, blood (Routine X 2) w Reflex to ID Panel     Status: None   Collection Time: 07/25/17  5:25 PM  Result Value Ref Range Status   Specimen Description BLOOD LEFT ANTECUBITAL  Final   Special Requests   Final    BOTTLES DRAWN AEROBIC AND ANAEROBIC Blood Culture results may not be optimal due to an excessive volume of blood received in culture bottles   Culture NO GROWTH 5 DAYS  Final   Report Status 07/30/2017 FINAL  Final  Culture, blood (single) w Reflex to ID Panel     Status: None   Collection Time: 07/26/17  4:54 PM  Result Value Ref Range  Status   Specimen Description BLOOD LEFT ANTECUBITAL  Final   Special Requests   Final    BOTTLES DRAWN AEROBIC AND ANAEROBIC Blood Culture  adequate volume   Culture NO GROWTH 5 DAYS  Final   Report Status 07/31/2017 FINAL  Final  Blood culture (routine x 2)     Status: None   Collection Time: 08/04/17 11:45 AM  Result Value Ref Range Status   Specimen Description BLOOD LEFT ANTECUBITAL  Final   Special Requests   Final    BOTTLES DRAWN AEROBIC AND ANAEROBIC Blood Culture adequate volume   Culture NO GROWTH 5 DAYS  Final   Report Status 08/09/2017 FINAL  Final  Blood culture (routine x 2)     Status: None   Collection Time: 08/04/17 11:52 AM  Result Value Ref Range Status   Specimen Description BLOOD LEFT WRIST  Final   Special Requests   Final    BOTTLES DRAWN AEROBIC AND ANAEROBIC Blood Culture adequate volume   Culture NO GROWTH 5 DAYS  Final   Report Status 08/09/2017 FINAL  Final  Urine culture     Status: Abnormal   Collection Time: 08/04/17  5:07 PM  Result Value Ref Range Status   Specimen Description URINE, CLEAN CATCH  Final   Special Requests NONE  Final   Culture >=100,000 COLONIES/mL YEAST (A)  Final   Report Status 08/05/2017 FINAL  Final  MRSA PCR Screening     Status: None   Collection Time: 08/04/17  6:56 PM  Result Value Ref Range Status   MRSA by PCR NEGATIVE NEGATIVE Final    Comment:        The GeneXpert MRSA Assay (FDA approved for NASAL specimens only), is one component of a comprehensive MRSA colonization surveillance program. It is not intended to diagnose MRSA infection nor to guide or monitor treatment for MRSA infections.   Aerobic/Anaerobic Culture (surgical/deep wound)     Status: None   Collection Time: 08/05/17  4:28 PM  Result Value Ref Range Status   Specimen Description ABSCESS BACK  Final   Special Requests NONE  Final   Gram Stain   Final    ABUNDANT WBC PRESENT, PREDOMINANTLY PMN FEW GRAM POSITIVE COCCI IN CLUSTERS    Culture FEW STAPHYLOCOCCUS AUREUS NO ANAEROBES ISOLATED   Final   Report Status 08/10/2017 FINAL  Final   Organism ID, Bacteria STAPHYLOCOCCUS  AUREUS  Final      Susceptibility   Staphylococcus aureus - MIC*    CIPROFLOXACIN <=0.5 SENSITIVE Sensitive     ERYTHROMYCIN <=0.25 SENSITIVE Sensitive     GENTAMICIN <=0.5 SENSITIVE Sensitive     OXACILLIN <=0.25 SENSITIVE Sensitive     TETRACYCLINE <=1 SENSITIVE Sensitive     VANCOMYCIN <=0.5 SENSITIVE Sensitive     TRIMETH/SULFA <=10 SENSITIVE Sensitive     CLINDAMYCIN <=0.25 SENSITIVE Sensitive     RIFAMPIN <=0.5 SENSITIVE Sensitive     Inducible Clindamycin NEGATIVE Sensitive     * FEW STAPHYLOCOCCUS AUREUS    Studies/Results: Ct Abdomen Pelvis W Contrast  Result Date: 08/12/2017 CLINICAL DATA:  Fever. Abdominal pain. Inpatient. Percutaneous ultrasound-guided drainage of intramuscular left chest wall abscess on 08/05/2017. EXAM: CT ABDOMEN AND PELVIS WITH CONTRAST TECHNIQUE: Multidetector CT imaging of the abdomen and pelvis was performed using the standard protocol following bolus administration of intravenous contrast. CONTRAST:  100 cc Isovue-300 IV. COMPARISON:  07/25/2017 CT abdomen/ pelvis. FINDINGS: Lower chest: Stable small dependent left pleural effusion. Mild dependent left lower lobe  atelectasis, stable. No residual measurable fluid collection in the visualized left posterior chest wall at the site of the percutaneous drain. Hepatobiliary: Diffusely irregular liver surface with relative hypertrophy of the lateral segment left liver lobe, compatible with cirrhosis. Hypodense 0.6 cm segment 6 right liver lobe lesion (series 3/ image 23), too small to characterize, stable since 07/25/2017. No additional liver lesions. Cholelithiasis. Contracted gallbladder with no gallbladder wall thickening. No biliary ductal dilatation. Pancreas: Normal, with no mass or duct dilation. Spleen: Normal size. No mass. Adrenals/Urinary Tract: Normal adrenals. Clustered nonobstructing lower left renal stones measuring up to 9 mm. Nonobstructing 3 mm lower right renal stone. Subcentimeter hypodense renal  cortical lesion in the posterior interpolar left kidney, too small to characterize, stable since 07/25/2017, requiring no follow-up. No additional renal lesions. No hydronephrosis. Excreted contrast is seen in the nondistended bladder, which appears grossly normal. Stomach/Bowel: Grossly normal stomach. Normal caliber small bowel with no small bowel wall thickening. Normal appendix. Oral contrast progresses to the hepatic flexure of the colon. Relatively collapsed large bowel with no large bowel wall thickening or diverticulosis. Vascular/Lymphatic: Atherosclerotic nonaneurysmal abdominal aorta. Patent portal, splenic, hepatic and renal veins. Stable small paraumbilical and moderate perirectal varices. Stable enlarged 1.3 cm gastrohepatic ligament node (series 3/ image 19). Stable enlarged 1.2 cm porta hepatis node (series 3/ image 21). Stable enlarged 1.4 cm portacaval node (series 3/ image 23). No new pathologically enlarged abdominopelvic nodes. Reproductive: Interval decreased size of the prostate. Stable nonspecific internal prostatic calcifications. Heterogeneous prostate, with no residual measurable prostatic abscess. Other: No pneumoperitoneum. Moderate volume ascites, increased. Stable anasarca. No discrete fluid collections. Musculoskeletal: No aggressive appearing focal osseous lesions. IMPRESSION: 1. Heterogeneous prostate, significantly decreased in size in the interval. No residual measurable prostatic abscess. 2. No residual measurable abscess in the visualized left posterior chest wall at the site of the percutaneous drain. 3. No new focal abdominopelvic fluid collections. 4. Cirrhosis. Subcentimeter hypodense segment 6 right liver lobe lesion, stable since 07/25/2017. Recommend attention on follow-up MRI abdomen without and with IV contrast in 3-6 months. 5. Moderate volume ascites, increased. Stable small paraumbilical and moderate perirectal varices. 6. Stable mild upper retroperitoneal  adenopathy, nonspecific, probably reactive. 7. Stable small dependent left pleural effusion . 8. Cholelithiasis. No evidence of acute cholecystitis. No biliary ductal dilatation. 9. Nonobstructing bilateral nephrolithiasis. 10.  Aortic Atherosclerosis (ICD10-I70.0). Electronically Signed   By: Ilona Sorrel M.D.   On: 08/12/2017 17:03      Assessment/Plan:  INTERVAL HISTORY:    CT abdomen did not show residual pathology in prostate   Active Problems:   Essential hypertension   Dyslipidemia associated with type 2 diabetes mellitus (HCC)   Type II diabetes with long term use of insulin (HCC)   MSSA (methicillin susceptible Staphylococcus aureus) septicemia (HCC)   Mass on back   Acute bilateral back pain   Constipation, chronic   Bilateral nephrolithiasis   Pain in scapula   Abscess of scapular region   Pressure injury of skin   Staphylococcus aureus bacteremia with sepsis ([place])   Chest wall abscess   Soft tissue mass    Cameron Coffey is a 56 y.o. 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 Will get CT abdomen to ensure no pathology here  Diagnosis: MSSA bacteremia and chest wall abscesses  Culture Result: MSSA  No Known Allergies  OPAT  Orders Discharge antibiotics: Per pharmacy protocol  Cefazolin 2 g IV q 8 hours   Duration: 6 weeks  End Date: November 7th  [facility] Care Per Protocol:  Labs weekly while on IV antibiotics: _x_ CBC with differential x__ BMP _x_ CRP _x_ ESR   x__ Please pull PIC at completion of IV antibiotics __ Please leave PIC in place until doctor has seen patient or been notified  Fax weekly labs to [phone]  Clinic Follow Up Appt:  4 weeks  I will sign off for now  Please call with further questions.     LOS: 8 days   Alcide Evener 08/12/2017, 7:32 PM

## 2017-08-12 NOTE — Progress Notes (Signed)
PROGRESS NOTE [facility]   Cameron Coffey   WUJ:[id number] DOB: 09-Jun-1961  DOA: 08/04/2017 PCP: Lorre Munroe, NP   Brief Narrative:  Cameron Coffey a 56 year old male with medical history significant for hypertension, hyperlipidemia, chronic kidney disease, COPD, CAD presented to the emergency department complaining of left suprascapular swelling and pain. He was found to have on scapular abscess which was drained by IR. Found to have MSSA bacteremia ID was consulted. Also with prostatic abscess, urology consulted. Patient on IV abx. TEE was performed with no vegetations.   Subjective: Patient seen and examined, leg swelling decreasing. Feeling better, asking about oral abx.   Assessment & Plan: Chest wall abscess/ MSSA bacteremia Status post ultrasound-guided drainage- drain in place ID recommending 6 weeks of IV antibiotic For drainage of r side chest wall abscess under US guidance.  CT of the chest was repeated showing improvement of chest wall abscess, although scapular abscess with some collection, surgical team recommending aspiration. TEE shows no vegetations SNF in AM with 6 weeks of IV abx  Have extensive discussion regarding IV abx, patient agrees to continue treatment - discussed with social worker plan for SNF in AM if everything remains stable   Prostatic abscess s/p TURP Urology consulted no urological intervention at this moment, recommending irrigate Foley, get voiding trials. Patient on long-term antibiotics Repeat CT of the abdomen pending results - tech has not release exam. Discussed w/ radiology they will read it stat.   HTN  Stable  Continue current regimen   Type II DM - stable  Continue SSI and monitor CBG's   Bilateral LE edema - improved with lasix  Unclear etiology - fluid overload from aggressive hydration vs lymphedema from infectious process  IV lasix given - continue low dose Lasix for now  Check albumin  Elevate leg, apply ted hose    No signs of DVT    Hypokalemia  Replete  Check Mag and K in AM   DVT prophylaxis: Ted hose  Code Status: Full code  Family Communication: None at bedside  Disposition Plan: SNF in the next 24-48 hrs   Consultants:   ID  Surgery   IR   Procedures:   ECHO  TEE  Perc drain of chest wall abscess   Antimicrobials: Anti-infectives    Start     Dose/Rate Route Frequency Ordered Stop   08/06/17 1700  ceFAZolin (ANCEF) IVPB 2g/100 mL premix     2 g 200 mL/hr over 30 Minutes Intravenous Every 8 hours 08/06/17 1547     08/05/17 0200  vancomycin (VANCOCIN) IVPB 750 mg/150 ml premix  Status:  Discontinued     750 mg 150 mL/hr over 60 Minutes Intravenous Every 8 hours 08/04/17 1549 08/06/17 1530   08/04/17 2200  piperacillin-tazobactam (ZOSYN) IVPB 3.375 g  Status:  Discontinued     3.375 g 12.5 mL/hr over 240 Minutes Intravenous Every 8 hours 08/04/17 1549 08/06/17 1530   08/04/17 1445  vancomycin (VANCOCIN) 1,250 mg in sodium chloride 0.9 % 250 mL IVPB     1,250 mg 166.7 mL/hr over 90 Minutes Intravenous  Once 08/04/17 1434 08/04/17 1835   08/04/17 1445  piperacillin-tazobactam (ZOSYN) IVPB 3.375 g     3.375 g 100 mL/hr over 30 Minutes Intravenous  Once 08/04/17 1434 08/04/17 1627         Objective: Vitals:   08/11/17 1050 08/11/17 2144 08/12/17 0518 08/12/17 0935  BP: 112/67 103/70 102/64 106/71  Pulse: 64 73 63 64  Resp:  20 20   Temp:  98.4 F (36.9 C) 98.8 F (37.1 C)   TempSrc:  Oral Oral   SpO2:  98% 100%   Weight:      Height:        Intake/Output Summary (Last 24 hours) at 08/12/17 1601 Last data filed at 08/12/17 0757  Gross per 24 hour  Intake              230 ml  Output               30 ml  Net              200 ml   Filed Weights   08/04/17 0956 08/04/17 1757  Weight: 68 kg (150 lb) 76.2 kg (168 lb)    Examination: General: NAD  Cardiovascular: RRR, S1/S2 +, no rubs, no gallops Respiratory: CTA bilaterally, drain in place   Abdominal:  Soft, NT, ND, bowel sounds + Extremities: B/L LE edema improving 1+ pitting   Data Reviewed: I have personally reviewed following labs and imaging studies  CBC: No results for input(s): WBC, NEUTROABS, HGB, HCT, MCV, PLT in the last 168 hours. Basic Metabolic Panel:  Recent Labs Lab 08/07/17 0326 08/08/17 0346 08/09/17 0454 08/10/17 0403 08/11/17 0432  NA 134* 135 134* 134* 135  K 3.1* 3.4* 3.1* 3.3* 3.3*  CL 104 102 103 104 102  CO2 GLUCOSE 137* 121* 171* 109* 228*  BUN 5* 5*  CREATININE 0.56* 0.62 0.60* 0.60* 0.70  CALCIUM 7.4* 7.6* 7.3* 7.5* 7.5*   GFR: Estimated Creatinine Clearance: 104.3 mL/min (by C-G formula based on SCr of 0.7 mg/dL). Liver Function Tests: No results for input(s): AST, ALT, ALKPHOS, BILITOT, PROT, ALBUMIN in the last 168 hours. No results for input(s): LIPASE, AMYLASE in the last 168 hours. No results for input(s): AMMONIA in the last 168 hours. Coagulation Profile: No results for input(s): INR, PROTIME in the last 168 hours. Cardiac Enzymes: No results for input(s): CKTOTAL, CKMB, CKMBINDEX, TROPONINI in the last 168 hours. BNP (last 3 results) No results for input(s): PROBNP in the last 8760 hours. HbA1C: No results for input(s): HGBA1C in the last 72 hours. CBG:  Recent Labs Lab 08/11/17 1210 08/11/17 1725 08/11/17 2143 08/12/17 0746 08/12/17 1158  GLUCAP 208* 209* 208* 167* 135*   Lipid Profile: No results for input(s): CHOL, HDL, LDLCALC, TRIG, CHOLHDL, LDLDIRECT in the last 72 hours. Thyroid Function Tests: No results for input(s): TSH, T4TOTAL, FREET4, T3FREE, THYROIDAB in the last 72 hours. Anemia Panel: No results for input(s): VITAMINB12, FOLATE, FERRITIN, TIBC, IRON, RETICCTPCT in the last 72 hours. Sepsis Labs: No results for input(s): PROCALCITON, LATICACIDVEN in the last 168 hours.  Recent Results (from the past 240 hour(s))  Blood culture (routine x 2)     Status: None   Collection Time:  08/04/17 11:45 AM  Result Value Ref Range Status   Specimen Description BLOOD LEFT ANTECUBITAL  Final   Special Requests   Final    BOTTLES DRAWN AEROBIC AND ANAEROBIC Blood Culture adequate volume   Culture NO GROWTH 5 DAYS  Final   Report Status 08/09/2017 FINAL  Final  Blood culture (routine x 2)     Status: None   Collection Time: 08/04/17 11:52 AM  Result Value Ref Range Status   Specimen Description BLOOD LEFT WRIST  Final   Special Requests   Final    BOTTLES DRAWN AEROBIC AND  ANAEROBIC Blood Culture adequate volume   Culture NO GROWTH 5 DAYS  Final   Report Status 08/09/2017 FINAL  Final  Urine culture     Status: Abnormal   Collection Time: 08/04/17  5:07 PM  Result Value Ref Range Status   Specimen Description URINE, CLEAN CATCH  Final   Special Requests NONE  Final   Culture >=100,000 COLONIES/mL YEAST (A)  Final   Report Status 08/05/2017 FINAL  Final  MRSA PCR Screening     Status: None   Collection Time: 08/04/17  6:56 PM  Result Value Ref Range Status   MRSA by PCR NEGATIVE NEGATIVE Final    Comment:        The GeneXpert MRSA Assay (FDA approved for NASAL specimens only), is one component of a comprehensive MRSA colonization surveillance program. It is not intended to diagnose MRSA infection nor to guide or monitor treatment for MRSA infections.   Aerobic/Anaerobic Culture (surgical/deep wound)     Status: None   Collection Time: 08/05/17  4:28 PM  Result Value Ref Range Status   Specimen Description ABSCESS BACK  Final   Special Requests NONE  Final   Gram Stain   Final    ABUNDANT WBC PRESENT, PREDOMINANTLY PMN FEW GRAM POSITIVE COCCI IN CLUSTERS    Culture FEW STAPHYLOCOCCUS AUREUS NO ANAEROBES ISOLATED   Final   Report Status 08/10/2017 FINAL  Final   Organism ID, Bacteria STAPHYLOCOCCUS AUREUS  Final      Susceptibility   Staphylococcus aureus - MIC*    CIPROFLOXACIN <=0.5 SENSITIVE Sensitive     ERYTHROMYCIN <=0.25 SENSITIVE Sensitive      GENTAMICIN <=0.5 SENSITIVE Sensitive     OXACILLIN <=0.25 SENSITIVE Sensitive     TETRACYCLINE <=1 SENSITIVE Sensitive     VANCOMYCIN <=0.5 SENSITIVE Sensitive     TRIMETH/SULFA <=10 SENSITIVE Sensitive     CLINDAMYCIN <=0.25 SENSITIVE Sensitive     RIFAMPIN <=0.5 SENSITIVE Sensitive     Inducible Clindamycin NEGATIVE Sensitive     * FEW STAPHYLOCOCCUS AUREUS      Radiology Studies: Ct Chest W Contrast  Result Date: 08/10/2017 CLINICAL DATA:  Followup chest wall fluid collection. EXAM: CT CHEST WITH CONTRAST TECHNIQUE: Multidetector CT imaging of the chest was performed during intravenous contrast administration. CONTRAST:  75mL ISOVUE-300 IOPAMIDOL (ISOVUE-300) INJECTION 61% COMPARISON:  Chest CT 08/04/2017 FINDINGS: Cardiovascular: The heart is normal in size. No pericardial effusion. The aorta is normal in caliber. No dissection. The scattered atherosclerotic calcifications. Aberrant right subclavian artery again demonstrated. The pulmonary arteries are grossly normal. Mediastinum/Nodes: No mediastinal or hilar mass or adenopathy. Small scattered lymph nodes are stable. The esophagus is grossly normal. Lungs/Pleura: Persistent small left pleural effusion with minimal overlying left basilar atelectasis. No edema, focal infiltrates or worrisome pulmonary lesions. Upper Abdomen: Advanced cirrhotic changes involving the liver with portal venous hypertension, portal venous collaterals, ascites and mild splenomegaly. No obvious liver lesions. Cholelithiasis is noted. Musculoskeletal: Left-sided chest wall drainage catheter is still in place. I do not see any residual fluid collection/abscess. The right-sided rim enhancing fluid collection/abscess measures 2.3 cm and previously measured 4.0 cm. No new abscesses are identified. IMPRESSION: 1. Resolution of left chest wall abscess. 2. Interval decrease in size of the right chest wall abscess. 3. Small persistent left pleural effusion with overlying  atelectasis. 4. Stable advanced cirrhotic changes and the sequela of such. Aortic Atherosclerosis (ICD10-I70.0). Electronically Signed   By: Rudie Meyer M.D.   On: 08/10/2017 16:48  Scheduled Meds: . docusate sodium  100 mg Oral BID  . feeding supplement (GLUCERNA SHAKE)  237 mL Oral BID BM  . furosemide  20 mg Oral Daily  . insulin aspart  0-9 Units Subcutaneous TID WC  . insulin glargine  14 Units Subcutaneous QHS  . lidocaine-EPINEPHrine  20 mL Intradermal Once  . lisinopril  5 mg Oral Daily  . metoprolol tartrate  12.5 mg Oral BID  . multivitamin with minerals  1 tablet Oral Daily  . simvastatin  40 mg Oral Daily  . sodium chloride flush  5 mL Intravenous Q8H   Continuous Infusions: .  ceFAZolin (ANCEF) IV Stopped (08/12/17 1357)     LOS: 8 days    Time spent: Total of 25 minutes spent with pt, greater than 50% of which was spent in discussion of  treatment, counseling and coordination of care   Latrelle Dodrill, MD Pager: Text Page via [email or web address]   If 7PM-7AM, please contact night-coverage [email or web address] 08/12/2017, 4:01 PM

## 2017-08-13 DIAGNOSIS — E118 Type 2 diabetes mellitus with unspecified complications: Secondary | ICD-10-CM

## 2017-08-13 DIAGNOSIS — Z794 Long term (current) use of insulin: Secondary | ICD-10-CM

## 2017-08-13 LAB — BASIC METABOLIC PANEL
Anion gap: 4 — ABNORMAL LOW (ref 5–15)
BUN: 6 mg/dL (ref 6–20)
CALCIUM: 7.8 mg/dL — AB (ref 8.9–10.3)
CHLORIDE: 102 mmol/L (ref 101–111)
CO2: 28 mmol/L (ref 22–32)
CREATININE: 0.69 mg/dL (ref 0.61–1.24)
GFR calc non Af Amer: >60 mL/min (ref >60)
Glucose, Bld: 119 mg/dL — ABNORMAL HIGH (ref 65–99)
Potassium: 4 mmol/L (ref 3.5–5.1)
SODIUM: 134 mmol/L — AB (ref 135–145)

## 2017-08-13 LAB — GLUCOSE, CAPILLARY
GLUCOSE-CAPILLARY: 162 mg/dL — AB (ref 65–99)
GLUCOSE-CAPILLARY: 124 mg/dL — AB (ref 65–99)

## 2017-08-13 LAB — MAGNESIUM: MAGNESIUM: 1.6 mg/dL — AB (ref 1.7–2.4)

## 2017-08-13 LAB — ALBUMIN: ALBUMIN: 1.5 g/dL — AB (ref 3.5–5.0)

## 2017-08-13 MED ORDER — GLUCERNA SHAKE PO LIQD: 237.0000 mL | Freq: Two times a day (BID) | ORAL | 0 refills | Status: DC

## 2017-08-13 MED ORDER — HEPARIN SOD (PORK) LOCK FLUSH 100 UNIT/ML IV SOLN
250.0000 [IU] | INTRAVENOUS | Status: AC | PRN
  Administered 2017-08-13: 250 [IU]

## 2017-08-13 MED ORDER — CEFAZOLIN IV (FOR PTA / DISCHARGE USE ONLY): 2.0000 g | Freq: Three times a day (TID) | INTRAVENOUS | 0 refills | Status: AC

## 2017-08-13 MED ORDER — MAGNESIUM SULFATE IN D5W 1-5 GM/100ML-% IV SOLN
1.0000 g | Freq: Once | INTRAVENOUS | Status: DC
  Filled 2017-08-13: qty 100

## 2017-08-13 MED ORDER — FUROSEMIDE 20 MG PO TABS: 20.0000 mg | ORAL_TABLET | Freq: Every day | ORAL | Status: DC

## 2017-08-13 MED ORDER — MAGNESIUM SULFATE 2 GM/50ML IV SOLN
2.0000 g | Freq: Once | INTRAVENOUS | Status: DC
  Filled 2017-08-13: qty 50

## 2017-08-13 NOTE — Progress Notes (Signed)
Patient ID: Cameron Coffey, male   DOB: 06/13/1961, 56 y.o.   MRN: [id number]  CT yesterday 1. Heterogeneous prostate, significantly decreased in size in the interval. No residual measurable prostatic abscess. 2. No residual measurable abscess in the visualized left posterior chest wall at the site of the percutaneous drain. 3. No new focal abdominopelvic fluid collections. Afeb Minimal output for few days Wbc wnl  +staph aureus Ancef IV every 8 hrs  Remove drain per Dr Kelle Darting removed without complication

## 2017-08-13 NOTE — Discharge Summary (Signed)
Physician Discharge Summary  Cameron Coffey  UXN:[id number]  DOB: 1961/01/19  DOA: 08/04/2017 PCP: Jearld Fenton, NP  Admit date: 08/04/2017 Discharge date: 08/13/2017  Admitted From: Home  Disposition:  SNF   Recommendations for Outpatient Follow-up:  1. Follow up with PCP in 1 week 2. Please obtain CMP/CBC in one week to monitor renal and liver function  3. Please continue abx until September 16, 2017  4. Please repeat blood cultures after abx completion and pull PICC line out if blood cx negative x 48 hrs  5. Check weekly labs including ESR, CRP, BMP and CBC w/ diff while on abx  6. Follow up with urology in 3-4 weeks  7. CT show hypodense nodule R liver lobe - recommending follow up CT in 3-6 months   Discharge Condition: Stable   CODE STATUS: Full code  Diet recommendation: Heart Healthy   Brief/Interim Summary: Cameron Coffey a 56 year old male with medical history significant for hypertension, hyperlipidemia, chronic kidney disease, COPD, CAD presented to the emergency department complaining of left suprascapular swelling and pain. He was found to have on scapular/ chest wall abscess which was drained by IR. Found to have MSSA bacteremia ID was consulted. Also found prostatic abscess, urology consulted no surgical intervention needed. Patient on IV abx ID recommending 6 weeks total. TEE was performed with no vegetations. Repeat CT chest showed decrease in collection. CT pelvis also shows resolution of prostate abscess. Patient has clinically improved and deemed to be stable to complete abx therapy as outpatient.   Subjective: Patient seen and examined, patient refused aspiration on r chest wall collection. No new complaints. Leg swelling has improve significantly. No acute events overnight and patient remains afebrile   Discharge Diagnoses/Hospital Course:  Chest wall abscess/ MSSA bacteremia Status post ultrasound-guided drainage- drain in place removed prior to discharge as low  output  ID recommending 6 weeks of IV antibiotic to finish course on Sep 16 2017 CT of the chest was repeated showing improvement of chest wall abscess, although scapular abscess with some collection, surgical team recommending aspiration, but patient declined - will continue to treat with abx.  TEE shows no vegetations  Prostatic abscess s/p TURP Urology consulted no urological intervention at this moment, recommending irrigate Foley, get voiding trials. Patient on long-term antibiotics Repeated CT pelvis showed resolution of prostate abscess    HTN  Stable  Continue home medications with no changes   Type II DM - stable   Continue home medications    Cirrhosis  Stable  Follow up as outpatient  CT show hypodense nodule R liver lobe - recommending follow up CT in 3-6 months   Bilateral LE edema - improved with lasix  From cirrhosis  Treated with Lasix - significant improvement  Hypoalbuminemia from liver disease  Elevate leg, apply ted hose  No signs of DVT    Hypokalemia/Hypomagnesemia  Repleted  Repeat labs in 1 week   All other chronic medical condition were stable during the hospitalization.  Patient was seen by physical therapy, recommending SNF  On the day of the discharge the patient's vitals were stable, and no other acute medical condition were reported by patient. Patient was felt safe to be discharge to SNF   Discharge Instructions  You were cared for by a hospitalist during your hospital stay. If you have any questions about your discharge medications or the care you received while you were in the hospital after you are discharged, you can call the unit and  asked to speak with the hospitalist on call if the hospitalist that took care of you is not available. Once you are discharged, your primary care physician will handle any further medical issues. Please note that NO REFILLS for any discharge medications will be authorized once you are discharged, as it is  imperative that you return to your primary care physician (or establish a relationship with a primary care physician if you do not have one) for your aftercare needs so that they can reassess your need for medications and monitor your lab values.  Discharge Instructions    Call MD for:  difficulty breathing, headache or visual disturbances    Complete by:  As directed    Call MD for:  extreme fatigue    Complete by:  As directed    Call MD for:  hives    Complete by:  As directed    Call MD for:  persistant dizziness or light-headedness    Complete by:  As directed    Call MD for:  persistant nausea and vomiting    Complete by:  As directed    Call MD for:  redness, tenderness, or signs of infection (pain, swelling, redness, odor or green/yellow discharge around incision site)    Complete by:  As directed    Call MD for:  severe uncontrolled pain    Complete by:  As directed    Call MD for:  temperature >100.4    Complete by:  As directed    Diet - low sodium heart healthy    Complete by:  As directed    Home infusion instructions [place] May follow [place] Dosing Protocol; May administer Cathflo as needed to maintain patency of vascular access device.; Flushing of vascular access device: per [facility] Protocol: 0.9% NaCl pre/post medica...    Complete by:  As directed    Instructions:  May follow [place] Dosing Protocol   Instructions:  May administer Cathflo as needed to maintain patency of vascular access device.   Instructions:  Flushing of vascular access device: per [facility] Protocol: 0.9% NaCl pre/post medication administration and prn patency; Heparin 100 u/ml, 8m for implanted ports and Heparin 10u/ml, 52mfor all other central venous catheters.   Instructions:  May follow [facility] Anaphylaxis Protocol for First Dose Administration in the home: 0.9% NaCl at 25-50 ml/hr to maintain IV access for protocol meds. Epinephrine 0.3 ml IV/IM PRN and Benadryl 25-50 IV/IM PRN s/s of  anaphylaxis.   Instructions:  [facility] Coordinator (RN) to assist per patient IV care needs in the home PRN.   Increase activity slowly    Complete by:  As directed      Allergies as of 08/13/2017   No Known Allergies     Medication List    STOP taking these medications   oxyCODONE 5 MG immediate release tablet Commonly known as:  ROXICODONE     TAKE these medications   atorvastatin 20 MG tablet Commonly known as:  LIPITOR Take 20 mg by mouth daily.   ceFAZolin IVPB Commonly known as:  ANCEF Inject 2 g into the vein every 8 (eight) hours. Indication:  MSSA bacteremia Last Day of Therapy:  September 16, 2017 Labs - Once weekly:  CBC/D and BMP, Labs - Every other week:  ESR and CRP What changed:  how much to take  additional instructions   feeding supplement (GLUCERNA SHAKE) Liqd Take 237 mLs by mouth 2 (two) times daily between meals.  furosemide 20 MG tablet Commonly known as:  LASIX Take 1 tablet (20 mg total) by mouth daily.   insulin glargine 100 unit/mL Sopn Commonly known as:  LANTUS Inject 24 Units into the skin at bedtime.   lisinopril 10 MG tablet Commonly known as:  PRINIVIL,ZESTRIL Take 10 mg by mouth daily.   metFORMIN 1000 MG tablet Commonly known as:  GLUCOPHAGE Take 1 tablet (1,000 mg total) by mouth 2 (two) times daily.   metoprolol tartrate 25 MG tablet Commonly known as:  LOPRESSOR Take 0.5 tablets (12.5 mg total) by mouth 2 (two) times daily.   NOVOLOG FLEXPEN 100 UNIT/ML FlexPen Generic drug:  insulin aspart Inject 10 Units into the skin 3 (three) times daily with meals. Hold for CBG less than or = 150.  Notify MD for CBG < 70 or > 500   polyethylene glycol packet Commonly known as:  MIRALAX / GLYCOLAX Take 17 g by mouth daily.   spironolactone 25 MG tablet Commonly known as:  ALDACTONE Take 25 mg by mouth daily.            Home Infusion Instuctions        Start     Ordered   08/13/17 0000  Home infusion  instructions [facility] May follow [place] Dosing Protocol; May administer Cathflo as needed to maintain patency of vascular access device.; Flushing of vascular access device: per [facility] Protocol: 0.9% NaCl pre/post medica...    Question Answer Comment  Instructions May follow [place] Dosing Protocol   Instructions May administer Cathflo as needed to maintain patency of vascular access device.   Instructions Flushing of vascular access device: per [facility] Protocol: 0.9% NaCl pre/post medication administration and prn patency; Heparin 100 u/ml, 88m for implanted ports and Heparin 10u/ml, 535mfor all other central venous catheters.   Instructions May follow [facility] Anaphylaxis Protocol for First Dose Administration in the home: 0.9% NaCl at 25-50 ml/hr to maintain IV access for protocol meds. Epinephrine 0.3 ml IV/IM PRN and Benadryl 25-50 IV/IM PRN s/s of anaphylaxis.   Instructions [facility] Infusion Coordinator (RN) to assist per patient IV care needs in the home PRN.      08/13/17 1127     Follow-up Information    BaJearld FentonNP. Schedule an appointment as soon as possible for a visit in 1 week(s).   Specialty:  Internal Medicine Why:  Hospital follow up  Contact information: [id number] [phone]        No Known Allergies  Consultations:  IR   Gen surgery   ID  Urology    Procedures/Studies: Ct Abdomen Pelvis W Wo Contrast  Result Date: 07/26/2017 CLINICAL DATA:  Hematuria, weight loss, abdominal pain. Urinary tract infection. EXAM: CT ABDOMEN AND PELVIS WITHOUT AND WITH CONTRAST TECHNIQUE: Multidetector CT imaging of the abdomen and pelvis was performed following the standard protocol before and following the bolus administration of intravenous contrast. CONTRAST:  15027mSOVUE-300 IOPAMIDOL (ISOVUE-300) INJECTION 61% COMPARISON:  None. FINDINGS: Lower chest: 8 mm nodule seen in the right middle lobe. Image  quality is degraded by respiratory motion. Small left pleural effusion collapse/ consolidation in the left lower lobe. Heart size normal. No pericardial effusion. Distal esophagus is grossly unremarkable. Hepatobiliary: Liver margin is markedly irregular. Stones are seen in the gallbladder. No biliary ductal dilatation. Pancreas: Negative. Spleen: Negative. Adrenals/Urinary Tract: Adrenal glands are unremarkable. Stones are seen in the kidneys bilaterally. There is  a striated appearance in both kidneys with areas of ill-defined low attenuation on portal venous phase imaging. 1.3 cm low-attenuation lesion is seen in the interpolar left kidney (series 8, image 31), too small to characterize. On delayed imaging there is low attenuation material in the nondependent portion of the bladder. Stomach/Bowel: Stomach, small bowel, appendix and colon are grossly unremarkable. Vascular/Lymphatic: Atherosclerotic calcification of the arterial vasculature without abdominal aortic aneurysm. Borderline enlarged porta hepatis lymph nodes, likely reactive. Reproductive: Prostate is enlarged and contains a large area of low-attenuation, measuring approximately 3.9 x 4.1 cm. Other: Moderate ascites.  Mild presacral edema. Musculoskeletal: Diffuse body wall edema. There are multiple fluid collections within the chest wall musculature, some of which are best seen on nephrographic phase imaging and some of which have peripheral high attenuation on portal venous phase imaging. Index collection in the left lateral chest wall measures 3.3 cm (incompletely imaged, series 5, image 1). A 1.3 cm low-attenuation collection with peripheral high attenuation is seen in the high right thigh musculature (series 5, image 76). No worrisome lytic or sclerotic lesions. IMPRESSION: 1. Organized fluid collections within the prostate and soft tissue musculature, highly worrisome for abscesses. 2. Striated appearance of both kidneys, most indicative of  pyelonephritis. Difficult to exclude developing abscesses. 3. Debris in the bladder.  Difficult to exclude a mass. 4. Right middle lobe nodule. Metastatic disease is not excluded. Continued attention on follow-up is recommended. 5. Cirrhosis with moderate ascites. 6. Small left pleural effusion with collapse/consolidation in the left lower lobe. 7. Bilateral renal stones. 8. Cholelithiasis. 9.  Aortic atherosclerosis (ICD10-170.0). Electronically Signed   By: Lorin Picket M.D.   On: 07/26/2017 08:03   Dg Chest 2 View  Result Date: 07/15/2017 CLINICAL DATA:  Weight loss.  Diabetes. EXAM: CHEST  2 VIEW COMPARISON:  None. FINDINGS: The heart size and mediastinal contours are within normal limits. Both lungs are clear. The visualized skeletal structures are unremarkable. IMPRESSION: No active cardiopulmonary disease. Electronically Signed   By: Van Clines M.D.   On: 07/15/2017 13:42   Ct Chest W Contrast  Result Date: 08/10/2017 CLINICAL DATA:  Followup chest wall fluid collection. EXAM: CT CHEST WITH CONTRAST TECHNIQUE: Multidetector CT imaging of the chest was performed during intravenous contrast administration. CONTRAST:  76m ISOVUE-300 IOPAMIDOL (ISOVUE-300) INJECTION 61% COMPARISON:  Chest CT 08/04/2017 FINDINGS: Cardiovascular: The heart is normal in size. No pericardial effusion. The aorta is normal in caliber. No dissection. The scattered atherosclerotic calcifications. Aberrant right subclavian artery again demonstrated. The pulmonary arteries are grossly normal. Mediastinum/Nodes: No mediastinal or hilar mass or adenopathy. Small scattered lymph nodes are stable. The esophagus is grossly normal. Lungs/Pleura: Persistent small left pleural effusion with minimal overlying left basilar atelectasis. No edema, focal infiltrates or worrisome pulmonary lesions. Upper Abdomen: Advanced cirrhotic changes involving the liver with portal venous hypertension, portal venous collaterals, ascites and mild  splenomegaly. No obvious liver lesions. Cholelithiasis is noted. Musculoskeletal: Left-sided chest wall drainage catheter is still in place. I do not see any residual fluid collection/abscess. The right-sided rim enhancing fluid collection/abscess measures 2.3 cm and previously measured 4.0 cm. No new abscesses are identified. IMPRESSION: 1. Resolution of left chest wall abscess. 2. Interval decrease in size of the right chest wall abscess. 3. Small persistent left pleural effusion with overlying atelectasis. 4. Stable advanced cirrhotic changes and the sequela of such. Aortic Atherosclerosis (ICD10-I70.0). Electronically Signed   By: PMarijo SanesM.D.   On: 08/10/2017 16:48   Ct Chest W Contrast  Addendum Date: 08/04/2017   ADDENDUM REPORT: 08/04/2017 15:59 Electronically Signed   By: Marijo Conception, M.D.   On: 08/04/2017 15:59   Result Date: 08/04/2017 CLINICAL DATA:  History of pneumonia, followup, former smoking history EXAM: CT CHEST WITH CONTRAST TECHNIQUE: Multidetector CT imaging of the chest was performed during intravenous contrast administration. CONTRAST:  18m ISOVUE-300 IOPAMIDOL (ISOVUE-300) INJECTION 61% COMPARISON:  Chest x-ray of 07/24/2017 FINDINGS: Cardiovascular: Variation of an aberrant right subclavian artery is noted coursing posterior to the esophagus. The thoracic aorta is well opacified with no acute abnormality noted. Mild thoracic aortic atherosclerotic change present. The pulmonary arteries are not as well opacified but no abnormality is seen. There are coronary artery calcifications primarily in the distribution of the left anterior descending. The heart is within upper limits of normal. No pericardial effusion is seen. The mid ascending thoracic aorta measures 31 mm in diameter. Mediastinum/Nodes: No mediastinal or hilar adenopathy is seen the thyroid gland is normal in size. Lungs/Pleura: On lung window images, there is a moderate size left pleural effusion present. There may  be a small amount air within this collection of fluid posterior, and followup is recommended. Opacity posteriorly in the left lower lobe is most consistent with atelectasis or pneumonia. A few air bronchograms are noted through this area of dense partial consolidation. Mild atelectasis is noted posteriorly at the right lung base. There is a pleural-based nodular opacity anteriorly in the right middle lobe which on axial image 85 series 3 measures 8 mm, being slightly more prominent on sagittal images. A developing lung neoplasm cannot be excluded and continued followup is recommended in 4-6 months by CT the chest. Upper Abdomen: There does appear to be ascites within the upper abdomen. There is protrusion of the anterior margin of the left lobe of liver as on image 130 series 2 and a hepatic mass cannot be excluded. Abdominal ultrasound may be helpful to assess, but MR of the abdomen would be preferable to evaluate this area further. Musculoskeletal: The thoracic vertebrae are in normal alignment with no compression deformity noted. IMPRESSION: 1. Moderate size left pleural effusion. Small amount air may be within this fluid collection and continued followup is recommended. 2. Opacity posteriorly in the left lower lobe consistent with atelectasis or pneumonia. 3. 8 mm pleural-based opacity in the anterior right middle lobe. Cannot exclude neoplasm. Recommend CT of the chest is followup in 4-6 months. 4. Possible hepatic lesion anteriorly in the left lobe. Consider ultrasound or preferably MRI to assess further. Ascites in the upper abdomen. 5. Aberrant right subclavian artery. Electronically Signed: By: PIvar DrapeM.D. On: 07/27/2017 16:10   Ct Chest W Contrast  Result Date: 08/04/2017 CLINICAL DATA:  Left-sided chest wall pain. EXAM: CT CHEST WITH CONTRAST TECHNIQUE: Multidetector CT imaging of the chest was performed during intravenous contrast administration. CONTRAST:  766mISOVUE-300 IOPAMIDOL (ISOVUE-300)  INJECTION 61% COMPARISON:  Radiograph of same day.  CT scan of July 27, 2017. FINDINGS: Cardiovascular: No significant vascular findings. Normal heart size. No pericardial effusion. 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 Mediastinum/Nodes: No enlarged mediastinal, hilar, or axillary lymph nodes. Thyroid gland, trachea, and esophagus demonstrate no significant findings. Lungs/Pleura: No pneumothorax is noted. Minimal left pleural effusion is noted with adjacent subsegmental atelectasis. Stable 8 mm subpleural nodule is noted anteriorly in right middle lobe best seen on image number 87 of series 7. Upper Abdomen: Hepatic cirrhosis is noted. Moderate ascites is noted in visualized portion of upper  abdomen. Possible lesion is noted anteriorly in left hepatic lobe. Musculoskeletal: No fracture is noted. Large fluid collection is seen tracking along the lateral and posterior portion of the left chest wall most consistent with abscess. Subscapular component measures 5.3 x 2.8 cm. Posterior component measures 10.6 x 1.6 cm. Inferior and lateral component measures 8.2 x 3.6 cm. IMPRESSION: Large fluid collection is seen tracking along the lateral and posterior portion of the left chest wall most consistent with abscess. Its inferior component measures 8.2 x 3.6 cm, and more superior subscapular component measures 5.3 x 2.8 cm. Stable 8 mm right middle lobe nodule is noted. Non-contrast chest CT at 6-12 months is recommended. If the nodule is stable at time of repeat CT, then future CT at 18-24 months (from today's scan) is considered optional for low-risk patients, but is recommended for high-risk patients. This recommendation follows the consensus statement: Guidelines for Management of Incidental Pulmonary Nodules Detected on CT Images: From the Fleischner Society 2017; Radiology 2017; 284:228-243. Minimal left pleural effusion is noted with adjacent subsegmental atelectasis.  Hepatic cirrhosis is noted with moderate ascites. Possible partially exophytic lesion is seen arising from left hepatic lobe; MRI of the liver with and without gadolinium is recommended on nonemergent basis to rule out neoplasm. Electronically Signed   By: Marijo Conception, M.D.   On: 08/04/2017 15:56   US Pelvis Limited (transabdominal Only)  Result Date: 07/25/2017 CLINICAL DATA:  Urinary retention and possible benign prostatic hypertrophy. EXAM: LIMITED ULTRASOUND OF PELVIS TECHNIQUE: Limited transabdominal ultrasound examination of the pelvis was performed. COMPARISON:  None. FINDINGS: Posterior urinary bladder wall mass greater than expected for the inter your icteric ridge shown for example on image 13, there is a 5.9 by 1.0 by 5.1 cm in thickness. Transitional cell carcinoma is a distinct possibility. The the prostate is moderately enlarged, volume calculated at 69 cubic cm. Calcifications in the prostate parenchyma are observed. Prevoid bladder volume was 146 cubic cm. Postvoid volume was 38 cubic cm. IMPRESSION: 1. Possible posterior bladder wall mass, for example image 13, transitional cell carcinoma is not excluded and cystoscopy is recommended. 2. Moderate enlargement the prostate gland at 69 cubic cm. Electronically Signed   By: Van Clines M.D.   On: 07/25/2017 12:19   Ct Abdomen Pelvis W Contrast  Result Date: 08/12/2017 CLINICAL DATA:  Fever. Abdominal pain. Inpatient. Percutaneous ultrasound-guided drainage of intramuscular left chest wall abscess on 08/05/2017. EXAM: CT ABDOMEN AND PELVIS WITH CONTRAST TECHNIQUE: Multidetector CT imaging of the abdomen and pelvis was performed using the standard protocol following bolus administration of intravenous contrast. CONTRAST:  100 cc Isovue-300 IV. COMPARISON:  07/25/2017 CT abdomen/ pelvis. FINDINGS: Lower chest: Stable small dependent left pleural effusion. Mild dependent left lower lobe atelectasis, stable. No residual measurable fluid  collection in the visualized left posterior chest wall at the site of the percutaneous drain. Hepatobiliary: Diffusely irregular liver surface with relative hypertrophy of the lateral segment left liver lobe, compatible with cirrhosis. Hypodense 0.6 cm segment 6 right liver lobe lesion (series 3/ image 23), too small to characterize, stable since 07/25/2017. No additional liver lesions. Cholelithiasis. Contracted gallbladder with no gallbladder wall thickening. No biliary ductal dilatation. Pancreas: Normal, with no mass or duct dilation. Spleen: Normal size. No mass. Adrenals/Urinary Tract: Normal adrenals. Clustered nonobstructing lower left renal stones measuring up to 9 mm. Nonobstructing 3 mm lower right renal stone. Subcentimeter hypodense renal cortical lesion in the posterior interpolar left kidney, too small to characterize, stable since 07/25/2017,  requiring no follow-up. No additional renal lesions. No hydronephrosis. Excreted contrast is seen in the nondistended bladder, which appears grossly normal. Stomach/Bowel: Grossly normal stomach. Normal caliber small bowel with no small bowel wall thickening. Normal appendix. Oral contrast progresses to the hepatic flexure of the colon. Relatively collapsed large bowel with no large bowel wall thickening or diverticulosis. Vascular/Lymphatic: Atherosclerotic nonaneurysmal abdominal aorta. Patent portal, splenic, hepatic and renal veins. Stable small paraumbilical and moderate perirectal varices. Stable enlarged 1.3 cm gastrohepatic ligament node (series 3/ image 19). Stable enlarged 1.2 cm porta hepatis node (series 3/ image 21). Stable enlarged 1.4 cm portacaval node (series 3/ image 23). No new pathologically enlarged abdominopelvic nodes. Reproductive: Interval decreased size of the prostate. Stable nonspecific internal prostatic calcifications. Heterogeneous prostate, with no residual measurable prostatic abscess. Other: No pneumoperitoneum. Moderate volume  ascites, increased. Stable anasarca. No discrete fluid collections. Musculoskeletal: No aggressive appearing focal osseous lesions. IMPRESSION: 1. Heterogeneous prostate, significantly decreased in size in the interval. No residual measurable prostatic abscess. 2. No residual measurable abscess in the visualized left posterior chest wall at the site of the percutaneous drain. 3. No new focal abdominopelvic fluid collections. 4. Cirrhosis. Subcentimeter hypodense segment 6 right liver lobe lesion, stable since 07/25/2017. Recommend attention on follow-up MRI abdomen without and with IV contrast in 3-6 months. 5. Moderate volume ascites, increased. Stable small paraumbilical and moderate perirectal varices. 6. Stable mild upper retroperitoneal adenopathy, nonspecific, probably reactive. 7. Stable small dependent left pleural effusion . 8. Cholelithiasis. No evidence of acute cholecystitis. No biliary ductal dilatation. 9. Nonobstructing bilateral nephrolithiasis. 10.  Aortic Atherosclerosis (ICD10-I70.0). Electronically Signed   By: Ilona Sorrel M.D.   On: 08/12/2017 17:03   US Aspiration  Result Date: 08/05/2017 INDICATION: 56 year old male with a history of left shoulder abscess, subscapular EXAM: ULTRASOUND-GUIDED DRAINAGE LEFT POSTERIOR CHEST WALL ABSCESS MEDICATIONS: The patient is currently admitted to the hospital and receiving intravenous antibiotics. The antibiotics were administered within an appropriate time frame prior to the initiation of the procedure. ANESTHESIA/SEDATION: Fentanyl 2.0 mcg IV; Versed 100 mg IV Moderate Sedation Time:  20 minutes The patient was continuously monitored during the procedure by the interventional radiology nurse under my direct supervision. COMPLICATIONS: None PROCEDURE: Informed written consent was obtained from the patient after a thorough discussion of the procedural risks, benefits and alternatives. All questions were addressed. Maximal Sterile Barrier Technique was  utilized including caps, mask, sterile gowns, sterile gloves, sterile drape, hand hygiene and skin antiseptic. A timeout was performed prior to the initiation of the procedure. Patient positioned right decubitus on the ultrasound table. Scout ultrasound images were acquired with images stored sent to PACs. The patient was then prepped and draped in the usual sterile fashion, and the skin and subcutaneous tissues were generously infiltrated 1% lidocaine for local anesthesia. Small stab incision was made with 11 blade scalpel. Using ultrasound guidance, trocar needle was advanced under ultrasound guidance into the fluid collection of the posterior left chest. Modified Seldinger technique was then used to place a 10 [place] drain into the fluid collection. Aspiration of frankly purulent an bloody fluid was performed with a sample sent to the lab for analysis. Ten [place] drain was sutured to the chest wall an attached to gravity drainage. Patient tolerated the procedure well and remained hemodynamically stable throughout. No complications were encountered and no significant blood loss. IMPRESSION: Status post ultrasound-guided drainage of posterior left chest wall abscess associated with the scapula. Sample was sent to the lab for culture. Signed, York Cerise  Pasty Arch, DO [facility] Radiology Electronically Signed   By: Corrie Mckusick D.O.   On: 08/05/2017 17:16   Dg Chest Port 1 View  Result Date: 08/04/2017 CLINICAL DATA:  Port placement EXAM: PORTABLE CHEST 1 VIEW COMPARISON:  Chest CT 07/27/2017 FINDINGS: Right upper extremity PICC with tip at the upper cavoatrial junction. Normal heart size. Stable aortic contours. There is no edema, consolidation, effusion, or pneumothorax. IMPRESSION: Right upper extremity PICC with tip at the upper cavoatrial junction. No evidence of active cardiopulmonary disease. Electronically Signed   By: Monte Fantasia M.D.   On: 08/04/2017  15:20   Dg Chest Port 1 View  Result Date: 07/24/2017 CLINICAL DATA:  Sepsis, hypoxia, rule out pneumonia. EXAM: PORTABLE CHEST 1 VIEW COMPARISON:  Chest radiograph 07/15/2017 FINDINGS: The cardiomediastinal contours are normal. The lungs are clear. Pulmonary vasculature is normal. No consolidation, pleural effusion, or pneumothorax. No acute osseous abnormalities are seen. IMPRESSION: No acute abnormality.  No evidence of pneumonia. Electronically Signed   By: Jeb Levering M.D.   On: 07/24/2017 05:03    Discharge Exam: Vitals:   08/12/17 2128 08/13/17 0527  BP: 114/63 113/65  Pulse: 68 63  Resp: 20 18  Temp: 98.9 F (37.2 C) 98.5 F (36.9 C)  SpO2: 100% 98%   Vitals:   08/12/17 0518 08/12/17 0935 08/12/17 2128 08/13/17 0527  BP: 102/64 106/71 114/63 113/65  Pulse: 63 64 68 63  Resp: 20  20 18   Temp: 98.8 F (37.1 C)  98.9 F (37.2 C) 98.5 F (36.9 C)  TempSrc: Oral  Oral Oral  SpO2: 100%  100% 98%  Weight:      Height:        General: Pt is alert, awake, not in acute distress Cardiovascular: RRR, S1/S2 +, no rubs, no gallops Respiratory: CTA bilaterally, no wheezing, no rhonchi Abdominal: Soft slight distended, NT, bowel sounds + Extremities: B/L LE trace edema    The results of significant diagnostics from this hospitalization (including imaging, microbiology, ancillary and laboratory) are listed below for reference.     Microbiology: Recent Results (from the past 240 hour(s))  Blood culture (routine x 2)     Status: None   Collection Time: 08/04/17 11:45 AM  Result Value Ref Range Status   Specimen Description BLOOD LEFT ANTECUBITAL  Final   Special Requests   Final    BOTTLES DRAWN AEROBIC AND ANAEROBIC Blood Culture adequate volume   Culture NO GROWTH 5 DAYS  Final   Report Status 08/09/2017 FINAL  Final  Blood culture (routine x 2)     Status: None   Collection Time: 08/04/17 11:52 AM  Result Value Ref Range Status   Specimen Description BLOOD LEFT  WRIST  Final   Special Requests   Final    BOTTLES DRAWN AEROBIC AND ANAEROBIC Blood Culture adequate volume   Culture NO GROWTH 5 DAYS  Final   Report Status 08/09/2017 FINAL  Final  Urine culture     Status: Abnormal   Collection Time: 08/04/17  5:07 PM  Result Value Ref Range Status   Specimen Description URINE, CLEAN CATCH  Final   Special Requests NONE  Final   Culture >=100,000 COLONIES/mL YEAST (A)  Final   Report Status 08/05/2017 FINAL  Final  MRSA PCR Screening     Status: None   Collection Time: 08/04/17  6:56 PM  Result Value Ref Range Status   MRSA by PCR NEGATIVE NEGATIVE Final  Comment:        The GeneXpert MRSA Assay (FDA approved for NASAL specimens only), is one component of a comprehensive MRSA colonization surveillance program. It is not intended to diagnose MRSA infection nor to guide or monitor treatment for MRSA infections.   Aerobic/Anaerobic Culture (surgical/deep wound)     Status: None   Collection Time: 08/05/17  4:28 PM  Result Value Ref Range Status   Specimen Description ABSCESS BACK  Final   Special Requests NONE  Final   Gram Stain   Final    ABUNDANT WBC PRESENT, PREDOMINANTLY PMN FEW GRAM POSITIVE COCCI IN CLUSTERS    Culture FEW STAPHYLOCOCCUS AUREUS NO ANAEROBES ISOLATED   Final   Report Status 08/10/2017 FINAL  Final   Organism ID, Bacteria STAPHYLOCOCCUS AUREUS  Final      Susceptibility   Staphylococcus aureus - MIC*    CIPROFLOXACIN <=0.5 SENSITIVE Sensitive     ERYTHROMYCIN <=0.25 SENSITIVE Sensitive     GENTAMICIN <=0.5 SENSITIVE Sensitive     OXACILLIN <=0.25 SENSITIVE Sensitive     TETRACYCLINE <=1 SENSITIVE Sensitive     VANCOMYCIN <=0.5 SENSITIVE Sensitive     TRIMETH/SULFA <=10 SENSITIVE Sensitive     CLINDAMYCIN <=0.25 SENSITIVE Sensitive     RIFAMPIN <=0.5 SENSITIVE Sensitive     Inducible Clindamycin NEGATIVE Sensitive     * FEW STAPHYLOCOCCUS AUREUS     Labs: BNP (last 3 results) No results for input(s):  BNP in the last 8760 hours. Basic Metabolic Panel:  Recent Labs Lab 08/08/17 0346 08/09/17 0454 08/10/17 0403 08/11/17 0432 08/13/17 0317  NA 135 134* 134* 135 134*  K 3.4* 3.1* 3.3* 3.3* 4.0  CL 102 103 104 102 102  CO2 28 28 27 28 28   GLUCOSE 121* 171* 109* 228* 119*  BUN 7 6 7  5* 6  CREATININE 0.62 0.60* 0.60* 0.70 0.69  CALCIUM 7.6* 7.3* 7.5* 7.5* 7.8*  MG  --   --   --   --  1.6*   Liver Function Tests:  Recent Labs Lab 08/13/17 0317  ALBUMIN 1.5*   No results for input(s): LIPASE, AMYLASE in the last 168 hours. No results for input(s): AMMONIA in the last 168 hours. CBC: No results for input(s): WBC, NEUTROABS, HGB, HCT, MCV, PLT in the last 168 hours. Cardiac Enzymes: No results for input(s): CKTOTAL, CKMB, CKMBINDEX, TROPONINI in the last 168 hours. BNP: Invalid input(s): POCBNP CBG:  Recent Labs Lab 08/12/17 0746 08/12/17 1158 08/12/17 1644 08/12/17 2127 08/13/17 0827  GLUCAP 167* 135* 173* 185* 162*   D-Dimer No results for input(s): DDIMER in the last 72 hours. Hgb A1c No results for input(s): HGBA1C in the last 72 hours. Lipid Profile No results for input(s): CHOL, HDL, LDLCALC, TRIG, CHOLHDL, LDLDIRECT in the last 72 hours. Thyroid function studies No results for input(s): TSH, T4TOTAL, T3FREE, THYROIDAB in the last 72 hours.  Invalid input(s): FREET3 Anemia work up No results for input(s): VITAMINB12, FOLATE, FERRITIN, TIBC, IRON, RETICCTPCT in the last 72 hours. Urinalysis    Component Value Date/Time   COLORURINE AMBER (A) 08/04/2017 1707   APPEARANCEUR CLOUDY (A) 08/04/2017 1707   LABSPEC 1.012 08/04/2017 1707   PHURINE 8.0 08/04/2017 1707   GLUCOSEU NEGATIVE 08/04/2017 1707   HGBUR LARGE (A) 08/04/2017 1707   BILIRUBINUR NEGATIVE 08/04/2017 1707   KETONESUR NEGATIVE 08/04/2017 1707   PROTEINUR 100 (A) 08/04/2017 1707   NITRITE NEGATIVE 08/04/2017 1707   LEUKOCYTESUR LARGE (A) 08/04/2017 1707   Sepsis Labs  Invalid input(s):  PROCALCITONIN,  WBC,  LACTICIDVEN Microbiology Recent Results (from the past 240 hour(s))  Blood culture (routine x 2)     Status: None   Collection Time: 08/04/17 11:45 AM  Result Value Ref Range Status   Specimen Description BLOOD LEFT ANTECUBITAL  Final   Special Requests   Final    BOTTLES DRAWN AEROBIC AND ANAEROBIC Blood Culture adequate volume   Culture NO GROWTH 5 DAYS  Final   Report Status 08/09/2017 FINAL  Final  Blood culture (routine x 2)     Status: None   Collection Time: 08/04/17 11:52 AM  Result Value Ref Range Status   Specimen Description BLOOD LEFT WRIST  Final   Special Requests   Final    BOTTLES DRAWN AEROBIC AND ANAEROBIC Blood Culture adequate volume   Culture NO GROWTH 5 DAYS  Final   Report Status 08/09/2017 FINAL  Final  Urine culture     Status: Abnormal   Collection Time: 08/04/17  5:07 PM  Result Value Ref Range Status   Specimen Description URINE, CLEAN CATCH  Final   Special Requests NONE  Final   Culture >=100,000 COLONIES/mL YEAST (A)  Final   Report Status 08/05/2017 FINAL  Final  MRSA PCR Screening     Status: None   Collection Time: 08/04/17  6:56 PM  Result Value Ref Range Status   MRSA by PCR NEGATIVE NEGATIVE Final    Comment:        The GeneXpert MRSA Assay (FDA approved for NASAL specimens only), is one component of a comprehensive MRSA colonization surveillance program. It is not intended to diagnose MRSA infection nor to guide or monitor treatment for MRSA infections.   Aerobic/Anaerobic Culture (surgical/deep wound)     Status: None   Collection Time: 08/05/17  4:28 PM  Result Value Ref Range Status   Specimen Description ABSCESS BACK  Final   Special Requests NONE  Final   Gram Stain   Final    ABUNDANT WBC PRESENT, PREDOMINANTLY PMN FEW GRAM POSITIVE COCCI IN CLUSTERS    Culture FEW STAPHYLOCOCCUS AUREUS NO ANAEROBES ISOLATED   Final   Report Status 08/10/2017 FINAL  Final   Organism ID, Bacteria STAPHYLOCOCCUS  AUREUS  Final      Susceptibility   Staphylococcus aureus - MIC*    CIPROFLOXACIN <=0.5 SENSITIVE Sensitive     ERYTHROMYCIN <=0.25 SENSITIVE Sensitive     GENTAMICIN <=0.5 SENSITIVE Sensitive     OXACILLIN <=0.25 SENSITIVE Sensitive     TETRACYCLINE <=1 SENSITIVE Sensitive     VANCOMYCIN <=0.5 SENSITIVE Sensitive     TRIMETH/SULFA <=10 SENSITIVE Sensitive     CLINDAMYCIN <=0.25 SENSITIVE Sensitive     RIFAMPIN <=0.5 SENSITIVE Sensitive     Inducible Clindamycin NEGATIVE Sensitive     * FEW STAPHYLOCOCCUS AUREUS    Time coordinating discharge: 35 minutes  SIGNED:  Chipper Oman, MD  [facility] 08/13/2017, 11:27 AM  Pager please text page via  [email or web address] Password [email or web address]

## 2017-08-13 NOTE — Progress Notes (Signed)
Pt.refused his tube flushed &  dsd.changed

## 2017-08-13 NOTE — Progress Notes (Signed)
  Referring Physician(s): Dr M Wakefield  Supervising Physician: Shick, Trevor  Patient Status:  [facility] - In-pt  Chief Complaint:  Left chest wall abscess Drain placed 9/27  Subjective:  Minimal OP from left chest wall drain Serous fluid Feels better daily Was in US yesterday for aspiration of right chest wall collection--- Refused aspiration   Allergies: Patient has no known allergies.  Medications: Prior to Admission medications   Medication Sig Start Date End Date Taking? Authorizing Provider  metFORMIN (GLUCOPHAGE) 1000 MG tablet Take 1 tablet (1,000 mg total) by mouth 2 (two) times daily. 07/15/17  Yes Robinson, Patrick, MD  atorvastatin (LIPITOR) 20 MG tablet Take 20 mg by mouth daily. 07/30/17   [provider]  ceFAZolin (ANCEF) IVPB Inject 1 g into the vein every 8 (eight) hours. Indication:  MSSA bacteremia Last Day of Therapy: october 29th Labs - Once weekly:  CBC/D and BMP, Labs - Every other week:  ESR and CRP 07/29/17   Konidena, Snehalatha, MD  insulin aspart (NOVOLOG FLEXPEN) 100 UNIT/ML FlexPen Inject 10 Units into the skin 3 (three) times daily with meals. Hold for CBG less than or = 150.  Notify MD for CBG < 70 or > 500    [provider]  insulin glargine (LANTUS) 100 unit/mL SOPN Inject 24 Units into the skin at bedtime.    [provider]  lisinopril (PRINIVIL,ZESTRIL) 10 MG tablet Take 10 mg by mouth daily.    [provider]  metoprolol tartrate (LOPRESSOR) 25 MG tablet Take 0.5 tablets (12.5 mg total) by mouth 2 (two) times daily. 07/29/17   Konidena, Snehalatha, MD  oxyCODONE (ROXICODONE) 5 MG immediate release tablet Take 1 tablet (5 mg total) by mouth every 4 (four) hours as needed for severe pain. 08/02/17   Green, Deborah S, NP  polyethylene glycol (MIRALAX / GLYCOLAX) packet Take 17 g by mouth daily.    [provider]  spironolactone (ALDACTONE) 25 MG tablet Take 25 mg by mouth daily. 08/03/17   [provider]     Vital Signs: BP 113/65 (BP Location: Left Arm)   Pulse 63   Temp 98.5 F (36.9 C) (Oral)   Resp 18   Ht 5' 9" (1.753 m)   Wt 168 lb (76.2 kg)   SpO2 98%   BMI 24.81 kg/m   Physical Exam  Constitutional: He is oriented to person, place, and time.  Musculoskeletal: Normal range of motion.  Neurological: He is alert and oriented to person, place, and time.  Skin: Skin is dry.  Skin site is clean and dry NT no bleeding +staph aureus  Psychiatric: He has a normal mood and affect. His behavior is normal.  Nursing note and vitals reviewed.   Imaging: Ct Chest W Contrast  Result Date: 08/10/2017 CLINICAL DATA:  Followup chest wall fluid collection. EXAM: CT CHEST WITH CONTRAST TECHNIQUE: Multidetector CT imaging of the chest was performed during intravenous contrast administration. CONTRAST:  75mL ISOVUE-300 IOPAMIDOL (ISOVUE-300) INJECTION 61% COMPARISON:  Chest CT 08/04/2017 FINDINGS: Cardiovascular: The heart is normal in size. No pericardial effusion. The aorta is normal in caliber. No dissection. The scattered atherosclerotic calcifications. Aberrant right subclavian artery again demonstrated. The pulmonary arteries are grossly normal. Mediastinum/Nodes: No mediastinal or hilar mass or adenopathy. Small scattered lymph nodes are stable. The esophagus is grossly normal. Lungs/Pleura: Persistent small left pleural effusion with minimal overlying left basilar atelectasis. No edema, focal infiltrates or worrisome pulmonary lesions. Upper Abdomen: Advanced cirrhotic changes involving the   liver with portal venous hypertension, portal venous collaterals, ascites and mild splenomegaly. No obvious liver lesions. Cholelithiasis is noted. Musculoskeletal: Left-sided chest wall drainage catheter is still in place. I do not see any residual fluid collection/abscess. The right-sided rim enhancing fluid collection/abscess measures 2.3 cm and previously measured 4.0 cm. No new  abscesses are identified. IMPRESSION: 1. Resolution of left chest wall abscess. 2. Interval decrease in size of the right chest wall abscess. 3. Small persistent left pleural effusion with overlying atelectasis. 4. Stable advanced cirrhotic changes and the sequela of such. Aortic Atherosclerosis (ICD10-I70.0). Electronically Signed   By: Marijo Sanes M.D.   On: 08/10/2017 16:48   Ct Abdomen Pelvis W Contrast  Result Date: 08/12/2017 CLINICAL DATA:  Fever. Abdominal pain. Inpatient. Percutaneous ultrasound-guided drainage of intramuscular left chest wall abscess on 08/05/2017. EXAM: CT ABDOMEN AND PELVIS WITH CONTRAST TECHNIQUE: Multidetector CT imaging of the abdomen and pelvis was performed using the standard protocol following bolus administration of intravenous contrast. CONTRAST:  100 cc Isovue-300 IV. COMPARISON:  07/25/2017 CT abdomen/ pelvis. FINDINGS: Lower chest: Stable small dependent left pleural effusion. Mild dependent left lower lobe atelectasis, stable. No residual measurable fluid collection in the visualized left posterior chest wall at the site of the percutaneous drain. Hepatobiliary: Diffusely irregular liver surface with relative hypertrophy of the lateral segment left liver lobe, compatible with cirrhosis. Hypodense 0.6 cm segment 6 right liver lobe lesion (series 3/ image 23), too small to characterize, stable since 07/25/2017. No additional liver lesions. Cholelithiasis. Contracted gallbladder with no gallbladder wall thickening. No biliary ductal dilatation. Pancreas: Normal, with no mass or duct dilation. Spleen: Normal size. No mass. Adrenals/Urinary Tract: Normal adrenals. Clustered nonobstructing lower left renal stones measuring up to 9 mm. Nonobstructing 3 mm lower right renal stone. Subcentimeter hypodense renal cortical lesion in the posterior interpolar left kidney, too small to characterize, stable since 07/25/2017, requiring no follow-up. No additional renal lesions. No  hydronephrosis. Excreted contrast is seen in the nondistended bladder, which appears grossly normal. Stomach/Bowel: Grossly normal stomach. Normal caliber small bowel with no small bowel wall thickening. Normal appendix. Oral contrast progresses to the hepatic flexure of the colon. Relatively collapsed large bowel with no large bowel wall thickening or diverticulosis. Vascular/Lymphatic: Atherosclerotic nonaneurysmal abdominal aorta. Patent portal, splenic, hepatic and renal veins. Stable small paraumbilical and moderate perirectal varices. Stable enlarged 1.3 cm gastrohepatic ligament node (series 3/ image 19). Stable enlarged 1.2 cm porta hepatis node (series 3/ image 21). Stable enlarged 1.4 cm portacaval node (series 3/ image 23). No new pathologically enlarged abdominopelvic nodes. Reproductive: Interval decreased size of the prostate. Stable nonspecific internal prostatic calcifications. Heterogeneous prostate, with no residual measurable prostatic abscess. Other: No pneumoperitoneum. Moderate volume ascites, increased. Stable anasarca. No discrete fluid collections. Musculoskeletal: No aggressive appearing focal osseous lesions. IMPRESSION: 1. Heterogeneous prostate, significantly decreased in size in the interval. No residual measurable prostatic abscess. 2. No residual measurable abscess in the visualized left posterior chest wall at the site of the percutaneous drain. 3. No new focal abdominopelvic fluid collections. 4. Cirrhosis. Subcentimeter hypodense segment 6 right liver lobe lesion, stable since 07/25/2017. Recommend attention on follow-up MRI abdomen without and with IV contrast in 3-6 months. 5. Moderate volume ascites, increased. Stable small paraumbilical and moderate perirectal varices. 6. Stable mild upper retroperitoneal adenopathy, nonspecific, probably reactive. 7. Stable small dependent left pleural effusion . 8. Cholelithiasis. No evidence of acute cholecystitis. No biliary ductal  dilatation. 9. Nonobstructing bilateral nephrolithiasis. 10.  Aortic Atherosclerosis (ICD10-I70.0). Electronically  Signed   By: Ilona Sorrel M.D.   On: 08/12/2017 17:03    Labs:  CBC:  Recent Labs  07/25/17 0308 07/27/17 1300 08/04/17 1145 08/04/17 1206 08/05/17 0459  WBC 15.4* 8.1 6.4  --  5.4  HGB 14.5 12.4* 10.4* 12.6* 9.7*  HCT 42.4 36.3* 29.7* 37.0* 28.5*  PLT 153 136* 103*  --  106*    COAGS:  Recent Labs  07/24/17 1223 07/26/17 0153 08/05/17 0459  INR 1.41  --  1.32  APTT 31 58*  --     BMP:  Recent Labs  08/09/17 0454 08/10/17 0403 08/11/17 0432 08/13/17 0317  NA 134* 134* 135 134*  K 3.1* 3.3* 3.3* 4.0  CL 103 104 102 102  CO2 _0 GLUCOSE 171* 109* 228* 119*  BUN 6 7 5* 6  CALCIUM 7.3* 7.5* 7.5* 7.8*  CREATININE 0.60* 0.60* 0.70 0.69  GFRNONAA >60 >60 >60 >60  GFRAA >60 >60 >60 >60    LIVER FUNCTION TESTS:  Recent Labs  08/14/16 1411 07/15/17 1315 07/31/17 08/04/17 1145 08/05/17 0459 08/13/17 0317  BILITOT 1.1 2.1*  --  1.9* 1.8*  --   AST 162* 179* 130* 92* 85*  --   ALT 130* 126* 60* 36 34  --   ALKPHOS 245* 137* 183* 106 105  --   PROT 8.1 8.5*  --  9.0* 8.0  --   ALBUMIN 3.0* 2.8*  --  1.5* 1.3* 1.5*    Assessment and Plan:  Left scapular abscess drain intact Will follow Plan per [facility] When to remove drain?  Electronically Signed: Lavonia Drafts, PA-C 08/13/2017, 9:57 AM   I spent a total of 15 Minutes at the the patient's bedside AND on the patient's hospital floor or unit, greater than 50% of which was counseling/coordinating care for left chest wall/scapular abscess drain

## 2017-08-13 NOTE — Progress Notes (Signed)
Patient will DC to: [facility] Anticipated DC date: 08/13/17 Family notified: Sister Transport by: Hermina Barters   Per MD patient ready for DC to [facility]. RN, patient, patient's family, and facility notified of DC. Discharge Summary sent to facility. RN given number for report [phone]). LOG done. DC packet on chart. Ambulance transport requested for patient.   CSW signing off.  Cristobal Goldmann, [place] Clinical Social Worker [phone]

## 2017-08-13 NOTE — Progress Notes (Signed)
Patient still refused to flush drain even after being told he should by IR and myself. Madelin Rear, MSN, RN, [employer]

## 2017-08-16 ENCOUNTER — Non-Acute Institutional Stay (SKILLED_NURSING_FACILITY): Payer: Self-pay | Admitting: Adult Health

## 2017-08-16 ENCOUNTER — Encounter: Payer: Self-pay | Admitting: Adult Health

## 2017-08-16 DIAGNOSIS — L02212 Cutaneous abscess of back [any part, except buttock]: Secondary | ICD-10-CM

## 2017-08-16 DIAGNOSIS — Z794 Long term (current) use of insulin: Secondary | ICD-10-CM

## 2017-08-16 DIAGNOSIS — A4101 Sepsis due to Methicillin susceptible Staphylococcus aureus: Secondary | ICD-10-CM

## 2017-08-16 DIAGNOSIS — E1169 Type 2 diabetes mellitus with other specified complication: Secondary | ICD-10-CM

## 2017-08-16 DIAGNOSIS — E785 Hyperlipidemia, unspecified: Secondary | ICD-10-CM

## 2017-08-16 DIAGNOSIS — I10 Essential (primary) hypertension: Secondary | ICD-10-CM

## 2017-08-16 DIAGNOSIS — E118 Type 2 diabetes mellitus with unspecified complications: Secondary | ICD-10-CM

## 2017-08-16 DIAGNOSIS — N200 Calculus of kidney: Secondary | ICD-10-CM

## 2017-08-16 NOTE — Progress Notes (Addendum)
Location:   [place] Room Number: [place] of Service:  SNF (31)   CODE STATUS: DNR  No Known Allergies  Chief Complaint  Patient presents with  . Hospitalization Follow-up    Hospital follow up    HPI:  He is an 56 year old who has been hospitalized for a chest wall abscess MSSA bacteremia. He did have a drain present to the chest wall wound which has been removed. He will need IV abt until 09/16/17. He did decline aspiration of his scapular area. He has had a prostate abscess status post turp. He is here for short term rehab and IV abt management. His goal is to return back home. There are no nursing concerns at this time. There are no reports of fever present; no change in appetite; no complaint of pain at this present time.    Past Medical History:  Diagnosis Date  . Heart murmur    "born w/one"  . Type II diabetes mellitus ([place])     Past Surgical History:  Procedure Laterality Date  . TEE WITHOUT CARDIOVERSION N/A 08/10/2017   Procedure: TRANSESOPHAGEAL ECHOCARDIOGRAM (TEE);  Surgeon: Lelon Perla, MD;  Location: [place];  Service: Cardiovascular;  Laterality: N/A;  . TONSILLECTOMY  1982  . TRANSURETHRAL RESECTION OF PROSTATE N/A 07/26/2017   Procedure: TRANSURETHRAL RESECTION OF THE PROSTATE (TURP) WITH UPROOFING OF PROSTATE ABSCESS;  Surgeon: Irine Seal, MD;  Location: [facility];  Service: Urology;  Laterality: N/A;    Social History   Social History  . Marital status: Divorced    Spouse name: N/A  . Number of children: 3  . Years of education: N/A   Occupational History  . Truck Geophysicist/field seismologist    Social History Main Topics  . Smoking status: Current Every Day Smoker    Packs/day: 0.50    Years: 32.00    Types: Cigarettes  . Smokeless tobacco: Never Used  . Alcohol use 12.6 oz/week    21 Cans of beer per week     Comment: "drink qd"  . Drug use: Yes    Types: Cocaine     Comment: 08/04/2017 "nothing since ~ 02/2017"  . Sexual activity:  Not Currently   Other Topics Concern  . Not on file   Social History Narrative   Lives with mom   Family History  Problem Relation Age of Onset  . Hyperlipidemia Mother   . Diabetes Mother   . Hyperlipidemia Father   . Diabetes Father       VITAL SIGNS BP (!) 134/94   Pulse (!) 59   Temp 98.4 F (36.9 C)   Resp 16   Ht _0  (1.753 m)   Wt 168 lb (76.2 kg)   SpO2 99%   BMI 24.81 kg/m   Patient's Medications  New Prescriptions   No medications on file  Previous Medications   ATORVASTATIN (LIPITOR) 20 MG TABLET    Take 20 mg by mouth daily.   CEFAZOLIN (ANCEF) IVPB    Inject 2 g into the vein every 8 (eight) hours. Indication:  MSSA bacteremia Last Day of Therapy:  September 16, 2017 Labs - Once weekly:  CBC/D and BMP, Labs - Every other week:  ESR and CRP   FUROSEMIDE (LASIX) 20 MG TABLET    Take 1 tablet (20 mg total) by mouth daily.   INSULIN ASPART (NOVOLOG FLEXPEN) 100 UNIT/ML FLEXPEN    Inject 10 Units into the skin 3 (three) times daily with  meals. Hold for CBG less than or = 150.  Notify MD for CBG < 70 or > 500   INSULIN GLARGINE (LANTUS) 100 UNIT/ML SOPN    Inject 24 Units into the skin at bedtime.   LISINOPRIL (PRINIVIL,ZESTRIL) 10 MG TABLET    Take 10 mg by mouth daily.   METFORMIN (GLUCOPHAGE) 1000 MG TABLET    Take 1 tablet (1,000 mg total) by mouth 2 (two) times daily.   METOPROLOL TARTRATE (LOPRESSOR) 25 MG TABLET    Take 0.5 tablets (12.5 mg total) by mouth 2 (two) times daily.   POLYETHYLENE GLYCOL (MIRALAX / GLYCOLAX) PACKET    Take 17 g by mouth daily.   SPIRONOLACTONE (ALDACTONE) 25 MG TABLET    Take 25 mg by mouth daily.  Modified Medications   No medications on file  Discontinued Medications   FEEDING SUPPLEMENT, GLUCERNA SHAKE, (GLUCERNA SHAKE) LIQD    Take 237 mLs by mouth 2 (two) times daily between meals.   NUTRITIONAL SUPPLEMENTS (NUTRITIONAL SUPPLEMENT PO)    House 2.0     SIGNIFICANT DIAGNOSTIC EXAMS  PREVIOUS:   07-24-17: chest  x-ray: No acute abnormality. No evidence of pneumonia.  07-25-17: pelvic ultrasound: 1. Possible posterior bladder wall mass, for example image 13, transitional cell carcinoma is not excluded and cystoscopy is recommended. 2. Moderate enlargement the prostate gland at 69 cubic cm.  07-25-17: ct of abdomen and pelvis: 1. Organized fluid collections within the prostate and soft tissue musculature, highly worrisome for abscesses. 2. Striated appearance of both kidneys, most indicative of pyelonephritis. Difficult to exclude developing abscesses. 3. Debris in the bladder.  Difficult to exclude a mass. 4. Right middle lobe nodule. Metastatic disease is not excluded. Continued attention on follow-up is recommended. 5. Cirrhosis with moderate ascites. 6. Small left pleural effusion with collapse/consolidation in the left lower lobe. 7. Bilateral renal stones. 8. Cholelithiasis. 9.  Aortic atherosclerosis   07-27-17: ct of chest: 1. Moderate size left pleural effusion. Small amount air may be within this fluid collection and continued followup is recommended. 2. Opacity posteriorly in the left lower lobe consistent with atelectasis or pneumonia. 3. 8 mm pleural-based opacity in the anterior right middle lobe. Cannot exclude neoplasm. Recommend CT of the chest is followup in 4-6 months. 4. Possible hepatic lesion anteriorly in the left lobe. Consider ultrasound or preferably MRI to assess further. Ascites in the upper abdomen. 5. Aberrant right subclavian artery.  TODAY  08-04-17: chest x-ray: Right upper extremity PICC with tip at the upper cavoatrial junction. No evidence of active cardiopulmonary disease.  08-04-17: ct of chest: Large fluid collection is seen tracking along the lateral and posterior portion of the left chest wall most consistent with abscess. Its inferior component measures 8.2 x 3.6 cm, and more superior subscapular component measures 5.3 x 2.8 cm. Stable 8 mm right middle lobe  nodule is noted. Non-contrast chest CT at 6-12 months is recommended. I  08-10-17: ct of chest: 1. Resolution of left chest wall abscess. 2. Interval decrease in size of the right chest wall abscess. 3. Small persistent left pleural effusion with overlying atelectasis. 4. Stable advanced cirrhotic changes and the sequela of such. Aortic Atherosclerosis (ICD10-I70.0).  08-12-17: ct of abdomen and pelvis: 1. Heterogeneous prostate, significantly decreased in size in the interval. No residual measurable prostatic abscess. 2. No residual measurable abscess in the visualized left posterior chest wall at the site of the percutaneous drain. 3. No new focal abdominopelvic fluid collections. 4. Cirrhosis. Subcentimeter hypodense segment  6 right liver lobe lesion, stable since 07/25/2017. Recommend attention on follow-up MRI abdomen without and with IV contrast in 3-6 months. 5. Moderate volume ascites, increased. Stable small paraumbilical and moderate perirectal varices. 6. Stable mild upper retroperitoneal adenopathy, nonspecific, probably reactive. 7. Stable small dependent left pleural effusion . 8. Cholelithiasis. No evidence of acute cholecystitis. No biliary ductal dilatation. 9. Nonobstructing bilateral nephrolithiasis. 10.  Aortic Atherosclerosis (ICD10-I70.0).  LABS REVIEWED: PREVIOUS:   07-24-17: wbc 17.9; hgb 15.4; hct 44.8; mcv 92.9; plt 195; glucose 498; bun 10; creat 0.95; k+ 5.6; na++123; ca 8.2; BC MSSA; HIV: nr; hgb a1c 14.5 07-25-17: wbc 15.4; hgb 14.5; hct 42.7; mcv 92.9; plt 129; glucose 83; bun 12; creat 0.61; k+ 3.1; na++ 129; ca 7.98: urine culture: MSSA 07-27-17: wbc 8.1; hgb 12.4; hct 26.2; mcv 93.2; plt 136;  07-28-17: glucose 231; bun 8; creat 0.70; k+ 3.4; n++ 130; ca 7.3   TODAY  08-04-17: wbc 6.4; hgb 10.4; hct 29.7; mcv 88.1 plt 103; glucose 121; bun 5; creat 0.67; k+ 3.9; na+ 131; ca 7.9; ast 92; albumin 1.5; blood culture: no growth; hgb a1c 12.8; urine culture:  yeast 08-07-17: glucose 137; bun 5; creat 0.56; k+ 3.1; na++ 134; ca 7.4 08-09-17: glucose 171; bun 6; creat 0.6; k+ 3.1; na++ 134; ca 7.3 08-13-17: glucose 119; bun 6; creat 0.69; k+ 4.0; na++ 134; ca 7.8; mag 1.6; albumin 1.5   Review of Systems  Constitutional: Negative for malaise/fatigue.  Respiratory: Negative for cough and shortness of breath.   Cardiovascular: Negative for chest pain, palpitations and leg swelling.  Gastrointestinal: Negative for abdominal pain, constipation and heartburn.  Genitourinary:       Foley has been removed   Musculoskeletal: Negative for back pain, joint pain and myalgias.  Skin: Negative.   Neurological: Negative for dizziness.  Psychiatric/Behavioral: The patient is not nervous/anxious.     Physical Exam  Constitutional: He is oriented to person, place, and time. No distress.  thin  Eyes: Conjunctivae are normal.  Neck: Normal range of motion. Neck supple. No thyromegaly present.  Cardiovascular: Normal rate, regular rhythm, normal heart sounds and intact distal pulses.   Pulmonary/Chest: Effort normal and breath sounds normal. No respiratory distress.  Abdominal: Soft. Bowel sounds are normal. He exhibits no distension. There is no tenderness.  Musculoskeletal: Normal range of motion. He exhibits no edema.  Lymphadenopathy:    He has no cervical adenopathy.  Neurological: He is alert and oriented to person, place, and time.  Skin: Skin is warm and dry. He is not diaphoretic.  Incision line without signs of infection present   Psychiatric:  Is angry and upset     ASSESSMENT/ PLAN:  1. Hypertension: stable  b/p 134/94: will continue lopressor 12.5 mg twice daily  lisinopril  10 mg daily and will monitor   2. Dyslipidemia: no change in status; will continue lipitor 20 mg daily   3. Diabetes: slightly improved: hgb a1c 12.8  Will continue  lantus to 24 units;novolog  10 units after meals metformin 1 gm twice daily  will monitor  4. Sepsis:  prostate abscess;  Chest wall and scapular abscess pneumonia: is on ancef 2 gm every 8 hours through 09-16-17: with cbc; with diff weekly and esr and crp every other week.  Is having pain will begin oxycodone 5 mg every 8 hours as needed    7. Constipation; is stable : will continue miralax 17 gm daily   8. Bilateral renal stone: without change: will  monitor   9.  Cirrhosis: without change: will  monitor ast 92  Will continue aldactone 25 mg daily  10. Lower extremity edema: stable will continue lasix 20 mg daily   11. Hypomagnesemia: mag 1.6; will begin mag ox 400 mg daily and will repeat mag level   12 UTI: with yeast will begin diflucan 200 mg daily for 14 days will monitor   Will check cmp   MD is aware of resident's narcotic use and is in agreement with current plan of care. We will attempt to wean resident as apropriate    Ok Edwards NP [facility]  Contact [phone] Monday through Friday 8am- 5pm  After hours call [phone]

## 2017-08-18 ENCOUNTER — Other Ambulatory Visit: Payer: Self-pay | Admitting: *Deleted

## 2017-08-18 MED ORDER — METFORMIN HCL 1000 MG PO TABS: 1000.0000 mg | ORAL_TABLET | Freq: Two times a day (BID) | ORAL | 0 refills | Status: DC

## 2017-08-19 ENCOUNTER — Encounter: Payer: Self-pay | Admitting: Internal Medicine

## 2017-08-19 ENCOUNTER — Non-Acute Institutional Stay (SKILLED_NURSING_FACILITY): Payer: Self-pay | Admitting: Internal Medicine

## 2017-08-19 DIAGNOSIS — L02212 Cutaneous abscess of back [any part, except buttock]: Secondary | ICD-10-CM

## 2017-08-19 DIAGNOSIS — E43 Unspecified severe protein-calorie malnutrition: Secondary | ICD-10-CM

## 2017-08-19 DIAGNOSIS — E118 Type 2 diabetes mellitus with unspecified complications: Secondary | ICD-10-CM

## 2017-08-19 DIAGNOSIS — A4101 Sepsis due to Methicillin susceptible Staphylococcus aureus: Secondary | ICD-10-CM

## 2017-08-19 DIAGNOSIS — Z794 Long term (current) use of insulin: Secondary | ICD-10-CM

## 2017-08-19 DIAGNOSIS — I10 Essential (primary) hypertension: Secondary | ICD-10-CM

## 2017-08-19 DIAGNOSIS — K7689 Other specified diseases of liver: Secondary | ICD-10-CM

## 2017-08-19 DIAGNOSIS — R600 Localized edema: Secondary | ICD-10-CM

## 2017-08-19 DIAGNOSIS — N390 Urinary tract infection, site not specified: Secondary | ICD-10-CM

## 2017-08-19 NOTE — Progress Notes (Signed)
Patient ID: Cameron Coffey, male   DOB: 1961-02-08, 56 y.o.   MRN: [id number]     HISTORY AND PHYSICAL   DATE:   August 19, 2017  Location:   [facility] Room Number: [place] of Service: SNF (31)   Extended Emergency Contact Information Primary Emergency Contact: [place] Mobile Phone: [phone] Relation: Sister Secondary Emergency Contact: Rito Ehrlich [place] Mobile Phone: [phone] Relation: Significant other  Advanced Directive information Does Patient Have a Medical Advance Directive?: Yes, Would patient like information on creating a medical advance directive?: No - Patient declined, Type of Advance Directive: Out of facility DNR (pink MOST or yellow form), Pre-existing out of facility DNR order (yellow form or pink MOST form): Yellow form placed in chart (order not valid for inpatient use), Does patient want to make changes to medical advance directive?: No - Patient declined  Chief Complaint  Patient presents with  . Readmit To SNF    Readmission    HPI:  56 yo male sen today as a readmission into SNF following hospital stay for left CW abscess, MSSA bacteremia, prostate abscess, BLE edema, right hepatic lobe nodule, electrolyte derangement. He presented to the ED from SNF with left suprascapular pain and swelling. Imaging revealed scapula/CW abscess and it was drained by IR. Wound Cx (+) MSSA and blood cx (+) MSSA. ID consulted and recommended IV abx x 6 weeks. TEE showed no vegetations. Repeat CT chest showed reduced collection size. CT pelvis revealed prostate abscess. Repeat CT pelvis revealed prostate abscess resolved. Urine cx (+) >100K yeast. Pt declined surgical tx of CW abscess. He presents to SNF to continue short term rehab.  Today he reports pain in scapular area controlled. He has pain in feet due to swelling. He also reports GI upset 2/2 abx. No f/c. No nursing issues. No falls. Tolerating  tx. Appetite reduced and sleeps well. Albumin 1.8. He is taking diflucan 2/2 yeast UTI.  Hypertension - BP stable on lopressor 12.5 mg twice daily;  lisinopril  10 mg daily  Dyslipidemia - stable on lipitor 20 mg daily   DM - uncontrolled.  A1c 12.8%.  Takes  lantus 24 units; novolog 10 units after meals; metformin 1 gm twice daily     Constipation - stable on miralax 17 gm daily   Hepatic Cirrhosis - stable. AST85; takes aldactone 25 mg daily  B/l Lower extremity edema - stable on lasix 20 mg daily   Hypomagnesemia - stable on supplemental Mag Ox. Mg level 1.6  FTT/protein calorie malnutrition - albumin 1.8; he gets nutritional supplements per facility protocol.  Past Medical History:  Diagnosis Date  . Heart murmur    "born w/one"  . Type II diabetes mellitus ([place])     Past Surgical History:  Procedure Laterality Date  . TEE WITHOUT CARDIOVERSION N/A 08/10/2017   Procedure: TRANSESOPHAGEAL ECHOCARDIOGRAM (TEE);  Surgeon: Lelon Perla, MD;  Location: [facility];  Service: Cardiovascular;  Laterality: N/A;  . TONSILLECTOMY  1982  . TRANSURETHRAL RESECTION OF PROSTATE N/A 07/26/2017   Procedure: TRANSURETHRAL RESECTION OF THE PROSTATE (TURP) WITH UPROOFING OF PROSTATE ABSCESS;  Surgeon: Irine Seal, MD;  Location: [facility];  Service: Urology;  Laterality: N/A;    Patient Care Team: Jearld Fenton, NP as PCP - General (Internal Medicine)  Social History   Social History  . Marital status: Divorced    Spouse name: N/A  . Number of children: 3  .  Years of education: N/A   Occupational History  . Truck Geophysicist/field seismologist    Social History Main Topics  . Smoking status: Current Every Day Smoker    Packs/day: 0.50    Years: 32.00    Types: Cigarettes  . Smokeless tobacco: Never Used  . Alcohol use 12.6 oz/week    21 Cans of beer per week     Comment: "drink qd"  . Drug use: Yes    Types: Cocaine     Comment: 08/04/2017 "nothing since ~ 02/2017"  . Sexual activity: Not  Currently   Other Topics Concern  . Not on file   Social History Narrative   Lives with mom     reports that he has been smoking Cigarettes.  He has a 16.00 pack-year smoking history. He has never used smokeless tobacco. He reports that he drinks about 12.6 oz of alcohol per week . He reports that he uses drugs, including Cocaine.  Family History  Problem Relation Age of Onset  . Hyperlipidemia Mother   . Diabetes Mother   . Hyperlipidemia Father   . Diabetes Father    Family Status  Relation Status  . Mother Alive  . Father Alive    Immunization History  Administered Date(s) Administered  . Influenza,inj,Quad PF,6+ Mos 07/26/2017  . PPD Test 07/30/2017  . Pneumococcal Polysaccharide-23 07/26/2017    No Known Allergies  Medications: Patient's Medications  New Prescriptions   No medications on file  Previous Medications   ATORVASTATIN (LIPITOR) 20 MG TABLET    Take 20 mg by mouth daily.   CEFAZOLIN (ANCEF) IVPB    Inject 2 g into the vein every 8 (eight) hours. Indication:  MSSA bacteremia Last Day of Therapy:  September 16, 2017 Labs - Once weekly:  CBC/D and BMP, Labs - Every other week:  ESR and CRP   ESCITALOPRAM (LEXAPRO) 10 MG TABLET    Take 10 mg by mouth daily.   FLUCONAZOLE (DIFLUCAN) 200 MG TABLET    Take 200 mg by mouth daily.   FUROSEMIDE (LASIX) 20 MG TABLET    Take 1 tablet (20 mg total) by mouth daily.   INSULIN ASPART (NOVOLOG FLEXPEN) 100 UNIT/ML FLEXPEN    Inject 10 Units into the skin 3 (three) times daily with meals. Hold for CBG less than or = 150.  Notify MD for CBG < 70 or > 500   INSULIN GLARGINE (LANTUS) 100 UNIT/ML SOPN    Inject 24 Units into the skin at bedtime.   LISINOPRIL (PRINIVIL,ZESTRIL) 10 MG TABLET    Take 10 mg by mouth daily.   MAGNESIUM OXIDE (MAG-OX) 400 MG TABLET    Take 400 mg by mouth daily.   METFORMIN (GLUCOPHAGE) 1000 MG TABLET    Take 1 tablet (1,000 mg total) by mouth 2 (two) times daily. COMPLETE PHYSICAL EXAM WITH LABS  REQUIRED FOR ADDITIONAL REFILLS   METOPROLOL TARTRATE (LOPRESSOR) 25 MG TABLET    Take 0.5 tablets (12.5 mg total) by mouth 2 (two) times daily.   OXYCODONE (OXY IR/ROXICODONE) 5 MG IMMEDIATE RELEASE TABLET    Take 5 mg by mouth every 8 (eight) hours as needed for severe pain.   POLYETHYLENE GLYCOL (MIRALAX / GLYCOLAX) PACKET    Take 17 g by mouth daily.   SPIRONOLACTONE (ALDACTONE) 25 MG TABLET    Take 25 mg by mouth daily.  Modified Medications   No medications on file  Discontinued Medications   No medications on file    Review of  Systems  Cardiovascular: Positive for leg swelling.  Musculoskeletal: Positive for arthralgias and gait problem.  All other systems reviewed and are negative.   Vitals:   08/19/17 0937  BP: 122/86  Pulse: 65  Resp: 18  Temp: 98.2 F (36.8 C)  SpO2: 98%  Weight: 166 lb 12.8 oz (75.7 kg)  Height: 5' 9"  (1.753 m)   Body mass index is 24.63 kg/m.  Physical Exam  Constitutional: He is oriented to person, place, and time. He appears well-developed and well-nourished.  Frail appearing in NAD, lying in bed asleep but easily awakened  HENT:  Mouth/Throat: Oropharynx is clear and moist.  MMM; no oral thrush  Eyes: Pupils are equal, round, and reactive to light. No scleral icterus.  Neck: Neck supple. Carotid bruit is present (b/l systolic from chest). No thyromegaly present.  Cardiovascular: Normal rate, regular rhythm and intact distal pulses.  Exam reveals no gallop and no friction rub.   Murmur heard.  Systolic (radiating to carotid b/l) murmur is present with a grade of 2/6  +1 pitting LE edema b/l. No calf TTP. Right arm PICC line intact with no redness, d/c ot TTP at insertion site  Pulmonary/Chest: Effort normal and breath sounds normal. He has no wheezes. He has no rales. He exhibits no tenderness.  Abdominal: Soft. Normal appearance and bowel sounds are normal. He exhibits no distension, no abdominal bruit, no pulsatile midline mass and no  mass. There is no hepatomegaly. There is no tenderness. There is no rigidity, no rebound and no guarding. No hernia.  Musculoskeletal: He exhibits edema and tenderness.  Lymphadenopathy:    He has no cervical adenopathy.  Neurological: He is alert and oriented to person, place, and time.  Skin: Skin is warm and dry. No rash noted.     Psychiatric: He has a normal mood and affect. His behavior is normal. Judgment and thought content normal.     Labs reviewed: Admission on 08/04/2017, Discharged on 08/13/2017  No results displayed because visit has over 200 results.    Abstract on 08/02/2017  Component Date Value Ref Range Status  . Glucose 07/31/2017 154   Final  . BUN 07/31/2017 5  4 - 21 Final  . Creatinine 07/31/2017 0.5* 0.6 - 1.3 Final  . Potassium 07/31/2017 3.9  3.4 - 5.3 Final  . Sodium 07/31/2017 134* 137 - 147 Final  . Alkaline Phosphatase 07/31/2017 183* 25 - 125 Final  . ALT 07/31/2017 60* 10 - 40 Final  . AST 07/31/2017 130* 14 - 40 Final  . Bilirubin, Total 07/31/2017 1.8   Final  Admission on 07/24/2017, Discharged on 07/29/2017  No results displayed because visit has over 200 results.    Admission on 07/15/2017, Discharged on 07/15/2017  Component Date Value Ref Range Status  . Sodium 07/15/2017 120* 135 - 145 mmol/L Final  . Potassium 07/15/2017 5.1  3.5 - 5.1 mmol/L Final  . Chloride 07/15/2017 83* 101 - 111 mmol/L Final  . CO2 07/15/2017 24  22 - 32 mmol/L Final  . Glucose, Bld 07/15/2017 802* 65 - 99 mg/dL Final   Comment: CRITICAL RESULT CALLED TO, READ BACK BY AND VERIFIED WITH LYNDA MCLAMB ON 07/15/17 AT 1347 BY JAG   . BUN 07/15/2017 13  6 - 20 mg/dL Final  . Creatinine, Ser 07/15/2017 0.94  0.61 - 1.24 mg/dL Final  . Calcium 07/15/2017 9.1  8.9 - 10.3 mg/dL Final  . Total Protein 07/15/2017 8.5* 6.5 - 8.1 g/dL Final  . Albumin  07/15/2017 2.8* 3.5 - 5.0 g/dL Final  . AST 07/15/2017 179* 15 - 41 U/L Final  . ALT 07/15/2017 126* 17 - 63 U/L Final  .  Alkaline Phosphatase 07/15/2017 137* 38 - 126 U/L Final  . Total Bilirubin 07/15/2017 2.1* 0.3 - 1.2 mg/dL Final  . GFR calc non Af Amer 07/15/2017 >60  >60 mL/min Final  . GFR calc Af Amer 07/15/2017 >60  >60 mL/min Final   Comment: (NOTE) The eGFR has been calculated using the CKD EPI equation. This calculation has not been validated in all clinical situations. eGFR's persistently <60 mL/min signify possible Chronic Kidney Disease.   . Anion gap 07/15/2017 13  5 - 15 Final  . WBC 07/15/2017 7.0  3.8 - 10.6 K/uL Final  . RBC 07/15/2017 4.99  4.40 - 5.90 MIL/uL Final  . Hemoglobin 07/15/2017 15.5  13.0 - 18.0 g/dL Final  . HCT 07/15/2017 46.8  40.0 - 52.0 % Final  . MCV 07/15/2017 93.9  80.0 - 100.0 fL Final  . MCH 07/15/2017 31.1  26.0 - 34.0 pg Final  . MCHC 07/15/2017 33.2  32.0 - 36.0 g/dL Final  . RDW 07/15/2017 13.9  11.5 - 14.5 % Final  . Platelets 07/15/2017 101* 150 - 440 K/uL Final  . Neutrophils Relative % 07/15/2017 71  % Final  . Lymphocytes Relative 07/15/2017 15  % Final  . Monocytes Relative 07/15/2017 14  % Final  . Eosinophils Relative 07/15/2017 0  % Final  . Basophils Relative 07/15/2017 0  % Final  . Band Neutrophils 07/15/2017 0  % Final  . Metamyelocytes Relative 07/15/2017 0  % Final  . Myelocytes 07/15/2017 0  % Final  . Promyelocytes Absolute 07/15/2017 0  % Final  . Blasts 07/15/2017 0  % Final  . nRBC 07/15/2017 0  0 /100 WBC Final  . Other 07/15/2017 0  % Final  . Neutro Abs 07/15/2017 4.9  1.4 - 6.5 K/uL Final  . Lymphs Abs 07/15/2017 1.1  1.0 - 3.6 K/uL Final  . Monocytes Absolute 07/15/2017 1.0  0.2 - 1.0 K/uL Final  . Eosinophils Absolute 07/15/2017 0.0  0 - 0.7 K/uL Final  . Basophils Absolute 07/15/2017 0.0  0 - 0.1 K/uL Final  . Smear Review 07/15/2017 PLATELET CLUMPS NOTED ON SMEAR, COUNT APPEARS ADEQUATE   Final  . Osmolality 07/15/2017 304* 275 - 295 mOsm/kg Final  . pH, Ven 07/15/2017 7.43  7.250 - 7.430 Final  . pCO2, Ven 07/15/2017 40*  44.0 - 60.0 mmHg Final  . pO2, Ven 07/15/2017 67.0* 32.0 - 45.0 mmHg Final  . Bicarbonate 07/15/2017 26.5  20.0 - 28.0 mmol/L Final  . Acid-Base Excess 07/15/2017 2.0  0.0 - 2.0 mmol/L Final  . O2 Saturation 07/15/2017 93.6  % Final  . Patient temperature 07/15/2017 37.0   Final  . Collection site 07/15/2017 VEIN   Final  . Sample type 07/15/2017 VEIN   Final  . Color, Urine 07/15/2017 STRAW* YELLOW Final  . APPearance 07/15/2017 CLEAR* CLEAR Final  . Specific Gravity, Urine 07/15/2017 1.028  1.005 - 1.030 Final  . pH 07/15/2017 6.0  5.0 - 8.0 Final  . Glucose, UA 07/15/2017 >=500* NEGATIVE mg/dL Final  . Hgb urine dipstick 07/15/2017 SMALL* NEGATIVE Final  . Bilirubin Urine 07/15/2017 NEGATIVE  NEGATIVE Final  . Ketones, ur 07/15/2017 20* NEGATIVE mg/dL Final  . Protein, ur 07/15/2017 NEGATIVE  NEGATIVE mg/dL Final  . Nitrite 07/15/2017 NEGATIVE  NEGATIVE Final  . Leukocytes, UA 07/15/2017  TRACE* NEGATIVE Final  . RBC / HPF 07/15/2017 0-5  0 - 5 RBC/hpf Final  . WBC, UA 07/15/2017 TOO NUMEROUS TO COUNT  0 - 5 WBC/hpf Final  . Bacteria, UA 07/15/2017 NONE SEEN  NONE SEEN Final  . Squamous Epithelial / LPF 07/15/2017 0-5* NONE SEEN Final  . Mucus 07/15/2017 PRESENT   Final  . Glucose-Capillary 07/15/2017 568* 65 - 99 mg/dL Final  . Glucose-Capillary 07/15/2017 463* 65 - 99 mg/dL Final    Ct Abdomen Pelvis W Wo Contrast  Result Date: 07/26/2017 CLINICAL DATA:  Hematuria, weight loss, abdominal pain. Urinary tract infection. EXAM: CT ABDOMEN AND PELVIS WITHOUT AND WITH CONTRAST TECHNIQUE: Multidetector CT imaging of the abdomen and pelvis was performed following the standard protocol before and following the bolus administration of intravenous contrast. CONTRAST:  13m ISOVUE-300 IOPAMIDOL (ISOVUE-300) INJECTION 61% COMPARISON:  None. FINDINGS: Lower chest: 8 mm nodule seen in the right middle lobe. Image quality is degraded by respiratory motion. Small left pleural effusion collapse/  consolidation in the left lower lobe. Heart size normal. No pericardial effusion. Distal esophagus is grossly unremarkable. Hepatobiliary: Liver margin is markedly irregular. Stones are seen in the gallbladder. No biliary ductal dilatation. Pancreas: Negative. Spleen: Negative. Adrenals/Urinary Tract: Adrenal glands are unremarkable. Stones are seen in the kidneys bilaterally. There is a striated appearance in both kidneys with areas of ill-defined low attenuation on portal venous phase imaging. 1.3 cm low-attenuation lesion is seen in the interpolar left kidney (series 8, image 31), too small to characterize. On delayed imaging there is low attenuation material in the nondependent portion of the bladder. Stomach/Bowel: Stomach, small bowel, appendix and colon are grossly unremarkable. Vascular/Lymphatic: Atherosclerotic calcification of the arterial vasculature without abdominal aortic aneurysm. Borderline enlarged porta hepatis lymph nodes, likely reactive. Reproductive: Prostate is enlarged and contains a large area of low-attenuation, measuring approximately 3.9 x 4.1 cm. Other: Moderate ascites.  Mild presacral edema. Musculoskeletal: Diffuse body wall edema. There are multiple fluid collections within the chest wall musculature, some of which are best seen on nephrographic phase imaging and some of which have peripheral high attenuation on portal venous phase imaging. Index collection in the left lateral chest wall measures 3.3 cm (incompletely imaged, series 5, image 1). A 1.3 cm low-attenuation collection with peripheral high attenuation is seen in the high right thigh musculature (series 5, image 76). No worrisome lytic or sclerotic lesions. IMPRESSION: 1. Organized fluid collections within the prostate and soft tissue musculature, highly worrisome for abscesses. 2. Striated appearance of both kidneys, most indicative of pyelonephritis. Difficult to exclude developing abscesses. 3. Debris in the bladder.   Difficult to exclude a mass. 4. Right middle lobe nodule. Metastatic disease is not excluded. Continued attention on follow-up is recommended. 5. Cirrhosis with moderate ascites. 6. Small left pleural effusion with collapse/consolidation in the left lower lobe. 7. Bilateral renal stones. 8. Cholelithiasis. 9.  Aortic atherosclerosis (ICD10-170.0). Electronically Signed   By: MLorin PicketM.D.   On: 07/26/2017 08:03   Ct Chest W Contrast  Result Date: 08/10/2017 CLINICAL DATA:  Followup chest wall fluid collection. EXAM: CT CHEST WITH CONTRAST TECHNIQUE: Multidetector CT imaging of the chest was performed during intravenous contrast administration. CONTRAST:  721mISOVUE-300 IOPAMIDOL (ISOVUE-300) INJECTION 61% COMPARISON:  Chest CT 08/04/2017 FINDINGS: Cardiovascular: The heart is normal in size. No pericardial effusion. The aorta is normal in caliber. No dissection. The scattered atherosclerotic calcifications. Aberrant right subclavian artery again demonstrated. The pulmonary arteries are grossly normal. Mediastinum/Nodes: No mediastinal  or hilar mass or adenopathy. Small scattered lymph nodes are stable. The esophagus is grossly normal. Lungs/Pleura: Persistent small left pleural effusion with minimal overlying left basilar atelectasis. No edema, focal infiltrates or worrisome pulmonary lesions. Upper Abdomen: Advanced cirrhotic changes involving the liver with portal venous hypertension, portal venous collaterals, ascites and mild splenomegaly. No obvious liver lesions. Cholelithiasis is noted. Musculoskeletal: Left-sided chest wall drainage catheter is still in place. I do not see any residual fluid collection/abscess. The right-sided rim enhancing fluid collection/abscess measures 2.3 cm and previously measured 4.0 cm. No new abscesses are identified. IMPRESSION: 1. Resolution of left chest wall abscess. 2. Interval decrease in size of the right chest wall abscess. 3. Small persistent left pleural  effusion with overlying atelectasis. 4. Stable advanced cirrhotic changes and the sequela of such. Aortic Atherosclerosis (ICD10-I70.0). Electronically Signed   By: Marijo Sanes M.D.   On: 08/10/2017 16:48   Ct Chest W Contrast  Addendum Date: 08/04/2017   ADDENDUM REPORT: 08/04/2017 15:59 Electronically Signed   By: Marijo Conception, M.D.   On: 08/04/2017 15:59   Result Date: 08/04/2017 CLINICAL DATA:  History of pneumonia, followup, former smoking history EXAM: CT CHEST WITH CONTRAST TECHNIQUE: Multidetector CT imaging of the chest was performed during intravenous contrast administration. CONTRAST:  32m ISOVUE-300 IOPAMIDOL (ISOVUE-300) INJECTION 61% COMPARISON:  Chest x-ray of 07/24/2017 FINDINGS: Cardiovascular: Variation of an aberrant right subclavian artery is noted coursing posterior to the esophagus. The thoracic aorta is well opacified with no acute abnormality noted. Mild thoracic aortic atherosclerotic change present. The pulmonary arteries are not as well opacified but no abnormality is seen. There are coronary artery calcifications primarily in the distribution of the left anterior descending. The heart is within upper limits of normal. No pericardial effusion is seen. The mid ascending thoracic aorta measures 31 mm in diameter. Mediastinum/Nodes: No mediastinal or hilar adenopathy is seen the thyroid gland is normal in size. Lungs/Pleura: On lung window images, there is a moderate size left pleural effusion present. There may be a small amount air within this collection of fluid posterior, and followup is recommended. Opacity posteriorly in the left lower lobe is most consistent with atelectasis or pneumonia. A few air bronchograms are noted through this area of dense partial consolidation. Mild atelectasis is noted posteriorly at the right lung base. There is a pleural-based nodular opacity anteriorly in the right middle lobe which on axial image 85 series 3 measures 8 mm, being slightly more  prominent on sagittal images. A developing lung neoplasm cannot be excluded and continued followup is recommended in 4-6 months by CT the chest. Upper Abdomen: There does appear to be ascites within the upper abdomen. There is protrusion of the anterior margin of the left lobe of liver as on image 130 series 2 and a hepatic mass cannot be excluded. Abdominal ultrasound may be helpful to assess, but MR of the abdomen would be preferable to evaluate this area further. Musculoskeletal: The thoracic vertebrae are in normal alignment with no compression deformity noted. IMPRESSION: 1. Moderate size left pleural effusion. Small amount air may be within this fluid collection and continued followup is recommended. 2. Opacity posteriorly in the left lower lobe consistent with atelectasis or pneumonia. 3. 8 mm pleural-based opacity in the anterior right middle lobe. Cannot exclude neoplasm. Recommend CT of the chest is followup in 4-6 months. 4. Possible hepatic lesion anteriorly in the left lobe. Consider ultrasound or preferably MRI to assess further. Ascites in the upper abdomen. 5.  Aberrant right subclavian artery. Electronically Signed: By: Ivar Drape M.D. On: 07/27/2017 16:10   Ct Chest W Contrast  Result Date: 08/04/2017 CLINICAL DATA:  Left-sided chest wall pain. EXAM: CT CHEST WITH CONTRAST TECHNIQUE: Multidetector CT imaging of the chest was performed during intravenous contrast administration. CONTRAST:  79m ISOVUE-300 IOPAMIDOL (ISOVUE-300) INJECTION 61% COMPARISON:  Radiograph of same day.  CT scan of July 27, 2017. FINDINGS: Cardiovascular: No significant vascular findings. Normal heart size. No pericardial effusion. 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 Mediastinum/Nodes: No enlarged mediastinal, hilar, or axillary lymph nodes. Thyroid gland, trachea, and esophagus demonstrate no significant findings. Lungs/Pleura: No pneumothorax is noted. Minimal left pleural  effusion is noted with adjacent subsegmental atelectasis. Stable 8 mm subpleural nodule is noted anteriorly in right middle lobe best seen on image number 87 of series 7. Upper Abdomen: Hepatic cirrhosis is noted. Moderate ascites is noted in visualized portion of upper abdomen. Possible lesion is noted anteriorly in left hepatic lobe. Musculoskeletal: No fracture is noted. Large fluid collection is seen tracking along the lateral and posterior portion of the left chest wall most consistent with abscess. Subscapular component measures 5.3 x 2.8 cm. Posterior component measures 10.6 x 1.6 cm. Inferior and lateral component measures 8.2 x 3.6 cm. IMPRESSION: Large fluid collection is seen tracking along the lateral and posterior portion of the left chest wall most consistent with abscess. Its inferior component measures 8.2 x 3.6 cm, and more superior subscapular component measures 5.3 x 2.8 cm. Stable 8 mm right middle lobe nodule is noted. Non-contrast chest CT at 6-12 months is recommended. If the nodule is stable at time of repeat CT, then future CT at 18-24 months (from today's scan) is considered optional for low-risk patients, but is recommended for high-risk patients. This recommendation follows the consensus statement: Guidelines for Management of Incidental Pulmonary Nodules Detected on CT Images: From the Fleischner Society 2017; Radiology 2017; 284:228-243. Minimal left pleural effusion is noted with adjacent subsegmental atelectasis. Hepatic cirrhosis is noted with moderate ascites. Possible partially exophytic lesion is seen arising from left hepatic lobe; MRI of the liver with and without gadolinium is recommended on nonemergent basis to rule out neoplasm. Electronically Signed   By: JMarijo Conception M.D.   On: 08/04/2017 15:56   [place]elvis Limited (transabdominal Only)  Result Date: 07/25/2017 CLINICAL DATA:  Urinary retention and possible benign prostatic hypertrophy. EXAM: LIMITED ULTRASOUND OF  PELVIS TECHNIQUE: Limited transabdominal ultrasound examination of the pelvis was performed. COMPARISON:  None. FINDINGS: Posterior urinary bladder wall mass greater than expected for the inter your icteric ridge shown for example on image 13, there is a 5.9 by 1.0 by 5.1 cm in thickness. Transitional cell carcinoma is a distinct possibility. The the prostate is moderately enlarged, volume calculated at 69 cubic cm. Calcifications in the prostate parenchyma are observed. Prevoid bladder volume was 146 cubic cm. Postvoid volume was 38 cubic cm. IMPRESSION: 1. Possible posterior bladder wall mass, for example image 13, transitional cell carcinoma is not excluded and cystoscopy is recommended. 2. Moderate enlargement the prostate gland at 69 cubic cm. Electronically Signed   By: WVan ClinesM.D.   On: 07/25/2017 12:19   Ct Abdomen Pelvis W Contrast  Result Date: 08/12/2017 CLINICAL DATA:  Fever. Abdominal pain. Inpatient. Percutaneous ultrasound-guided drainage of intramuscular left chest wall abscess on 08/05/2017. EXAM: CT ABDOMEN AND PELVIS WITH CONTRAST TECHNIQUE: Multidetector CT imaging of the abdomen and pelvis was performed using the standard  protocol following bolus administration of intravenous contrast. CONTRAST:  100 cc Isovue-300 IV. COMPARISON:  07/25/2017 CT abdomen/ pelvis. FINDINGS: Lower chest: Stable small dependent left pleural effusion. Mild dependent left lower lobe atelectasis, stable. No residual measurable fluid collection in the visualized left posterior chest wall at the site of the percutaneous drain. Hepatobiliary: Diffusely irregular liver surface with relative hypertrophy of the lateral segment left liver lobe, compatible with cirrhosis. Hypodense 0.6 cm segment 6 right liver lobe lesion (series 3/ image 23), too small to characterize, stable since 07/25/2017. No additional liver lesions. Cholelithiasis. Contracted gallbladder with no gallbladder wall thickening. No biliary  ductal dilatation. Pancreas: Normal, with no mass or duct dilation. Spleen: Normal size. No mass. Adrenals/Urinary Tract: Normal adrenals. Clustered nonobstructing lower left renal stones measuring up to 9 mm. Nonobstructing 3 mm lower right renal stone. Subcentimeter hypodense renal cortical lesion in the posterior interpolar left kidney, too small to characterize, stable since 07/25/2017, requiring no follow-up. No additional renal lesions. No hydronephrosis. Excreted contrast is seen in the nondistended bladder, which appears grossly normal. Stomach/Bowel: Grossly normal stomach. Normal caliber small bowel with no small bowel wall thickening. Normal appendix. Oral contrast progresses to the hepatic flexure of the colon. Relatively collapsed large bowel with no large bowel wall thickening or diverticulosis. Vascular/Lymphatic: Atherosclerotic nonaneurysmal abdominal aorta. Patent portal, splenic, hepatic and renal veins. Stable small paraumbilical and moderate perirectal varices. Stable enlarged 1.3 cm gastrohepatic ligament node (series 3/ image 19). Stable enlarged 1.2 cm porta hepatis node (series 3/ image 21). Stable enlarged 1.4 cm portacaval node (series 3/ image 23). No new pathologically enlarged abdominopelvic nodes. Reproductive: Interval decreased size of the prostate. Stable nonspecific internal prostatic calcifications. Heterogeneous prostate, with no residual measurable prostatic abscess. Other: No pneumoperitoneum. Moderate volume ascites, increased. Stable anasarca. No discrete fluid collections. Musculoskeletal: No aggressive appearing focal osseous lesions. IMPRESSION: 1. Heterogeneous prostate, significantly decreased in size in the interval. No residual measurable prostatic abscess. 2. No residual measurable abscess in the visualized left posterior chest wall at the site of the percutaneous drain. 3. No new focal abdominopelvic fluid collections. 4. Cirrhosis. Subcentimeter hypodense segment 6  right liver lobe lesion, stable since 07/25/2017. Recommend attention on follow-up MRI abdomen without and with IV contrast in 3-6 months. 5. Moderate volume ascites, increased. Stable small paraumbilical and moderate perirectal varices. 6. Stable mild upper retroperitoneal adenopathy, nonspecific, probably reactive. 7. Stable small dependent left pleural effusion . 8. Cholelithiasis. No evidence of acute cholecystitis. No biliary ductal dilatation. 9. Nonobstructing bilateral nephrolithiasis. 10.  Aortic Atherosclerosis (ICD10-I70.0). Electronically Signed   By: Ilona Sorrel M.D.   On: 08/12/2017 17:03   US Aspiration  Result Date: 08/05/2017 INDICATION: 56 year old male with a history of left shoulder abscess, subscapular EXAM: ULTRASOUND-GUIDED DRAINAGE LEFT POSTERIOR CHEST WALL ABSCESS MEDICATIONS: The patient is currently admitted to the hospital and receiving intravenous antibiotics. The antibiotics were administered within an appropriate time frame prior to the initiation of the procedure. ANESTHESIA/SEDATION: Fentanyl 2.0 mcg IV; Versed 100 mg IV Moderate Sedation Time:  20 minutes The patient was continuously monitored during the procedure by the interventional radiology nurse under my direct supervision. COMPLICATIONS: None PROCEDURE: Informed written consent was obtained from the patient after a thorough discussion of the procedural risks, benefits and alternatives. All questions were addressed. Maximal Sterile Barrier Technique was utilized including caps, mask, sterile gowns, sterile gloves, sterile drape, hand hygiene and skin antiseptic. A timeout was performed prior to the initiation of the procedure. Patient positioned right decubitus  on the ultrasound table. Scout ultrasound images were acquired with images stored sent to PACs. The patient was then prepped and draped in the usual sterile fashion, and the skin and subcutaneous tissues were generously infiltrated 1% lidocaine for local  anesthesia. Small stab incision was made with 11 blade scalpel. Using ultrasound guidance, trocar needle was advanced under ultrasound guidance into the fluid collection of the posterior left chest. Modified Seldinger technique was then used to place a 10 [place] drain into the fluid collection. Aspiration of frankly purulent an bloody fluid was performed with a sample sent to the lab for analysis. Ten [place] drain was sutured to the chest wall an attached to gravity drainage. Patient tolerated the procedure well and remained hemodynamically stable throughout. No complications were encountered and no significant blood loss. IMPRESSION: Status post ultrasound-guided drainage of posterior left chest wall abscess associated with the scapula. Sample was sent to the lab for culture. Signed, Dulcy Fanny. Earleen Newport, DO [facility] Electronically Signed   By: Corrie Mckusick D.O.   On: 08/05/2017 17:16   Dg Chest Port 1 View  Result Date: 08/04/2017 CLINICAL DATA:  Port placement EXAM: PORTABLE CHEST 1 VIEW COMPARISON:  Chest CT 07/27/2017 FINDINGS: Right upper extremity PICC with tip at the upper cavoatrial junction. Normal heart size. Stable aortic contours. There is no edema, consolidation, effusion, or pneumothorax. IMPRESSION: Right upper extremity PICC with tip at the upper cavoatrial junction. No evidence of active cardiopulmonary disease. Electronically Signed   By: Monte Fantasia M.D.   On: 08/04/2017 15:20   Dg Chest Port 1 View  Result Date: 07/24/2017 CLINICAL DATA:  Sepsis, hypoxia, rule out pneumonia. EXAM: PORTABLE CHEST 1 VIEW COMPARISON:  Chest radiograph 07/15/2017 FINDINGS: The cardiomediastinal contours are normal. The lungs are clear. Pulmonary vasculature is normal. No consolidation, pleural effusion, or pneumothorax. No acute osseous abnormalities are seen. IMPRESSION: No acute abnormality.  No evidence of pneumonia. Electronically Signed   By:  Jeb Levering M.D.   On: 07/24/2017 05:03     Assessment/Plan   ICD-10-CM   1. MSSA (methicillin susceptible Staphylococcus aureus) septicemia ([place]) A41.01   2. Bilateral lower extremity edema R60.0   3. Urinary tract infection without hematuria, site unspecified N39.0    2/2 yeast >100K colonies  4. Type 2 diabetes mellitus with complication, with long-term current use of insulin ([facility]) E11.8    Z79.4   5. Essential hypertension I10   6. Abscess of scapular region L02.212   7. Nodule on liver K76.89    right lobe  8. Severe protein-calorie malnutrition ([place]) E43     Finish abx  - STOP DATE 09/16/17. Check blood cx  After abx complete and pull PICC line if cx neg x 48hrs  Complete 14 days of diflucan  F/u with specialists as scheduled (urology in 3-4 weeks)  Repeat CT abdomen in 3 mos to follow right hepatic lobe nodule  PT/OT/ST as ordered  Nutritional supplements as indicated  Wound care as indicated  GOAL: short term rehab and d/c home when medically appropriate. Communicated with pt and nursing.  Will follow  Phyliss Hulick S. Perlie Gold  [facility] [address] [place], [place] [address] [phone] Cell (Monday-Friday 8 AM - 5 PM) [phone] After 5 PM and follow prompts

## 2017-08-23 LAB — BASIC METABOLIC PANEL
BUN: 6 (ref 4–21)
CREATININE: 0.6 (ref 0.6–1.3)
Glucose: 133
POTASSIUM: 3.9 (ref 3.4–5.3)
SODIUM: 138 (ref 137–147)

## 2017-08-23 LAB — HEPATIC FUNCTION PANEL
ALT: 17 (ref 10–40)
AST: 90 — AB (ref 14–40)
Alkaline Phosphatase: 152 — AB (ref 25–125)
Bilirubin, Total: 0.8

## 2017-08-24 ENCOUNTER — Non-Acute Institutional Stay (SKILLED_NURSING_FACILITY): Payer: Self-pay | Admitting: Adult Health

## 2017-08-24 ENCOUNTER — Encounter: Payer: Self-pay | Admitting: Adult Health

## 2017-08-24 DIAGNOSIS — F418 Other specified anxiety disorders: Secondary | ICD-10-CM

## 2017-08-24 LAB — LIPID PANEL
Cholesterol: 110 (ref 0–200)
HDL: 43 (ref 35–70)
LDL Cholesterol: 55
Triglycerides: 61 (ref 40–160)

## 2017-08-24 NOTE — Progress Notes (Signed)
Location:   [place] Room Number: [place] of Service:  SNF (31)   CODE STATUS: DNR  No Known Allergies  Chief Complaint  Patient presents with  . Acute Visit    Insomnia    HPI:  He is complaining of insomnia. He states that he is sleeping all day and is unable to sleep at night. He has recently been started on lexapro 10 mg daily. He denies any other changes in his environment. There are no nursing concerns today.    Past Medical History:  Diagnosis Date  . Heart murmur    "born w/one"  . Type II diabetes mellitus ([place])     Past Surgical History:  Procedure Laterality Date  . TEE WITHOUT CARDIOVERSION N/A 08/10/2017   Procedure: TRANSESOPHAGEAL ECHOCARDIOGRAM (TEE);  Surgeon: Lelon Perla, MD;  Location: [place];  Service: Cardiovascular;  Laterality: N/A;  . TONSILLECTOMY  1982  . TRANSURETHRAL RESECTION OF PROSTATE N/A 07/26/2017   Procedure: TRANSURETHRAL RESECTION OF THE PROSTATE (TURP) WITH UPROOFING OF PROSTATE ABSCESS;  Surgeon: Irine Seal, MD;  Location: [facility];  Service: Urology;  Laterality: N/A;    Social History   Social History  . Marital status: Divorced    Spouse name: N/A  . Number of children: 3  . Years of education: N/A   Occupational History  . Truck Geophysicist/field seismologist    Social History Main Topics  . Smoking status: Current Every Day Smoker    Packs/day: 0.50    Years: 32.00    Types: Cigarettes  . Smokeless tobacco: Never Used  . Alcohol use 12.6 oz/week    21 Cans of beer per week     Comment: "drink qd"  . Drug use: Yes    Types: Cocaine     Comment: 08/04/2017 "nothing since ~ 02/2017"  . Sexual activity: Not Currently   Other Topics Concern  . Not on file   Social History Narrative   Lives with mom   Family History  Problem Relation Age of Onset  . Hyperlipidemia Mother   . Diabetes Mother   . Hyperlipidemia Father   . Diabetes Father       VITAL SIGNS BP 114/74   Pulse (!) 52   Temp (!) 96.9  F (36.1 C)   Resp 18   Ht _0  (1.753 m)   Wt 166 lb 12.8 oz (75.7 kg)   SpO2 98%   BMI 24.63 kg/m   Patient's Medications  New Prescriptions   No medications on file  Previous Medications   ATORVASTATIN (LIPITOR) 20 MG TABLET    Take 20 mg by mouth daily.   CEFAZOLIN (ANCEF) IVPB    Inject 2 g into the vein every 8 (eight) hours. Indication:  MSSA bacteremia Last Day of Therapy:  September 16, 2017 Labs - Once weekly:  CBC/D and BMP, Labs - Every other week:  ESR and CRP   ESCITALOPRAM (LEXAPRO) 10 MG TABLET    Take 10 mg by mouth daily.   FLUCONAZOLE (DIFLUCAN) 200 MG TABLET    Take 200 mg by mouth daily.   FUROSEMIDE (LASIX) 20 MG TABLET    Take 1 tablet (20 mg total) by mouth daily.   INSULIN ASPART (NOVOLOG FLEXPEN) 100 UNIT/ML FLEXPEN    Inject 10 Units into the skin 3 (three) times daily with meals. Hold for CBG less than or = 150.  Notify MD for CBG < 70 or > 500   INSULIN GLARGINE (LANTUS) 100  UNIT/ML SOPN    Inject 24 Units into the skin at bedtime.   LISINOPRIL (PRINIVIL,ZESTRIL) 10 MG TABLET    Take 10 mg by mouth daily.   MAGNESIUM OXIDE (MAG-OX) 400 MG TABLET    Take 400 mg by mouth daily.   METFORMIN (GLUCOPHAGE) 1000 MG TABLET    Take 1 tablet (1,000 mg total) by mouth 2 (two) times daily. COMPLETE PHYSICAL EXAM WITH LABS REQUIRED FOR ADDITIONAL REFILLS   METOPROLOL TARTRATE (LOPRESSOR) 25 MG TABLET    Take 0.5 tablets (12.5 mg total) by mouth 2 (two) times daily.   OXYCODONE (OXY IR/ROXICODONE) 5 MG IMMEDIATE RELEASE TABLET    Take 5 mg by mouth every 8 (eight) hours as needed for severe pain.   POLYETHYLENE GLYCOL (MIRALAX / GLYCOLAX) PACKET    Take 17 g by mouth daily.   SPIRONOLACTONE (ALDACTONE) 25 MG TABLET    Take 25 mg by mouth daily.  Modified Medications   No medications on file  Discontinued Medications   No medications on file     SIGNIFICANT DIAGNOSTIC EXAMS  PREVIOUS:   07-24-17: chest x-ray: No acute abnormality. No evidence of  pneumonia.  07-25-17: pelvic ultrasound: 1. Possible posterior bladder wall mass, for example image 13, transitional cell carcinoma is not excluded and cystoscopy is recommended. 2. Moderate enlargement the prostate gland at 69 cubic cm.  07-25-17: ct of abdomen and pelvis: 1. Organized fluid collections within the prostate and soft tissue musculature, highly worrisome for abscesses. 2. Striated appearance of both kidneys, most indicative of pyelonephritis. Difficult to exclude developing abscesses. 3. Debris in the bladder.  Difficult to exclude a mass. 4. Right middle lobe nodule. Metastatic disease is not excluded. Continued attention on follow-up is recommended. 5. Cirrhosis with moderate ascites. 6. Small left pleural effusion with collapse/consolidation in the left lower lobe. 7. Bilateral renal stones. 8. Cholelithiasis. 9.  Aortic atherosclerosis   07-27-17: ct of chest: 1. Moderate size left pleural effusion. Small amount air may be within this fluid collection and continued followup is recommended. 2. Opacity posteriorly in the left lower lobe consistent with atelectasis or pneumonia. 3. 8 mm pleural-based opacity in the anterior right middle lobe. Cannot exclude neoplasm. Recommend CT of the chest is followup in 4-6 months. 4. Possible hepatic lesion anteriorly in the left lobe. Consider ultrasound or preferably MRI to assess further. Ascites in the upper abdomen. 5. Aberrant right subclavian artery.  08-04-17: chest x-ray: Right upper extremity PICC with tip at the upper cavoatrial junction. No evidence of active cardiopulmonary disease.  08-04-17: ct of chest: Large fluid collection is seen tracking along the lateral and posterior portion of the left chest wall most consistent with abscess. Its inferior component measures 8.2 x 3.6 cm, and more superior subscapular component measures 5.3 x 2.8 cm. Stable 8 mm right middle lobe nodule is noted. Non-contrast chest CT at 6-12 months is  recommended. I  08-10-17: ct of chest: 1. Resolution of left chest wall abscess. 2. Interval decrease in size of the right chest wall abscess. 3. Small persistent left pleural effusion with overlying atelectasis. 4. Stable advanced cirrhotic changes and the sequela of such. Aortic Atherosclerosis (ICD10-I70.0).  08-12-17: ct of abdomen and pelvis: 1. Heterogeneous prostate, significantly decreased in size in the interval. No residual measurable prostatic abscess. 2. No residual measurable abscess in the visualized left posterior chest wall at the site of the percutaneous drain. 3. No new focal abdominopelvic fluid collections. 4. Cirrhosis. Subcentimeter hypodense segment 6 right liver  lobe lesion, stable since 07/25/2017. Recommend attention on follow-up MRI abdomen without and with IV contrast in 3-6 months. 5. Moderate volume ascites, increased. Stable small paraumbilical and moderate perirectal varices. 6. Stable mild upper retroperitoneal adenopathy, nonspecific, probably reactive. 7. Stable small dependent left pleural effusion . 8. Cholelithiasis. No evidence of acute cholecystitis. No biliary ductal dilatation. 9. Nonobstructing bilateral nephrolithiasis. 10.  Aortic Atherosclerosis  NO NEW EXAMS  LABS REVIEWED: PREVIOUS:   07-24-17: wbc 17.9; hgb 15.4; hct 44.8; mcv 92.9; plt 195; glucose 498; bun 10; creat 0.95; k+ 5.6; na++123; ca 8.2; BC MSSA; HIV: nr; hgb a1c 14.5 07-25-17: wbc 15.4; hgb 14.5; hct 42.7; mcv 92.9; plt 129; glucose 83; bun 12; creat 0.61; k+ 3.1; na++ 129; ca 7.98: urine culture: MSSA 07-27-17: wbc 8.1; hgb 12.4; hct 26.2; mcv 93.2; plt 136;  07-28-17: glucose 231; bun 8; creat 0.70; k+ 3.4; n++ 130; ca 7.3 07-31-17: glucose 154; bun 4.9; creat 0.51; k+ 3.9; na++ 134; ca 7.8; alt 60; ast 130; alk phos 183; albumin 1.8  08-04-17: wbc 6.4; hgb 10.4; hct 29.7; mcv 88.1 plt 103; glucose 121; bun 5; creat 0.67; k+ 3.9; na+ 131; ca 7.9; ast 92; albumin 1.5; blood culture: no  growth; hgb a1c 12.8; urine culture: yeast 08-07-17: glucose 137; bun 5; creat 0.56; k+ 3.1; na++ 134; ca 7.4 08-09-17: glucose 171; bun 6; creat 0.6; k+ 3.1; na++ 134; ca 7.3 08-13-17: glucose 119; bun 6; creat 0.69; k+ 4.0; na++ 134; ca 7.8; mag 1.6; albumin 1.5  NO NEW LABS   Review of Systems  Constitutional: Negative for malaise/fatigue.  Respiratory: Negative for cough and shortness of breath.   Cardiovascular: Negative for chest pain, palpitations and leg swelling.  Gastrointestinal: Negative for abdominal pain, constipation and heartburn.  Musculoskeletal: Negative for back pain, joint pain and myalgias.  Skin: Negative.   Neurological: Negative for dizziness.  Psychiatric/Behavioral: The patient has insomnia. The patient is not nervous/anxious.    Physical Exam  Constitutional: He is oriented to person, place, and time. No distress.  Thin   Eyes: Conjunctivae are normal.  Neck: Neck supple. No thyromegaly present.  Cardiovascular: Regular rhythm, normal heart sounds and intact distal pulses.  Pulmonary/Chest: Effort normal and breath sounds normal. No respiratory distress.  Abdominal: Soft. Bowel sounds are normal. He exhibits no distension. There is no tenderness.  Musculoskeletal: Normal range of motion. He exhibits no edema.  Lymphadenopathy:    He has no cervical adenopathy.  Neurological: He is alert and oriented to person, place, and time.  Skin: Skin is warm and dry. He is not diaphoretic.  Psychiatric: He has a normal mood and affect.    ASSESSMENT/ PLAN:  1.  Depression with anxiety: is stable; will change lexapro 10 mg nightly to help with lethargy during the day; will use melatonin 1 mg nightly and will monitor     MD is aware of resident's narcotic use and is in agreement with current plan of care. We will attempt to wean resident as apropriate     Ok Edwards NP [facility]  Contact [phone] Monday through Friday 8am- 5pm  After hours  call [phone]

## 2017-09-07 ENCOUNTER — Encounter: Payer: Self-pay | Admitting: Adult Health

## 2017-09-07 ENCOUNTER — Other Ambulatory Visit: Payer: Self-pay

## 2017-09-07 ENCOUNTER — Non-Acute Institutional Stay (SKILLED_NURSING_FACILITY): Payer: Self-pay | Admitting: Adult Health

## 2017-09-07 DIAGNOSIS — L299 Pruritus, unspecified: Secondary | ICD-10-CM

## 2017-09-07 MED ORDER — OXYCODONE HCL 5 MG PO TABS: 5.0000 mg | ORAL_TABLET | Freq: Three times a day (TID) | ORAL | 0 refills | Status: DC | PRN

## 2017-09-07 NOTE — Progress Notes (Signed)
Location:   [place] Room Number: [place] of Service:  SNF (31)   CODE STATUS: DNR  No Known Allergies  Chief Complaint  Patient presents with  . Acute Visit    Itching    HPI:  He is complaining of itching all over. He tells me that he has had been itching for the past several days. He has tried lotion with little effect. He tells me that his itching is interfering with his quality of life. His mag level returned low at 1.4 he is on mag ox at this time. There are no reports of missed doses.    Past Medical History:  Diagnosis Date  . Heart murmur    "born w/one"  . Type II diabetes mellitus ([place])     Past Surgical History:  Procedure Laterality Date  . TEE WITHOUT CARDIOVERSION N/A 08/10/2017   Procedure: TRANSESOPHAGEAL ECHOCARDIOGRAM (TEE);  Surgeon: Lelon Perla, MD;  Location: [place];  Service: Cardiovascular;  Laterality: N/A;  . TONSILLECTOMY  1982  . TRANSURETHRAL RESECTION OF PROSTATE N/A 07/26/2017   Procedure: TRANSURETHRAL RESECTION OF THE PROSTATE (TURP) WITH UPROOFING OF PROSTATE ABSCESS;  Surgeon: Irine Seal, MD;  Location: [facility];  Service: Urology;  Laterality: N/A;    Social History   Social History  . Marital status: Divorced    Spouse name: N/A  . Number of children: 3  . Years of education: N/A   Occupational History  . Truck Geophysicist/field seismologist    Social History Main Topics  . Smoking status: Current Every Day Smoker    Packs/day: 0.50    Years: 32.00    Types: Cigarettes  . Smokeless tobacco: Never Used  . Alcohol use 12.6 oz/week    21 Cans of beer per week     Comment: "drink qd"  . Drug use: Yes    Types: Cocaine     Comment: 08/04/2017 "nothing since ~ 02/2017"  . Sexual activity: Not Currently   Other Topics Concern  . Not on file   Social History Narrative   Lives with mom   Family History  Problem Relation Age of Onset  . Hyperlipidemia Mother   . Diabetes Mother   . Hyperlipidemia Father   .  Diabetes Father       VITAL SIGNS BP (!) 146/90   Pulse 69   Temp 97.8 F (36.6 C)   Resp 20   Ht _0  (1.753 m)   Wt 150 lb (68 kg)   SpO2 99%   BMI 22.15 kg/m    Patient's Medications  New Prescriptions   No medications on file  Previous Medications   ATORVASTATIN (LIPITOR) 20 MG TABLET    Take 20 mg by mouth daily.   CEFAZOLIN (ANCEF) IVPB    Inject 2 g into the vein every 8 (eight) hours. Indication:  MSSA bacteremia Last Day of Therapy:  September 16, 2017 Labs - Once weekly:  CBC/D and BMP, Labs - Every other week:  ESR and CRP   DIPHENHYDRAMINE (BENADRYL) 25 MG TABLET    Take 25 mg by mouth every 6 (six) hours as needed for itching.   ESCITALOPRAM (LEXAPRO) 10 MG TABLET    Take 10 mg by mouth daily.   FUROSEMIDE (LASIX) 20 MG TABLET    Take 1 tablet (20 mg total) by mouth daily.   HYDROCORTISONE CREAM 1 %    Apply to back topically two times a day for rash   INSULIN ASPART (NOVOLOG  FLEXPEN) 100 UNIT/ML FLEXPEN    Inject 10 Units into the skin 3 (three) times daily with meals. Hold for CBG less than or = 150.  Notify MD for CBG < 70 or > 500   INSULIN GLARGINE (LANTUS) 100 UNIT/ML SOPN    Inject 24 Units into the skin at bedtime.   LISINOPRIL (PRINIVIL,ZESTRIL) 10 MG TABLET    Take 10 mg by mouth daily.   MAGNESIUM OXIDE (MAG-OX) 400 MG TABLET    Take 400 mg by mouth daily.   METFORMIN (GLUCOPHAGE) 1000 MG TABLET    Take 1 tablet (1,000 mg total) by mouth 2 (two) times daily. COMPLETE PHYSICAL EXAM WITH LABS REQUIRED FOR ADDITIONAL REFILLS   METOPROLOL TARTRATE (LOPRESSOR) 25 MG TABLET    Take 0.5 tablets (12.5 mg total) by mouth 2 (two) times daily.   OXYCODONE (OXY IR/ROXICODONE) 5 MG IMMEDIATE RELEASE TABLET    Take 5 mg by mouth every 8 (eight) hours as needed for severe pain.   POLYETHYLENE GLYCOL (MIRALAX / GLYCOLAX) PACKET    Take 17 g by mouth daily.   SPIRONOLACTONE (ALDACTONE) 25 MG TABLET    Take 25 mg by mouth daily.  Modified Medications   No medications on  file  Discontinued Medications   No medications on file     SIGNIFICANT DIAGNOSTIC EXAMS   PREVIOUS:   07-24-17: chest x-ray: No acute abnormality. No evidence of pneumonia.  07-25-17: pelvic ultrasound: 1. Possible posterior bladder wall mass, for example image 13, transitional cell carcinoma is not excluded and cystoscopy is recommended. 2. Moderate enlargement the prostate gland at 69 cubic cm.  07-25-17: ct of abdomen and pelvis: 1. Organized fluid collections within the prostate and soft tissue musculature, highly worrisome for abscesses. 2. Striated appearance of both kidneys, most indicative of pyelonephritis. Difficult to exclude developing abscesses. 3. Debris in the bladder.  Difficult to exclude a mass. 4. Right middle lobe nodule. Metastatic disease is not excluded. Continued attention on follow-up is recommended. 5. Cirrhosis with moderate ascites. 6. Small left pleural effusion with collapse/consolidation in the left lower lobe. 7. Bilateral renal stones. 8. Cholelithiasis. 9.  Aortic atherosclerosis   07-27-17: ct of chest: 1. Moderate size left pleural effusion. Small amount air may be within this fluid collection and continued followup is recommended. 2. Opacity posteriorly in the left lower lobe consistent with atelectasis or pneumonia. 3. 8 mm pleural-based opacity in the anterior right middle lobe. Cannot exclude neoplasm. Recommend CT of the chest is followup in 4-6 months. 4. Possible hepatic lesion anteriorly in the left lobe. Consider ultrasound or preferably MRI to assess further. Ascites in the upper abdomen. 5. Aberrant right subclavian artery.  08-04-17: chest x-ray: Right upper extremity PICC with tip at the upper cavoatrial junction. No evidence of active cardiopulmonary disease.  08-04-17: ct of chest: Large fluid collection is seen tracking along the lateral and posterior portion of the left chest wall most consistent with abscess. Its inferior component  measures 8.2 x 3.6 cm, and more superior subscapular component measures 5.3 x 2.8 cm. Stable 8 mm right middle lobe nodule is noted. Non-contrast chest CT at 6-12 months is recommended. I  08-10-17: ct of chest: 1. Resolution of left chest wall abscess. 2. Interval decrease in size of the right chest wall abscess. 3. Small persistent left pleural effusion with overlying atelectasis. 4. Stable advanced cirrhotic changes and the sequela of such. Aortic Atherosclerosis (ICD10-I70.0).  08-12-17: ct of abdomen and pelvis: 1. Heterogeneous prostate, significantly decreased  in size in the interval. No residual measurable prostatic abscess. 2. No residual measurable abscess in the visualized left posterior chest wall at the site of the percutaneous drain. 3. No new focal abdominopelvic fluid collections. 4. Cirrhosis. Subcentimeter hypodense segment 6 right liver lobe lesion, stable since 07/25/2017. Recommend attention on follow-up MRI abdomen without and with IV contrast in 3-6 months. 5. Moderate volume ascites, increased. Stable small paraumbilical and moderate perirectal varices. 6. Stable mild upper retroperitoneal adenopathy, nonspecific, probably reactive. 7. Stable small dependent left pleural effusion . 8. Cholelithiasis. No evidence of acute cholecystitis. No biliary ductal dilatation. 9. Nonobstructing bilateral nephrolithiasis. 10.  Aortic Atherosclerosis  NO NEW EXAMS  LABS REVIEWED: PREVIOUS:   07-24-17: wbc 17.9; hgb 15.4; hct 44.8; mcv 92.9; plt 195; glucose 498; bun 10; creat 0.95; k+ 5.6; na++123; ca 8.2; BC MSSA; HIV: nr; hgb a1c 14.5 07-25-17: wbc 15.4; hgb 14.5; hct 42.7; mcv 92.9; plt 129; glucose 83; bun 12; creat 0.61; k+ 3.1; na++ 129; ca 7.98: urine culture: MSSA 07-27-17: wbc 8.1; hgb 12.4; hct 26.2; mcv 93.2; plt 136;  07-28-17: glucose 231; bun 8; creat 0.70; k+ 3.4; n++ 130; ca 7.3 07-31-17: glucose 154; bun 4.9; creat 0.51; k+ 3.9; na++ 134; ca 7.8; alt 60; ast 130; alk  phos 183; albumin 1.8  08-04-17: wbc 6.4; hgb 10.4; hct 29.7; mcv 88.1 plt 103; glucose 121; bun 5; creat 0.67; k+ 3.9; na+ 131; ca 7.9; ast 92; albumin 1.5; blood culture: no growth; hgb a1c 12.8; urine culture: yeast 08-07-17: glucose 137; bun 5; creat 0.56; k+ 3.1; na++ 134; ca 7.4 08-09-17: glucose 171; bun 6; creat 0.6; k+ 3.1; na++ 134; ca 7.3 08-13-17: glucose 119; bun 6; creat 0.69; k+ 4.0; na++ 134; ca 7.8; mag 1.6; albumin 1.5  TODAY:   09-06-17: mag 1.4     Review of Systems  Constitutional: Negative for malaise/fatigue.  Respiratory: Negative for cough and shortness of breath.   Cardiovascular: Negative for chest pain, palpitations and leg swelling.  Gastrointestinal: Negative for abdominal pain, constipation and heartburn.  Musculoskeletal: Negative for back pain, joint pain and myalgias.  Skin: Positive for itching.  Neurological: Negative for dizziness.  Psychiatric/Behavioral: The patient is not nervous/anxious.      Physical Exam  Constitutional: He is oriented to person, place, and time. He appears well-developed and well-nourished. No distress.  Thin   Neck: Normal range of motion. Neck supple. No thyromegaly present.  Cardiovascular: Normal rate, regular rhythm, normal heart sounds and intact distal pulses.  Pulmonary/Chest: Effort normal and breath sounds normal. No respiratory distress.  Abdominal: Soft. Bowel sounds are normal. He exhibits no distension. There is no tenderness.  Musculoskeletal: Normal range of motion. He exhibits no edema.  Lymphadenopathy:    He has no cervical adenopathy.  Neurological: He is alert and oriented to person, place, and time.  Skin: Skin is warm and dry. No rash noted. He is not diaphoretic.  Psychiatric: He has a normal mood and affect.    ASSESSMENT/ PLAN:  1. Pruritis: will begin benadryl 25 mg every 6 hours as needed for 14 days and will monitor  2. Hypomagnesemia: mag level 1.4; ca 7.8: will begin tums 1 gm twice daily  for calcium supplement and will increase mag ox 400 mg four times daily    MD is aware of resident's narcotic use and is in agreement with current plan of care. We will attempt to wean resident as apropriate   Ok Edwards NP [facility]  Contact [phone] Monday through Friday 8am- 5pm  After hours call [phone]

## 2017-09-07 NOTE — Telephone Encounter (Signed)
RX faxed to [facility] @ [phone], phone number [phone] 

## 2017-09-14 ENCOUNTER — Non-Acute Institutional Stay (SKILLED_NURSING_FACILITY): Payer: Self-pay | Admitting: Adult Health

## 2017-09-14 ENCOUNTER — Encounter: Payer: Self-pay | Admitting: Adult Health

## 2017-09-14 DIAGNOSIS — Z794 Long term (current) use of insulin: Secondary | ICD-10-CM

## 2017-09-14 DIAGNOSIS — E1169 Type 2 diabetes mellitus with other specified complication: Secondary | ICD-10-CM

## 2017-09-14 DIAGNOSIS — A4101 Sepsis due to Methicillin susceptible Staphylococcus aureus: Secondary | ICD-10-CM

## 2017-09-14 DIAGNOSIS — I10 Essential (primary) hypertension: Secondary | ICD-10-CM

## 2017-09-14 DIAGNOSIS — E785 Hyperlipidemia, unspecified: Secondary | ICD-10-CM

## 2017-09-14 DIAGNOSIS — E118 Type 2 diabetes mellitus with unspecified complications: Secondary | ICD-10-CM

## 2017-09-14 NOTE — Progress Notes (Signed)
Location:   [place] Room Number: [place] of Service:  SNF (31)    CODE STATUS: DNR  No Known Allergies  Chief Complaint  Patient presents with  . Discharge Note    Discharging to home 09/17/17    HPI:  He is being discharged to home with home health for RN. He will not need any dme. He will need for his prescriptions to be written and will need to follow up with hie medical provider.  He has been hospitalized for sepsis; prostate abscess; back abscess. He was admitted to this facility for short term rehab and IV abt. He is now ready to return back home.    Past Medical History:  Diagnosis Date  . Heart murmur    "born w/one"  . Type II diabetes mellitus ([place])     Past Surgical History:  Procedure Laterality Date  . TONSILLECTOMY  1982    Social History   Socioeconomic History  . Marital status: Divorced    Spouse name: Not on file  . Number of children: 3  . Years of education: Not on file  . Highest education level: Not on file  Social Needs  . Financial resource strain: Not on file  . Food insecurity - worry: Not on file  . Food insecurity - inability: Not on file  . Transportation needs - medical: Not on file  . Transportation needs - non-medical: Not on file  Occupational History  . Occupation: Truck Geophysicist/field seismologist  Tobacco Use  . Smoking status: Current Every Day Smoker    Packs/day: 0.50    Years: 32.00    Pack years: 16.00    Types: Cigarettes  . Smokeless tobacco: Never Used  Substance and Sexual Activity  . Alcohol use: Yes    Alcohol/week: 12.6 oz    Types: 21 Cans of beer per week    Comment: "drink qd"  . Drug use: Yes    Types: Cocaine    Comment: 08/04/2017 "nothing since ~ 02/2017"  . Sexual activity: Not Currently  Other Topics Concern  . Not on file  Social History Narrative   Lives with mom   Family History  Problem Relation Age of Onset  . Hyperlipidemia Mother   . Diabetes Mother   . Hyperlipidemia Father   .  Diabetes Father     VITAL SIGNS BP (!) 101/58   Pulse (!) 55   Temp (!) 96.7 F (35.9 C)   Resp 18   Ht _0  (1.753 m)   Wt 150 lb (68 kg)   SpO2 98%   BMI 22.15 kg/m      Medication List        Accurate as of 09/14/17 11:42 AM. Always use your most recent med list.          atorvastatin 20 MG tablet Commonly known as:  LIPITOR   calcium elemental as carbonate 400 MG chewable tablet Commonly known as:  BARIATRIC TUMS ULTRA   ceFAZolin IVPB Commonly known as:  ANCEF Inject 2 g into the vein every 8 (eight) hours. Indication:  MSSA bacteremia Last Day of Therapy:  September 16, 2017 Labs - Once weekly:  CBC/D and BMP, Labs - Every other week:  ESR and CRP   diphenhydrAMINE 25 MG tablet Commonly known as:  BENADRYL   escitalopram 10 MG tablet Commonly known as:  LEXAPRO   furosemide 20 MG tablet Commonly known as:  LASIX Take 1 tablet (20 mg total) by mouth  daily.   hydrocortisone cream 1 %   insulin glargine 100 unit/mL Sopn Commonly known as:  LANTUS   lisinopril 10 MG tablet Commonly known as:  PRINIVIL,ZESTRIL   magnesium oxide 400 MG tablet Commonly known as:  MAG-OX   metFORMIN 1000 MG tablet Commonly known as:  GLUCOPHAGE Take 1 tablet (1,000 mg total) by mouth 2 (two) times daily. COMPLETE PHYSICAL EXAM WITH LABS REQUIRED FOR ADDITIONAL REFILLS   metoprolol tartrate 25 MG tablet Commonly known as:  LOPRESSOR Take 0.5 tablets (12.5 mg total) by mouth 2 (two) times daily.   NOVOLOG FLEXPEN 100 UNIT/ML FlexPen Generic drug:  insulin aspart   oxyCODONE 5 MG immediate release tablet Commonly known as:  Oxy IR/ROXICODONE Take 1 tablet (5 mg total) by mouth every 8 (eight) hours as needed for severe pain.   polyethylene glycol packet Commonly known as:  MIRALAX / GLYCOLAX   spironolactone 25 MG tablet Commonly known as:  ALDACTONE        SIGNIFICANT DIAGNOSTIC EXAMS  PREVIOUS:   07-24-17: chest x-ray: No acute abnormality. No evidence  of pneumonia.  07-25-17: pelvic ultrasound: 1. Possible posterior bladder wall mass, for example image 13, transitional cell carcinoma is not excluded and cystoscopy is recommended. 2. Moderate enlargement the prostate gland at 69 cubic cm.  07-25-17: ct of abdomen and pelvis: 1. Organized fluid collections within the prostate and soft tissue musculature, highly worrisome for abscesses. 2. Striated appearance of both kidneys, most indicative of pyelonephritis. Difficult to exclude developing abscesses. 3. Debris in the bladder.  Difficult to exclude a mass. 4. Right middle lobe nodule. Metastatic disease is not excluded. Continued attention on follow-up is recommended. 5. Cirrhosis with moderate ascites. 6. Small left pleural effusion with collapse/consolidation in the left lower lobe. 7. Bilateral renal stones. 8. Cholelithiasis. 9.  Aortic atherosclerosis   07-27-17: ct of chest: 1. Moderate size left pleural effusion. Small amount air may be within this fluid collection and continued followup is recommended. 2. Opacity posteriorly in the left lower lobe consistent with atelectasis or pneumonia. 3. 8 mm pleural-based opacity in the anterior right middle lobe. Cannot exclude neoplasm. Recommend CT of the chest is followup in 4-6 months. 4. Possible hepatic lesion anteriorly in the left lobe. Consider ultrasound or preferably MRI to assess further. Ascites in the upper abdomen. 5. Aberrant right subclavian artery.  08-04-17: chest x-ray: Right upper extremity PICC with tip at the upper cavoatrial junction. No evidence of active cardiopulmonary disease.  08-04-17: ct of chest: Large fluid collection is seen tracking along the lateral and posterior portion of the left chest wall most consistent with abscess. Its inferior component measures 8.2 x 3.6 cm, and more superior subscapular component measures 5.3 x 2.8 cm. Stable 8 mm right middle lobe nodule is noted. Non-contrast chest CT at 6-12 months  is recommended. I  08-10-17: ct of chest: 1. Resolution of left chest wall abscess. 2. Interval decrease in size of the right chest wall abscess. 3. Small persistent left pleural effusion with overlying atelectasis. 4. Stable advanced cirrhotic changes and the sequela of such. Aortic Atherosclerosis (ICD10-I70.0).  08-12-17: ct of abdomen and pelvis: 1. Heterogeneous prostate, significantly decreased in size in the interval. No residual measurable prostatic abscess. 2. No residual measurable abscess in the visualized left posterior chest wall at the site of the percutaneous drain. 3. No new focal abdominopelvic fluid collections. 4. Cirrhosis. Subcentimeter hypodense segment 6 right liver lobe lesion, stable since 07/25/2017. Recommend attention on follow-up MRI abdomen  without and with IV contrast in 3-6 months. 5. Moderate volume ascites, increased. Stable small paraumbilical and moderate perirectal varices. 6. Stable mild upper retroperitoneal adenopathy, nonspecific, probably reactive. 7. Stable small dependent left pleural effusion . 8. Cholelithiasis. No evidence of acute cholecystitis. No biliary ductal dilatation. 9. Nonobstructing bilateral nephrolithiasis. 10.  Aortic Atherosclerosis  NO NEW EXAMS  LABS REVIEWED: PREVIOUS:   07-24-17: wbc 17.9; hgb 15.4; hct 44.8; mcv 92.9; plt 195; glucose 498; bun 10; creat 0.95; k+ 5.6; na++123; ca 8.2; BC MSSA; HIV: nr; hgb a1c 14.5 07-25-17: wbc 15.4; hgb 14.5; hct 42.7; mcv 92.9; plt 129; glucose 83; bun 12; creat 0.61; k+ 3.1; na++ 129; ca 7.98: urine culture: MSSA 07-27-17: wbc 8.1; hgb 12.4; hct 26.2; mcv 93.2; plt 136;  07-28-17: glucose 231; bun 8; creat 0.70; k+ 3.4; n++ 130; ca 7.3 07-31-17: glucose 154; bun 4.9; creat 0.51; k+ 3.9; na++ 134; ca 7.8; alt 60; ast 130; alk phos 183; albumin 1.8  08-04-17: wbc 6.4; hgb 10.4; hct 29.7; mcv 88.1 plt 103; glucose 121; bun 5; creat 0.67; k+ 3.9; na+ 131; ca 7.9; ast 92; albumin 1.5; blood culture:  no growth; hgb a1c 12.8; urine culture: yeast 08-07-17: glucose 137; bun 5; creat 0.56; k+ 3.1; na++ 134; ca 7.4 08-09-17: glucose 171; bun 6; creat 0.6; k+ 3.1; na++ 134; ca 7.3 08-13-17: glucose 119; bun 6; creat 0.69; k+ 4.0; na++ 134; ca 7.8; mag 1.6; albumin 1.5 09-06-17: mag 1.4    NO NEW LABS    Review of Systems  Constitutional: Negative for malaise/fatigue.  Respiratory: Negative for cough and shortness of breath.   Cardiovascular: Negative for chest pain, palpitations and leg swelling.  Gastrointestinal: Negative for abdominal pain, constipation and heartburn.  Musculoskeletal: Negative for back pain, joint pain and myalgias.  Skin: Negative.   Neurological: Negative for dizziness.  Psychiatric/Behavioral: The patient is not nervous/anxious.    Physical Exam  Constitutional: He is oriented to person, place, and time. He appears well-developed. No distress.  Thin   Neck: No thyromegaly present.  Cardiovascular: Normal rate, regular rhythm and intact distal pulses.  Pulmonary/Chest: Effort normal and breath sounds normal. No respiratory distress.  Abdominal: Soft. Bowel sounds are normal. He exhibits no distension. There is no tenderness.  Musculoskeletal: Normal range of motion. He exhibits no edema.  Lymphadenopathy:    He has no cervical adenopathy.  Neurological: He is alert and oriented to person, place, and time.  Skin: Skin is warm and dry. He is not diaphoretic.    ASSESSMENT/ PLAN:  Patient is being discharged with the following home health services:  RN to evaluate and treat as indicated for medication management   Patient is being discharged with the following durable medical equipment: none needed    Patient has been advised to f/u with their PCP in 1-2 weeks to bring them up to date on their rehab stay.  Social services at facility was responsible for arranging this appointment.  Pt was provided with a 30 day supply of prescriptions for medications and  refills must be obtained from their PCP.  For controlled substances, a more limited supply may be provided adequate until PCP appointment only.   Prescriptions have been written for a 30 days supply of his non-narcotics per his medication list as above Narcotic prescriptions have been written as follows:  #60 klonopin 0.5 mg tabs #20 oxycodone 5 mg tabs.   Time spent with patient and discharge process: 40 minutes: we have discussed his  home health needs and what to expect; discuss his medications and narcotics; he has verbalized understanding.    Ok Edwards NP [facility]  Contact [phone] Monday through Friday 8am- 5pm  After hours call [phone]

## 2017-09-15 DIAGNOSIS — F418 Other specified anxiety disorders: Secondary | ICD-10-CM | POA: Insufficient documentation

## 2017-09-26 DIAGNOSIS — L299 Pruritus, unspecified: Secondary | ICD-10-CM | POA: Insufficient documentation

## 2017-10-06 ENCOUNTER — Telehealth: Payer: Self-pay | Admitting: *Deleted

## 2017-10-06 NOTE — Telephone Encounter (Signed)
Called wanting a refill on Oxycodone 5mg  since Cameron Coffey prescribed.  Explained to him that he has been discharged from facility since 11/6 and he would have to call his PCP for any refills. He agreed.

## 2018-01-24 ENCOUNTER — Ambulatory Visit: Payer: Self-pay | Admitting: Internal Medicine

## 2018-01-24 ENCOUNTER — Telehealth: Payer: Self-pay | Admitting: Internal Medicine

## 2018-01-24 NOTE — Telephone Encounter (Signed)
Copied from CRM [id number]. Topic: General - Other >> Jan 24, 2018  8:50 AM Elliot GaultBell, Tiffany M wrote: Relation to pt: self  Call back [phone] Pharmacy: [id number] - [place], [place] - [address]  Reason for call:  Patient was scheduled today with Rene Kocheregina and app[facility] was [facility], patient requesting 2 pills of oxyCODONE (OXY IR/ROXICODONE) 5 MG immediate release tablet to hold him over. Patient states he's in a lot of pain, please advise

## 2018-01-24 NOTE — Telephone Encounter (Signed)
Pt is aware that he will be unable receive a refill without office visit and can not guarantee that it will be refilled tomorrow at [facility] that is up to the provider, he expressed understanding

## 2018-01-25 ENCOUNTER — Ambulatory Visit: Payer: Self-pay | Admitting: Internal Medicine

## 2018-01-25 VITALS — BP 142/90 | HR 72 | Temp 97.8°F | Wt 159.0 lb

## 2018-01-25 DIAGNOSIS — E785 Hyperlipidemia, unspecified: Secondary | ICD-10-CM

## 2018-01-25 DIAGNOSIS — M545 Low back pain, unspecified: Secondary | ICD-10-CM

## 2018-01-25 DIAGNOSIS — E1165 Type 2 diabetes mellitus with hyperglycemia: Secondary | ICD-10-CM

## 2018-01-25 DIAGNOSIS — E1169 Type 2 diabetes mellitus with other specified complication: Secondary | ICD-10-CM

## 2018-01-25 DIAGNOSIS — I10 Essential (primary) hypertension: Secondary | ICD-10-CM

## 2018-01-25 DIAGNOSIS — F418 Other specified anxiety disorders: Secondary | ICD-10-CM

## 2018-01-25 LAB — COMPREHENSIVE METABOLIC PANEL
ALBUMIN: 3.5 g/dL (ref 3.5–5.2)
ALK PHOS: 231 U/L — AB (ref 39–117)
ALT: 120 U/L — ABNORMAL HIGH (ref 0–53)
AST: 136 U/L — AB (ref 0–37)
BUN: 16 mg/dL (ref 6–23)
CALCIUM: 9.6 mg/dL (ref 8.4–10.5)
CO2: 30 mEq/L (ref 19–32)
CREATININE: 1.01 mg/dL (ref 0.40–1.50)
Chloride: 90 mEq/L — ABNORMAL LOW (ref 96–112)
GFR: 98.23 mL/min (ref >60.00)
Glucose, Bld: 477 mg/dL — ABNORMAL HIGH (ref 70–99)
Potassium: 3.9 mEq/L (ref 3.5–5.1)
Sodium: 128 mEq/L — ABNORMAL LOW (ref 135–145)
Total Bilirubin: 1.5 mg/dL — ABNORMAL HIGH (ref 0.2–1.2)
Total Protein: 9 g/dL — ABNORMAL HIGH (ref 6.0–8.3)

## 2018-01-25 LAB — HEMOGLOBIN A1C: HEMOGLOBIN A1C: 11.2 % — AB (ref 4.6–6.5)

## 2018-01-25 MED ORDER — METHOCARBAMOL 750 MG PO TABS: 750.0000 mg | ORAL_TABLET | Freq: Three times a day (TID) | ORAL | 0 refills | Status: DC | PRN

## 2018-01-25 NOTE — Progress Notes (Signed)
Subjective:    Patient ID: Cameron Coffey, male    DOB: 02-25-1961, 57 y.o.   MRN: [id number]  HPI  Pt presents to the clinic today for follow up of chronic conditions.  DM 2: He has not been seen in > 1 year. He is not testing his sugars. He reports he can not afford the insulin. He only takes his parents Metformin about every 3-4 days. He does not check his feet. He does not get routine eye exams. Flu: 07/2017. Pneumovax: 07/2017  HTN: His BP today is 142/90. He is not taking Furosemide, Lisinopril, Metoprolol or Spironolactone because he can not afford his medication. He denies chest pain but does have some chronic SOB (smoker). ECG from 07/2017 reviewed.  HLD: His cholesterol has not been checked in > 1 year. He is not taking the Atorvastatin. He does not consume a low fat diet.  Anxiety and Depression: Chronic but he reports he is okay off the Lexapro. He reports he hates being cooped up in the house, he knows he can't do things he used to and that really bothers him. He denies SI/HI.  He also reports back pain. He reports the pain is generalized but not located directly over the spine. He describes the pain as sore and achy. The pain is worse with movement. He denies any injury to the area. He has tried Oxycodone but reports he is out of this and would like a refill of this today.   Review of Systems  Past Medical History:  Diagnosis Date  . Heart murmur    "born w/one"  . Type II diabetes mellitus (HCC)     Current Outpatient Medications  Medication Sig Dispense Refill  . calcium elemental as carbonate (BARIATRIC TUMS ULTRA) 400 MG chewable tablet Chew 1,000 mg 2 (two) times daily by mouth.    . diphenhydrAMINE (BENADRYL) 25 MG tablet Take 25 mg by mouth every 6 (six) hours as needed for itching.    . hydrocortisone cream 1 % Apply to back topically two times a day for rash    . metFORMIN (GLUCOPHAGE) 1000 MG tablet Take 1 tablet (1,000 mg total) by mouth 2 (two) times daily.  COMPLETE PHYSICAL EXAM WITH LABS REQUIRED FOR ADDITIONAL REFILLS 60 tablet 0  . atorvastatin (LIPITOR) 20 MG tablet Take 20 mg by mouth daily.    Marland Kitchen escitalopram (LEXAPRO) 10 MG tablet Take 10 mg by mouth daily.    . furosemide (LASIX) 20 MG tablet Take 1 tablet (20 mg total) by mouth daily. (Patient not taking: Reported on 01/25/2018) 30 tablet   . insulin aspart (NOVOLOG FLEXPEN) 100 UNIT/ML FlexPen Inject 10 Units into the skin 3 (three) times daily with meals. Hold for CBG less than or = 150.  Notify MD for CBG < 70 or > 500    . insulin glargine (LANTUS) 100 unit/mL SOPN Inject 24 Units into the skin at bedtime.    Marland Kitchen lisinopril (PRINIVIL,ZESTRIL) 10 MG tablet Take 10 mg by mouth daily.    . metoprolol tartrate (LOPRESSOR) 25 MG tablet Take 0.5 tablets (12.5 mg total) by mouth 2 (two) times daily. (Patient not taking: Reported on 01/25/2018) 60 tablet 0  . spironolactone (ALDACTONE) 25 MG tablet Take 25 mg by mouth daily.     No current facility-administered medications for this visit.     No Known Allergies  Family History  Problem Relation Age of Onset  . Hyperlipidemia Mother   . Diabetes Mother   .  Hyperlipidemia Father   . Diabetes Father     Social History   Socioeconomic History  . Marital status: Divorced    Spouse name: Not on file  . Number of children: 3  . Years of education: Not on file  . Highest education level: Not on file  Social Needs  . Financial resource strain: Not on file  . Food insecurity - worry: Not on file  . Food insecurity - inability: Not on file  . Transportation needs - medical: Not on file  . Transportation needs - non-medical: Not on file  Occupational History  . Occupation: [employer] doctor  Tobacco Use  . Smoking status: Current Every Day Smoker    Packs/day: 0.50    Years: 32.00    Pack years: 16.00    Types: Cigarettes  . Smokeless tobacco: Never Used  Substance and Sexual Activity  . Alcohol use: Yes    Alcohol/week: 12.6 oz     Types: 21 Cans of beer per week    Comment: "drink qd"  . Drug use: Yes    Types: Cocaine    Comment: 08/04/2017 "nothing since ~ 02/2017"  . Sexual activity: Not Currently  Other Topics Concern  . Not on file  Social History Narrative   Lives with mom     Constitutional: Denies fever, malaise, fatigue, headache or abrupt weight changes.  HEENT: Denies eye pain, eye redness, ear pain, ringing in the ears, wax buildup, runny nose, nasal congestion, bloody nose, or sore throat. Respiratory: Pt reports shortness of breath. Denies difficulty breathing, cough or sputum production.   Cardiovascular: Denies chest pain, chest tightness, palpitations or swelling in the hands or feet.  Gastrointestinal: Denies abdominal pain, bloating, constipation, diarrhea or blood in the stool.  GU: Denies urgency, frequency, pain with urination, burning sensation, blood in urine, odor or discharge. Musculoskeletal: Pt reports back pain. Denies decrease in range of motion, difficulty with gait, or joint pain and swelling.  Skin: Denies redness, rashes, lesions or ulcercations.  Neurological: Denies dizziness, difficulty with memory, difficulty with speech or problems with balance and coordination.  Psych: Pt has a history of anxiety and depression. Denies SI/HI.  No other specific complaints in a complete review of systems (except as listed in HPI above).     Objective:   Physical Exam   BP (!) 142/90   Pulse 72   Temp 97.8 F (36.6 C) (Oral)   Wt 159 lb (72.1 kg)   SpO2 98%   BMI 23.48 kg/m  Wt Readings from Last 3 Encounters:  01/25/18 159 lb (72.1 kg)  09/14/17 150 lb (68 kg)  09/07/17 150 lb (68 kg)    General: Appears older than his stated age, chronically ill appearing in NAD. Skin: Warm, dry and intact. No ulcerations noted. Cardiovascular: Normal rate and rhythm. Murmur noted. Pulmonary/Chest: Normal effort and positive vesicular breath sounds. No respiratory distress. No wheezes, rales  or ronchi noted.  Abdomen: Soft and nontender. No CVA tenderness noted Musculoskeletal: Normal flexion. Decreased extension and rotation. No bony tenderness noted over the spine. Pain with palpation of the lumbar paraspinal muscles. Using cane for assistance with gait. Neurological: Alert and oriented. Sensation intact to BLE.  Psychiatric: Mood and affect normal. Behavior is normal. Judgment and thought content normal.    BMET    Component Value Date/Time   NA 138 08/23/2017   K 3.9 08/23/2017   CL 102 08/13/2017 0317   CO2 28 08/13/2017 0317   GLUCOSE 119 (H)  08/13/2017 0317   BUN 6 08/23/2017   CREATININE 0.6 08/23/2017   CREATININE 0.69 08/13/2017 0317   CALCIUM 7.8 (L) 08/13/2017 0317   GFRNONAA >60 08/13/2017 0317   GFRAA >60 08/13/2017 0317    Lipid Panel     Component Value Date/Time   CHOL 110 08/24/2017   TRIG 61 08/24/2017   HDL 43 08/24/2017   CHOLHDL NOT CALCULATED 07/25/2017 0308   VLDL 16 07/25/2017 0308   LDLCALC 55 08/24/2017    CBC    Component Value Date/Time   WBC 5.4 08/05/2017 0459   RBC 3.23 (L) 08/05/2017 0459   HGB 9.7 (L) 08/05/2017 0459   HCT 28.5 (L) 08/05/2017 0459   PLT 106 (L) 08/05/2017 0459   MCV 88.2 08/05/2017 0459   MCH 30.0 08/05/2017 0459   MCHC 34.0 08/05/2017 0459   RDW 15.1 08/05/2017 0459   LYMPHSABS 1.7 08/04/2017 1145   MONOABS 0.6 08/04/2017 1145   EOSABS 0.0 08/04/2017 1145   BASOSABS 0.0 08/04/2017 1145    Hgb A1C Lab Results  Component Value Date   HGBA1C 12.8 (H) 08/04/2017           Assessment & Plan:   Acute Back Pain:  Muscular Discussed stretching and heat Advised him to try Ibuprofen or Aleve OTC- warned him about taking too much NSAID eRx for Robaxin 250 mg prn  Will follow up after labs, return precautions discussed Nicki ReaperBAITY, Coralee Edberg, NP

## 2018-01-25 NOTE — Patient Instructions (Signed)

## 2018-01-26 ENCOUNTER — Other Ambulatory Visit: Payer: Self-pay | Admitting: Internal Medicine

## 2018-01-26 ENCOUNTER — Telehealth: Payer: Self-pay | Admitting: Internal Medicine

## 2018-01-26 DIAGNOSIS — R748 Abnormal levels of other serum enzymes: Secondary | ICD-10-CM

## 2018-01-26 NOTE — Telephone Encounter (Signed)
Copied from CRM [phone]. Topic: Quick Communication - Lab Results >> Jan 26, 2018 12:10 PM Nydia Ytuarte, Tresa EndoKelly, [place] wrote: Patient called to check the status of his lab work he had done yesterday. He would like a call back at [phone]

## 2018-01-26 NOTE — Progress Notes (Signed)
cm

## 2018-01-27 ENCOUNTER — Other Ambulatory Visit: Payer: Self-pay

## 2018-01-28 ENCOUNTER — Other Ambulatory Visit (INDEPENDENT_AMBULATORY_CARE_PROVIDER_SITE_OTHER): Payer: Self-pay

## 2018-01-28 DIAGNOSIS — R748 Abnormal levels of other serum enzymes: Secondary | ICD-10-CM

## 2018-01-28 LAB — COMPREHENSIVE METABOLIC PANEL
ALT: 126 U/L — ABNORMAL HIGH (ref 0–53)
AST: 137 U/L — ABNORMAL HIGH (ref 0–37)
Albumin: 3.5 g/dL (ref 3.5–5.2)
Alkaline Phosphatase: 193 U/L — ABNORMAL HIGH (ref 39–117)
BUN: 13 mg/dL (ref 6–23)
CALCIUM: 9.7 mg/dL (ref 8.4–10.5)
CO2: 26 meq/L (ref 19–32)
Chloride: 93 mEq/L — ABNORMAL LOW (ref 96–112)
Creatinine, Ser: 0.95 mg/dL (ref 0.40–1.50)
GFR: 105.42 mL/min (ref >60.00)
GLUCOSE: 462 mg/dL — AB (ref 70–99)
POTASSIUM: 4.2 meq/L (ref 3.5–5.1)
Sodium: 130 mEq/L — ABNORMAL LOW (ref 135–145)
Total Bilirubin: 1.3 mg/dL — ABNORMAL HIGH (ref 0.2–1.2)
Total Protein: 9.1 g/dL — ABNORMAL HIGH (ref 6.0–8.3)

## 2018-01-29 ENCOUNTER — Encounter: Payer: Self-pay | Admitting: Internal Medicine

## 2018-01-29 NOTE — Assessment & Plan Note (Signed)
A1C today Encouraged him to consume a low fat, low carb diet Will get [facility] involved to help with medication management Encouraged yearly eye exams Foot exam today He reports he can't even afford his own Metformin Flu and pneumovax are UTD

## 2018-01-29 NOTE — Assessment & Plan Note (Signed)
Chronic but stable off meds Will monitor 

## 2018-01-29 NOTE — Assessment & Plan Note (Signed)
Uncontrolled. I wanted to restart his Lisinopril but he reports he can not afford 4$ a month Discussed DASH diet Discussed risk of heart attack, stroke or death We will try to get [facility] involved.

## 2018-01-29 NOTE — Assessment & Plan Note (Signed)
CMET and Lipid profile today Encouraged him to consume a low fat diet He reports he can not afford his Atorvastatin [facility]

## 2018-02-01 ENCOUNTER — Other Ambulatory Visit: Payer: Self-pay | Admitting: Internal Medicine

## 2018-02-01 DIAGNOSIS — R894 Abnormal immunological findings in specimens from other organs, systems and tissues: Secondary | ICD-10-CM

## 2018-02-01 DIAGNOSIS — R768 Other specified abnormal immunological findings in serum: Secondary | ICD-10-CM

## 2018-02-01 DIAGNOSIS — E1110 Type 2 diabetes mellitus with ketoacidosis without coma: Secondary | ICD-10-CM

## 2018-02-01 MED ORDER — GLIPIZIDE 10 MG PO TABS
10.0000 mg | ORAL_TABLET | Freq: Two times a day (BID) | ORAL | 3 refills | Status: AC
Start: 2018-02-01 — End: ?

## 2018-02-01 MED ORDER — METFORMIN HCL 1000 MG PO TABS: 1000.0000 mg | ORAL_TABLET | Freq: Two times a day (BID) | ORAL | 2 refills | Status: DC

## 2018-02-02 ENCOUNTER — Telehealth: Payer: Self-pay | Admitting: Internal Medicine

## 2018-02-02 NOTE — Telephone Encounter (Signed)
Ok. Did he mention anything about being able to afford the Metformin and Glipizide?

## 2018-02-02 NOTE — Telephone Encounter (Signed)
Pt. Called back to report his fasting finger stick glucose - 280 this morning.

## 2018-02-03 LAB — HCV RNA,QUANTITATIVE REAL TIME PCR
HCV QUANT LOG: 6.26 {Log_IU}/mL — AB
HCV RNA, PCR, QN: 1800000 [IU]/mL — AB

## 2018-02-03 LAB — CMV ABS, IGG+IGM (CYTOMEGALOVIRUS)
CMV IgM: <30 AU/mL
CYTOMEGALOVIRUS AB-IGG: 9.4 U/mL — AB

## 2018-02-03 LAB — HEPATITIS PANEL, ACUTE
HEP A IGM: NONREACTIVE
HEP B C IGM: NONREACTIVE
HEP C AB: REACTIVE — AB
Hepatitis B Surface Ag: NONREACTIVE
SIGNAL TO CUT-OFF: 32.5 — AB (ref <1.00)

## 2018-02-04 ENCOUNTER — Telehealth: Payer: Self-pay

## 2018-02-04 DIAGNOSIS — R748 Abnormal levels of other serum enzymes: Secondary | ICD-10-CM

## 2018-02-04 DIAGNOSIS — B182 Chronic viral hepatitis C: Secondary | ICD-10-CM

## 2018-02-04 NOTE — Addendum Note (Signed)
Addended by: Lorre MunroeBAITY, REGINA W on: 02/04/2018 02:34 PM   Modules accepted: Orders

## 2018-02-04 NOTE — Telephone Encounter (Signed)
Referral placed.

## 2018-02-04 NOTE — Telephone Encounter (Signed)
  Dawn Drazek, NP--[facility] called and wanted to let you know that based on pt;s labs and past labs, this is a chronic issue not an acute and being that the pt does not have insurance, it would be best for pt to be referred to [facility]... Most likely Cirrhosis and needs to have a CT scan every 6 months... Please advise

## 2018-02-18 ENCOUNTER — Telehealth: Payer: Self-pay | Admitting: Internal Medicine

## 2018-02-18 ENCOUNTER — Other Ambulatory Visit: Payer: Self-pay | Admitting: Internal Medicine

## 2018-02-18 NOTE — Telephone Encounter (Signed)
He should be see for reevaluation if no improvement.

## 2018-02-18 NOTE — Telephone Encounter (Signed)
Pt is aware must make an appt to be reevaluated. Pt said he may go to er

## 2018-02-18 NOTE — Telephone Encounter (Signed)
Copied from CRM [id number]. Topic: Quick Communication - Rx Refill/Question >> Feb 18, 2018 11:22 AM Diana EvesHoyt, Maryann B wrote: Medication: methocarbamol (ROBAXIN) 750 MG tablet  Pt states the pain hasnt gotten any better. He states he is still having sharp pains and it is traveling up and down his leg.    Preferred Pharmacy (with phone number or street name[id number] - [place], [place] - [address] Agent: Please be advised that RX refills may take up to 3 business days. We ask that you follow-up with your pharmacy.

## 2018-03-07 ENCOUNTER — Ambulatory Visit: Payer: Self-pay | Admitting: Gastroenterology

## 2018-03-16 ENCOUNTER — Ambulatory Visit: Payer: Self-pay | Admitting: Gastroenterology

## 2018-03-18 ENCOUNTER — Ambulatory Visit: Payer: Self-pay | Admitting: Gastroenterology

## 2018-08-20 ENCOUNTER — Other Ambulatory Visit: Payer: Self-pay

## 2018-08-20 ENCOUNTER — Emergency Department
Admission: EM | Admit: 2018-08-20 | Discharge: 2018-08-20 | Disposition: A | Discharge status: Home / Self Care | Payer: Medicaid Other | Attending: Emergency Medicine | Admitting: Emergency Medicine

## 2018-08-20 ENCOUNTER — Encounter: Payer: Self-pay | Admitting: Emergency Medicine

## 2018-08-20 DIAGNOSIS — Z79899 Other long term (current) drug therapy: Secondary | ICD-10-CM | POA: Insufficient documentation

## 2018-08-20 DIAGNOSIS — Z794 Long term (current) use of insulin: Secondary | ICD-10-CM | POA: Insufficient documentation

## 2018-08-20 DIAGNOSIS — E1165 Type 2 diabetes mellitus with hyperglycemia: Secondary | ICD-10-CM | POA: Diagnosis not present

## 2018-08-20 DIAGNOSIS — F17210 Nicotine dependence, cigarettes, uncomplicated: Secondary | ICD-10-CM | POA: Diagnosis not present

## 2018-08-20 DIAGNOSIS — F1020 Alcohol dependence, uncomplicated: Secondary | ICD-10-CM | POA: Insufficient documentation

## 2018-08-20 DIAGNOSIS — K7010 Alcoholic hepatitis without ascites: Secondary | ICD-10-CM | POA: Insufficient documentation

## 2018-08-20 DIAGNOSIS — R799 Abnormal finding of blood chemistry, unspecified: Secondary | ICD-10-CM | POA: Diagnosis present

## 2018-08-20 DIAGNOSIS — R739 Hyperglycemia, unspecified: Secondary | ICD-10-CM

## 2018-08-20 DIAGNOSIS — K861 Other chronic pancreatitis: Secondary | ICD-10-CM | POA: Diagnosis not present

## 2018-08-20 DIAGNOSIS — I10 Essential (primary) hypertension: Secondary | ICD-10-CM | POA: Insufficient documentation

## 2018-08-20 LAB — COMPREHENSIVE METABOLIC PANEL
ALBUMIN: 3.2 g/dL — AB (ref 3.5–5.0)
ALT: 143 U/L — AB (ref 0–44)
AST: 191 U/L — AB (ref 15–41)
Alkaline Phosphatase: 276 U/L — ABNORMAL HIGH (ref 38–126)
Anion gap: 13 (ref 5–15)
BUN: 11 mg/dL (ref 6–20)
CHLORIDE: 89 mmol/L — AB (ref 98–111)
CO2: 25 mmol/L (ref 22–32)
CREATININE: 0.86 mg/dL (ref 0.61–1.24)
Calcium: 9.5 mg/dL (ref 8.9–10.3)
GFR calc non Af Amer: >60 mL/min (ref >60)
GLUCOSE: 543 mg/dL — AB (ref 70–99)
Potassium: 4.1 mmol/L (ref 3.5–5.1)
SODIUM: 127 mmol/L — AB (ref 135–145)
Total Bilirubin: 1.8 mg/dL — ABNORMAL HIGH (ref 0.3–1.2)
Total Protein: 8.7 g/dL — ABNORMAL HIGH (ref 6.5–8.1)

## 2018-08-20 LAB — PROTIME-INR
INR: 0.98
PROTHROMBIN TIME: 12.9 s (ref 11.4–15.2)

## 2018-08-20 LAB — CBC
HCT: 43.8 % (ref 39.0–52.0)
Hemoglobin: 14.9 g/dL (ref 13.0–17.0)
MCH: 30.7 pg (ref 26.0–34.0)
MCHC: 34 g/dL (ref 30.0–36.0)
MCV: 90.3 fL (ref 80.0–100.0)
NRBC: 0 % (ref 0.0–0.2)
PLATELETS: 95 10*3/uL — AB (ref 150–400)
RBC: 4.85 MIL/uL (ref 4.22–5.81)
RDW: 13.1 % (ref 11.5–15.5)
WBC: 4.1 10*3/uL (ref 4.0–10.5)

## 2018-08-20 LAB — LIPASE, BLOOD: Lipase: 121 U/L — ABNORMAL HIGH (ref 11–51)

## 2018-08-20 MED ORDER — SODIUM CHLORIDE 0.9 % IV BOLUS
1000.0000 mL | Freq: Once | INTRAVENOUS | Status: DC
Start: 2018-08-20 — End: 2018-08-20

## 2018-08-20 NOTE — ED Triage Notes (Addendum)
Pt to ed with c/o abnormal labs, unsure of exactly what but thinks "it may be liver enzymes or something wrong with my liver".  States he was told to come to ED.

## 2018-08-20 NOTE — ED Notes (Signed)
Pt refused peripheral IV placement and NS bolus. After discussing lab results with EDP, Pt expressed desire to go home and follow up with his PCM at the scheduled appointment on Monday.

## 2018-08-20 NOTE — ED Provider Notes (Signed)
[facility] Emergency Department Provider Note  ____________________________________________   First MD Initiated Contact with Patient 08/20/18 1708     (approximate)  I have reviewed the triage vital signs and the nursing notes.   HISTORY  Chief Complaint abnormal labs  HPI Cameron Coffey is a 57 y.o. male who was sent to the emergency department today because of abnormal liver labs.  However, the patient does not know which lab was abnormal and he says that he has no complaints.  He says that he is aware that his labs are always abnormal because of his chronic drinking.  he denies any abdominal pain, nausea vomiting or diarrhea. Denies any blood in the stool. Says that he just started with a new doctor this past Thursday and thinks that they're adjusting his labs for the first time and this is why he was sent to the emergency department. He says that he drinks approximately 12-24 beers per day. Says that he sometimes takes insulin but is supposed be taking it all the time.   Past Medical History:  Diagnosis Date  . Heart murmur    "born w/one"  . Type II diabetes mellitus [facility])     Patient Active Problem List   Diagnosis Date Noted  . Depression with anxiety 09/15/2017  . Dyslipidemia associated with type 2 diabetes mellitus (HCC) 07/30/2017  . Type II diabetes with long term use of insulin (HCC) 07/30/2017  . Essential hypertension 05/28/2015    Past Surgical History:  Procedure Laterality Date  . TEE WITHOUT CARDIOVERSION N/A 08/10/2017   Procedure: TRANSESOPHAGEAL ECHOCARDIOGRAM (TEE);  Surgeon: Lewayne Bunting, MD;  [facility];  Service: Cardiovascular;  Laterality: N/A;  . TONSILLECTOMY  1982  . TRANSURETHRAL RESECTION OF PROSTATE N/A 07/26/2017   Procedure: TRANSURETHRAL RESECTION OF THE PROSTATE (TURP) WITH UPROOFING OF PROSTATE ABSCESS;  Surgeon: Bjorn Pippin, MD;  Location: [facility] ORS;  Service: Urology;  Laterality: N/A;     Prior to Admission medications   Medication Sig Start Date End Date Taking? Authorizing Provider  atorvastatin (LIPITOR) 20 MG tablet Take 20 mg by mouth daily. 07/30/17   [provider]  calcium elemental as carbonate (BARIATRIC TUMS ULTRA) 400 MG chewable tablet Chew 1,000 mg 2 (two) times daily by mouth. 09/07/17   [provider]  diphenhydrAMINE (BENADRYL) 25 MG tablet Take 25 mg by mouth every 6 (six) hours as needed for itching.    [provider]  escitalopram (LEXAPRO) 10 MG tablet Take 10 mg by mouth daily. 08/18/17   [provider]  furosemide (LASIX) 20 MG tablet Take 1 tablet (20 mg total) by mouth daily. Patient not taking: Reported on 01/25/2018 08/14/17   Randel Pigg, Dorma Russell, MD  glipiZIDE (GLUCOTROL) 10 MG tablet Take 1 tablet (10 mg total) by mouth 2 (two) times daily before a meal. 02/01/18   Baity, Salvadore Oxford, NP  hydrocortisone cream 1 % Apply to back topically two times a day for rash    [provider]  insulin aspart (NOVOLOG FLEXPEN) 100 UNIT/ML FlexPen Inject 10 Units into the skin 3 (three) times daily with meals. Hold for CBG less than or = 150.  Notify MD for CBG < 70 or > 500    [provider]  insulin glargine (LANTUS) 100 unit/mL SOPN Inject 24 Units into the skin at bedtime.    [provider]  lisinopril (PRINIVIL,ZESTRIL) 10 MG tablet Take 10 mg by mouth daily.    [provider]  metFORMIN (GLUCOPHAGE) 1000 MG tablet Take 1 tablet (1,000 mg total) by mouth 2 (two) times daily. COMPLETE PHYSICAL EXAM WITH LABS REQUIRED FOR ADDITIONAL REFILLS 02/01/18   Lorre Munroe, NP  methocarbamol (ROBAXIN) 750 MG tablet Take 1 tablet (750 mg total) by mouth every 8 (eight) hours as needed for muscle spasms. 01/25/18   Lorre Munroe, NP  metoprolol tartrate (LOPRESSOR) 25 MG tablet Take 0.5 tablets (12.5 mg total) by mouth 2 (two) times daily. Patient not taking: Reported on 01/25/2018 07/29/17    Katha Hamming, MD  spironolactone (ALDACTONE) 25 MG tablet Take 25 mg by mouth daily. 08/03/17   [provider]    Allergies Patient has no known allergies.  Family History  Problem Relation Age of Onset  . Hyperlipidemia Mother   . Diabetes Mother   . Hyperlipidemia Father   . Diabetes Father     Social History Social History   Tobacco Use  . Smoking status: Current Every Day Smoker    Packs/day: 0.50    Years: 32.00    Pack years: 16.00    Types: Cigarettes  . Smokeless tobacco: Never Used  Substance Use Topics  . Alcohol use: Yes    Alcohol/week: 21.0 standard drinks    Types: 21 Cans of beer per week    Comment: "drink qd"  . Drug use: Yes    Types: Cocaine    Comment: last use 1 1/2 weeks ago    Review of Systems Constitutional: No fever/chills Eyes: No visual changes. ENT: No sore throat. Cardiovascular: Denies chest pain. Respiratory: Denies shortness of breath. Gastrointestinal: No abdominal pain.  No nausea, no vomiting.  No diarrhea.  No constipation. Genitourinary: Negative for dysuria. Musculoskeletal: Negative for back pain. Skin: Negative for rash. Neurological: Negative for headaches, focal weakness or numbness.   ____________________________________________   PHYSICAL EXAM:  VITAL SIGNS: ED Triage Vitals  Enc Vitals Group     BP 08/20/18 1531 (!) 112/97     Pulse Rate 08/20/18 1531 (!) 53     Resp 08/20/18 1531 16     Temp 08/20/18 1531 98.2 F (36.8 C)     Temp Source 08/20/18 1531 Oral     SpO2 08/20/18 1531 98 %     Weight 08/20/18 1532 158 lb 15.2 oz (72.1 kg)     Height --      Head Circumference --      Peak Flow --      Pain Score 08/20/18 1531 0     Pain Loc --      Pain Edu? --      Excl. in GC? --     Constitutional: Alert and oriented. Well appearing and in no acute distress. Eyes: Conjunctivae are normal.  Head: Atraumatic. Nose: No congestion/rhinnorhea. Mouth/Throat: Mucous membranes are moist.   Neck: No stridor.   Cardiovascular: Normal rate, regular rhythm. Grossly normal heart sounds.  Respiratory: Normal respiratory effort.  No retractions. Lungs CTAB. Gastrointestinal: Soft and nontender. No distention.  Musculoskeletal: No lower extremity tenderness nor edema.  No joint effusions. Neurologic:  Normal speech and language. No gross focal neurologic deficits are appreciated. Skin:  Skin is warm, dry and intact. No rash noted. Psychiatric: Mood and affect are normal. Speech and behavior are normal.  ____________________________________________   LABS (all labs ordered are listed, but only abnormal results are displayed)  Labs Reviewed  CBC - Abnormal; Notable for the following components:      Result Value  Platelets 95 (*)    All other components within normal limits  COMPREHENSIVE METABOLIC PANEL - Abnormal; Notable for the following components:   Sodium 127 (*)    Chloride 89 (*)    Glucose, Bld 543 (*)    Total Protein 8.7 (*)    Albumin 3.2 (*)    AST 191 (*)    ALT 143 (*)    Alkaline Phosphatase 276 (*)    Total Bilirubin 1.8 (*)    All other components within normal limits  LIPASE, BLOOD - Abnormal; Notable for the following components:   Lipase 121 (*)    All other components within normal limits  PROTIME-INR   ____________________________________________  EKG  ED ECG REPORT I, Arelia Longest, the attending physician, personally viewed and interpreted this ECG.   Date: 08/20/2018  EKG Time: 1753  Rate: 49  Rhythm: sinus bradycardia  Axis: normal  Intervals:none  ST&T Change: no ST segment elevation or depression. T-wave inversions in V2 and V3 with biphasic T waves in V4 through 5. likely wandering baseline causing machine to read patient with minimal ST elevation in the inferior leads. ____________________________________________  RADIOLOGY   ____________________________________________   PROCEDURES  Procedure(s) performed:    Procedures  Critical Care performed:   ____________________________________________   INITIAL IMPRESSION / ASSESSMENT AND PLAN / ED COURSE  Pertinent labs & imaging results that were available during my care of the patient were reviewed by me and considered in my medical decision making (see chart for details).  DX: Alcohol hepatitis, elevated INR, chronic pancreatitis, alcoholism, hypoglycemia As part of my medical decision making, I reviewed the following data within the electronic MEDICAL RECORD NUMBER Notes from prior ED visits  ----------------------------------------- 6:57 PM on 08/20/2018 -----------------------------------------  Patient with new EKG changes but without any chest pain or shortness of breath. Hyperglycemia. Patient does not want further workup. Says that he feels fine at this time and says that he has an appointment this Monday with his primary care doctor. He is unsure of why he was sent to the hospital. The patient does appear to have multiple organ systems are affected by his diabetes as well as drinking. However, he is not interested in having further workup at this time. Also with prolonged QT interval but appears to have borderline prolonged QT interval in the past. Patient will continue metformin at home. Will be following up with his primary care doctor this coming Monday. ____________________________________________   FINAL CLINICAL IMPRESSION(S) / ED DIAGNOSES  Final diagnoses:  Alcoholism (HCC)  Alcoholic hepatitis, unspecified whether ascites present  Hyperglycemia  Chronic pancreatitis, unspecified pancreatitis type (HCC)      NEW MEDICATIONS STARTED DURING THIS VISIT:  New Prescriptions   No medications on file     Note:  This document was prepared using Dragon voice recognition software and may include unintentional dictation errors.     Myrna Blazer, MD 08/20/18 [phone]

## 2018-11-02 ENCOUNTER — Other Ambulatory Visit: Payer: Self-pay

## 2018-11-02 ENCOUNTER — Encounter (HOSPITAL_COMMUNITY): Payer: Self-pay

## 2018-11-02 ENCOUNTER — Emergency Department (HOSPITAL_COMMUNITY)
Admission: EM | Admit: 2018-11-02 | Discharge: 2018-11-03 | Disposition: A | Discharge status: Home / Self Care | Payer: Medicaid Other | Attending: Emergency Medicine | Admitting: Emergency Medicine

## 2018-11-02 DIAGNOSIS — R531 Weakness: Secondary | ICD-10-CM

## 2018-11-02 DIAGNOSIS — E1165 Type 2 diabetes mellitus with hyperglycemia: Secondary | ICD-10-CM | POA: Diagnosis not present

## 2018-11-02 DIAGNOSIS — H6501 Acute serous otitis media, right ear: Secondary | ICD-10-CM | POA: Diagnosis not present

## 2018-11-02 DIAGNOSIS — Z79899 Other long term (current) drug therapy: Secondary | ICD-10-CM | POA: Diagnosis not present

## 2018-11-02 DIAGNOSIS — F17210 Nicotine dependence, cigarettes, uncomplicated: Secondary | ICD-10-CM | POA: Insufficient documentation

## 2018-11-02 DIAGNOSIS — R739 Hyperglycemia, unspecified: Secondary | ICD-10-CM

## 2018-11-02 DIAGNOSIS — I10 Essential (primary) hypertension: Secondary | ICD-10-CM | POA: Diagnosis not present

## 2018-11-02 DIAGNOSIS — M7918 Myalgia, other site: Secondary | ICD-10-CM | POA: Diagnosis present

## 2018-11-02 DIAGNOSIS — R634 Abnormal weight loss: Secondary | ICD-10-CM | POA: Insufficient documentation

## 2018-11-02 DIAGNOSIS — Z794 Long term (current) use of insulin: Secondary | ICD-10-CM | POA: Diagnosis not present

## 2018-11-02 LAB — CBC WITH DIFFERENTIAL/PLATELET
Abs Immature Granulocytes: 0.01 10*3/uL (ref 0.00–0.07)
Basophils Absolute: 0 10*3/uL (ref 0.0–0.1)
Basophils Relative: 0 %
Eosinophils Absolute: 0 10*3/uL (ref 0.0–0.5)
Eosinophils Relative: 0 %
HCT: 42.1 % (ref 39.0–52.0)
Hemoglobin: 13.9 g/dL (ref 13.0–17.0)
Immature Granulocytes: 0 %
Lymphocytes Relative: 29 %
Lymphs Abs: 1.5 10*3/uL (ref 0.7–4.0)
MCH: 29.8 pg (ref 26.0–34.0)
MCHC: 33 g/dL (ref 30.0–36.0)
MCV: 90.1 fL (ref 80.0–100.0)
Monocytes Absolute: 1 10*3/uL (ref 0.1–1.0)
Monocytes Relative: 19 %
Neutro Abs: 2.5 10*3/uL (ref 1.7–7.7)
Neutrophils Relative %: 52 %
Platelets: UNDETERMINED 10*3/uL (ref 150–400)
RBC: 4.67 MIL/uL (ref 4.22–5.81)
RDW: 13.3 % (ref 11.5–15.5)
WBC: 5 10*3/uL (ref 4.0–10.5)
nRBC: 0 % (ref 0.0–0.2)

## 2018-11-02 LAB — COMPREHENSIVE METABOLIC PANEL
ALT: 149 U/L — AB (ref 0–44)
ANION GAP: 14 (ref 5–15)
AST: 201 U/L — ABNORMAL HIGH (ref 15–41)
Albumin: 2.8 g/dL — ABNORMAL LOW (ref 3.5–5.0)
Alkaline Phosphatase: 143 U/L — ABNORMAL HIGH (ref 38–126)
BUN: 7 mg/dL (ref 6–20)
CO2: 25 mmol/L (ref 22–32)
CREATININE: 0.92 mg/dL (ref 0.61–1.24)
Calcium: 8.6 mg/dL — ABNORMAL LOW (ref 8.9–10.3)
Chloride: 88 mmol/L — ABNORMAL LOW (ref 98–111)
Glucose, Bld: 767 mg/dL (ref 70–99)
POTASSIUM: 5.3 mmol/L — AB (ref 3.5–5.1)
SODIUM: 127 mmol/L — AB (ref 135–145)
Total Bilirubin: 2.2 mg/dL — ABNORMAL HIGH (ref 0.3–1.2)
Total Protein: 8.3 g/dL — ABNORMAL HIGH (ref 6.5–8.1)

## 2018-11-02 LAB — CBG MONITORING, ED
Glucose-Capillary: >600 mg/dL (ref 70–99)
Glucose-Capillary: 516 mg/dL (ref 70–99)
Glucose-Capillary: 312 mg/dL — ABNORMAL HIGH (ref 70–99)
Glucose-Capillary: 174 mg/dL — ABNORMAL HIGH (ref 70–99)
Glucose-Capillary: 274 mg/dL — ABNORMAL HIGH (ref 70–99)

## 2018-11-02 LAB — URINALYSIS, ROUTINE W REFLEX MICROSCOPIC
Bilirubin Urine: NEGATIVE
Glucose, UA: >=500 mg/dL — AB
Hgb urine dipstick: NEGATIVE
Ketones, ur: 20 mg/dL — AB
Leukocytes, UA: NEGATIVE
Nitrite: NEGATIVE
Protein, ur: NEGATIVE mg/dL
Specific Gravity, Urine: 1.028 (ref 1.005–1.030)
pH: 7 (ref 5.0–8.0)

## 2018-11-02 LAB — INFLUENZA PANEL BY PCR (TYPE A & B)
INFLAPCR: NEGATIVE
INFLBPCR: NEGATIVE

## 2018-11-02 LAB — CK: Total CK: 147 U/L (ref 49–397)

## 2018-11-02 MED ORDER — MORPHINE SULFATE (PF) 4 MG/ML IV SOLN
4.0000 mg | Freq: Once | INTRAVENOUS | Status: AC
  Administered 2018-11-02: 4 mg via INTRAVENOUS
  Filled 2018-11-02: qty 1

## 2018-11-02 MED ORDER — SODIUM CHLORIDE 0.9 % IV BOLUS
1000.0000 mL | Freq: Once | INTRAVENOUS | Status: AC
  Administered 2018-11-02: 1000 mL via INTRAVENOUS

## 2018-11-02 MED ORDER — INSULIN REGULAR(HUMAN) IN NACL 100-0.9 UT/100ML-% IV SOLN
INTRAVENOUS | Status: DC
  Administered 2018-11-02: 5.4 [IU]/h via INTRAVENOUS
  Filled 2018-11-02: qty 100

## 2018-11-02 MED ORDER — DEXTROSE-NACL 5-0.45 % IV SOLN
INTRAVENOUS | Status: DC
  Administered 2018-11-02: 21:00:00 via INTRAVENOUS

## 2018-11-02 MED ORDER — AMOXICILLIN 500 MG PO CAPS
500.0000 mg | ORAL_CAPSULE | Freq: Once | ORAL | Status: AC
  Administered 2018-11-02: 500 mg via ORAL
  Filled 2018-11-02: qty 1

## 2018-11-02 MED ORDER — AMOXICILLIN 500 MG PO CAPS: 500.0000 mg | ORAL_CAPSULE | Freq: Three times a day (TID) | ORAL | 0 refills | Status: DC

## 2018-11-02 MED ORDER — SODIUM CHLORIDE 0.9 % IV SOLN
INTRAVENOUS | Status: DC
  Administered 2018-11-02: 19:00:00 via INTRAVENOUS

## 2018-11-02 NOTE — ED Provider Notes (Addendum)
[facility] EMERGENCY DEPARTMENT Provider Note   CSN: [id number] Arrival date & time: 11/02/18  1553     History   Chief Complaint Chief Complaint  Patient presents with  . Generalized Body Aches    HPI Cameron Coffey is a 57 y.o. male.  The history is provided by the patient. No language interpreter was used.     57 year old male with history of diabetes, depression, anxiety, hypertension presenting with body aches.  Patient report for the past few months he has noticed a lot of weight loss.  He mention he was staying at his friend's house yesterday and he felt very weak after he was sitting on the couch and was having difficulty getting out of the couch throughout the day.  He complaining of sinus congestion, body aches, chills, occasional cough.  He also complained of right ear pain that started since yesterday.  He has had his flu shot.  He has been compliant with his medication.  He feels thirsty and hungry.  He admits to occasional alcohol and marijuana use.  He has history of diabetes.  He does not complain of any significant shortness of breath or dysuria.  Past Medical History:  Diagnosis Date  . Heart murmur    "born w/one"  . Type II diabetes mellitus [facility])     Patient Active Problem List   Diagnosis Date Noted  . Depression with anxiety 09/15/2017  . Dyslipidemia associated with type 2 diabetes mellitus (HCC) 07/30/2017  . Type II diabetes with long term use of insulin (HCC) 07/30/2017  . Essential hypertension 05/28/2015    Past Surgical History:  Procedure Laterality Date  . TEE WITHOUT CARDIOVERSION N/A 08/10/2017   Procedure: TRANSESOPHAGEAL ECHOCARDIOGRAM (TEE);  Surgeon: Lewayne Bunting, MD;  Location: [facility] ENDOSCOPY;  Service: Cardiovascular;  Laterality: N/A;  . TONSILLECTOMY  1982  . TRANSURETHRAL RESECTION OF PROSTATE N/A 07/26/2017   Procedure: TRANSURETHRAL RESECTION OF THE PROSTATE (TURP) WITH UPROOFING OF PROSTATE ABSCESS;   Surgeon: Bjorn Pippin, MD;  Location: [facility] ORS;  Service: Urology;  Laterality: N/A;        Home Medications    Prior to Admission medications   Medication Sig Start Date End Date Taking? Authorizing Provider  atorvastatin (LIPITOR) 20 MG tablet Take 20 mg by mouth daily. 07/30/17   [provider]  calcium elemental as carbonate (BARIATRIC TUMS ULTRA) 400 MG chewable tablet Chew 1,000 mg 2 (two) times daily by mouth. 09/07/17   [provider]  diphenhydrAMINE (BENADRYL) 25 MG tablet Take 25 mg by mouth every 6 (six) hours as needed for itching.    [provider]  escitalopram (LEXAPRO) 10 MG tablet Take 10 mg by mouth daily. 08/18/17   [provider]  furosemide (LASIX) 20 MG tablet Take 1 tablet (20 mg total) by mouth daily. Patient not taking: Reported on 01/25/2018 08/14/17   Randel Pigg, Dorma Russell, MD  glipiZIDE (GLUCOTROL) 10 MG tablet Take 1 tablet (10 mg total) by mouth 2 (two) times daily before a meal. 02/01/18   Baity, Salvadore Oxford, NP  hydrocortisone cream 1 % Apply to back topically two times a day for rash    [provider]  insulin aspart (NOVOLOG FLEXPEN) 100 UNIT/ML FlexPen Inject 10 Units into the skin 3 (three) times daily with meals. Hold for CBG less than or = 150.  Notify MD for CBG < 70 or > 500    [provider]  insulin glargine (LANTUS) 100 unit/mL SOPN  Inject 24 Units into the skin at bedtime.    [provider]  lisinopril (PRINIVIL,ZESTRIL) 10 MG tablet Take 10 mg by mouth daily.    [provider]  metFORMIN (GLUCOPHAGE) 1000 MG tablet Take 1 tablet (1,000 mg total) by mouth 2 (two) times daily. COMPLETE PHYSICAL EXAM WITH LABS REQUIRED FOR ADDITIONAL REFILLS 02/01/18   Lorre MunroeBaity, Regina W, NP  methocarbamol (ROBAXIN) 750 MG tablet Take 1 tablet (750 mg total) by mouth every 8 (eight) hours as needed for muscle spasms. 01/25/18   Lorre MunroeBaity, Regina W, NP  metoprolol tartrate (LOPRESSOR) 25 MG tablet Take 0.5  tablets (12.5 mg total) by mouth 2 (two) times daily. Patient not taking: Reported on 01/25/2018 07/29/17   Katha HammingKonidena, Snehalatha, MD  spironolactone (ALDACTONE) 25 MG tablet Take 25 mg by mouth daily. 08/03/17   [provider]    Family History Family History  Problem Relation Age of Onset  . Hyperlipidemia Mother   . Diabetes Mother   . Hyperlipidemia Father   . Diabetes Father     Social History Social History   Tobacco Use  . Smoking status: Current Every Day Smoker    Packs/day: 0.50    Years: 32.00    Pack years: 16.00    Types: Cigarettes  . Smokeless tobacco: Never Used  Substance Use Topics  . Alcohol use: Yes    Alcohol/week: 21.0 standard drinks    Types: 21 Cans of beer per week    Comment: "drink qd"  . Drug use: Yes    Types: Cocaine    Comment: last use 1 1/2 weeks ago     Allergies   Patient has no known allergies.   Review of Systems Review of Systems  All other systems reviewed and are negative.    Physical Exam Updated Vital Signs BP (!) 149/95   Pulse 80   Temp 98.9 F (37.2 C) (Oral)   Resp 14   Ht 5\' 9"  (1.753 m)   Wt 59 kg   SpO2 100%   BMI 19.20 kg/m   Physical Exam Vitals signs and nursing note reviewed.  Constitutional:      General: He is not in acute distress.    Appearance: He is well-developed.  HENT:     Head: Atraumatic.     Comments: Ears: Right TM is erythematous and mildly bulging.  Left TM is normal Nose: Normal nares Throat: Uvula midline no tonsillar swelling exudates, no trismus     Mouth/Throat:     Mouth: Mucous membranes are moist.  Eyes:     Extraocular Movements: Extraocular movements intact.     Conjunctiva/sclera: Conjunctivae normal.     Pupils: Pupils are equal, round, and reactive to light.  Neck:     Musculoskeletal: Neck supple. No neck rigidity.  Cardiovascular:     Rate and Rhythm: Normal rate and regular rhythm.     Heart sounds: Murmur present.  Pulmonary:     Effort:  Pulmonary effort is normal.     Breath sounds: Normal breath sounds. No wheezing, rhonchi or rales.  Abdominal:     Palpations: Abdomen is soft.     Tenderness: There is no abdominal tenderness.  Skin:    Findings: No rash.  Neurological:     Mental Status: He is alert and oriented to person, place, and time.      ED Treatments / Results  Labs (all labs ordered are listed, but only abnormal results are displayed) Labs Reviewed  COMPREHENSIVE METABOLIC  PANEL - Abnormal; Notable for the following components:      Result Value   Sodium 127 (*)    Potassium 5.3 (*)    Chloride 88 (*)    Glucose, Bld 767 (*)    Calcium 8.6 (*)    Total Protein 8.3 (*)    Albumin 2.8 (*)    AST 201 (*)    ALT 149 (*)    Alkaline Phosphatase 143 (*)    Total Bilirubin 2.2 (*)    All other components within normal limits  URINALYSIS, ROUTINE W REFLEX MICROSCOPIC - Abnormal; Notable for the following components:   Color, Urine STRAW (*)    Glucose, UA >=500 (*)    Ketones, ur 20 (*)    Bacteria, UA RARE (*)    All other components within normal limits  CBG MONITORING, ED - Abnormal; Notable for the following components:   Glucose-Capillary >600 (*)    All other components within normal limits  CBG MONITORING, ED - Abnormal; Notable for the following components:   Glucose-Capillary 516 (*)    All other components within normal limits  CBG MONITORING, ED - Abnormal; Notable for the following components:   Glucose-Capillary 312 (*)    All other components within normal limits  CBG MONITORING, ED - Abnormal; Notable for the following components:   Glucose-Capillary 174 (*)    All other components within normal limits  CBG MONITORING, ED - Abnormal; Notable for the following components:   Glucose-Capillary 274 (*)    All other components within normal limits  CBC WITH DIFFERENTIAL/PLATELET  CK  INFLUENZA PANEL BY PCR (TYPE A & B)    EKG None  Radiology No results  found.  Procedures .Critical Care Performed by: Fayrene Helperran, Brandom Kerwin, PA-C Authorized by: Fayrene Helperran, Cire Deyarmin, PA-C   Critical care provider statement:    Critical care time (minutes):  45   Critical care was time spent personally by me on the following activities:  Discussions with consultants, evaluation of patient's response to treatment, examination of patient, ordering and performing treatments and interventions, ordering and review of laboratory studies, ordering and review of radiographic studies, pulse oximetry, re-evaluation of patient's condition, obtaining history from patient or surrogate and review of old charts   (including critical care time)  Medications Ordered in ED Medications  dextrose 5 %-0.45 % sodium chloride infusion ( Intravenous New Bag/Given 11/02/18 2120)  insulin regular, human (MYXREDLIN) 100 units/ 100 mL infusion (1.1 Units/hr Intravenous Rate/Dose Change 11/02/18 2113)  sodium chloride 0.9 % bolus 1,000 mL (0 mLs Intravenous Stopped 11/02/18 1900)    And  0.9 %  sodium chloride infusion ( Intravenous New Bag/Given 11/02/18 1901)  sodium chloride 0.9 % bolus 1,000 mL (0 mLs Intravenous Stopped 11/02/18 1743)  morphine 4 MG/ML injection 4 mg (4 mg Intravenous Given 11/02/18 1934)  amoxicillin (AMOXIL) capsule 500 mg (500 mg Oral Given 11/02/18 1930)     Initial Impression / Assessment and Plan / ED Course  I have reviewed the triage vital signs and the nursing notes.  Pertinent labs & imaging results that were available during my care of the patient were reviewed by me and considered in my medical decision making (see chart for details).     BP (!) 141/83   Pulse 78   Temp 98.9 F (37.2 C) (Oral)   Resp 19   Ht 5\' 9"  (1.753 m)   Wt 59 kg   SpO2 100%   BMI 19.20 kg/m    Final  Clinical Impressions(s) / ED Diagnoses   Final diagnoses:  Hyperglycemia  Generalized weakness  Non-recurrent acute serous otitis media of right ear    ED Discharge Orders          Ordered    amoxicillin (AMOXIL) 500 MG capsule  3 times daily     11/02/18 2246         4:10 PM Patient here with flulike symptoms as well as myalgias.  He is on Lipitor which could induce myositis therefore will check CK level as well as flu test.  We will give IV fluid, check basic labs.  10:36 PM Patient was found to be hyperglycemic with CBG greater than 600.  He has normal anion gap.  He has 20 ketones in urine without signs of urinary tract infection.  His total CK is within normal limit.  No evidence of myositis.  He was placed on glucose stabilizer which include IV fluid and insulin.  His symptoms did improve his CBG steadily normalized with last documented CBG at 174.  10:41 PM Nurse report the patient was wobbly when trying to ambulate.  Still has generalized weakness.  However, he does not have any specific reason for hospital admission at this time.  Patient will be discharged home with amoxicillin for his right ear infection.   Fayrene Helper, PA-C 11/02/18 2248    Raeford Razor, MD 11/03/18 1708  ADDENDUM: CC time added   Fayrene Helper, PA-C 11/09/18 1638    Raeford Razor, MD 11/10/18 2246

## 2018-11-02 NOTE — ED Notes (Signed)
Ambulated Pt in hallway Pt was very only took a few steps out of the room. Pt stated he was in pain and felt weak.Also had staggering gate and had to hold on to the wall.

## 2018-11-02 NOTE — Discharge Instructions (Signed)
You have been diagnosed with ear infection.  Take antibiotic as prescribed.  Your blood sugar is high today, check or blood sugar regularly and take your medication.  Rest, drink plenty of fluid and follow up with your doctor for further care.

## 2018-11-02 NOTE — ED Triage Notes (Signed)
Per [facility] pt is complaining of generalized body aches times 1 week. Pt has had dark, foul smelling urine x3 weeks. Pt also reports that he has been non-compliant with his home meds recently. Pt reports fevers at home.

## 2018-11-02 NOTE — ED Notes (Signed)
[facility] called for transport.  

## 2018-11-03 NOTE — ED Notes (Signed)
Pt given coffee, graham crackers, and peanut butter.

## 2018-11-03 NOTE — ED Notes (Signed)
Patient verbalizes understanding of discharge instructions. Opportunity for questioning and answers were provided. Armband removed by staff, pt discharged from ED with [facility].  

## 2018-12-15 ENCOUNTER — Encounter: Payer: Self-pay | Admitting: Emergency Medicine

## 2018-12-15 ENCOUNTER — Emergency Department
Admission: EM | Admit: 2018-12-15 | Discharge: 2018-12-15 | Disposition: A | Discharge status: Home / Self Care | Payer: Medicaid Other | Attending: Emergency Medicine | Admitting: Emergency Medicine

## 2018-12-15 DIAGNOSIS — M791 Myalgia, unspecified site: Secondary | ICD-10-CM | POA: Diagnosis present

## 2018-12-15 DIAGNOSIS — E119 Type 2 diabetes mellitus without complications: Secondary | ICD-10-CM | POA: Diagnosis not present

## 2018-12-15 DIAGNOSIS — R69 Illness, unspecified: Secondary | ICD-10-CM

## 2018-12-15 DIAGNOSIS — F17210 Nicotine dependence, cigarettes, uncomplicated: Secondary | ICD-10-CM | POA: Diagnosis not present

## 2018-12-15 DIAGNOSIS — I10 Essential (primary) hypertension: Secondary | ICD-10-CM | POA: Insufficient documentation

## 2018-12-15 DIAGNOSIS — Z79899 Other long term (current) drug therapy: Secondary | ICD-10-CM | POA: Diagnosis not present

## 2018-12-15 DIAGNOSIS — J111 Influenza due to unidentified influenza virus with other respiratory manifestations: Secondary | ICD-10-CM

## 2018-12-15 DIAGNOSIS — Z794 Long term (current) use of insulin: Secondary | ICD-10-CM | POA: Insufficient documentation

## 2018-12-15 LAB — INFLUENZA PANEL BY PCR (TYPE A & B)
Influenza A By PCR: NEGATIVE
Influenza B By PCR: NEGATIVE

## 2018-12-15 MED ORDER — GUAIFENESIN-CODEINE 100-10 MG/5ML PO SOLN: 5.0000 mL | ORAL | 0 refills | Status: DC | PRN

## 2018-12-15 NOTE — Discharge Instructions (Signed)
Follow-up with your primary care provider if any continued problems.  Continue taking your regular medication and take Tylenol or ibuprofen as needed for body aches or fever.  Your flu test was negative however you do have influenza-like illness and is going to be treated the same.  You also have a prescription sent to your pharmacy for Robitussin-AC.  This medication contains a narcotic and should not be taken while driving or operating machinery.

## 2018-12-15 NOTE — ED Triage Notes (Signed)
Patient presents to the ED with congestion, headache and body aches x 3 days.

## 2018-12-15 NOTE — ED Provider Notes (Signed)
[facility] Emergency Department Provider Note  ____________________________________________   First MD Initiated Contact with Patient 12/15/18 1540     (approximate)  I have reviewed the triage vital signs and the nursing notes.   HISTORY  Chief Complaint Generalized Body Aches; Headache; and Nasal Congestion   HPI Cameron Coffey is a 58 y.o. male presents to the ED with complaint of sudden onset congestion, headache, body aches for 3 days.  Patient states that he has not had any nausea, vomiting or diarrhea.  Patient has not taken any over-the-counter medication.  He states that while he was waiting in the waiting room he walked to the store and back and "felt weak".  Patient states that he is eaten meals today.  He rates his body pain as a 9/10.   Past Medical History:  Diagnosis Date  . Heart murmur    "born w/one"  . Type II diabetes mellitus [facility])     Patient Active Problem List   Diagnosis Date Noted  . Depression with anxiety 09/15/2017  . Dyslipidemia associated with type 2 diabetes mellitus (HCC) 07/30/2017  . Type II diabetes with long term use of insulin (HCC) 07/30/2017  . Essential hypertension 05/28/2015    Past Surgical History:  Procedure Laterality Date  . TEE WITHOUT CARDIOVERSION N/A 08/10/2017   Procedure: TRANSESOPHAGEAL ECHOCARDIOGRAM (TEE);  Surgeon: Lewayne Bunting, MD;  Location: [facility] ENDOSCOPY;  Service: Cardiovascular;  Laterality: N/A;  . TONSILLECTOMY  1982  . TRANSURETHRAL RESECTION OF PROSTATE N/A 07/26/2017   Procedure: TRANSURETHRAL RESECTION OF THE PROSTATE (TURP) WITH UPROOFING OF PROSTATE ABSCESS;  Surgeon: Bjorn Pippin, MD;  Location: [facility] ORS;  Service: Urology;  Laterality: N/A;    Prior to Admission medications   Medication Sig Start Date End Date Taking? Authorizing Provider  amoxicillin (AMOXIL) 500 MG capsule Take 1 capsule (500 mg total) by mouth 3 (three) times daily. 11/02/18   Fayrene Helper, PA-C    atorvastatin (LIPITOR) 20 MG tablet Take 20 mg by mouth daily. 07/30/17   [provider]  escitalopram (LEXAPRO) 10 MG tablet Take 10 mg by mouth daily. 08/18/17   [provider]  furosemide (LASIX) 20 MG tablet Take 1 tablet (20 mg total) by mouth daily. Patient not taking: Reported on 01/25/2018 08/14/17   Randel Pigg, Dorma Russell, MD  glipiZIDE (GLUCOTROL) 10 MG tablet Take 1 tablet (10 mg total) by mouth 2 (two) times daily before a meal. 02/01/18   Baity, Salvadore Oxford, NP  guaiFENesin-codeine 100-10 MG/5ML syrup Take 5 mLs by mouth every 4 (four) hours as needed. 12/15/18   Tommi Rumps, PA-C  lisinopril (PRINIVIL,ZESTRIL) 10 MG tablet Take 10 mg by mouth daily.    [provider]  metFORMIN (GLUCOPHAGE) 1000 MG tablet Take 1 tablet (1,000 mg total) by mouth 2 (two) times daily. COMPLETE PHYSICAL EXAM WITH LABS REQUIRED FOR ADDITIONAL REFILLS Patient taking differently: Take 1,000 mg by mouth 2 (two) times daily.  02/01/18   Lorre Munroe, NP  metoprolol tartrate (LOPRESSOR) 25 MG tablet Take 0.5 tablets (12.5 mg total) by mouth 2 (two) times daily. 07/29/17   Katha Hamming, MD    Allergies Patient has no known allergies.  Family History  Problem Relation Age of Onset  . Hyperlipidemia Mother   . Diabetes Mother   . Hyperlipidemia Father   . Diabetes Father     Social History Social History   Tobacco Use  . Smoking status: Current Every Day Smoker  Packs/day: 0.50    Years: 32.00    Pack years: 16.00    Types: Cigarettes  . Smokeless tobacco: Never Used  Substance Use Topics  . Alcohol use: Yes    Alcohol/week: 6.0 standard drinks    Types: 6 Cans of beer per week    Comment: "drink qd"  . Drug use: Yes    Types: Marijuana    Comment: pt reports last use several months ago.     Review of Systems Constitutional: Subjective fever/chills Eyes: No visual changes. ENT: No sore throat. Cardiovascular: Denies chest pain. Respiratory: Denies  shortness of breath. Gastrointestinal: No abdominal pain.  No nausea, no vomiting.  No diarrhea.  No constipation. Genitourinary: Negative for dysuria. Musculoskeletal: Positive muscle aches. Skin: Negative for rash. Neurological: Positive for headaches, negative for focal weakness or numbness.  ____________________________________________   PHYSICAL EXAM:  VITAL SIGNS: ED Triage Vitals  Enc Vitals Group     BP 12/15/18 1515 (!) 196/106     Pulse Rate 12/15/18 1515 63     Resp 12/15/18 1515 20     Temp 12/15/18 1515 98.2 F (36.8 C)     Temp Source 12/15/18 1515 Oral     SpO2 12/15/18 1515 99 %     Weight 12/15/18 1516 135 lb (61.2 kg)     Height 12/15/18 1516 5\' 9"  (1.753 m)     Head Circumference --      Peak Flow --      Pain Score 12/15/18 1519 9     Pain Loc --      Pain Edu? --      Excl. in GC? --    Constitutional: Alert and oriented. Well appearing and in no acute distress. Eyes: Conjunctivae are normal. PERRL. EOMI. Head: Atraumatic. Nose: No congestion/rhinnorhea.  TMs are clear bilaterally. Mouth/Throat: Mucous membranes are moist.  Oropharynx non-erythematous. Neck: No stridor.   Hematological/Lymphatic/Immunilogical: No cervical lymphadenopathy. Cardiovascular: Normal rate, regular rhythm. Grossly normal heart sounds.  Good peripheral circulation. Respiratory: Normal respiratory effort.  No retractions. Lungs CTAB. Gastrointestinal: Soft and nontender. No distention. Musculoskeletal: Moves upper and lower extremities with any difficulty and patient was noted to be ambulatory to the restroom while in the department without any difficulty. Neurologic:  Normal speech and language. No gross focal neurologic deficits are appreciated. No gait instability. Skin:  Skin is warm, dry and intact. No rash noted. Psychiatric: Mood and affect are normal. Speech and behavior are normal.  ____________________________________________   LABS (all labs ordered are listed,  but only abnormal results are displayed)  Labs Reviewed  INFLUENZA PANEL BY PCR (TYPE A & B)     PROCEDURES  Procedure(s) performed: None  Procedures  Critical Care performed: No  ____________________________________________   INITIAL IMPRESSION / ASSESSMENT AND PLAN / ED COURSE  As part of my medical decision making, I reviewed the following data within the electronic MEDICAL RECORD NUMBER Notes from prior ED visits and [place] Controlled Substance Database  Patient presents to the ED with sudden onset of congestion, headache, body aches for the last 3 days.  He states that the cough is keeping him from being able to get rest at night.  He has not tried any over-the-counter medication.  Although his influenza test was negative physical exam shows he most likely has a flulike illness.  Patient was given a prescription for guaifenesin with codeine as needed for cough and congestion.  Patient is encouraged to drink fluids and take Tylenol or ibuprofen as  needed for body aches, headache or fever.  He is to follow-up with his PCP if any continued problems.  ____________________________________________   FINAL CLINICAL IMPRESSION(S) / ED DIAGNOSES  Final diagnoses:  Influenza-like illness     ED Discharge Orders         Ordered    guaiFENesin-codeine 100-10 MG/5ML syrup  Every 4 hours PRN     12/15/18 1724           Note:  This document was prepared using Dragon voice recognition software and may include unintentional dictation errors.    Tommi Rumps, PA-C 12/15/18 1746    Minna Antis, MD 12/15/18 Barry Brunner

## 2018-12-17 IMAGING — CT CT ABD-PEL WO/W CM
2 of 10 series · 11 of 46 positions shown, 17 images · IV contrast (APPLIED)
Comparison: None.

CLINICAL DATA: Hematuria, weight loss, abdominal pain. Urinary
tract infection.

EXAM:
CT ABDOMEN AND PELVIS WITHOUT AND WITH CONTRAST
TECHNIQUE: Multidetector CT imaging of the abdomen and pelvis was performed
following the standard protocol before and following the bolus
administration of intravenous contrast.
CONTRAST:  150mL UIFFO1-YEE IOPAMIDOL (UIFFO1-YEE) INJECTION 61%

[Series 8: axial delay · axial · delayed · 0.76mm/px · z∈[-1218,-823]mm · 9 of 99 slices shown, 15 images]
[im 10/99  soft-tissue]
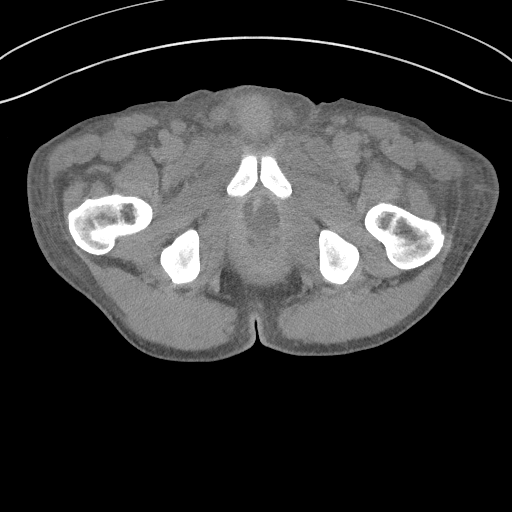
[im 10/99  bone]
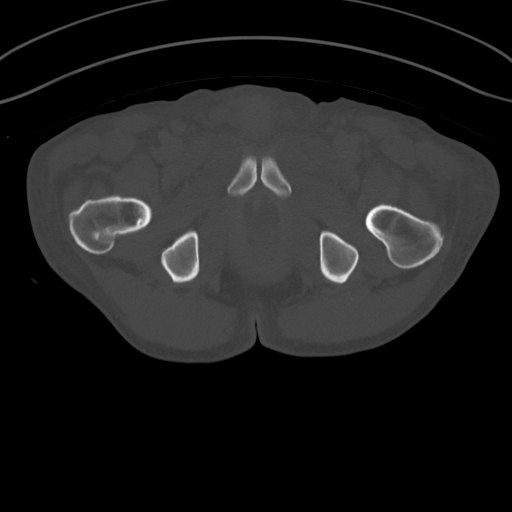
[im 20/99  soft-tissue]
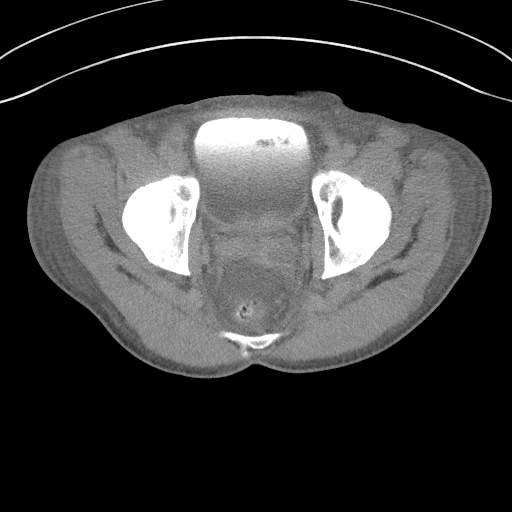
[im 30/99  soft-tissue]
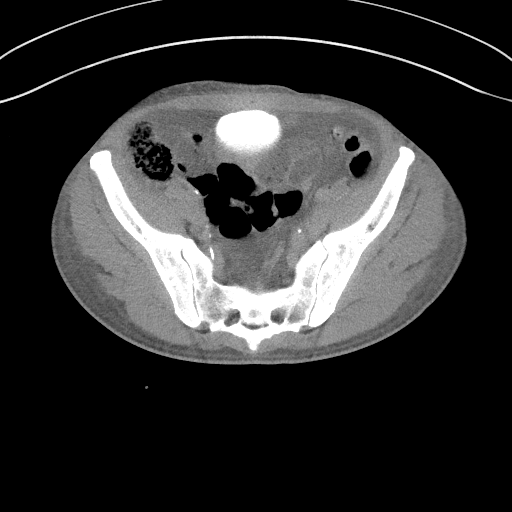
[im 40/99  soft-tissue]
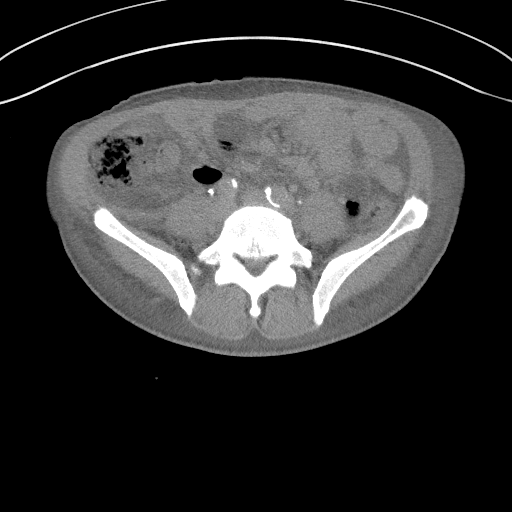
[im 50/99  soft-tissue]
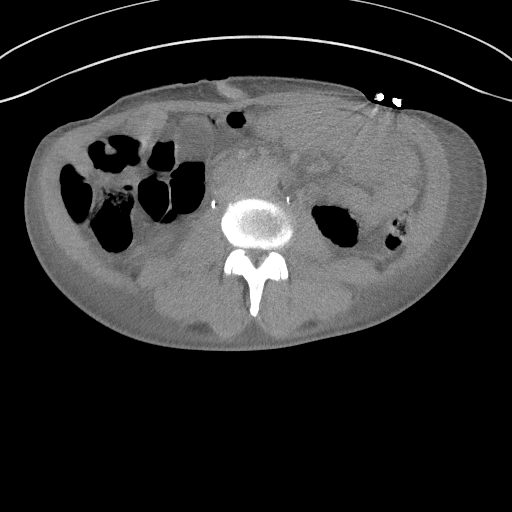
[im 59/99  soft-tissue]
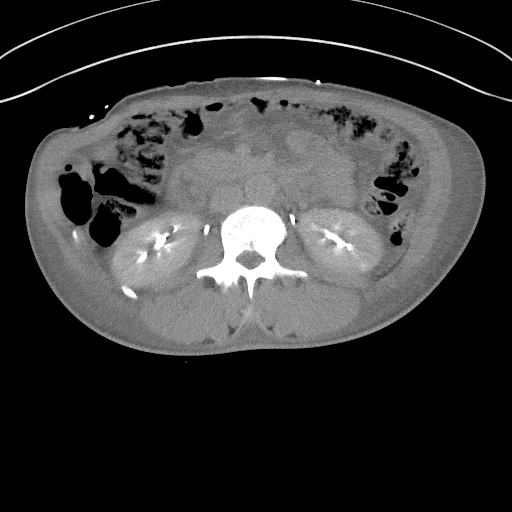
[im 59/99  lung]
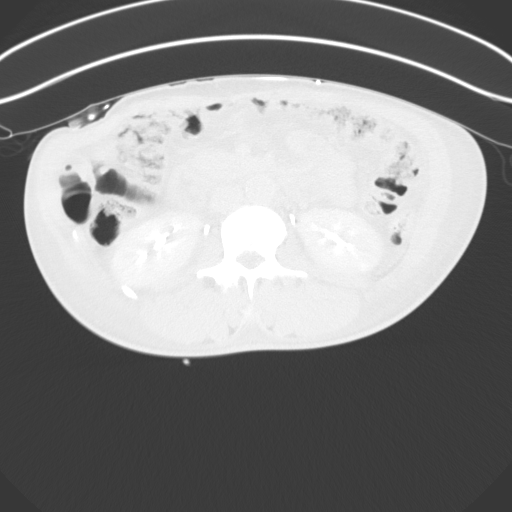
[im 69/99  soft-tissue]
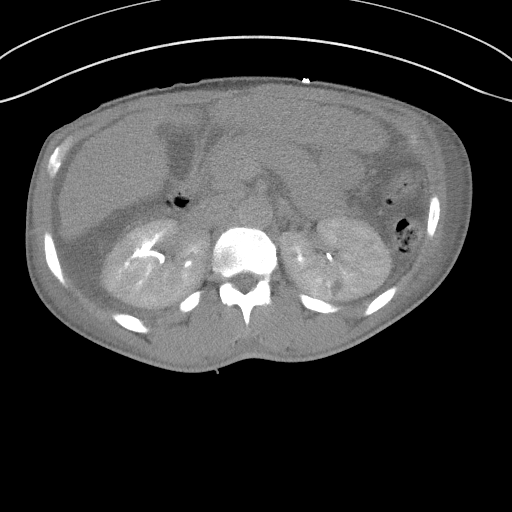
[im 69/99  lung]
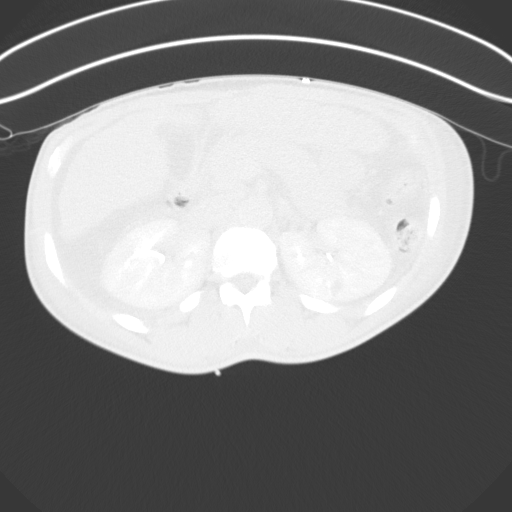
[im 79/99  soft-tissue]
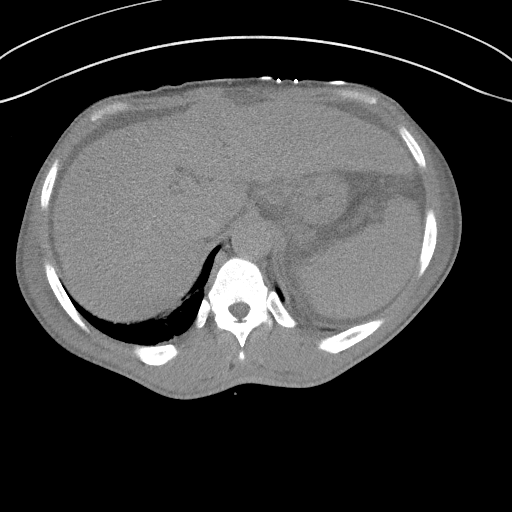
[im 79/99  lung]
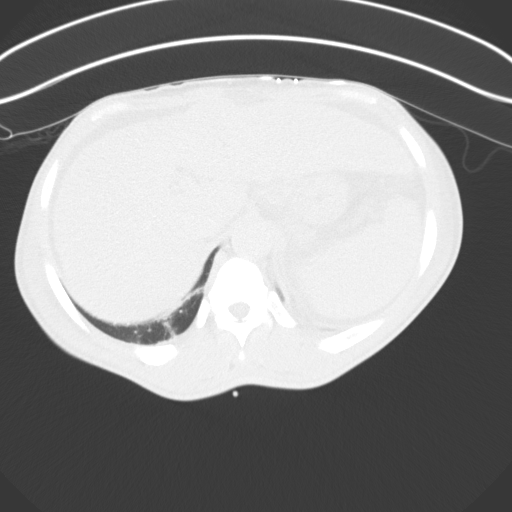
[im 89/99  soft-tissue]
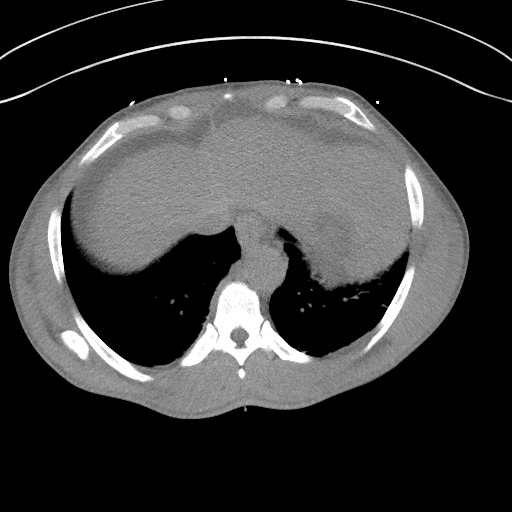
[im 89/99  lung]
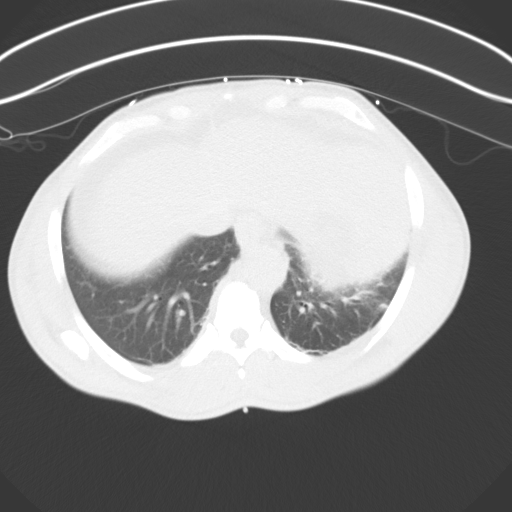
[im 89/99  bone]
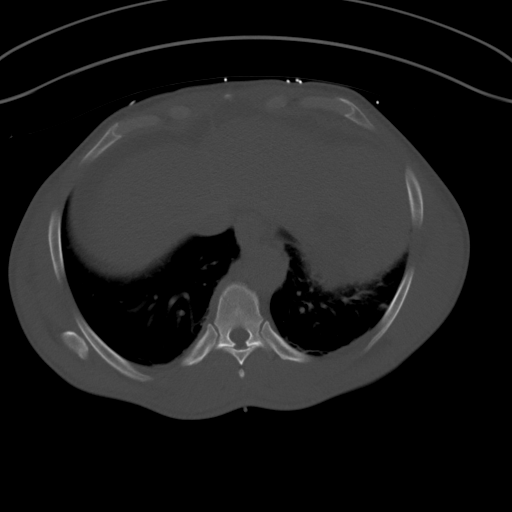

[Series 11: coronal pre · coronal · non-contrast · 0.68mm/px · 2 of 88 slices shown]
[im 30/88  soft-tissue]
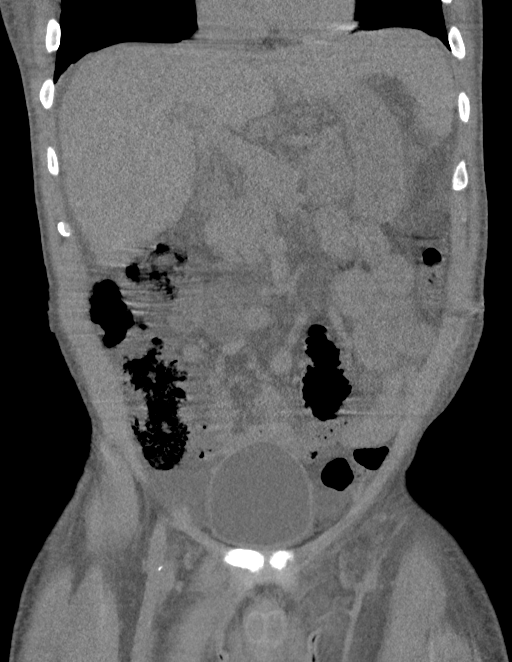
[im 59/88  soft-tissue]
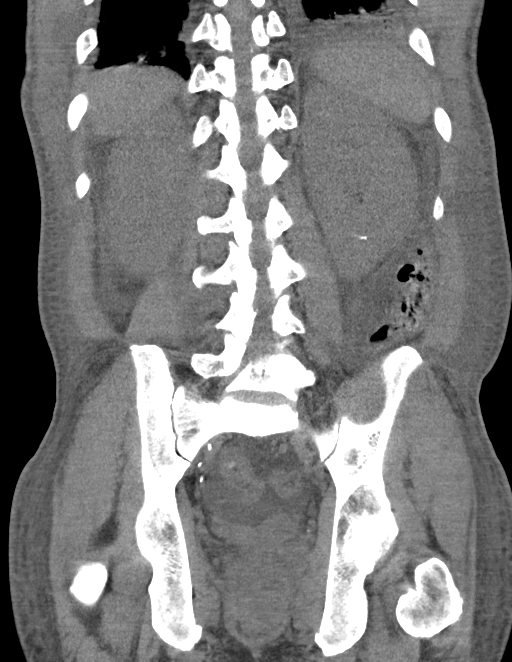

[11 of 46 positions shown; findings below may reference images not displayed]

FINDINGS: Lower chest: 8 mm nodule seen in the right middle lobe. Image
quality is degraded by respiratory motion. Small left pleural
effusion collapse/ consolidation in the left lower lobe. Heart size
normal. No pericardial effusion. Distal esophagus is grossly
unremarkable.

Hepatobiliary: Liver margin is markedly irregular. Stones are seen
in the gallbladder. No biliary ductal dilatation.

Pancreas: Negative.

Spleen: Negative.

Adrenals/Urinary Tract: Adrenal glands are unremarkable. Stones are
seen in the kidneys bilaterally. There is a striated appearance in
both kidneys with areas of ill-defined low attenuation on portal
venous phase imaging. 1.3 cm low-attenuation lesion is seen in the
interpolar left kidney (series 8, image 31), too small to
characterize. On delayed imaging there is low attenuation material
in the nondependent portion of the bladder.

Stomach/Bowel: Stomach, small bowel, appendix and colon are grossly
unremarkable.

Vascular/Lymphatic: Atherosclerotic calcification of the arterial
vasculature without abdominal aortic aneurysm. Borderline enlarged
porta hepatis lymph nodes, likely reactive.

Reproductive: Prostate is enlarged and contains a large area of
low-attenuation, measuring approximately 3.9 x 4.1 cm.

Other: Moderate ascites.  Mild presacral edema.

Musculoskeletal: Diffuse body wall edema. There are multiple fluid
collections within the chest wall musculature, some of which are
best seen on nephrographic phase imaging and some of which have
peripheral high attenuation on portal venous phase imaging. Index
collection in the left lateral chest wall measures 3.3 cm
(incompletely imaged, series 5, image 1). A 1.3 cm low-attenuation
collection with peripheral high attenuation is seen in the high
right thigh musculature (series 5, image 76). No worrisome lytic or
sclerotic lesions.
IMPRESSION: 1. Organized fluid collections within the prostate and soft tissue
musculature, highly worrisome for abscesses.
2. Striated appearance of both kidneys, most indicative of
pyelonephritis. Difficult to exclude developing abscesses.
3. Debris in the bladder.  Difficult to exclude a mass.
4. Right middle lobe nodule. Metastatic disease is not excluded.
Continued attention on follow-up is recommended.
5. Cirrhosis with moderate ascites.
6. Small left pleural effusion with collapse/consolidation in the
left lower lobe.
7. Bilateral renal stones.
8. Cholelithiasis.
9.  Aortic atherosclerosis ([id number]).

## 2018-12-21 ENCOUNTER — Inpatient Hospital Stay (HOSPITAL_COMMUNITY)
Admission: EM | Admit: 2018-12-21 | Discharge: 2018-12-24 | DRG: 432 | Disposition: A | Discharge status: Home / Self Care | Payer: Medicaid Other | Attending: Internal Medicine | Admitting: Internal Medicine

## 2018-12-21 ENCOUNTER — Inpatient Hospital Stay (HOSPITAL_COMMUNITY): Payer: Medicaid Other

## 2018-12-21 ENCOUNTER — Inpatient Hospital Stay (HOSPITAL_COMMUNITY): Payer: Medicaid Other | Admitting: Anesthesiology

## 2018-12-21 ENCOUNTER — Encounter (HOSPITAL_COMMUNITY): Payer: Self-pay | Admitting: Emergency Medicine

## 2018-12-21 ENCOUNTER — Encounter (HOSPITAL_COMMUNITY)
Admission: EM | Disposition: A | Discharge status: Home / Self Care | Payer: Self-pay | Source: Home / Self Care | Attending: Internal Medicine

## 2018-12-21 ENCOUNTER — Other Ambulatory Visit: Payer: Self-pay

## 2018-12-21 DIAGNOSIS — K7469 Other cirrhosis of liver: Principal | ICD-10-CM | POA: Diagnosis present

## 2018-12-21 DIAGNOSIS — Z79899 Other long term (current) drug therapy: Secondary | ICD-10-CM | POA: Diagnosis not present

## 2018-12-21 DIAGNOSIS — R011 Cardiac murmur, unspecified: Secondary | ICD-10-CM

## 2018-12-21 DIAGNOSIS — E785 Hyperlipidemia, unspecified: Secondary | ICD-10-CM | POA: Diagnosis present

## 2018-12-21 DIAGNOSIS — E119 Type 2 diabetes mellitus without complications: Secondary | ICD-10-CM

## 2018-12-21 DIAGNOSIS — Z7984 Long term (current) use of oral hypoglycemic drugs: Secondary | ICD-10-CM

## 2018-12-21 DIAGNOSIS — F17210 Nicotine dependence, cigarettes, uncomplicated: Secondary | ICD-10-CM | POA: Diagnosis present

## 2018-12-21 DIAGNOSIS — Z720 Tobacco use: Secondary | ICD-10-CM | POA: Diagnosis not present

## 2018-12-21 DIAGNOSIS — K209 Esophagitis, unspecified: Secondary | ICD-10-CM | POA: Diagnosis present

## 2018-12-21 DIAGNOSIS — R001 Bradycardia, unspecified: Secondary | ICD-10-CM | POA: Diagnosis not present

## 2018-12-21 DIAGNOSIS — Z7289 Other problems related to lifestyle: Secondary | ICD-10-CM | POA: Diagnosis not present

## 2018-12-21 DIAGNOSIS — I8501 Esophageal varices with bleeding: Secondary | ICD-10-CM

## 2018-12-21 DIAGNOSIS — Z23 Encounter for immunization: Secondary | ICD-10-CM

## 2018-12-21 DIAGNOSIS — K922 Gastrointestinal hemorrhage, unspecified: Secondary | ICD-10-CM

## 2018-12-21 DIAGNOSIS — R1013 Epigastric pain: Secondary | ICD-10-CM

## 2018-12-21 DIAGNOSIS — R188 Other ascites: Secondary | ICD-10-CM | POA: Diagnosis not present

## 2018-12-21 DIAGNOSIS — I8511 Secondary esophageal varices with bleeding: Secondary | ICD-10-CM | POA: Diagnosis present

## 2018-12-21 DIAGNOSIS — K920 Hematemesis: Secondary | ICD-10-CM | POA: Diagnosis not present

## 2018-12-21 DIAGNOSIS — I10 Essential (primary) hypertension: Secondary | ICD-10-CM | POA: Diagnosis present

## 2018-12-21 DIAGNOSIS — K2970 Gastritis, unspecified, without bleeding: Secondary | ICD-10-CM | POA: Diagnosis present

## 2018-12-21 DIAGNOSIS — B182 Chronic viral hepatitis C: Secondary | ICD-10-CM | POA: Diagnosis present

## 2018-12-21 DIAGNOSIS — K7460 Unspecified cirrhosis of liver: Secondary | ICD-10-CM | POA: Diagnosis not present

## 2018-12-21 DIAGNOSIS — K766 Portal hypertension: Secondary | ICD-10-CM | POA: Diagnosis present

## 2018-12-21 DIAGNOSIS — D696 Thrombocytopenia, unspecified: Secondary | ICD-10-CM | POA: Diagnosis present

## 2018-12-21 HISTORY — DX: Unspecified viral hepatitis C without hepatic coma: B19.20

## 2018-12-21 HISTORY — DX: Unspecified cirrhosis of liver: K74.60

## 2018-12-21 HISTORY — PX: ESOPHAGOGASTRODUODENOSCOPY (EGD) WITH PROPOFOL: SHX5813

## 2018-12-21 HISTORY — PX: ESOPHAGEAL BANDING: SHX5518

## 2018-12-21 HISTORY — DX: Essential (primary) hypertension: I10

## 2018-12-21 LAB — COMPREHENSIVE METABOLIC PANEL
ALT: 66 U/L — AB (ref 0–44)
AST: 109 U/L — ABNORMAL HIGH (ref 15–41)
Albumin: 2.6 g/dL — ABNORMAL LOW (ref 3.5–5.0)
Alkaline Phosphatase: 150 U/L — ABNORMAL HIGH (ref 38–126)
Anion gap: 11 (ref 5–15)
BUN: 7 mg/dL (ref 6–20)
CO2: 25 mmol/L (ref 22–32)
CREATININE: 0.91 mg/dL (ref 0.61–1.24)
Calcium: 8.4 mg/dL — ABNORMAL LOW (ref 8.9–10.3)
Chloride: 99 mmol/L (ref 98–111)
GFR calc Af Amer: >60 mL/min (ref >60)
GFR calc non Af Amer: >60 mL/min (ref >60)
GLUCOSE: 182 mg/dL — AB (ref 70–99)
Potassium: 3.3 mmol/L — ABNORMAL LOW (ref 3.5–5.1)
Sodium: 135 mmol/L (ref 135–145)
Total Bilirubin: 1.5 mg/dL — ABNORMAL HIGH (ref 0.3–1.2)
Total Protein: 8.1 g/dL (ref 6.5–8.1)

## 2018-12-21 LAB — ABO/RH: ABO/RH(D): O POS

## 2018-12-21 LAB — GLUCOSE, CAPILLARY
Glucose-Capillary: 78 mg/dL (ref 70–99)
Glucose-Capillary: 76 mg/dL (ref 70–99)
Glucose-Capillary: 116 mg/dL — ABNORMAL HIGH (ref 70–99)
Glucose-Capillary: 101 mg/dL — ABNORMAL HIGH (ref 70–99)

## 2018-12-21 LAB — URINALYSIS, ROUTINE W REFLEX MICROSCOPIC
Bilirubin Urine: NEGATIVE
GLUCOSE, UA: NEGATIVE mg/dL
HGB URINE DIPSTICK: NEGATIVE
Ketones, ur: NEGATIVE mg/dL
Leukocytes,Ua: NEGATIVE
Nitrite: NEGATIVE
Protein, ur: NEGATIVE mg/dL
Specific Gravity, Urine: 1.01 (ref 1.005–1.030)
pH: 6 (ref 5.0–8.0)

## 2018-12-21 LAB — LIPASE, BLOOD: Lipase: 49 U/L (ref 11–51)

## 2018-12-21 LAB — POC OCCULT BLOOD, ED: Fecal Occult Bld: NEGATIVE

## 2018-12-21 LAB — CBC
HCT: 37.5 % — ABNORMAL LOW (ref 39.0–52.0)
HCT: 37.1 % — ABNORMAL LOW (ref 39.0–52.0)
Hemoglobin: 12.5 g/dL — ABNORMAL LOW (ref 13.0–17.0)
Hemoglobin: 12.8 g/dL — ABNORMAL LOW (ref 13.0–17.0)
MCH: 29.9 pg (ref 26.0–34.0)
MCH: 30.3 pg (ref 26.0–34.0)
MCHC: 33.3 g/dL (ref 30.0–36.0)
MCHC: 34.5 g/dL (ref 30.0–36.0)
MCV: 89.7 fL (ref 80.0–100.0)
MCV: 87.9 fL (ref 80.0–100.0)
Platelets: 95 10*3/uL — ABNORMAL LOW (ref 150–400)
Platelets: 93 10*3/uL — ABNORMAL LOW (ref 150–400)
RBC: 4.18 MIL/uL — ABNORMAL LOW (ref 4.22–5.81)
RBC: 4.22 MIL/uL (ref 4.22–5.81)
RDW: 16.1 % — ABNORMAL HIGH (ref 11.5–15.5)
RDW: 16.1 % — ABNORMAL HIGH (ref 11.5–15.5)
WBC: 3.7 10*3/uL — ABNORMAL LOW (ref 4.0–10.5)
WBC: 4.2 10*3/uL (ref 4.0–10.5)
nRBC: 0 % (ref 0.0–0.2)
nRBC: 0 % (ref 0.0–0.2)

## 2018-12-21 LAB — FERRITIN: Ferritin: 920 ng/mL — ABNORMAL HIGH (ref 24–336)

## 2018-12-21 LAB — TYPE AND SCREEN
ABO/RH(D): O POS
Antibody Screen: NEGATIVE

## 2018-12-21 LAB — PROTIME-INR
INR: 1.32
Prothrombin Time: 16.2 seconds — ABNORMAL HIGH (ref 11.4–15.2)

## 2018-12-21 SURGERY — ESOPHAGOGASTRODUODENOSCOPY (EGD) WITH PROPOFOL
Anesthesia: Monitor Anesthesia Care

## 2018-12-21 MED ORDER — PANTOPRAZOLE SODIUM 40 MG IV SOLR
40.0000 mg | Freq: Once | INTRAVENOUS | Status: AC
  Administered 2018-12-21: 40 mg via INTRAVENOUS
  Filled 2018-12-21: qty 40

## 2018-12-21 MED ORDER — GADOBUTROL 1 MMOL/ML IV SOLN
6.0000 mL | Freq: Once | INTRAVENOUS | Status: AC | PRN
  Administered 2018-12-21: 6 mL via INTRAVENOUS

## 2018-12-21 MED ORDER — SODIUM CHLORIDE 0.9 % IV SOLN: 50.0000 ug/h | INTRAVENOUS | Status: DC

## 2018-12-21 MED ORDER — MORPHINE SULFATE (PF) 2 MG/ML IV SOLN
2.0000 mg | Freq: Once | INTRAVENOUS | Status: AC
  Administered 2018-12-21: 2 mg via INTRAVENOUS

## 2018-12-21 MED ORDER — OCTREOTIDE LOAD VIA INFUSION: 50.0000 ug | Freq: Once | INTRAVENOUS | Status: DC

## 2018-12-21 MED ORDER — INSULIN ASPART 100 UNIT/ML ~~LOC~~ SOLN
0.0000 [IU] | Freq: Three times a day (TID) | SUBCUTANEOUS | Status: DC
  Administered 2018-12-22: 5 [IU] via SUBCUTANEOUS
  Administered 2018-12-23 – 2018-12-24 (×3): 2 [IU] via SUBCUTANEOUS

## 2018-12-21 MED ORDER — SODIUM CHLORIDE 0.9 % IV SOLN
1.0000 g | INTRAVENOUS | Status: DC
  Administered 2018-12-21 – 2018-12-24 (×4): 1 g via INTRAVENOUS
  Filled 2018-12-21 (×4): qty 10

## 2018-12-21 MED ORDER — MORPHINE SULFATE (PF) 2 MG/ML IV SOLN
2.0000 mg | INTRAVENOUS | Status: DC | PRN
  Administered 2018-12-21 – 2018-12-22 (×6): 2 mg via INTRAVENOUS
  Filled 2018-12-21 (×6): qty 1

## 2018-12-21 MED ORDER — PANTOPRAZOLE SODIUM 40 MG IV SOLR
40.0000 mg | Freq: Two times a day (BID) | INTRAVENOUS | Status: DC
  Administered 2018-12-21 – 2018-12-22 (×3): 40 mg via INTRAVENOUS
  Filled 2018-12-21 (×3): qty 40

## 2018-12-21 MED ORDER — LACTATED RINGERS IV SOLN: INTRAVENOUS | Status: DC

## 2018-12-21 MED ORDER — PROPOFOL 10 MG/ML IV BOLUS
INTRAVENOUS | Status: DC | PRN
  Administered 2018-12-21: 50 mg via INTRAVENOUS
  Administered 2018-12-21: 25 mg via INTRAVENOUS
  Administered 2018-12-21 (×4): 50 mg via INTRAVENOUS
  Administered 2018-12-21: 25 mg via INTRAVENOUS
  Administered 2018-12-21: 50 mg via INTRAVENOUS

## 2018-12-21 MED ORDER — LIDOCAINE 2% (20 MG/ML) 5 ML SYRINGE
INTRAMUSCULAR | Status: DC | PRN
  Administered 2018-12-21: 80 mg via INTRAVENOUS

## 2018-12-21 MED ORDER — SODIUM CHLORIDE 0.9 % IV SOLN
INTRAVENOUS | Status: DC
  Administered 2018-12-21: 16:00:00 via INTRAVENOUS

## 2018-12-21 MED ORDER — POLYETHYLENE GLYCOL 3350 17 G PO PACK: 17.0000 g | PACK | Freq: Every day | ORAL | Status: DC | PRN

## 2018-12-21 MED ORDER — SODIUM CHLORIDE 0.9 % IV SOLN: INTRAVENOUS | Status: DC

## 2018-12-21 MED ORDER — OCTREOTIDE LOAD VIA INFUSION
50.0000 ug | Freq: Once | INTRAVENOUS | Status: DC
  Filled 2018-12-21 (×2): qty 25

## 2018-12-21 MED ORDER — MORPHINE SULFATE (PF) 4 MG/ML IV SOLN
4.0000 mg | Freq: Once | INTRAVENOUS | Status: AC
  Administered 2018-12-21: 4 mg via INTRAVENOUS
  Filled 2018-12-21: qty 1

## 2018-12-21 MED ORDER — SODIUM CHLORIDE 0.9% FLUSH
3.0000 mL | Freq: Once | INTRAVENOUS | Status: AC
  Administered 2018-12-21: 3 mL via INTRAVENOUS

## 2018-12-21 MED ORDER — SODIUM CHLORIDE 0.9 % IV SOLN
50.0000 ug/h | INTRAVENOUS | Status: AC
  Administered 2018-12-21 – 2018-12-24 (×4): 50 ug/h via INTRAVENOUS
  Filled 2018-12-21 (×9): qty 1

## 2018-12-21 MED ORDER — LACTATED RINGERS IV SOLN
INTRAVENOUS | Status: DC | PRN
  Administered 2018-12-21: 13:00:00 via INTRAVENOUS

## 2018-12-21 SURGICAL SUPPLY — 14 items

## 2018-12-21 NOTE — ED Provider Notes (Addendum)
[facility] Provider Note   CSN: [id number] Arrival date & time: 12/21/18  0444     History   Chief Complaint Chief Complaint  Patient presents with  . Abdominal Pain    HPI Cameron Coffey is a 58 y.o. male.  Who presents by EMS with complaint of epigastric abdominal pain that started yesterday.  York SpanielSaid it was on and off yesterday but got worse overnight and rates it as 10 out of 10.  He said he had some dry heaves and then vomited about a cup of blood.  He has not noticed any melena but he said it smelled sick.  No chest pain no fevers or chills no dizziness lightheadedness syncope urinary symptoms.  He has a history of hepatitis and states he drinks beer but has not had any about 10 days trying to cut down.  Denies frequent NSAID use.  No prior history of GI bleed.  The history is provided by the patient.  Abdominal Pain  Pain location:  Epigastric Pain quality: stabbing   Pain radiates to:  Does not radiate Pain severity:  Severe Onset quality:  Gradual Duration:  1 day Timing:  Intermittent Progression:  Improving Context: alcohol use and previous surgery   Context: not recent travel, not sick contacts, not suspicious food intake and not trauma   Relieved by:  None tried Worsened by:  Nothing Ineffective treatments:  None tried Associated symptoms: hematemesis, nausea and vomiting   Associated symptoms: no chest pain, no chills, no constipation, no cough, no diarrhea, no dysuria, no fever, no hematochezia, no hematuria, no melena, no shortness of breath and no sore throat   Risk factors: alcohol abuse   Risk factors: no NSAID use     Past Medical History:  Diagnosis Date  . Heart murmur    "born w/one"  . Type II diabetes mellitus [facility](HCC)     Patient Active Problem List   Diagnosis Date Noted  . Depression with anxiety 09/15/2017  . Dyslipidemia associated with type 2 diabetes mellitus (HCC) 07/30/2017  . Type II diabetes with  long term use of insulin (HCC) 07/30/2017  . Essential hypertension 05/28/2015    Past Surgical History:  Procedure Laterality Date  . TEE WITHOUT CARDIOVERSION N/A 08/10/2017   Procedure: TRANSESOPHAGEAL ECHOCARDIOGRAM (TEE);  Surgeon: Lewayne Buntingrenshaw, Brian S, MD;  [facility];  Service: Cardiovascular;  Laterality: N/A;  . TONSILLECTOMY  1982  . TRANSURETHRAL RESECTION OF PROSTATE N/A 07/26/2017   Procedure: TRANSURETHRAL RESECTION OF THE PROSTATE (TURP) WITH UPROOFING OF PROSTATE ABSCESS;  Surgeon: Bjorn PippinWrenn, John, MD;  Location: [facility] ORS;  Service: Urology;  Laterality: N/A;        Home Medications    Prior to Admission medications   Medication Sig Start Date End Date Taking? Authorizing Provider  amoxicillin (AMOXIL) 500 MG capsule Take 1 capsule (500 mg total) by mouth 3 (three) times daily. 11/02/18   Fayrene Helperran, Bowie, PA-C  atorvastatin (LIPITOR) 20 MG tablet Take 20 mg by mouth daily. 07/30/17   [provider]  escitalopram (LEXAPRO) 10 MG tablet Take 10 mg by mouth daily. 08/18/17   [provider]  furosemide (LASIX) 20 MG tablet Take 1 tablet (20 mg total) by mouth daily. Patient not taking: Reported on 01/25/2018 08/14/17   Randel PiggSilva Zapata, Dorma RussellEdwin, MD  glipiZIDE (GLUCOTROL) 10 MG tablet Take 1 tablet (10 mg total) by mouth 2 (two) times daily before a meal. 02/01/18   Lorre MunroeBaity, Regina W, NP  guaiFENesin-codeine  100-10 MG/5ML syrup Take 5 mLs by mouth every 4 (four) hours as needed. 12/15/18   Tommi Rumps, PA-C  lisinopril (PRINIVIL,ZESTRIL) 10 MG tablet Take 10 mg by mouth daily.    [provider]  metFORMIN (GLUCOPHAGE) 1000 MG tablet Take 1 tablet (1,000 mg total) by mouth 2 (two) times daily. COMPLETE PHYSICAL EXAM WITH LABS REQUIRED FOR ADDITIONAL REFILLS Patient taking differently: Take 1,000 mg by mouth 2 (two) times daily.  02/01/18   Lorre Munroe, NP  metoprolol tartrate (LOPRESSOR) 25 MG tablet Take 0.5 tablets (12.5 mg total) by mouth 2 (two)  times daily. 07/29/17   Katha Hamming, MD    Family History Family History  Problem Relation Age of Onset  . Hyperlipidemia Mother   . Diabetes Mother   . Hyperlipidemia Father   . Diabetes Father     Social History Social History   Tobacco Use  . Smoking status: Current Every Day Smoker    Packs/day: 0.50    Years: 32.00    Pack years: 16.00    Types: Cigarettes  . Smokeless tobacco: Never Used  Substance Use Topics  . Alcohol use: Yes    Alcohol/week: 6.0 standard drinks    Types: 6 Cans of beer per week    Comment: "drink qd"  . Drug use: Yes    Types: Marijuana    Comment: pt reports last use several months ago.      Allergies   Patient has no known allergies.   Review of Systems Review of Systems  Constitutional: Negative for chills and fever.  HENT: Negative for sore throat.   Eyes: Negative for visual disturbance.  Respiratory: Negative for cough and shortness of breath.   Cardiovascular: Negative for chest pain.  Gastrointestinal: Positive for abdominal pain, hematemesis, nausea and vomiting. Negative for constipation, diarrhea, hematochezia and melena.  Genitourinary: Negative for dysuria and hematuria.  Musculoskeletal: Negative for neck pain.  Skin: Negative for rash.  Neurological: Negative for headaches.     Physical Exam Updated Vital Signs BP (!) 164/90 (BP Location: Right Arm)   Pulse 60   Temp 98.5 F (36.9 C) (Oral)   Resp 18   SpO2 100%   Physical Exam Vitals signs and nursing note reviewed.  Constitutional:      Appearance: He is well-developed.  HENT:     Head: Normocephalic and atraumatic.  Eyes:     Conjunctiva/sclera: Conjunctivae normal.  Neck:     Musculoskeletal: Neck supple.  Cardiovascular:     Rate and Rhythm: Normal rate and regular rhythm.     Heart sounds: No murmur.  Pulmonary:     Effort: Pulmonary effort is normal. No respiratory distress.     Breath sounds: Normal breath sounds.  Abdominal:      Palpations: Abdomen is soft.     Tenderness: There is abdominal tenderness in the epigastric area. There is no guarding or rebound.  Musculoskeletal:        General: No signs of injury.     Right lower leg: No edema.     Left lower leg: No edema.  Skin:    General: Skin is warm and dry.     Capillary Refill: Capillary refill takes less than 2 seconds.  Neurological:     General: No focal deficit present.     Mental Status: He is alert and oriented to person, place, and time.      ED Treatments / Results  Labs (all labs ordered are listed,  but only abnormal results are displayed) Labs Reviewed  COMPREHENSIVE METABOLIC PANEL - Abnormal; Notable for the following components:      Result Value   Potassium 3.3 (*)    Glucose, Bld 182 (*)    Calcium 8.4 (*)    Albumin 2.6 (*)    AST 109 (*)    ALT 66 (*)    Alkaline Phosphatase 150 (*)    Total Bilirubin 1.5 (*)    All other components within normal limits  CBC - Abnormal; Notable for the following components:   WBC 3.7 (*)    RBC 4.18 (*)    Hemoglobin 12.5 (*)    HCT 37.5 (*)    RDW 16.1 (*)    Platelets 95 (*)    All other components within normal limits  LIPASE, BLOOD  URINALYSIS, ROUTINE W REFLEX MICROSCOPIC  PROTIME-INR  POC OCCULT BLOOD, ED  TYPE AND SCREEN    EKG None  Radiology No results found.  Procedures .Critical Care Performed by: Terrilee Files, MD Authorized by: Terrilee Files, MD   Critical care provider statement:    Critical care time (minutes):  30   Critical care was necessary to treat or prevent imminent or life-threatening deterioration of the following conditions:  Circulatory failure   Critical care was time spent personally by me on the following activities:  Discussions with consultants, evaluation of patient's response to treatment, examination of patient, ordering and performing treatments and interventions, ordering and review of laboratory studies, ordering and review of  radiographic studies, pulse oximetry, re-evaluation of patient's condition, obtaining history from patient or surrogate, review of old charts and development of treatment plan with patient or surrogate   I assumed direction of critical care for this patient from another provider in my specialty: no   Comments:     Gi bleed requiring endoscopy   (including critical care time)  Medications Ordered in ED Medications  pantoprazole (PROTONIX) injection 40 mg (has no administration in time range)  morphine 4 MG/ML injection 4 mg (has no administration in time range)  sodium chloride flush (NS) 0.9 % injection 3 mL (3 mLs Intravenous Given 12/21/18 0723)     Initial Impression / Assessment and Plan / ED Course  I have reviewed the triage vital signs and the nursing notes.  Pertinent labs & imaging results that were available during my care of the patient were reviewed by me and considered in my medical decision making (see chart for details).  Clinical Course as of Dec 22 1747  Wed Dec 21, 2018  6546 Rectal exam done with chaperone nurse present.  Normal sphincter tone no masses, guaiac sent to lab for evaluation.   [MB]  [phone] Discussed with  GI who recommends the patient be admitted and anticipated for an upper GI.   [MB]  0908 I updated the patient on the plan and he is agreeable to admission.   [MB]  [phone] Discussed with the internal medicine service for unassigned admission who accepts the patient for admission.   [MB]    Clinical Course User Index [MB] Terrilee Files, MD     Final Clinical Impressions(s) / ED Diagnoses   Final diagnoses:  Upper GI bleed    ED Discharge Orders    None       Terrilee Files, MD 12/21/18 1751    Terrilee Files, MD 12/21/18 1753

## 2018-12-21 NOTE — Anesthesia Postprocedure Evaluation (Signed)
Anesthesia Post Note  Patient: Cameron Coffey  Procedure(s) Performed: ESOPHAGOGASTRODUODENOSCOPY (EGD) WITH PROPOFOL (N/A ) ESOPHAGEAL BANDING     Patient location during evaluation: PACU Anesthesia Type: MAC Level of consciousness: awake and alert Pain management: pain level controlled Vital Signs Assessment: post-procedure vital signs reviewed and stable Respiratory status: spontaneous breathing Cardiovascular status: stable Anesthetic complications: no    Last Vitals:  Vitals:   12/21/18 1325 12/21/18 1446  BP: (!) 189/85 (!) 173/87  Pulse: (!) 59 (!) 52  Resp: 14 17  Temp:  36.9 C  SpO2: 98% 100%    Last Pain:  Vitals:   12/21/18 1446  TempSrc: Oral  PainSc:                  Nolon Nations

## 2018-12-21 NOTE — Progress Notes (Signed)
Pt just came back from MRI. Pt very upset not having a tray. Paged MD on call for update. MD stated pt remain NPO.   Pt very upset, but took a cup of coffee from the coffee machine nearby. Educated pt on his diet. Pt needs further education.

## 2018-12-21 NOTE — Progress Notes (Signed)
Pt off unit to MRI,  [id number]

## 2018-12-21 NOTE — Anesthesia Preprocedure Evaluation (Addendum)
Anesthesia Evaluation  Patient identified by MRN, date of birth, ID band Patient awake    Reviewed: Allergy & Precautions, NPO status , Patient's Chart, lab work & pertinent test results, reviewed documented beta blocker date and time   History of Anesthesia Complications (+) MALIGNANT HYPERTHERMIA  Airway Mallampati: II  TM Distance: >3 FB     Dental  (+) Chipped, Upper Dentures, Lower Dentures   Pulmonary Current Smoker, former smoker,    breath sounds clear to auscultation       Cardiovascular hypertension, Pt. on medications + Valvular Problems/Murmurs  Rhythm:Regular Rate:Normal     Neuro/Psych    GI/Hepatic   Endo/Other  diabetes, Type 2  Renal/GU      Musculoskeletal   Abdominal   Peds  Hematology   Anesthesia Other Findings Echo shows EF 65. BP 97/60. Truck driver - retired.  Reproductive/Obstetrics                            Anesthesia Physical  Anesthesia Plan  ASA: III  Anesthesia Plan: MAC   Post-op Pain Management:    Induction: Intravenous  PONV Risk Score and Plan: 1 and Treatment may vary due to age or medical condition, Propofol infusion and Ondansetron  Airway Management Planned: Natural Airway  Additional Equipment:   Intra-op Plan:   Post-operative Plan:   Informed Consent: I have reviewed the patients History and Physical, chart, labs and discussed the procedure including the risks, benefits and alternatives for the proposed anesthesia with the patient or authorized representative who has indicated his/her understanding and acceptance.     Dental advisory given  Plan Discussed with: CRNA  Anesthesia Plan Comments:         Anesthesia Quick Evaluation

## 2018-12-21 NOTE — Transfer of Care (Signed)
Immediate Anesthesia Transfer of Care Note  Patient: Cameron Coffey  Procedure(s) Performed: ESOPHAGOGASTRODUODENOSCOPY (EGD) WITH PROPOFOL (N/A ) ESOPHAGEAL BANDING  Patient Location: Endoscopy Unit  Anesthesia Type:MAC  Level of Consciousness: drowsy  Airway & Oxygen Therapy: Patient Spontanous Breathing and Patient connected to nasal cannula oxygen  Post-op Assessment: Report given to RN and Post -op Vital signs reviewed and stable  Post vital signs: Reviewed and stable  Last Vitals:  Vitals Value Taken Time  BP 194/93 12/21/2018  1:12 PM  Temp    Pulse 64 12/21/2018  1:13 PM  Resp 16 12/21/2018  1:13 PM  SpO2 100 % 12/21/2018  1:13 PM  Vitals shown include unvalidated device data.  Last Pain:  Vitals:   12/21/18 1123  TempSrc: Oral  PainSc: 8          Complications: No apparent anesthesia complications

## 2018-12-21 NOTE — ED Notes (Signed)
Admitting Dr bedside 

## 2018-12-21 NOTE — ED Notes (Signed)
Attempted to give report to receiving RN, states she will return RN's phone call in . Endo called and stated they were enroute to pick up Pt for procedure and will then take Pt to floor bed

## 2018-12-21 NOTE — Consult Note (Addendum)
Consultation  Referring Provider: Dr. Melina Copa    Primary Care Physician:  Gae Bon, NP Primary Gastroenterologist:Unassigned         Reason for Consultation: Hematemesis and abdominal pain         HPI:   Cameron Coffey is a 58 y.o. male with a past medical history as listed below who presented to the ER today via EMS with a complaint of epigastric abdominal pain that started yesterday and one episode of hematemesis.    Today, the patient explains that the epigastric pain started yesterday around 11:00 in the morning and was "on and off" all day, but got worse overnight, he describes his pain as stabbing and rates it as a 10/10, it was associated with some dry heaves.  He did have one episode of vomiting about a "cup of blood", he tells me this was bright red and it was around 11 PM last night.  Apparently he called the ER and was told to come in.  Denies seeing any melena but describes a foul-smelling stool.  No diarrhea.  Did eat out at [place] yesterday morning.    Per patient history of hepatitis and does describe drinking beer but has not had any in about 10 days as he is "trying to cut down".    Denies fever, chills, weight loss, anorexia, heartburn, reflux, melena or use of NSAIDs.  ED course: Potassium 3.3, AST 109, ALT 66, alk phos 150, total bili 1.5, hemoglobin 12.5, platelets 95, lipase normal, occult blood negative  No previous GI history.  Past Medical History:  Diagnosis Date  . Heart murmur    "born w/one"  . Type II diabetes mellitus ([place])     Past Surgical History:  Procedure Laterality Date  . TEE WITHOUT CARDIOVERSION N/A 08/10/2017   Procedure: TRANSESOPHAGEAL ECHOCARDIOGRAM (TEE);  Surgeon: Lelon Perla, MD;  Location: [facility];  Service: Cardiovascular;  Laterality: N/A;  . TONSILLECTOMY  1982  . TRANSURETHRAL RESECTION OF PROSTATE N/A 07/26/2017   Procedure: TRANSURETHRAL RESECTION OF THE PROSTATE (TURP) WITH UPROOFING OF PROSTATE  ABSCESS;  Surgeon: Irine Seal, MD;  Location: [facility] ORS;  Service: Urology;  Laterality: N/A;    Family History  Problem Relation Age of Onset  . Hyperlipidemia Mother   . Diabetes Mother   . Hyperlipidemia Father   . Diabetes Father     Social History   Tobacco Use  . Smoking status: Current Every Day Smoker    Packs/day: 0.50    Years: 32.00    Pack years: 16.00    Types: Cigarettes  . Smokeless tobacco: Never Used  Substance Use Topics  . Alcohol use: Yes    Alcohol/week: 6.0 standard drinks    Types: 6 Cans of beer per week    Comment: "drink qd"  . Drug use: Yes    Types: Marijuana    Comment: pt reports last use several months ago.     Prior to Admission medications   Medication Sig Start Date End Date Taking? Authorizing Provider  amoxicillin (AMOXIL) 500 MG capsule Take 1 capsule (500 mg total) by mouth 3 (three) times daily. 11/02/18   Domenic Moras, PA-C  atorvastatin (LIPITOR) 20 MG tablet Take 20 mg by mouth daily. 07/30/17   [provider]  escitalopram (LEXAPRO) 10 MG tablet Take 10 mg by mouth daily. 08/18/17   [provider]  furosemide (LASIX) 20 MG tablet Take 1 tablet (20 mg total) by mouth daily. Patient not  taking: Reported on 01/25/2018 08/14/17   Silva Zapata, Edwin, MD  glipiZIDE (GLUCOTROL) 10 MG tablet Take 1 tablet (10 mg total) by mouth 2 (two) times daily before a meal. 02/01/18   Baity, Regina W, NP  guaiFENesin-codeine 100-10 MG/5ML syrup Take 5 mLs by mouth every 4 (four) hours as needed. 12/15/18   Summers, Rhonda L, PA-C  lisinopril (PRINIVIL,ZESTRIL) 10 MG tablet Take 10 mg by mouth daily.    [provider]  metFORMIN (GLUCOPHAGE) 1000 MG tablet Take 1 tablet (1,000 mg total) by mouth 2 (two) times daily. COMPLETE PHYSICAL EXAM WITH LABS REQUIRED FOR ADDITIONAL REFILLS Patient taking differently: Take 1,000 mg by mouth 2 (two) times daily.  02/01/18   Baity, Regina W, NP  metoprolol tartrate (LOPRESSOR) 25 MG tablet  Take 0.5 tablets (12.5 mg total) by mouth 2 (two) times daily. 07/29/17   Konidena, Snehalatha, MD    No current facility-administered medications for this encounter.    Current Outpatient Medications  Medication Sig Dispense Refill  . amoxicillin (AMOXIL) 500 MG capsule Take 1 capsule (500 mg total) by mouth 3 (three) times daily. 21 capsule 0  . atorvastatin (LIPITOR) 20 MG tablet Take 20 mg by mouth daily.    . escitalopram (LEXAPRO) 10 MG tablet Take 10 mg by mouth daily.    . furosemide (LASIX) 20 MG tablet Take 1 tablet (20 mg total) by mouth daily. (Patient not taking: Reported on 01/25/2018) 30 tablet   . glipiZIDE (GLUCOTROL) 10 MG tablet Take 1 tablet (10 mg total) by mouth 2 (two) times daily before a meal. 60 tablet 3  . guaiFENesin-codeine 100-10 MG/5ML syrup Take 5 mLs by mouth every 4 (four) hours as needed. 120 mL 0  . lisinopril (PRINIVIL,ZESTRIL) 10 MG tablet Take 10 mg by mouth daily.    . metFORMIN (GLUCOPHAGE) 1000 MG tablet Take 1 tablet (1,000 mg total) by mouth 2 (two) times daily. COMPLETE PHYSICAL EXAM WITH LABS REQUIRED FOR ADDITIONAL REFILLS (Patient taking differently: Take 1,000 mg by mouth 2 (two) times daily. ) 60 tablet 2  . metoprolol tartrate (LOPRESSOR) 25 MG tablet Take 0.5 tablets (12.5 mg total) by mouth 2 (two) times daily. 60 tablet 0    Allergies as of 12/21/2018  . (No Known Allergies)     Review of Systems:    Constitutional: No weight loss, fever or chills Skin: No rash  Cardiovascular: No chest pain Respiratory: No SOB  Gastrointestinal: See HPI and otherwise negative Genitourinary: No dysuria  Neurological: No headache, dizziness or syncope Musculoskeletal: No new muscle or joint pain Hematologic: No bruising Psychiatric: No history of depression or anxiety    Physical Exam:  Vital signs in last 24 hours: Temp:  [98.5 F (36.9 C)] 98.5 F (36.9 C) (02/12 0458) Pulse Rate:  [60] 60 (02/12 0458) Resp:  [18] 18 (02/12 0458) BP:  (164)/(90) 164/90 (02/12 0458) SpO2:  [100 %] 100 % (02/12 0458)   General:   Pleasant AA male appears to be in NAD, Well developed, Well nourished, alert and cooperative Head:  Normocephalic and atraumatic. Eyes:   PEERL, EOMI. No icterus. Conjunctiva pink. Ears:  Normal auditory acuity. Neck:  Supple Throat: Oral cavity and pharynx without inflammation, swelling or lesion. Lungs: Respirations even and unlabored. Lungs clear to auscultation bilaterally.   No wheezes, crackles, or rhonchi.  Heart: Normal S1, S2. No MRG. Regular rate and rhythm. No peripheral edema, cyanosis or pallor.  Abdomen:  Soft, nondistended, moderate epigastric ttp. No rebound or   guarding. Normal bowel sounds. No appreciable masses or hepatomegaly. Rectal:  Not performed.  Msk:  Symmetrical without gross deformities. Peripheral pulses intact.  Extremities:  Without edema, no deformity or joint abnormality. Normal ROM, normal sensation. Neurologic:  Alert and  oriented x4;  grossly normal neurologically.  Skin:   Dry and intact without significant lesions or rashes. Psychiatric: Demonstrates good judgement and reason without abnormal affect or behaviors.   LAB RESULTS: Recent Labs    12/21/18 0453  WBC 3.7*  HGB 12.5*  HCT 37.5*  PLT 95*   BMET Recent Labs    12/21/18 0453  NA 135  K 3.3*  CL 99  CO2 25  GLUCOSE 182*  BUN 7  CREATININE 0.91  CALCIUM 8.4*   LFT Recent Labs    12/21/18 0453  PROT 8.1  ALBUMIN 2.6*  AST 109*  ALT 66*  ALKPHOS 150*  BILITOT 1.5*   PT/INR Recent Labs    12/21/18 0724  LABPROT 16.2*  INR 1.32    Impression / Plan:   Impression: 1.  Hematemesis: 1 episode of about a cup of bright red blood 12/20/2018 around 11 PM, associated with large amount of epigastric pain yesterday, denies NSAID use; consider PUD versus gastritis versus varices given alcohol history 2.  Epigastric pain: As above 3.  Cirrhosis: Seen on CT abdomen pelvis 08/12/2017, also has  thrombocytopenia and elevated LFTs, likely due to alcohol use, does not appear to have been worked up previously   Plan: 1.  Continue to monitor hemoglobin with transfusion as needed less than 7 2.  Agree with Pantoprazole 40 mg twice daily IV 3.  Patient remain n.p.o. for now 4.  We will schedule patient for an EGD this morning with Dr. Armbruster.  Did discuss risks, benefits, limitations and alternatives and patient agrees to proceed. 5.  May need to follow as outpatient for cirrhosis work up 6.  Please await any further recommendations from Dr. Armbruster after procedure.  Thank you for your kind consultation, we will continue to follow.  Jennifer Lynne Lemmon  12/21/2018, 9:18 AM   

## 2018-12-21 NOTE — ED Triage Notes (Signed)
Patient with abdominal pain, has been having it all day.  Patient has vomited x2 before EMS arrival.  Patient with VSS en route to ED.

## 2018-12-21 NOTE — Anesthesia Procedure Notes (Signed)
Procedure Name: MAC Date/Time: 12/21/2018 12:38 PM Performed by: Teressa Lower., CRNA Pre-anesthesia Checklist: Patient identified, Emergency Drugs available, Suction available and Patient being monitored Patient Re-evaluated:Patient Re-evaluated prior to induction

## 2018-12-21 NOTE — H&P (Signed)
Date: 12/21/2018               Patient Name:  Cameron Coffey MRN: [id number]  DOB: Dec 20, 1960 Age / Sex: 58 y.o., male   PCP: Gae Bon, NP         Medical Service: Internal Medicine Teaching Service         Attending Physician: Dr. Sid Falcon, MD    First Contact: Dr. Annie Paras Pager: [phone]  Second Contact: Dr. Shan Levans Pager: [phone]       After Hours (After 5p/  First Contact Pager: [phone]  weekends / holidays): Second Contact Pager: [phone]   Chief Complaint: hematemesis  History of Present Illness:  Cameron Coffey is a 57yo male with PMH of HTN, HLD, Hep C, EtOH use, presenting to [facility] with hematemesis and epigastric abdominal pain.  Patient states he started having diffuse abdominal pain around 11 AM yesterday shortly after eating breakfast of a sausage biscuit.  He was able to eat throughout the day but noticed that his pain was getting progressively worse.  After dinner of hamburger he started having nausea, had a few bouts of dry heaving, then proceeded to have hematemesis with an estimated amount of over 1 cup.  The emesis did not contain any food or bile as far as he could tell.  Over the last day and since his hematemesis, he has been passing gas and had normal bowel movements for him; his bowel movement was very light, without frank blood or melena.  He has not had further nausea since the episode of emesis last night.  Has had ongoing abdominal pain that did significantly improve with morphine which was administered in the emergency room.  For the past week he has had flulike symptoms with some chills and myalgias and overall feeling poorly.  He states he got tested for the flu and was negative about a week ago.  He denies headaches, congestion, cough, fevers.  Patient denies prior history of similar symptoms, history of ulcers, use of NSAIDs.  Patient has noticed about 130 pound weight loss in about a 6-month  He states he has a good appetite with good  p.o. intake but has continued to lose weight.  He denies fevers, chills.  Further patient denies chest pain, shortness of breath, lower extremity swelling, increased abdominal girth.  He continues to drink 2-3 beers every few days; last drink was about a week and a half ago.  He continues to use occasional cocaine, last used was about a week and a half ago.  He states he never sought treatment for his hepatitis C.  He does not think he has had follow-up imaging of his liver since his last hospitalization.  In the ED, he was found to be hemodynamically stable.  His hemoglobin was 12.5 (slightly lower diagnosed previous); he continues to have thrombocytopenia platelets of 95.  C-Met revealed a potassium of 3.3 otherwise normal kidney function.  Elevated AST 109, ALT 66, alk phos 150, total bili 1.5.  He was given a one-time IV Protonix dose.  GI was consulted for endoscopy.  Meds:  Current Meds  Medication Sig  . atorvastatin (LIPITOR) 20 MG tablet Take 20 mg by mouth daily.  .Marland Kitchenlisinopril (PRINIVIL,ZESTRIL) 10 MG tablet Take 10 mg by mouth daily.  . metFORMIN (GLUCOPHAGE) 1000 MG tablet Take 1 tablet (1,000 mg total) by mouth 2 (two) times daily. COMPLETE PHYSICAL EXAM WITH LABS REQUIRED FOR ADDITIONAL REFILLS (Patient taking differently: Take  1,000 mg by mouth 2 (two) times daily. )    Allergies: Allergies as of 12/21/2018  . (No Known Allergies)   Past Medical History:  Diagnosis Date  . Heart murmur    "born w/one"  . Type II diabetes mellitus ([place])     Family History: Mother and father had hypertension.  His sister has a history of cancer, but he is unsure what type  Social History: Patient endorses 2 to 3 cans of beers every couple of days, with last use about 10 days ago.  Denies prior withdrawals or history of seizures. He endorses occasional marijuana use and snorting cocaine, with last used about 10 days ago.  Review of Systems: A complete ROS was negative except as per HPI.    Physical Exam: Blood pressure (!) 182/94, pulse (!) 53, temperature 98.2 F (36.8 C), temperature source Oral, resp. rate 13, height 5' 9"  (1.753 m), weight 59 kg, SpO2 99 %. GENERAL- alert, co-operative, appears as stated age, not in any distress. HEENT- Atraumatic, normocephalic, no scleral icterus, oral mucosa appears moist CARDIAC- RRR, systolic murmur best heard at RUSB, no rubs or gallops. RESP- Moving equal volumes of air, and clear to auscultation bilaterally, no wheezes or crackles. ABDOMEN- Soft, nondistended, diffuse mild tenderness with focal tenderness in epigastic and RUQs. No organomegaly noted. NEURO- No obvious Cr N abnormality. EXTREMITIES- pulse 2+ DP/PT, symmetric, no pedal edema. SKIN- Warm, dry.Marland Kitchen PSYCH- Normal mood and affect, appropriate thought content and speech.  Assessment & Plan by Problem: Principal Problem:   Upper GI bleed Active Problems:   Cirrhosis of liver 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)   Chronic hepatitis C without hepatic coma (HCC)  Hematemesis: Patient with cirrhosis in setting of active HCV and ongoing EtOH use presenting with 1 episode of hematemesis last night. It sounds as though the hematemesis was preceded by dry heaving - etiology could be Mallory weiss tears, gastritis, PUD, vericeal bleed. He is hemodynamically stable at this time with only slight drop in Hgb to 12.5. GI has been consulted and will perform endoscopy today. --GI consult - appreciate recommendations --as he has risk of this being vericeal bleed, will go ahead and treat as such with ceftriaxone and octreotide; we can adjust therapy after endoscopy --IV PPI --f/u CBC at 5pm and in AM  Cirrhosis HCV Liver lesion: On CT abd from 2018, he was noted to have cirrhosis and a 0.6cm lesion of right liver lobe (stable at the time). He has untreated HCV and ongoing cocaine use. He is interested in treatment for HCV.  --at discharge, consider [facility] referral for HCV treatment; recommendation  for MRI abdomen w/wo contrast for evaluation of liver lesion especially in setting of ongoing weight loss.  HTN: On lisinopril 25m daily at home. --hold for now, can restart if HD stable and need BP control  T2DM: On metformin  Diet: NPO for now, f/u GI recs after endoscopy IVF: n/a for now VTE ppx: SCDs Code: FULL - discussed on admission  Dispo: Admit patient to Inpatient with expected length of stay greater than 2 midnights.  Signed: SAlphonzo Grieve MD 12/21/2018, 12:45 PM  [phone]

## 2018-12-21 NOTE — Interval H&P Note (Signed)
History and Physical Interval Note:  12/21/2018 12:40 PM  Cameron Coffey  has presented today for surgery, with the diagnosis of Hematemesis  The various methods of treatment have been discussed with the patient and family. After consideration of risks, benefits and other options for treatment, the patient has consented to  Procedure(s): ESOPHAGOGASTRODUODENOSCOPY (EGD) WITH PROPOFOL (N/A) as a surgical intervention .  The patient's history has been reviewed, patient examined, no change in status, stable for surgery.  I have reviewed the patient's chart and labs.  Questions were answered to the patient's satisfaction.     Viviann SpareSteven P Rashunda Passon

## 2018-12-21 NOTE — Op Note (Signed)
[facility] Patient Name: Cameron Coffey Procedure Date : 12/21/2018 MRN: [id number] Attending MD: Willaim Rayas. Adela Lank , MD Date of Birth: 1961-02-02 CSN: [id number] Age: 58 Admit Type: Inpatient Procedure:                Upper GI endoscopy Indications:              Hematemesis, history of cirrhosis , hepatitis C Providers:                Viviann Spare P. Adela Lank, MD, Norman Clay, RN, Arlee Muslim Tech., Technician Referring MD:              Medicines:                Monitored Anesthesia Care Complications:            No immediate complications. Estimated blood loss:                            Minimal. Estimated Blood Loss:     Estimated blood loss was minimal. Procedure:                Pre-Anesthesia Assessment:                           - Prior to the procedure, a History and Physical                            was performed, and patient medications and                            allergies were reviewed. The patient's tolerance of                            previous anesthesia was also reviewed. The risks                            and benefits of the procedure and the sedation                            options and risks were discussed with the patient.                            All questions were answered, and informed consent                            was obtained. Prior Anticoagulants: The patient has                            taken no previous anticoagulant or antiplatelet                            agents. ASA Grade Assessment: III - A patient with  severe systemic disease. After reviewing the risks                            and benefits, the patient was deemed in                            satisfactory condition to undergo the procedure.                           After obtaining informed consent, the endoscope was                            passed under direct vision. Throughout the   procedure, the patient's blood pressure, pulse, and                            oxygen saturations were monitored continuously. The                            GIF-H190 ([id number]) Olympus gastroscope was                            introduced through the mouth, and advanced to the                            second part of duodenum. The upper GI endoscopy was                            accomplished without difficulty. The patient                            tolerated the procedure well. Scope In: Scope Out: Findings:      Esophagogastric landmarks were identified: the Z-line was found at 40       cm, the gastroesophageal junction was found at 40 cm and the upper       extent of the gastric folds was found at 40 cm from the incisors.      Very mild / focal esophagitis was found at the GEJ.      Large varices were found in the middle third of the esophagus and in the       lower third of the esophagus. They were large in size, one of which had       a red spot. No active bleeding was noted but given the findings and no       other clear cause for bleeding, they were banded. . Five bands were       successfully placed, resulting in deflation of varices.      The exam of the esophagus was otherwise normal.      Mild inflammation was found in the entire examined stomach consistent       with portal hypertensive gastritis      The exam of the stomach was otherwise normal.      The duodenal bulb and second portion of the duodenum were normal. Impression:               - Esophagogastric landmarks identified.                           -  Mild focal esophagitis at the GEJ                           - Large esophageal varices, one of which had a red                            spot. Banded x 5 with good result.                           - Portal hypertensive gastritis.                           - Normal duodenal bulb and second portion of the                            duodenum.                            Overall, suspect patient had a mild variceal bleed,                            no other significant pathology to cause his                            symptoms. Varices were large with a red spot. Recommendation:           - Return patient to hospital ward for ongoing care.                           - NPO for now.                           - Continue present medications.                           - Would start octreotide given suspected bleeding                            from the varices, continue PPI, will give dose of                            ceftriaxone as well for prophylaxis                           - Trend Hgb                           - Patient will warrant imaging of the liver at some                            point during hospitalization, would recommend MRI                            given prior imaging findings in 2018                           -  GI service will continue to follow Procedure Code(s):        --- Professional ---                           [id number], Esophagogastroduodenoscopy, flexible,                            transoral; with band ligation of esophageal/gastric                            varices Diagnosis Code(s):        --- Professional ---                           K20.9, Esophagitis, unspecified                           I85.00, Esophageal varices without bleeding                           K29.70, Gastritis, unspecified, without bleeding                           K92.0, Hematemesis CPT copyright 2018 American Medical Association. All rights reserved. The codes documented in this report are preliminary and upon coder review may  be revised to meet current compliance requirements. Viviann SpareSteven P. Armbruster, MD 12/21/2018 1:23:59 PM This report has been signed electronically. Number of Addenda: 0

## 2018-12-21 NOTE — H&P (View-Only) (Signed)
Consultation  Referring Provider: Dr. Melina Copa    Primary Care Physician:  Gae Bon, NP Primary Gastroenterologist:Unassigned         Reason for Consultation: Hematemesis and abdominal pain         HPI:   Cameron Coffey is a 58 y.o. male with a past medical history as listed below who presented to the ER today via EMS with a complaint of epigastric abdominal pain that started yesterday and one episode of hematemesis.    Today, the patient explains that the epigastric pain started yesterday around 11:00 in the morning and was "on and off" all day, but got worse overnight, he describes his pain as stabbing and rates it as a 10/10, it was associated with some dry heaves.  He did have one episode of vomiting about a "cup of blood", he tells me this was bright red and it was around 11 PM last night.  Apparently he called the ER and was told to come in.  Denies seeing any melena but describes a foul-smelling stool.  No diarrhea.  Did eat out at [place] yesterday morning.    Per patient history of hepatitis and does describe drinking beer but has not had any in about 10 days as he is "trying to cut down".    Denies fever, chills, weight loss, anorexia, heartburn, reflux, melena or use of NSAIDs.  ED course: Potassium 3.3, AST 109, ALT 66, alk phos 150, total bili 1.5, hemoglobin 12.5, platelets 95, lipase normal, occult blood negative  No previous GI history.  Past Medical History:  Diagnosis Date  . Heart murmur    "born w/one"  . Type II diabetes mellitus ([place])     Past Surgical History:  Procedure Laterality Date  . TEE WITHOUT CARDIOVERSION N/A 08/10/2017   Procedure: TRANSESOPHAGEAL ECHOCARDIOGRAM (TEE);  Surgeon: Lelon Perla, MD;  Location: [facility];  Service: Cardiovascular;  Laterality: N/A;  . TONSILLECTOMY  1982  . TRANSURETHRAL RESECTION OF PROSTATE N/A 07/26/2017   Procedure: TRANSURETHRAL RESECTION OF THE PROSTATE (TURP) WITH UPROOFING OF PROSTATE  ABSCESS;  Surgeon: Irine Seal, MD;  Location: [facility];  Service: Urology;  Laterality: N/A;    Family History  Problem Relation Age of Onset  . Hyperlipidemia Mother   . Diabetes Mother   . Hyperlipidemia Father   . Diabetes Father     Social History   Tobacco Use  . Smoking status: Current Every Day Smoker    Packs/day: 0.50    Years: 32.00    Pack years: 16.00    Types: Cigarettes  . Smokeless tobacco: Never Used  Substance Use Topics  . Alcohol use: Yes    Alcohol/week: 6.0 standard drinks    Types: 6 Cans of beer per week    Comment: "drink qd"  . Drug use: Yes    Types: Marijuana    Comment: pt reports last use several months ago.     Prior to Admission medications   Medication Sig Start Date End Date Taking? Authorizing Provider  amoxicillin (AMOXIL) 500 MG capsule Take 1 capsule (500 mg total) by mouth 3 (three) times daily. 11/02/18   Domenic Moras, PA-C  atorvastatin (LIPITOR) 20 MG tablet Take 20 mg by mouth daily. 07/30/17   [provider]  escitalopram (LEXAPRO) 10 MG tablet Take 10 mg by mouth daily. 08/18/17   [provider]  furosemide (LASIX) 20 MG tablet Take 1 tablet (20 mg total) by mouth daily. Patient not  taking: Reported on 01/25/2018 08/14/17   Silva Zapata, Edwin, MD  glipiZIDE (GLUCOTROL) 10 MG tablet Take 1 tablet (10 mg total) by mouth 2 (two) times daily before a meal. 02/01/18   Baity, Regina W, NP  guaiFENesin-codeine 100-10 MG/5ML syrup Take 5 mLs by mouth every 4 (four) hours as needed. 12/15/18   Summers, Rhonda L, PA-C  lisinopril (PRINIVIL,ZESTRIL) 10 MG tablet Take 10 mg by mouth daily.    [provider]  metFORMIN (GLUCOPHAGE) 1000 MG tablet Take 1 tablet (1,000 mg total) by mouth 2 (two) times daily. COMPLETE PHYSICAL EXAM WITH LABS REQUIRED FOR ADDITIONAL REFILLS Patient taking differently: Take 1,000 mg by mouth 2 (two) times daily.  02/01/18   Baity, Regina W, NP  metoprolol tartrate (LOPRESSOR) 25 MG tablet  Take 0.5 tablets (12.5 mg total) by mouth 2 (two) times daily. 07/29/17   Konidena, Snehalatha, MD    No current facility-administered medications for this encounter.    Current Outpatient Medications  Medication Sig Dispense Refill  . amoxicillin (AMOXIL) 500 MG capsule Take 1 capsule (500 mg total) by mouth 3 (three) times daily. 21 capsule 0  . atorvastatin (LIPITOR) 20 MG tablet Take 20 mg by mouth daily.    . escitalopram (LEXAPRO) 10 MG tablet Take 10 mg by mouth daily.    . furosemide (LASIX) 20 MG tablet Take 1 tablet (20 mg total) by mouth daily. (Patient not taking: Reported on 01/25/2018) 30 tablet   . glipiZIDE (GLUCOTROL) 10 MG tablet Take 1 tablet (10 mg total) by mouth 2 (two) times daily before a meal. 60 tablet 3  . guaiFENesin-codeine 100-10 MG/5ML syrup Take 5 mLs by mouth every 4 (four) hours as needed. 120 mL 0  . lisinopril (PRINIVIL,ZESTRIL) 10 MG tablet Take 10 mg by mouth daily.    . metFORMIN (GLUCOPHAGE) 1000 MG tablet Take 1 tablet (1,000 mg total) by mouth 2 (two) times daily. COMPLETE PHYSICAL EXAM WITH LABS REQUIRED FOR ADDITIONAL REFILLS (Patient taking differently: Take 1,000 mg by mouth 2 (two) times daily. ) 60 tablet 2  . metoprolol tartrate (LOPRESSOR) 25 MG tablet Take 0.5 tablets (12.5 mg total) by mouth 2 (two) times daily. 60 tablet 0    Allergies as of 12/21/2018  . (No Known Allergies)     Review of Systems:    Constitutional: No weight loss, fever or chills Skin: No rash  Cardiovascular: No chest pain Respiratory: No SOB  Gastrointestinal: See HPI and otherwise negative Genitourinary: No dysuria  Neurological: No headache, dizziness or syncope Musculoskeletal: No new muscle or joint pain Hematologic: No bruising Psychiatric: No history of depression or anxiety    Physical Exam:  Vital signs in last 24 hours: Temp:  [98.5 F (36.9 C)] 98.5 F (36.9 C) (02/12 0458) Pulse Rate:  [60] 60 (02/12 0458) Resp:  [18] 18 (02/12 0458) BP:  (164)/(90) 164/90 (02/12 0458) SpO2:  [100 %] 100 % (02/12 0458)   General:   Pleasant AA male appears to be in NAD, Well developed, Well nourished, alert and cooperative Head:  Normocephalic and atraumatic. Eyes:   PEERL, EOMI. No icterus. Conjunctiva pink. Ears:  Normal auditory acuity. Neck:  Supple Throat: Oral cavity and pharynx without inflammation, swelling or lesion. Lungs: Respirations even and unlabored. Lungs clear to auscultation bilaterally.   No wheezes, crackles, or rhonchi.  Heart: Normal S1, S2. No MRG. Regular rate and rhythm. No peripheral edema, cyanosis or pallor.  Abdomen:  Soft, nondistended, moderate epigastric ttp. No rebound or   guarding. Normal bowel sounds. No appreciable masses or hepatomegaly. Rectal:  Not performed.  Msk:  Symmetrical without gross deformities. Peripheral pulses intact.  Extremities:  Without edema, no deformity or joint abnormality. Normal ROM, normal sensation. Neurologic:  Alert and  oriented x4;  grossly normal neurologically.  Skin:   Dry and intact without significant lesions or rashes. Psychiatric: Demonstrates good judgement and reason without abnormal affect or behaviors.   LAB RESULTS: Recent Labs    12/21/18 0453  WBC 3.7*  HGB 12.5*  HCT 37.5*  PLT 95*   BMET Recent Labs    12/21/18 0453  NA 135  K 3.3*  CL 99  CO2 25  GLUCOSE 182*  BUN 7  CREATININE 0.91  CALCIUM 8.4*   LFT Recent Labs    12/21/18 0453  PROT 8.1  ALBUMIN 2.6*  AST 109*  ALT 66*  ALKPHOS 150*  BILITOT 1.5*   PT/INR Recent Labs    12/21/18 0724  LABPROT 16.2*  INR 1.32    Impression / Plan:   Impression: 1.  Hematemesis: 1 episode of about a cup of bright red blood 12/20/2018 around 11 PM, associated with large amount of epigastric pain yesterday, denies NSAID use; consider PUD versus gastritis versus varices given alcohol history 2.  Epigastric pain: As above 3.  Cirrhosis: Seen on CT abdomen pelvis 08/12/2017, also has  thrombocytopenia and elevated LFTs, likely due to alcohol use, does not appear to have been worked up previously   Plan: 1.  Continue to monitor hemoglobin with transfusion as needed less than 7 2.  Agree with Pantoprazole 40 mg twice daily IV 3.  Patient remain n.p.o. for now 4.  We will schedule patient for an EGD this morning with Dr. Armbruster.  Did discuss risks, benefits, limitations and alternatives and patient agrees to proceed. 5.  May need to follow as outpatient for cirrhosis work up 6.  Please await any further recommendations from Dr. Armbruster after procedure.  Thank you for your kind consultation, we will continue to follow.  Jennifer Lynne Lemmon  12/21/2018, 9:18 AM   

## 2018-12-22 ENCOUNTER — Encounter (HOSPITAL_COMMUNITY): Payer: Self-pay | Admitting: Gastroenterology

## 2018-12-22 DIAGNOSIS — I8511 Secondary esophageal varices with bleeding: Secondary | ICD-10-CM

## 2018-12-22 DIAGNOSIS — R188 Other ascites: Secondary | ICD-10-CM

## 2018-12-22 DIAGNOSIS — Z720 Tobacco use: Secondary | ICD-10-CM

## 2018-12-22 LAB — COMPREHENSIVE METABOLIC PANEL
ALBUMIN: 2.5 g/dL — AB (ref 3.5–5.0)
ALT: 63 U/L — ABNORMAL HIGH (ref 0–44)
AST: 108 U/L — ABNORMAL HIGH (ref 15–41)
Alkaline Phosphatase: 97 U/L (ref 38–126)
Anion gap: 12 (ref 5–15)
BILIRUBIN TOTAL: 2 mg/dL — AB (ref 0.3–1.2)
BUN: 6 mg/dL (ref 6–20)
CALCIUM: 8.3 mg/dL — AB (ref 8.9–10.3)
CO2: 25 mmol/L (ref 22–32)
Chloride: 99 mmol/L (ref 98–111)
Creatinine, Ser: 0.86 mg/dL (ref 0.61–1.24)
GFR calc Af Amer: >60 mL/min (ref >60)
GFR calc non Af Amer: >60 mL/min (ref >60)
Glucose, Bld: 142 mg/dL — ABNORMAL HIGH (ref 70–99)
Potassium: 3.4 mmol/L — ABNORMAL LOW (ref 3.5–5.1)
Sodium: 136 mmol/L (ref 135–145)
Total Protein: 7.6 g/dL (ref 6.5–8.1)

## 2018-12-22 LAB — ANTINUCLEAR ANTIBODIES, IFA: ANA Ab, IFA: NEGATIVE

## 2018-12-22 LAB — CBC
HEMATOCRIT: 37.9 % — AB (ref 39.0–52.0)
Hemoglobin: 12.9 g/dL — ABNORMAL LOW (ref 13.0–17.0)
MCH: 30.1 pg (ref 26.0–34.0)
MCHC: 34 g/dL (ref 30.0–36.0)
MCV: 88.6 fL (ref 80.0–100.0)
Platelets: 97 10*3/uL — ABNORMAL LOW (ref 150–400)
RBC: 4.28 MIL/uL (ref 4.22–5.81)
RDW: 16.1 % — ABNORMAL HIGH (ref 11.5–15.5)
WBC: 3.7 10*3/uL — AB (ref 4.0–10.5)
nRBC: 0 % (ref 0.0–0.2)

## 2018-12-22 LAB — GLUCOSE, CAPILLARY
Glucose-Capillary: 141 mg/dL — ABNORMAL HIGH (ref 70–99)
Glucose-Capillary: 269 mg/dL — ABNORMAL HIGH (ref 70–99)
Glucose-Capillary: 124 mg/dL — ABNORMAL HIGH (ref 70–99)
Glucose-Capillary: 186 mg/dL — ABNORMAL HIGH (ref 70–99)

## 2018-12-22 LAB — HCV RNA QUANT
HCV Quantitative Log: 6.772 log10 IU/mL (ref >1.70)
HCV Quantitative: 5920000 IU/mL (ref >50)

## 2018-12-22 LAB — CERULOPLASMIN: Ceruloplasmin: 31.3 mg/dL — ABNORMAL HIGH (ref 16.0–31.0)

## 2018-12-22 LAB — HEPATITIS A ANTIBODY, TOTAL: Hep A Total Ab: NEGATIVE

## 2018-12-22 LAB — HIV ANTIBODY (ROUTINE TESTING W REFLEX): HIV Screen 4th Generation wRfx: NONREACTIVE

## 2018-12-22 MED ORDER — OXYCODONE HCL 5 MG PO TABS: 5.0000 mg | ORAL_TABLET | Freq: Four times a day (QID) | ORAL | Status: DC | PRN

## 2018-12-22 MED ORDER — LISINOPRIL 10 MG PO TABS
10.0000 mg | ORAL_TABLET | Freq: Every day | ORAL | Status: DC
  Administered 2018-12-22 – 2018-12-24 (×3): 10 mg via ORAL
  Filled 2018-12-22 (×3): qty 1

## 2018-12-22 MED ORDER — NICOTINE 14 MG/24HR TD PT24
14.0000 mg | MEDICATED_PATCH | Freq: Every day | TRANSDERMAL | Status: DC
  Filled 2018-12-22: qty 1

## 2018-12-22 MED ORDER — NICOTINE 7 MG/24HR TD PT24
7.0000 mg | MEDICATED_PATCH | Freq: Every day | TRANSDERMAL | Status: DC
  Filled 2018-12-22 (×2): qty 1

## 2018-12-22 NOTE — Progress Notes (Signed)
Daily Rounding Note  12/22/2018, 11:26 AM  LOS: 1 day   SUBJECTIVE:   Chief complaint: Epigastric pain, hematemesis, esophageal varices status post banding.    Patient is having some discomfort in his epigastrium since the banding.  This is not severe.  No nausea or vomiting.  Was very hungry this morning and diet advanced to clears which he is tolerating.  OBJECTIVE:         Vital signs in last 24 hours:    Temp:  [97.7 F (36.5 C)-98.8 F (37.1 C)] 98.8 F (37.1 C) (02/13 0601) Pulse Rate:  [52-71] 55 (02/13 0601) Resp:  [14-32] 18 (02/13 0601) BP: (155-194)/(85-96) 155/96 (02/13 0601) SpO2:  [98 %-100 %] 100 % (02/13 0601) Last BM Date: 12/20/18 Filed Weights   12/21/18 1123  Weight: 59 kg   General: Somewhat chronically ill looking but comfortable. Heart: RRR. Chest: No labored breathing.  Lungs clear bilaterally.  No cough. Abdomen: Soft, minimal epigastric/right upper quadrant tenderness.  No guarding or rebound.  Bowel sounds active. Extremities: CCE. Neuro/Psych: Oriented x3.  No tremor, no asterixis.  Not confused.  Animated  Intake/Output from previous day: 02/12 0701 - 02/13 0700 In: 2150 [I.V.:2150] Out: 700 [Urine:700]  Intake/Output this shift: Total I/O In: 1080 [P.O.:1080] Out: -   Lab Results: Recent Labs    12/21/18 0453 12/21/18 1705 12/22/18 0309  WBC 3.7* 4.2 3.7*  HGB 12.5* 12.8* 12.9*  HCT 37.5* 37.1* 37.9*  PLT 95* 93* 97*   BMET Recent Labs    12/21/18 0453 12/22/18 0309  NA 135 136  K 3.3* 3.4*  CL 99 99  CO2 25 25  GLUCOSE 182* 142*  BUN 7 6  CREATININE 0.91 0.86  CALCIUM 8.4* 8.3*   LFT Recent Labs    12/21/18 0453 12/22/18 0309  PROT 8.1 7.6  ALBUMIN 2.6* 2.5*  AST 109* 108*  ALT 66* 63*  ALKPHOS 150* 97  BILITOT 1.5* 2.0*   PT/INR Recent Labs    12/21/18 0724  LABPROT 16.2*  INR 1.32   Hepatitis Panel No results for input(s): HEPBSAG, HCVAB,  HEPAIGM, HEPBIGM in the last 72 hours.  Studies/Results: Mr Abdomen W Wo Contrast  Result Date: 12/21/2018 CLINICAL DATA:  Inpatient.  Cirrhosis.  Follow-up liver lesion. EXAM: MRI ABDOMEN WITHOUT AND WITH CONTRAST TECHNIQUE: Multiplanar multisequence MR imaging of the abdomen was performed both before and after the administration of intravenous contrast. CONTRAST:  6 cc Gadavist IV. COMPARISON:  08/11/2017 CT abdomen/pelvis. FINDINGS: Lower chest: No acute abnormality at the lung bases. Small lower esophageal varices. Hepatobiliary: Liver surface is diffusely irregular with relative hypertrophy of the lateral segment left liver lobe, compatible with cirrhosis. No hepatic steatosis. There is a 0.6 cm segment 6 right liver lobe lesion (series 11/image 16) with mild T2 hyperintensity and no enhancement, stable in size since 08/11/2017 CT, compatible with a benign proteinaceous cyst. Several nonspecific tiny indistinct subcentimeter foci of vague arterial hyperenhancement at the periphery of the liver, largest 0.7 cm in peripheral right liver lobe (series 17/image 21), which are occult on all other sequences. No overtly suspicious liver masses. Nondistended gallbladder with layering subcentimeter gallstones. Prominent diffuse gallbladder wall thickening. No biliary ductal dilatation. Common bile duct diameter 2 mm. No evidence of choledocholithiasis. Pancreas: No pancreatic mass or duct dilation.  No pancreas divisum. Spleen: Normal size. No mass. Adrenals/Urinary Tract: Normal adrenals. No hydronephrosis. Small simple bilateral renal cysts, largest 1.0 cm in the  posterior interpolar left kidney. No suspicious renal masses. Stomach/Bowel: Normal non-distended stomach. Visualized small and large bowel is normal caliber, with no bowel wall thickening. Vascular/Lymphatic: Atherosclerotic nonaneurysmal abdominal aorta. Patent portal, splenic, hepatic and renal veins. Mild gastrohepatic ligament, porta hepatis and  portacaval adenopathy is stable since 08/11/2017 CT, most compatible with benign reactive adenopathy, for example a 1.5 cm portacaval node (series 5/image 19). No new pathologically enlarged lymph nodes in the abdomen. Other: Trace perihepatic ascites.  No focal fluid collection. Musculoskeletal: No aggressive appearing focal osseous lesions. IMPRESSION: 1. Cirrhosis. No overtly suspicious liver masses. Several subcentimeter scattered indistinct foci of arterial hyperenhancement in the liver, potentially benign transient hepatic intensity differences, for which follow-up MRI abdomen without and with IV contrast is recommended in 6 months. 2. Trace perihepatic ascites. Small esophageal varices. Normal size spleen. 3. Chronic mild reactive upper retroperitoneal adenopathy. 4. Cholelithiasis. Nonspecific prominent diffuse gallbladder wall thickening, probably due to noninflammatory edema given the absence of gallbladder distension. 5.  Aortic Atherosclerosis (ICD10-I70.0). Electronically Signed   By: Delbert Phenix M.D.   On: 12/21/2018 21:39   Scheduled Meds: . insulin aspart  0-9 Units Subcutaneous TID WC  . nicotine  14 mg Transdermal Daily  . octreotide  50 mcg Intravenous Once  . pantoprazole  40 mg Intravenous Q12H   Continuous Infusions: . sodium chloride 50 mL/hr at 12/22/18 1126  . cefTRIAXone (ROCEPHIN)  IV 1 g (12/21/18 1058)  . octreotide  (SANDOSTATIN)    IV infusion 50 mcg/hr (12/22/18 0601)   PRN Meds:.oxyCODONE, polyethylene glycol  ASSESMENT:   *   Hematemesis, gastric pain. 12/21/2018 EGD.  Mild esophagitis at the GE J.  Large esophageal varices, 1 with red wale sign.  3 bands placed.  Portal hypertensive gastritis.  Suspicion is for mild variceal bleed causing hematemesis. On octreotide gtt and PPI IV BID, Rocephin day 2 Fortunately patient has only minor drop and stable Hgb, no coagulopathy.    *     Hepatitis C. never treated.  HCVRNA1,800,000 01/2018.    *      Cirrhosis. 12/21/2018 MRI abdomen.  Confirms cirrhosis, no suspicious liver masses.  Several, subcentimeter scattered foci of arterial hyperenhancement possibly representing benign, transient hepatic intensity difference suggest MRI abdomen in 6 months.  Trace perihepatic ascites.  Small esophageal varices.  Normal sized spleen.  Chronic upper retroperitoneal reactive adenopathy.  Coley lithiasis, nonspecific diffuse gallbladder wall thickening aortic atherosclerosis. Tests ordered to rule out other sources for cirrhosis include ferritin which is elevated at 920.  Ceruloplasmin elevated at 31. Otitis a total antibody negative. ANA, hep B surface antigen, hep B surface antibody, HCV RNA quant pending.  *     Thrombocytopenia.  *     Alcohol abuse, alcoholism.  *     DM.     PLAN   *    Advance to full liquid diet.  Continue PPI , octreotide, Rocephin.  Pending labs.    Jennye Moccasin  12/22/2018, 11:26 AM Phone [phone]

## 2018-12-22 NOTE — Progress Notes (Signed)
Pt requesting to go out to smoke. Explained to pt on hospital rules. Paged MD, pt cannot get out for smoke. Pt very upset and was yelling out. Educated pt. Pt needs further education.

## 2018-12-22 NOTE — Plan of Care (Signed)

## 2018-12-22 NOTE — Progress Notes (Signed)
   Subjective: No overnight events. This morning, Mr. Channer is frustrated that he is only able to eat clear liquids and would prefer an egg with cheese. He reports that he still has some abdominal pain. He had nausea while getting the ceftriaxone, but otherwise denies n/v. He has not had a BM since prior to admission. He smokes 2-3 cigarettes per day.   Objective:  Vital signs in last 24 hours: Vitals:   12/21/18 1325 12/21/18 1446 12/21/18 2136 12/22/18 0601  BP: (!) 189/85 (!) 173/87 (!) 158/96 (!) 155/96  Pulse: (!) 59 (!) 52 (!) 52 (!) 55  Resp: 14 17 (!) 24 18  Temp:  98.5 F (36.9 C) 98.7 F (37.1 C) 98.8 F (37.1 C)  TempSrc:  Oral Oral Oral  SpO2: 98% 100% 100% 100%  Weight:      Height:       Gen: thin man, no distress, walking comfortably around his room CV: RRR, no murmurs Pulm: CTAB, normal effort on room air Abd: soft, non-distended, epigastric ttp Ext: no edema   Assessment/Plan:  Principal Problem:   Upper GI bleed Active Problems:   Cirrhosis of liver 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)   Chronic hepatitis C without hepatic coma (HCC)   Bleeding esophageal varices (HCC)  Mr. Daurio is a 58yo male with a medical history of HTN, DMII, HLD, Hep C, EtOH use who presented with hematemesis and epigastric abdominal pain. His Hb was 12.5 on arrival. He underwent EGD with GI which showed 5 esophageal varices, which were banded.   Hematemesis 2/2 esophageal varices - No further episodes of hematemesis. Patient continues to have some epigastric abdominal pain.  - Hb is stable at 12.9 today.  - Will need to start a beta blocker before discharge  Plan - GI following, appreciate recs - IV protonix BID - Octreotide infusion - Ceftriaxone for SBP ppx - Full liquid diet  - Discontinue IVF - Trend Hb  Cirrhosis HCV - MELD score: 13 - MRI abdomen showed cirrhosis, no hepatic masses, and perihepatic ascites - AST/ALT and total bilirubin continue to be elevated, but are stable  compared to yesterday - ANA negative. Ceruloplasmin barely elevated at 31.3. Hep A negative. Ferritin elevated at 920. - Patient will need to follow-up with GI on discharge for his cirrhosis  Plan - GI following, appreciate recs - F/u HBV, HCV  HTN - Hypertensive to the 150s Plan - Resume home lisinopril 10mg  daily   T2DM - Last A1c was 11.2 two months ago. He is on metformin at home.  Plan - SSI  Tobacco use - Nicotine patch   Dispo: Anticipated discharge in approximately 1-2 day(s).   Dorrell, Cathleen Corti, MD 12/22/2018, 6:49 AM Pager: [phone]

## 2018-12-23 LAB — GLUCOSE, CAPILLARY
Glucose-Capillary: 73 mg/dL (ref 70–99)
Glucose-Capillary: 197 mg/dL — ABNORMAL HIGH (ref 70–99)
Glucose-Capillary: 177 mg/dL — ABNORMAL HIGH (ref 70–99)
Glucose-Capillary: 159 mg/dL — ABNORMAL HIGH (ref 70–99)
Glucose-Capillary: 142 mg/dL — ABNORMAL HIGH (ref 70–99)

## 2018-12-23 LAB — CBC
HCT: 40 % (ref 39.0–52.0)
Hemoglobin: 13.4 g/dL (ref 13.0–17.0)
MCH: 30.2 pg (ref 26.0–34.0)
MCHC: 33.5 g/dL (ref 30.0–36.0)
MCV: 90.1 fL (ref 80.0–100.0)
Platelets: 99 10*3/uL — ABNORMAL LOW (ref 150–400)
RBC: 4.44 MIL/uL (ref 4.22–5.81)
RDW: 15.9 % — ABNORMAL HIGH (ref 11.5–15.5)
WBC: 3.8 10*3/uL — ABNORMAL LOW (ref 4.0–10.5)
nRBC: 0 % (ref 0.0–0.2)

## 2018-12-23 LAB — IRON AND TIBC
Iron: 197 ug/dL — ABNORMAL HIGH (ref 45–182)
Saturation Ratios: 90 % — ABNORMAL HIGH (ref 17.9–39.5)
TIBC: 220 ug/dL — ABNORMAL LOW (ref 250–450)
UIBC: 23 ug/dL

## 2018-12-23 LAB — HEPATITIS B SURFACE ANTIGEN: Hepatitis B Surface Ag: NEGATIVE

## 2018-12-23 LAB — HEPATITIS B SURFACE ANTIBODY,QUALITATIVE: Hep B S Ab: NONREACTIVE

## 2018-12-23 MED ORDER — OXYCODONE HCL 5 MG PO TABS
5.0000 mg | ORAL_TABLET | Freq: Three times a day (TID) | ORAL | Status: DC | PRN
  Administered 2018-12-23 – 2018-12-24 (×2): 5 mg via ORAL
  Filled 2018-12-23 (×3): qty 1

## 2018-12-23 MED ORDER — HEPATITIS A VACCINE 1440 EL U/ML IM SUSP
1.0000 mL | Freq: Once | INTRAMUSCULAR | Status: AC
  Administered 2018-12-23: 1440 [IU] via INTRAMUSCULAR
  Filled 2018-12-23: qty 1

## 2018-12-23 MED ORDER — HEPATITIS B VAC RECOMBINANT 10 MCG/ML IJ SUSP
1.0000 mL | Freq: Once | INTRAMUSCULAR | Status: AC
  Administered 2018-12-23: 10 ug via INTRAMUSCULAR
  Filled 2018-12-23 (×2): qty 1

## 2018-12-23 MED ORDER — PANTOPRAZOLE SODIUM 40 MG PO TBEC
40.0000 mg | DELAYED_RELEASE_TABLET | Freq: Two times a day (BID) | ORAL | Status: DC
  Administered 2018-12-23 – 2018-12-24 (×3): 40 mg via ORAL
  Filled 2018-12-23 (×3): qty 1

## 2018-12-23 MED ORDER — INSULIN ASPART 100 UNIT/ML ~~LOC~~ SOLN
2.0000 [IU] | Freq: Three times a day (TID) | SUBCUTANEOUS | Status: DC
  Administered 2018-12-23 – 2018-12-24 (×3): 2 [IU] via SUBCUTANEOUS

## 2018-12-23 NOTE — Clinical Social Work Note (Signed)
Clinical Social Work Assessment  Patient Details  Name: Cameron Coffey MRN: [id number] Date of Birth: 1961/06/16  Date of referral:  12/23/18               Reason for consult:  Housing Concerns/Homelessness                Permission sought to share information with:    Permission granted to share information::  No   Housing/Transportation Living arrangements for the past 2 months:  Single Family Home Source of Information:  Patient Patient Interpreter Needed:  None Criminal Activity/Legal Involvement Pertinent to Current Situation/Hospitalization:  No - Comment as needed Significant Relationships:  Parents Lives with:  Parents Do you feel safe going back to the place where you live?  Yes Need for family participation in patient care:  No (Coment)  Care giving concerns: Pt from home with his parents, has conflict with his sister and feels he may be out of housing soon. Pt unemployed and hx of heavy ETOH use.   Social Worker assessment / plan: CSW spoke with pt, introduced self and role. Pt had requested CSW speak with him about Section 8. I discussed that Section 8 is managed by [employer]UD and would provide him with that information. Pt states that he lives with his parents but that his sister is "telling them that they can't take care of me anymore." Pt is worried that he will be kicked out of his parents house and asks if I can get him housing. I discuss that I can provide him with multiple shelter and housing resources. Encouraged him to call the housing authority with any additional concerns and needs.   Pt will also need cab voucher to discharge tomorrow.  Employment status:  Unemployed Health and safety inspector:  Medicaid In [place] PT Recommendations:  Not assessed at this time Information / Referral to community resources:  Shelter, Other (Comment Required)(HUD; Housing Resources)  Patient/Family's Response to care:  Pt grateful for brief conversation and resources.  Patient/Family's  Understanding of and Emotional Response to Diagnosis, Current Treatment, and Prognosis:  Pt understanding of his diagnosis, current treatment and prognosis. He seems to not understand why his sister is trying to get his parents to kick him out, but I suspect this may be related to ETOH use and that his parents are aging- pt accuses his sister of using their money to finance her lifestyle.   Provided validation for concerns and will provide resources requested.  Emotional Assessment Appearance:  Appears stated age Attitude/Demeanor/Rapport:  Engaged, Gracious Affect (typically observed):  Accepting, Adaptable, Appropriate Orientation:  Oriented to Place, Oriented to  Time, Oriented to Self, Oriented to Situation Alcohol / Substance use:  Alcohol Use Psych involvement (Current and /or in the community):  No (Comment)  Discharge Needs  Concerns to be addressed:  Homelessness, Care Coordination Readmission within the last 30 days:  No Current discharge risk:  Lack of support system Barriers to Discharge:  Barriers Resolved   Doy Hutching, LCSWA 12/23/2018, 4:44 PM

## 2018-12-23 NOTE — Discharge Summary (Addendum)
Name: Cameron Coffey MRN: [id number] DOB: 06-Apr-1961 58 y.o. PCP: Franciso Bend, NP  Date of Admission: 12/21/2018  4:44 AM Date of Discharge: 12/24/2018 Attending Physician: Dr. Criselda Peaches  Discharge Diagnosis: 1. Hematemesis 2/2 esophageal varices 2. Cirrhosis 2/2 hepatitis C  3. HTN 4. DMII  Discharge Medications: Allergies as of 12/24/2018   No Known Allergies     Medication List    STOP taking these medications   amoxicillin 500 MG capsule Commonly known as:  AMOXIL   furosemide 20 MG tablet Commonly known as:  LASIX   guaiFENesin-codeine 100-10 MG/5ML syrup   metoprolol tartrate 25 MG tablet Commonly known as:  LOPRESSOR     TAKE these medications   atorvastatin 20 MG tablet Commonly known as:  LIPITOR Take 20 mg by mouth daily.   glipiZIDE 10 MG tablet Commonly known as:  GLUCOTROL Take 1 tablet (10 mg total) by mouth 2 (two) times daily before a meal.   lisinopril 10 MG tablet Commonly known as:  PRINIVIL,ZESTRIL Take 10 mg by mouth daily.   metFORMIN 1000 MG tablet Commonly known as:  GLUCOPHAGE Take 1 tablet (1,000 mg total) by mouth 2 (two) times daily. COMPLETE PHYSICAL EXAM WITH LABS REQUIRED FOR ADDITIONAL REFILLS What changed:  additional instructions   oxyCODONE 5 MG immediate release tablet Commonly known as:  Oxy IR/ROXICODONE Take 1 tablet (5 mg total) by mouth every 8 (eight) hours as needed for moderate pain or severe pain.   pantoprazole 40 MG tablet Commonly known as:  PROTONIX Take 1 tablet (40 mg total) by mouth daily.   polyethylene glycol packet Commonly known as:  MIRALAX / GLYCOLAX Take 17 g by mouth daily as needed for mild constipation.       Disposition and follow-up:   [name] was discharged from [facility] in Good condition.  At the hospital follow up visit please address:  1. Hematemesis 2/2 mild esophageal varices - S/p banding  - Patient was started on protonix - Propranolol was  not started during hospitalization for variceal bleeding prophylaxis because the patient was bradycardia. Please monitor his HR and consider starting propranolol 20mg  BID. - He received a 5 day course of abx (ceftriaxone --> augmentin) for ppx against SBP - Please ensure he has appropriate f/u with GI for repeat endoscopy  2. Cirrhosis 2/2 hepatitis C  - Referred to ID for HCV therapy. Please ensure appropriate f/u.  - Received dose 1/3 of the hep B vaccine. Will need doses in 1 month and 6 months. - Received dose 1/2 of the hep A vaccine. Will need repeat dose in 6-12 months.  3.  Labs / imaging needed at time of follow-up: repeat endoscopy with GI  4.  Pending labs/ test needing follow-up: Hep C genotype  Follow-up Appointments: Follow-up Information    Armbruster, Cameron Rayas, MD Follow up on 01/13/2019.   Specialty:  Gastroenterology Why:  2:45 appointment with liver doctor.  office is across the street from [facility].   Contact information: [address] [place] [place] [address] [phone]        [facility]              Follow up.   Why:  Call [phone] to make an appointment Contact information: [address] [place] [address]          Hospital Course by problem list: 1. Hematemesis 2/2 esophageal varices:  Mr. Cameron Coffey is a 58yo male witha medical historyof HTN,DMII,HLD, Hep C, EtOH use whopresentedwith hematemesis and epigastric abdominal pain. His Hb was 12.5 on arrival. He underwent EGD with GI which showed 5 esophageal varices, one of which had a red spot. The varices were thought to be the source of the mild bleed. The varices were banded. He was treated with a 5 day course of antibiotics (ceftriaxone --> augmentin), IV PPI (transitioned to PO on discharge), and octreotide. He was not started on propranolol during this admission 2/2 bradycardia. His hemoglobin was stable and he did not  require transfusion. Hb at discharge was 12.2. He was discharged home with f/u scheduled with GI for repeat endoscopy.   2. Cirrhosis 2/2 hepatitis C: Patient has never been treated for HCV. Hep C RNA quant > 5,000,000. Negative for hep A and hep B. MRI abdomen showed cirrhosis without hepatic masses. He received dose 1/3 of hepatitis b vaccine and dose 1/2 of the hepatitis A vaccine. He will need f/u with his PCP or ID for the rest of the doses. He was referred to ID at discharge for HCV therapy.  3. HTN: Continued home lisinopril. Added propranolol.   4. DMII: Blood sugars were well-controlled with mealtime insulin and SSI.   Discharge Vitals:   BP (!) 157/90 (BP Location: Right Arm)   Pulse (!) 53   Temp 98.8 F (37.1 C) (Oral)   Resp 18   Ht 5\' 9"  (1.753 m)   Wt 59 kg   SpO2 100%   BMI 19.20 kg/m   Pertinent Labs, Studies, and Procedures:  CBC Latest Ref Rng & Units 12/24/2018 12/23/2018 12/22/2018  WBC 4.0 - 10.5 K/uL 4.7 3.8(L) 3.7(L)  Hemoglobin 13.0 - 17.0 g/dL 12.2(L) 13.4 12.9(L)  Hematocrit 39.0 - 52.0 % 36.2(L) 40.0 37.9(L)  Platelets 150 - 400 K/uL 93(L) 99(L) 97(L)   CMP Latest Ref Rng & Units 12/22/2018 12/21/2018 11/02/2018  Glucose 70 - 99 mg/dL 161(W142(H) 960(A182(H) 540(JW767(HH)  BUN 6 - 20 mg/dL 6 7 7   Creatinine 0.61 - 1.24 mg/dL 1.190.86 1.470.91 8.290.92  Sodium 135 - 145 mmol/L 136 135 127(L)  Potassium 3.5 - 5.1 mmol/L 3.4(L) 3.3(L) 5.3(H)  Chloride 98 - 111 mmol/L 99 99 88(L)  CO2 22 - 32 mmol/L 25 25 25   Calcium 8.9 - 10.3 mg/dL 8.3(L) 8.4(L) 8.6(L)  Total Protein 6.5 - 8.1 g/dL 7.6 8.1 5.6(O8.3(H)  Total Bilirubin 0.3 - 1.2 mg/dL 2.0(H) 1.5(H) 2.2(H)  Alkaline Phos 38 - 126 U/L 97 150(H) 143(H)  AST 15 - 41 U/L 108(H) 109(H) 201(H)  ALT 0 - 44 U/L 63(H) 66(H) 149(H)   MRI Abdomen 1. Cirrhosis. No overtly suspicious liver masses. Several subcentimeter scattered indistinct foci of arterial hyperenhancement in the liver, potentially benign transient hepatic intensity differences,  for which follow-up MRI abdomen without and with IV contrast is recommended in 6 months. 2. Trace perihepatic ascites. Small esophageal varices. Normal size spleen. 3. Chronic mild reactive upper retroperitoneal adenopathy. 4. Cholelithiasis. Nonspecific prominent diffuse gallbladder wall thickening, probably due to noninflammatory edema given the absence of gallbladder distension.  EGD - Esophagogastric landmarks identified. - Mild focal esophagitis at the GEJ - Large esophageal varices, one of which had a red spot. Banded x 5 with good result. - Portal hypertensive gastritis. - Normal duodenal bulb and second portion of the duodenum.  Discharge Instructions: Discharge Instructions    Ambulatory referral to Infectious Disease   Complete by:  As directed    Hepatitis C  and cirrhosis   Discharge instructions   Complete by:  As directed    It was a pleasure taking care of you while you were in the hospital, Mr. Maliszewski!  1. You were hospitalized for bleeding of large vessels in your esophagus. These were banded during a surgical procedure with the GI doctors. You will need to follow-up with GI. Your appointment with them has been scheduled.  2. This bleeding happened because your liver is damaged from the hepatitis C virus. You received vaccinations against Hep A and Hep B while you were in the hospital. You will need to get the remaining doses of this vaccine with your PCP. I have made a referral to the infectious disease clinic so that you can get treatment. Their office will call you to schedule an appointment.   3. Changes to your medications - You need to take one more day of antibiotics. Take one dose of augmentin tomorrow. - Start taking Protonix (which is a stomach acid suppressant) daily  - If you have pain in your stomach, you can take oxycodone 5mg  every 8 hours as needed. The pain should improve over the next day or so.  - Avoid taking NSAIDs like motrin, advil, and aleve.  Avoid aspirin. These medications may make you more susceptible to bleeding.   4. You should follow-up with your PCP in the next 1-2 weeks.  Thanks, Dr. Avie Arenas   Increase activity slowly   Complete by:  As directed       Signed: Robecca Fulgham, Cathleen Corti, MD 12/24/2018, 9:22 AM   Pager: [phone]

## 2018-12-23 NOTE — Consult Note (Addendum)
Daily Rounding Note  12/23/2018, 8:39 AM  LOS: 2 days   SUBJECTIVE:   Chief complaint: esophageal variceal bleed   Stools are light to medium brown, they were never melenic.  No epigastric pain or nausea.   Eating solids.  No weak or dizzy.  Feels well.    OBJECTIVE:         Vital signs in last 24 hours:    Temp:  [98.6 F (37 C)-98.9 F (37.2 C)] 98.7 F (37.1 C) (02/14 0650) Pulse Rate:  [48-53] 48 (02/14 0650) Resp:  [14-17] 14 (02/14 0650) BP: (129-155)/(84-97) 140/97 (02/14 0650) SpO2:  [100 %] 100 % (02/14 0650) Last BM Date: 12/23/18 Filed Weights   12/21/18 1123  Weight: 59 kg   General: alert, comfortable.  Looks well   Heart: RRR Chest: clear bil Abdomen: soft, NT, ND.  Active BS  Extremities: no CCE Neuro/Psych:  Oriented x 3.  Appropriate.  No tremor or weakness.    Intake/Output from previous day: 02/13 0701 - 02/14 0700 In: 2161.3 [P.O.:1670; I.V.:391.3; IV Piggyback:100] Out: 725 [Urine:725]  Intake/Output this shift: No intake/output data recorded.  Lab Results: Recent Labs    12/21/18 1705 12/22/18 0309 12/23/18 0237  WBC 4.2 3.7* 3.8*  HGB 12.8* 12.9* 13.4  HCT 37.1* 37.9* 40.0  PLT 93* 97* 99*   BMET Recent Labs    12/21/18 0453 12/22/18 0309  NA 135 136  K 3.3* 3.4*  CL 99 99  CO2 25 25  GLUCOSE 182* 142*  BUN 7 6  CREATININE 0.91 0.86  CALCIUM 8.4* 8.3*   LFT Recent Labs    12/21/18 0453 12/22/18 0309  PROT 8.1 7.6  ALBUMIN 2.6* 2.5*  AST 109* 108*  ALT 66* 63*  ALKPHOS 150* 97  BILITOT 1.5* 2.0*   PT/INR Recent Labs    12/21/18 0724  LABPROT 16.2*  INR 1.32   Hepatitis Panel Recent Labs    12/22/18 0309  HEPBSAG Negative    Studies/Results: Mr Abdomen W Wo Contrast  Result Date: 12/21/2018 CLINICAL DATA:  Inpatient.  Cirrhosis.  Follow-up liver lesion. EXAM: MRI ABDOMEN WITHOUT AND WITH CONTRAST TECHNIQUE: Multiplanar multisequence MR  imaging of the abdomen was performed both before and after the administration of intravenous contrast. CONTRAST:  6 cc Gadavist IV. COMPARISON:  08/11/2017 CT abdomen/pelvis. FINDINGS: Lower chest: No acute abnormality at the lung bases. Small lower esophageal varices. Hepatobiliary: Liver surface is diffusely irregular with relative hypertrophy of the lateral segment left liver lobe, compatible with cirrhosis. No hepatic steatosis. There is a 0.6 cm segment 6 right liver lobe lesion (series 11/image 16) with mild T2 hyperintensity and no enhancement, stable in size since 08/11/2017 CT, compatible with a benign proteinaceous cyst. Several nonspecific tiny indistinct subcentimeter foci of vague arterial hyperenhancement at the periphery of the liver, largest 0.7 cm in peripheral right liver lobe (series 17/image 21), which are occult on all other sequences. No overtly suspicious liver masses. Nondistended gallbladder with layering subcentimeter gallstones. Prominent diffuse gallbladder wall thickening. No biliary ductal dilatation. Common bile duct diameter 2 mm. No evidence of choledocholithiasis. Pancreas: No pancreatic mass or duct dilation.  No pancreas divisum. Spleen: Normal size. No mass. Adrenals/Urinary Tract: Normal adrenals. No hydronephrosis. Small simple bilateral renal cysts, largest 1.0 cm in the posterior interpolar left kidney. No suspicious renal masses. Stomach/Bowel: Normal non-distended stomach. Visualized small and large bowel is normal caliber, with no bowel wall thickening. Vascular/Lymphatic: Atherosclerotic nonaneurysmal  abdominal aorta. Patent portal, splenic, hepatic and renal veins. Mild gastrohepatic ligament, porta hepatis and portacaval adenopathy is stable since 08/11/2017 CT, most compatible with benign reactive adenopathy, for example a 1.5 cm portacaval node (series 5/image 19). No new pathologically enlarged lymph nodes in the abdomen. Other: Trace perihepatic ascites.  No focal  fluid collection. Musculoskeletal: No aggressive appearing focal osseous lesions. IMPRESSION: 1. Cirrhosis. No overtly suspicious liver masses. Several subcentimeter scattered indistinct foci of arterial hyperenhancement in the liver, potentially benign transient hepatic intensity differences, for which follow-up MRI abdomen without and with IV contrast is recommended in 6 months. 2. Trace perihepatic ascites. Small esophageal varices. Normal size spleen. 3. Chronic mild reactive upper retroperitoneal adenopathy. 4. Cholelithiasis. Nonspecific prominent diffuse gallbladder wall thickening, probably due to noninflammatory edema given the absence of gallbladder distension. 5.  Aortic Atherosclerosis (ICD10-I70.0). Electronically Signed   By: Delbert Phenix M.D.   On: 12/21/2018 21:39    ASSESMENT:   *   Hematemesis, resolved. 12/21/2018 EGD.  Mild esophagitis at the GEJ.  Large esophageal varices, 1 with red wale sign.  3 bands placed.  Portal hypertensive gastritis.  Suspect mild variceal bleed causing hematemesis. On octreotide gtt and PPI IV BID, Rocephin day 3 Fortunately patient has only minor drop and stable Hgb, no coagulopathy.    *     Hepatitis C. never treated.  HCV RNA 1,800,000 01/2018.    *     Cirrhosis. 12/21/2018 MRI abdomen.  Confirms cirrhosis, no suspicious liver masses.  Several, subcentimeter scattered foci of arterial hyperenhancement possibly representing benign, transient hepatic intensity difference suggest MRI abdomen in 6 months.  Trace perihepatic ascites.  Small esophageal varices.  Normal sized spleen.  Chronic upper retroperitoneal reactive adenopathy.  Coley lithiasis, nonspecific diffuse gallbladder wall thickening aortic atherosclerosis. Tests ordered to rule out other sources for cirrhosis include ferritin which is elevated at 920.  Ceruloplasmin elevated at 31. Hep A total antibody negative. ANA negative, hep B surface Ag negative, hep B surface antibody non-reactive,  HCV RNA quant 5,920,000.  Ferritin 920. Iron (slightly) and Iron sat elevated. TIBC low.  IgG pending  *     Thrombocytopenia.  *     Alcohol abuse, alcoholism.  Pt aware and motivated to quit.    *     DM.      PLAN   *   Start Propanolol (10 mg BID to start). ? Should we do this now to ascertain that his BP will tolerate?   Begin Hep A and B vaccination: Family medicine should coordinate and document this.    *  Octreotide finishes tomorrow at 11 AM. ? Finish sooner?    Switch to oral PPI BID Finish 5 days of abx (can use Augmentin po if discharges sooner).    *    CBC in AM.     *   Outpt referral to ID for tx of Hep C.    *   Total ETOH abstinence.    *    Repeat EGD in ~ 2 to 4 weeks to reassess and reband varices.  Will set up ROV with GI at office and schedule EGD from there.  Dr Adela Lank is now his GI MD.         Jennye Moccasin  12/23/2018, 8:39 AM Phone [phone]

## 2018-12-23 NOTE — Progress Notes (Signed)
   Subjective: No overnight events. Cameron Coffey reports that he feels much better now that he can eat. He denies nausea and vomiting. Has mild epigastric pain. He has no acute concerns today. All of his questions were answered.  Objective:  Vital signs in last 24 hours: Vitals:   12/21/18 2136 12/22/18 0601 12/22/18 1359 12/22/18 2233  BP: (!) 158/96 (!) 155/96 (!) 155/95 129/84  Pulse: (!) 52 (!) 55 (!) 53 (!) 51  Resp: (!) 24 18 17 14   Temp: 98.7 F (37.1 C) 98.8 F (37.1 C) 98.9 F (37.2 C) 98.6 F (37 C)  TempSrc: Oral Oral Oral Oral  SpO2: 100% 100% 100% 100%  Weight:      Height:       Gen: lying in bed reading the newspaper, no distress CV: RRR, no murmurs Pulm: CTAB, normal effort on room air Abd: soft, non-distended, mild epigastric ttp Ext: no edema  Assessment/Plan:  Principal Problem:   Upper GI bleed Active Problems:   Cirrhosis of liver 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)   Chronic hepatitis C without hepatic coma (HCC)   Bleeding esophageal varices (HCC)  Cameron Coffey is a 58yo male with a medical history of HTN, DMII, HLD, Hep C, EtOH use who presented with hematemesis and epigastric abdominal pain. His Hb was 12.5 on arrival. He underwent EGD with GI which showed 5 esophageal varices, which were banded.   Hematemesis 2/2 esophageal varices - No further episodes of hematemesis. Patient continues to have mild epigastric pain, but has not required any analgesics.  - Hb is stable at 13.4 today.  - Will need to start a beta blocker before discharge  Plan - GI following, appreciate recs - Transition to PO protonix BID. Can transition to once daily at discharge. - Continue Octreotide drip until noon tomorrow - Continue ceftriaxone for SBP ppx (day 3/5). Can get 4th dose of ceftriaxone tomorrow and transition to PO augmentin on discharge. - Will start propranolol 20mg  BID at discharge - Carb modified diet - F/u appointment with GI for repeat endoscopy   Cirrhosis HCV -  MRI abdomen showed cirrhosis, no hepatic masses, and perihepatic ascites - ANA negative. Ceruloplasmin barely elevated at 31.3. Hep A negative. Ferritin elevated at 920. - Hep B negative. Hep C RNA quant > 5,000,000 Plan - GI following, appreciate recs - Hep C genotype pending - Needs Hep A and Hep B vaccines  - Needs follow-up liver imaging in 6 months - Needs referral to ID for HCV therapy  HTN - Hypertensive to 140/97 this am Plan - Continue home lisinopril 10mg  daily  - Plan to start propranolol tomorrow  T2DM - Last A1c was 11.2 two months ago. He is on metformin at home.  - Blood sugars above goal Plan - Add 2u novolog with meals - SSI  Dispo: Anticipated discharge tomorrow  Cameron Coffey, Cathleen Corti, MD 12/23/2018, 6:39 AM Pager: [phone]

## 2018-12-24 LAB — CBC
HCT: 36.2 % — ABNORMAL LOW (ref 39.0–52.0)
Hemoglobin: 12.2 g/dL — ABNORMAL LOW (ref 13.0–17.0)
MCH: 31 pg (ref 26.0–34.0)
MCHC: 33.7 g/dL (ref 30.0–36.0)
MCV: 91.9 fL (ref 80.0–100.0)
Platelets: 93 10*3/uL — ABNORMAL LOW (ref 150–400)
RBC: 3.94 MIL/uL — ABNORMAL LOW (ref 4.22–5.81)
RDW: 15.7 % — ABNORMAL HIGH (ref 11.5–15.5)
WBC: 4.7 10*3/uL (ref 4.0–10.5)
nRBC: 0 % (ref 0.0–0.2)

## 2018-12-24 LAB — GLUCOSE, CAPILLARY
GLUCOSE-CAPILLARY: 190 mg/dL — AB (ref 70–99)
Glucose-Capillary: 111 mg/dL — ABNORMAL HIGH (ref 70–99)

## 2018-12-24 LAB — HEPATITIS B SURFACE ANTIGEN: Hepatitis B Surface Ag: NEGATIVE

## 2018-12-24 LAB — HEPATITIS B SURFACE ANTIBODY,QUALITATIVE: Hep B S Ab: NONREACTIVE

## 2018-12-24 LAB — IGG: IGG (IMMUNOGLOBIN G), SERUM: 3006 mg/dL — AB (ref 700–1600)

## 2018-12-24 MED ORDER — OXYCODONE HCL 5 MG PO TABS: 5.0000 mg | ORAL_TABLET | Freq: Three times a day (TID) | ORAL | 0 refills | Status: DC | PRN

## 2018-12-24 MED ORDER — AMOXICILLIN-POT CLAVULANATE 875-125 MG PO TABS: 1.0000 | ORAL_TABLET | Freq: Two times a day (BID) | ORAL | 0 refills | Status: AC

## 2018-12-24 MED ORDER — PANTOPRAZOLE SODIUM 40 MG PO TBEC: 40.0000 mg | DELAYED_RELEASE_TABLET | Freq: Every day | ORAL | 0 refills | Status: DC

## 2018-12-24 MED ORDER — POLYETHYLENE GLYCOL 3350 17 G PO PACK: 17.0000 g | PACK | Freq: Every day | ORAL | 0 refills | Status: DC | PRN

## 2018-12-24 NOTE — Progress Notes (Signed)
   Subjective: No overnight events. Cameron Coffey reports that he feels well this morning. No nausea, vomiting, hematochezia, or melena. He endorses some epigastric pain that resolves with oxycodone. He has no acute concerns. All of his questions were answered.   Objective:  Vital signs in last 24 hours: Vitals:   12/23/18 1031 12/23/18 1421 12/23/18 2140 12/24/18 0445  BP: 139/84 (!) 136/107 (!) 148/85 134/77  Pulse:  (!) 51 (!) 50 (!) 46  Resp:  16 18 14   Temp:  98.5 F (36.9 C) 98.5 F (36.9 C) 98.4 F (36.9 C)  TempSrc:  Oral Oral Oral  SpO2:  100% 100% 100%  Weight:      Height:       Gen: lying comfortably in bed, no distress CV: RRR, no murmurs Pulm: CTAB, normal effort on room air Abd: bowel sounds present, soft, nondistended, epigastric ttp Ext: no edema  Assessment/Plan:  Principal Problem:   Upper GI bleed Active Problems:   Cirrhosis of liver 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)   Chronic hepatitis C without hepatic coma (HCC)   Bleeding esophageal varices (HCC)  Cameron Coffey is a 58yo male witha medical historyof HTN,DMII,HLD, Hep C, EtOH use whopresentedwith hematemesis and epigastric abdominal pain. His Hb was 12.5 on arrival. He underwent EGD with GI which showed 5 esophageal varices, which were banded.  Hematemesis 2/2 esophageal varices - No further episodes of hematemesis. Patient continues to have mild epigastric pain that responds well to analgesia.  - Hb is stable at 12.2 today - Given his bradycardia, will not start propranolol today. Will defer this to PCP or GI on f/u.  - GI has signed off Plan - PO protonix BID. Will transition to once daily at discharge. - Continue Octreotide drip until noon today - Continue ceftriaxone for SBP ppx (day 4/5). Transition to PO augmentin on discharge for total abx course of 5 days - Carb modified diet - Pain control: oxycodone 5mg  q8hrs prn  - F/u appointment with GI for repeat endoscopy  - Medically stable for discharge  home today  Cirrhosis HCV - MRI abdomen showed cirrhosis, no hepatic masses, and perihepatic ascites. Hep C RNA quant > 5,000,000. - Received Hep A and Hep B vaccines on 2/14 Plan - Hep C genotype pending - Will place referral to ID for HCV therapy - Needs follow-up liver imaging in 6 months  HTN - BP 134/77 this am  Plan - Continue home lisinopril 10mg  daily  T2DM - Last A1c was 11.2 two months ago. He is on metformin at home.  - Blood sugars within goal Plan - Add 2u novolog with meals - SSI  Dispo: Anticipated discharge today.  Cameron Coffey, Cameron Corti, MD 12/24/2018, 9:07 AM Pager: [phone]

## 2018-12-24 NOTE — Plan of Care (Signed)
  Problem: Education: Goal: Knowledge of General Education information will improve Description Including pain rating scale, medication(s)/side effects and non-pharmacologic comfort measures Outcome: Adequate for Discharge   Problem: Health Behavior/Discharge Planning: Goal: Ability to manage health-related needs will improve Outcome: Adequate for Discharge   Problem: Clinical Measurements: Goal: Ability to maintain clinical measurements within normal limits will improve Outcome: Adequate for Discharge Goal: Will remain free from infection Outcome: Adequate for Discharge Goal: Diagnostic test results will improve Outcome: Adequate for Discharge Goal: Respiratory complications will improve Outcome: Adequate for Discharge Goal: Cardiovascular complication will be avoided Outcome: Adequate for Discharge   Problem: Activity: Goal: Risk for activity intolerance will decrease Outcome: Adequate for Discharge   Problem: Nutrition: Goal: Adequate nutrition will be maintained Outcome: Adequate for Discharge   Problem: Coping: Goal: Level of anxiety will decrease Outcome: Adequate for Discharge   Problem: Elimination: Goal: Will not experience complications related to bowel motility Outcome: Adequate for Discharge Goal: Will not experience complications related to urinary retention Outcome: Adequate for Discharge   Problem: Pain Managment: Goal: General experience of comfort will improve Outcome: Adequate for Discharge   Problem: Safety: Goal: Ability to remain free from injury will improve Outcome: Adequate for Discharge   Problem: Skin Integrity: Goal: Risk for impaired skin integrity will decrease Outcome: Adequate for Discharge   Problem: Bowel/Gastric: Goal: Will show no signs and symptoms of gastrointestinal bleeding Outcome: Adequate for Discharge   Problem: Fluid Volume: Goal: Will show no signs and symptoms of excessive bleeding Outcome: Adequate for  Discharge   Problem: Clinical Measurements: Goal: Complications related to the disease process, condition or treatment will be avoided or minimized Outcome: Adequate for Discharge

## 2018-12-24 NOTE — Plan of Care (Signed)
  Problem: Education: Goal: Knowledge of General Education information will improve Description Including pain rating scale, medication(s)/side effects and non-pharmacologic comfort measures Outcome: Progressing   Problem: Health Behavior/Discharge Planning: Goal: Ability to manage health-related needs will improve Outcome: Progressing   Problem: Clinical Measurements: Goal: Ability to maintain clinical measurements within normal limits will improve Outcome: Progressing Goal: Will remain free from infection Outcome: Progressing   Problem: Activity: Goal: Risk for activity intolerance will decrease Outcome: Progressing   Problem: Nutrition: Goal: Adequate nutrition will be maintained Outcome: Progressing   Problem: Coping: Goal: Level of anxiety will decrease Outcome: Progressing   Problem: Elimination: Goal: Will not experience complications related to bowel motility Outcome: Progressing Goal: Will not experience complications related to urinary retention Outcome: Progressing   Problem: Pain Managment: Goal: General experience of comfort will improve Outcome: Progressing   Problem: Safety: Goal: Ability to remain free from injury will improve Outcome: Progressing   Problem: Skin Integrity: Goal: Risk for impaired skin integrity will decrease Outcome: Progressing   Problem: Bowel/Gastric: Goal: Will show no signs and symptoms of gastrointestinal bleeding Outcome: Progressing   Problem: Clinical Measurements: Goal: Complications related to the disease process, condition or treatment will be avoided or minimized Outcome: Progressing

## 2018-12-24 NOTE — Progress Notes (Signed)
Patient discharged via taxi cab. Taxi was call for patient. Discharge instruction given to patient, all questions answered and concerns addressed. Patient verbalized understanding.

## 2018-12-26 ENCOUNTER — Telehealth: Payer: Self-pay

## 2018-12-26 NOTE — Telephone Encounter (Signed)
Pt has an appt with Dr. Adela Lank on 3-6. Pt placed on hospital procedure list.

## 2018-12-26 NOTE — Telephone Encounter (Signed)
-----   Message from Benancio Deeds, MD sent at 12/23/2018 12:15 PM EST ----- Regarding: hospital case Hi Jan, Need to put this patient on the list for hospital cases, we can discuss scheduling when I get back next week. He will need an office follow up in 4-6 weeks as well as EGD. Thanks

## 2018-12-27 LAB — HEPATITIS C GENOTYPE

## 2018-12-28 ENCOUNTER — Telehealth: Payer: Self-pay

## 2018-12-28 ENCOUNTER — Telehealth: Payer: Self-pay | Admitting: Gastroenterology

## 2018-12-28 NOTE — Telephone Encounter (Signed)
Pt called back.  He cannot come on March 9th for an EGD due to transportation. He would like to be added to the April list.  He will see [place] on March 6th for his OV with Dr. Adela Lank.

## 2018-12-28 NOTE — Telephone Encounter (Signed)
[facility] (spoke to [place]) [phone] and  they can arrange to get the pt to and from for his procedure on 01-16-19 at [facility]. Pt informed. He will be here on March 6th for an OV and will be instructed at that time.

## 2018-12-28 NOTE — Telephone Encounter (Signed)
Called and spoke to pt. Confirmed appt on 3-6 with Dr. Mervyn Skeeters and I let him know we have him scheduled for a follow up EGD at [facility] on 01-16-2019.  He was not sure he could get there by 6:45am for an 8:15am procedure time due to transportation.  Will call me back to confirm.

## 2018-12-28 NOTE — Telephone Encounter (Signed)
See other phone note. Pt scheduled.

## 2018-12-28 NOTE — Telephone Encounter (Signed)
Pt scheduled for EGD at [facility] on 01-16-2019 at 8:15am to arrive at 6:45am.

## 2018-12-28 NOTE — Telephone Encounter (Signed)
Pt would like to speak to you about possible changing his appointment at [facility].

## 2018-12-28 NOTE — Telephone Encounter (Signed)
Okay. Unfortunately I don't think he can wait until April when I don't have those dates available yet. If there is any way he can arrange for transport for that day in March it would be best. thanks

## 2019-01-12 ENCOUNTER — Other Ambulatory Visit: Payer: Self-pay

## 2019-01-12 ENCOUNTER — Encounter (HOSPITAL_COMMUNITY): Payer: Self-pay | Admitting: *Deleted

## 2019-01-13 ENCOUNTER — Encounter: Payer: Self-pay | Admitting: Gastroenterology

## 2019-01-13 ENCOUNTER — Ambulatory Visit (INDEPENDENT_AMBULATORY_CARE_PROVIDER_SITE_OTHER): Payer: Medicaid Other | Admitting: Gastroenterology

## 2019-01-13 ENCOUNTER — Other Ambulatory Visit: Payer: Medicaid Other

## 2019-01-13 VITALS — BP 118/70 | HR 64 | Ht 68.5 in | Wt 156.5 lb

## 2019-01-13 DIAGNOSIS — K7460 Unspecified cirrhosis of liver: Secondary | ICD-10-CM

## 2019-01-13 DIAGNOSIS — I8501 Esophageal varices with bleeding: Secondary | ICD-10-CM | POA: Diagnosis not present

## 2019-01-13 DIAGNOSIS — B182 Chronic viral hepatitis C: Secondary | ICD-10-CM | POA: Diagnosis not present

## 2019-01-13 DIAGNOSIS — B1920 Unspecified viral hepatitis C without hepatic coma: Secondary | ICD-10-CM

## 2019-01-13 MED ORDER — PROPRANOLOL HCL 10 MG PO TABS: 10.0000 mg | ORAL_TABLET | Freq: Two times a day (BID) | ORAL | 1 refills | Status: AC

## 2019-01-13 NOTE — Patient Instructions (Signed)
If you are age 57 or older, your body mass index should be between 23-30. Your Body mass index is 23.45 kg/m. If this is out of the aforementioned range listed, please consider follow up with your Primary Care Provider.  If you are age 43 or younger, your body mass index should be between 19-25. Your Body mass index is 23.45 kg/m. If this is out of the aformentioned range listed, please consider follow up with your Primary Care Provider.   You have been scheduled for an endoscopy. Please follow written instructions given to you at your visit today. If you use inhalers (even only as needed), please bring them with you on the day of your procedure. Your physician has requested that you go to www.startemmi.com and enter the access code given to you at your visit today. This web site gives a general overview about your procedure. However, you should still follow specific instructions given to you by our office regarding your preparation for the procedure.  Please go to the lab in the basement of our building to have lab work done as you leave today. Hit "B" for basement when you get on the elevator.  When the doors open the lab is on your left.  We will call you with the results. Thank you.  We have sent the following medications to your pharmacy for you to pick up at your convenience: Propranolol 10mg : Take twice a day   We are referring you to Infectious Disease to be treated for Hepatitis C. They will call you to schedule an appointment.    You will be due for an MRI of the Liver in August 2020.  They will call you to schedule that appointment.

## 2019-01-13 NOTE — H&P (View-Only) (Signed)
HPI :  58 y/o male here for a follow up visit.  I met him when I took care of him at [facility] in February. He presented with hematemesis and a mild anemia. He has a history of untreated hep C and cirrhosis which he was not aware of at the time. EGD showed large esophageal varices, one of which had a red marking, treated with banding x 5. Suspect he had a mild variceal bleed. He was admitted for a few days on IV octreotide and protonix. He had an MRI done during his inpatient stay as well as serologic workup for other liver diseases. MRI as outlined below, radiology recommending 6 month follow up exam. No obvious evidence of malignancy. 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 Ceruloplasmin negative. He recovered well and was supposed to be placed on propranolol at discharge but was not, not sure why.   He states in general he's been feeling much better since he's been discharged. No further bleeding symptoms. His bowels are okay without any blood. He does have some occasional dysphagia since the banding was performed. He denies any heartburn or reflux symptoms. He does admit to some social alcohol use, often over the weekend, he will have upwards of 4 beers per day. He states he does not drink during the week. He denies any significant alcohol use historically.  EGD 12/21/2018 - large esophageal varices, one of which with red marking, 5 bands placed, portal hypertensive gastritis   MRI abdomen 12/21/18 - no liver masses but several subcm indistinct foci of arterial hyperenhancement in the liver, potentially benign transient hepatic intensity differences, for which follow-up MRI abdomen without and with IV contrast is recommended in 6 months. 2. Trace perihepatic ascites. Small esophageal varices. Normal size spleen. 3. Chronic mild reactive upper retroperitoneal  adenopathy. 4. Cholelithiasis. Nonspecific prominent diffuse gallbladder wall thickening, probably due to noninflammatory edema given the absence of gallbladder distension. 5.  Aortic Atherosclerosis (ICD10-I70.0).   Past Medical History:  Diagnosis Date  . Cirrhosis ([place])   . Cirrhosis ([place])   . Depression   . HCV (hepatitis C virus)   . Heart murmur    "born w/one"  . Hypertension   . Type II diabetes mellitus ([place])      Past Surgical History:  Procedure Laterality Date  . ESOPHAGEAL BANDING  12/21/2018   Procedure: ESOPHAGEAL BANDING;  Surgeon: Yetta Flock, MD;  Location: [facility] ENDOSCOPY;  Service: Gastroenterology;;  . ESOPHAGOGASTRODUODENOSCOPY (EGD) WITH PROPOFOL N/A 12/21/2018   Procedure: ESOPHAGOGASTRODUODENOSCOPY (EGD) WITH PROPOFOL;  Surgeon: Yetta Flock, MD;  Location: [place];  Service: Gastroenterology;  Laterality: N/A;  . TEE WITHOUT CARDIOVERSION N/A 08/10/2017   Procedure: TRANSESOPHAGEAL ECHOCARDIOGRAM (TEE);  Surgeon: Lelon Perla, MD;  Location: [place];  Service: Cardiovascular;  Laterality: N/A;  . TONSILLECTOMY  1982  . TRANSURETHRAL RESECTION OF PROSTATE N/A 07/26/2017   Procedure: TRANSURETHRAL RESECTION OF THE PROSTATE (TURP) WITH UPROOFING OF PROSTATE ABSCESS;  Surgeon: Irine Seal, MD;  Location: [facility];  Service: Urology;  Laterality: N/A;   Family History  Problem Relation Age of Onset  . Hyperlipidemia Mother   . Diabetes Mother   . Hyperlipidemia Father   . Diabetes Father    Social History   Tobacco Use  . Smoking status: Current Every Day Smoker    Packs/day: 0.50    Years:  32.00    Pack years: 16.00    Types: Cigarettes  . Smokeless tobacco: Never Used  Substance Use Topics  . Alcohol use: Yes    Alcohol/week: 6.0 standard drinks    Types: 6 Cans of beer per week    Comment: "drink qd"-beer-3 cans  . Drug use: Yes    Types: Marijuana    Comment: pt reports last use several months ago.    Current  Outpatient Medications  Medication Sig Dispense Refill  . atorvastatin (LIPITOR) 20 MG tablet Take 20 mg by mouth daily.    Marland Kitchen glipiZIDE (GLUCOTROL) 10 MG tablet Take 1 tablet (10 mg total) by mouth 2 (two) times daily before a meal. 60 tablet 3  . lisinopril (PRINIVIL,ZESTRIL) 10 MG tablet Take 10 mg by mouth daily.    . metFORMIN (GLUCOPHAGE) 1000 MG tablet Take 1 tablet (1,000 mg total) by mouth 2 (two) times daily. COMPLETE PHYSICAL EXAM WITH LABS REQUIRED FOR ADDITIONAL REFILLS (Patient taking differently: Take 1,000 mg by mouth 2 (two) times daily. ) 60 tablet 2  . oxyCODONE (OXY IR/ROXICODONE) 5 MG immediate release tablet Take 1 tablet (5 mg total) by mouth every 8 (eight) hours as needed for moderate pain or severe pain. 5 tablet 0  . pantoprazole (PROTONIX) 40 MG tablet Take 1 tablet (40 mg total) by mouth daily. 30 tablet 0  . polyethylene glycol (MIRALAX / GLYCOLAX) packet Take 17 g by mouth daily as needed for mild constipation. 14 each 0   No current facility-administered medications for this visit.    No Known Allergies   Review of Systems: All systems reviewed and negative except where noted in HPI.    Mr Abdomen W Wo Contrast  Result Date: 12/21/2018 CLINICAL DATA:  Inpatient.  Cirrhosis.  Follow-up liver lesion. EXAM: MRI ABDOMEN WITHOUT AND WITH CONTRAST TECHNIQUE: Multiplanar multisequence MR imaging of the abdomen was performed both before and after the administration of intravenous contrast. CONTRAST:  6 cc Gadavist IV. COMPARISON:  08/11/2017 CT abdomen/pelvis. FINDINGS: Lower chest: No acute abnormality at the lung bases. Small lower esophageal varices. Hepatobiliary: Liver surface is diffusely irregular with relative hypertrophy of the lateral segment left liver lobe, compatible with cirrhosis. No hepatic steatosis. There is a 0.6 cm segment 6 right liver lobe lesion (series 11/image 16) with mild T2 hyperintensity and no enhancement, stable in size since 08/11/2017 CT,  compatible with a benign proteinaceous cyst. Several nonspecific tiny indistinct subcentimeter foci of vague arterial hyperenhancement at the periphery of the liver, largest 0.7 cm in peripheral right liver lobe (series 17/image 21), which are occult on all other sequences. No overtly suspicious liver masses. Nondistended gallbladder with layering subcentimeter gallstones. Prominent diffuse gallbladder wall thickening. No biliary ductal dilatation. Common bile duct diameter 2 mm. No evidence of choledocholithiasis. Pancreas: No pancreatic mass or duct dilation.  No pancreas divisum. Spleen: Normal size. No mass. Adrenals/Urinary Tract: Normal adrenals. No hydronephrosis. Small simple bilateral renal cysts, largest 1.0 cm in the posterior interpolar left kidney. No suspicious renal masses. Stomach/Bowel: Normal non-distended stomach. Visualized small and large bowel is normal caliber, with no bowel wall thickening. Vascular/Lymphatic: Atherosclerotic nonaneurysmal abdominal aorta. Patent portal, splenic, hepatic and renal veins. Mild gastrohepatic ligament, porta hepatis and portacaval adenopathy is stable since 08/11/2017 CT, most compatible with benign reactive adenopathy, for example a 1.5 cm portacaval node (series 5/image 19). No new pathologically enlarged lymph nodes in the abdomen. Other: Trace perihepatic ascites.  No focal fluid collection. Musculoskeletal: No aggressive appearing  focal osseous lesions. IMPRESSION: 1. Cirrhosis. No overtly suspicious liver masses. Several subcentimeter scattered indistinct foci of arterial hyperenhancement in the liver, potentially benign transient hepatic intensity differences, for which follow-up MRI abdomen without and with IV contrast is recommended in 6 months. 2. Trace perihepatic ascites. Small esophageal varices. Normal size spleen. 3. Chronic mild reactive upper retroperitoneal adenopathy. 4. Cholelithiasis. Nonspecific prominent diffuse gallbladder wall  thickening, probably due to noninflammatory edema given the absence of gallbladder distension. 5.  Aortic Atherosclerosis (ICD10-I70.0). Electronically Signed   By: Ilona Sorrel M.D.   On: 12/21/2018 21:39   Lab Results  Component Value Date   WBC 4.7 12/24/2018   HGB 12.2 (L) 12/24/2018   HCT 36.2 (L) 12/24/2018   MCV 91.9 12/24/2018   PLT 93 (L) 12/24/2018    Lab Results  Component Value Date   CREATININE 0.86 12/22/2018   BUN 6 12/22/2018   NA 136 12/22/2018   K 3.4 (L) 12/22/2018   CL 99 12/22/2018   CO2 25 12/22/2018    Lab Results  Component Value Date   ALT 63 (H) 12/22/2018   AST 108 (H) 12/22/2018   ALKPHOS 97 12/22/2018   BILITOT 2.0 (H) 12/22/2018    Lab Results  Component Value Date   INR 1.32 12/21/2018   INR 0.98 08/20/2018   INR 1.32 08/05/2017     Physical Exam: BP 118/70 (BP Location: Left Arm, Patient Position: Sitting, Cuff Size: Normal)   Pulse 64   Ht 5' 8.5" (1.74 m) Comment: height measured without shoes  Wt 156 lb 8 oz (71 kg)   BMI 23.45 kg/m  Constitutional: Pleasant, male in no acute distress, walking with a cane HEENT: Normocephalic and atraumatic. Conjunctivae are normal. No scleral icterus. Neck supple.  Cardiovascular: Normal rate, regular rhythm.  Pulmonary/chest: Effort normal and breath sounds normal. No wheezing, rales or rhonchi. Abdominal: Soft, nondistended, nontender.  There are no masses palpable. No hepatomegaly. Extremities: no edema Lymphadenopathy: No cervical adenopathy noted. Neurological: Alert and oriented to person place and time. Skin: Skin is warm and dry. No rashes noted. Psychiatric: Normal mood and affect. Behavior is normal.   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 He was treated with banding and octreotide and discharged in stable condition. He  should've been on propranolol time of discharge and unclear why this was not done. His resting heart rate is in the low 60s but start him on a low-dose propranolol today and see how tolerates it. I discussed cirrhosis with him in general, considered to be decompensated following his bleeding episode. Otherwise no encephalopathy, ascites, jaundice. I suspect his cirrhosis is due to hepatitis C although he has a serologic workup pending for hemochromatosis genetic testing given elevated iron tests and will check smooth muscle antibody to ensure normal given his elevated immunoglobulin level. He does drink socially and recommend he completely abstain at this time. Complete recommendations as outlined below. Overall doing better since discharge.  Recommend: - repeat EGD with additional banding of varices if needed. I discussed risks and benefits and wants to proceed. This is scheduled to be done at the hospital next week. - start propranolol 10 mg twice daily. If he tolerates this will titrate up to 20 mg twice daily, will need to keep an eye on his resting heart rate - we'll check AFP level, hemachromatosis genetic testing,  smooth muscle antibody - we'll refer to infectious disease for hepatitis C therapy which will hopefully minimize progression of his liver disease - continue protonix for now - repeat MRI of the liver in August 2020 - will need screening colonoscopy once recovered from his acute issues  He will see me every 6 months otherwise for follow up of his cirrhosis. He verbalized understanding and agreed.  [place] Cellar, MD [facility] Gastroenterology

## 2019-01-13 NOTE — Progress Notes (Signed)
HPI :  58 y/o male here for a follow up visit.  I met him when I took care of him at [facility] in February. He presented with hematemesis and a mild anemia. He has a history of untreated hep C and cirrhosis which he was not aware of at the time. EGD showed large esophageal varices, one of which had a red marking, treated with banding x 5. Suspect he had a mild variceal bleed. He was admitted for a few days on IV octreotide and protonix. He had an MRI done during his inpatient stay as well as serologic workup for other liver diseases. MRI as outlined below, radiology recommending 6 month follow up exam. No obvious evidence of malignancy. 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 Ceruloplasmin negative. He recovered well and was supposed to be placed on propranolol at discharge but was not, not sure why.   He states in general he's been feeling much better since he's been discharged. No further bleeding symptoms. His bowels are okay without any blood. He does have some occasional dysphagia since the banding was performed. He denies any heartburn or reflux symptoms. He does admit to some social alcohol use, often over the weekend, he will have upwards of 4 beers per day. He states he does not drink during the week. He denies any significant alcohol use historically.  EGD 12/21/2018 - large esophageal varices, one of which with red marking, 5 bands placed, portal hypertensive gastritis   MRI abdomen 12/21/18 - no liver masses but several subcm indistinct foci of arterial hyperenhancement in the liver, potentially benign transient hepatic intensity differences, for which follow-up MRI abdomen without and with IV contrast is recommended in 6 months. 2. Trace perihepatic ascites. Small esophageal varices. Normal size spleen. 3. Chronic mild reactive upper retroperitoneal  adenopathy. 4. Cholelithiasis. Nonspecific prominent diffuse gallbladder wall thickening, probably due to noninflammatory edema given the absence of gallbladder distension. 5.  Aortic Atherosclerosis (ICD10-I70.0).   Past Medical History:  Diagnosis Date  . Cirrhosis ([place])   . Cirrhosis ([place])   . Depression   . HCV (hepatitis C virus)   . Heart murmur    "born w/one"  . Hypertension   . Type II diabetes mellitus ([place])      Past Surgical History:  Procedure Laterality Date  . ESOPHAGEAL BANDING  12/21/2018   Procedure: ESOPHAGEAL BANDING;  Surgeon: Yetta Flock, MD;  Location: [facility] ENDOSCOPY;  Service: Gastroenterology;;  . ESOPHAGOGASTRODUODENOSCOPY (EGD) WITH PROPOFOL N/A 12/21/2018   Procedure: ESOPHAGOGASTRODUODENOSCOPY (EGD) WITH PROPOFOL;  Surgeon: Yetta Flock, MD;  Location: [place];  Service: Gastroenterology;  Laterality: N/A;  . TEE WITHOUT CARDIOVERSION N/A 08/10/2017   Procedure: TRANSESOPHAGEAL ECHOCARDIOGRAM (TEE);  Surgeon: Lelon Perla, MD;  Location: [place];  Service: Cardiovascular;  Laterality: N/A;  . TONSILLECTOMY  1982  . TRANSURETHRAL RESECTION OF PROSTATE N/A 07/26/2017   Procedure: TRANSURETHRAL RESECTION OF THE PROSTATE (TURP) WITH UPROOFING OF PROSTATE ABSCESS;  Surgeon: Irine Seal, MD;  Location: [facility] ORS;  Service: Urology;  Laterality: N/A;   Family History  Problem Relation Age of Onset  . Hyperlipidemia Mother   . Diabetes Mother   . Hyperlipidemia Father   . Diabetes Father    Social History   Tobacco Use  . Smoking status: Current Every Day Smoker    Packs/day: 0.50    Years:  32.00    Pack years: 16.00    Types: Cigarettes  . Smokeless tobacco: Never Used  Substance Use Topics  . Alcohol use: Yes    Alcohol/week: 6.0 standard drinks    Types: 6 Cans of beer per week    Comment: "drink qd"-beer-3 cans  . Drug use: Yes    Types: Marijuana    Comment: pt reports last use several months ago.    Current  Outpatient Medications  Medication Sig Dispense Refill  . atorvastatin (LIPITOR) 20 MG tablet Take 20 mg by mouth daily.    Marland Kitchen glipiZIDE (GLUCOTROL) 10 MG tablet Take 1 tablet (10 mg total) by mouth 2 (two) times daily before a meal. 60 tablet 3  . lisinopril (PRINIVIL,ZESTRIL) 10 MG tablet Take 10 mg by mouth daily.    . metFORMIN (GLUCOPHAGE) 1000 MG tablet Take 1 tablet (1,000 mg total) by mouth 2 (two) times daily. COMPLETE PHYSICAL EXAM WITH LABS REQUIRED FOR ADDITIONAL REFILLS (Patient taking differently: Take 1,000 mg by mouth 2 (two) times daily. ) 60 tablet 2  . oxyCODONE (OXY IR/ROXICODONE) 5 MG immediate release tablet Take 1 tablet (5 mg total) by mouth every 8 (eight) hours as needed for moderate pain or severe pain. 5 tablet 0  . pantoprazole (PROTONIX) 40 MG tablet Take 1 tablet (40 mg total) by mouth daily. 30 tablet 0  . polyethylene glycol (MIRALAX / GLYCOLAX) packet Take 17 g by mouth daily as needed for mild constipation. 14 each 0   No current facility-administered medications for this visit.    No Known Allergies   Review of Systems: All systems reviewed and negative except where noted in HPI.    Mr Abdomen W Wo Contrast  Result Date: 12/21/2018 CLINICAL DATA:  Inpatient.  Cirrhosis.  Follow-up liver lesion. EXAM: MRI ABDOMEN WITHOUT AND WITH CONTRAST TECHNIQUE: Multiplanar multisequence MR imaging of the abdomen was performed both before and after the administration of intravenous contrast. CONTRAST:  6 cc Gadavist IV. COMPARISON:  08/11/2017 CT abdomen/pelvis. FINDINGS: Lower chest: No acute abnormality at the lung bases. Small lower esophageal varices. Hepatobiliary: Liver surface is diffusely irregular with relative hypertrophy of the lateral segment left liver lobe, compatible with cirrhosis. No hepatic steatosis. There is a 0.6 cm segment 6 right liver lobe lesion (series 11/image 16) with mild T2 hyperintensity and no enhancement, stable in size since 08/11/2017 CT,  compatible with a benign proteinaceous cyst. Several nonspecific tiny indistinct subcentimeter foci of vague arterial hyperenhancement at the periphery of the liver, largest 0.7 cm in peripheral right liver lobe (series 17/image 21), which are occult on all other sequences. No overtly suspicious liver masses. Nondistended gallbladder with layering subcentimeter gallstones. Prominent diffuse gallbladder wall thickening. No biliary ductal dilatation. Common bile duct diameter 2 mm. No evidence of choledocholithiasis. Pancreas: No pancreatic mass or duct dilation.  No pancreas divisum. Spleen: Normal size. No mass. Adrenals/Urinary Tract: Normal adrenals. No hydronephrosis. Small simple bilateral renal cysts, largest 1.0 cm in the posterior interpolar left kidney. No suspicious renal masses. Stomach/Bowel: Normal non-distended stomach. Visualized small and large bowel is normal caliber, with no bowel wall thickening. Vascular/Lymphatic: Atherosclerotic nonaneurysmal abdominal aorta. Patent portal, splenic, hepatic and renal veins. Mild gastrohepatic ligament, porta hepatis and portacaval adenopathy is stable since 08/11/2017 CT, most compatible with benign reactive adenopathy, for example a 1.5 cm portacaval node (series 5/image 19). No new pathologically enlarged lymph nodes in the abdomen. Other: Trace perihepatic ascites.  No focal fluid collection. Musculoskeletal: No aggressive appearing  focal osseous lesions. IMPRESSION: 1. Cirrhosis. No overtly suspicious liver masses. Several subcentimeter scattered indistinct foci of arterial hyperenhancement in the liver, potentially benign transient hepatic intensity differences, for which follow-up MRI abdomen without and with IV contrast is recommended in 6 months. 2. Trace perihepatic ascites. Small esophageal varices. Normal size spleen. 3. Chronic mild reactive upper retroperitoneal adenopathy. 4. Cholelithiasis. Nonspecific prominent diffuse gallbladder wall  thickening, probably due to noninflammatory edema given the absence of gallbladder distension. 5.  Aortic Atherosclerosis (ICD10-I70.0). Electronically Signed   By: Ilona Sorrel M.D.   On: 12/21/2018 21:39   Lab Results  Component Value Date   WBC 4.7 12/24/2018   HGB 12.2 (L) 12/24/2018   HCT 36.2 (L) 12/24/2018   MCV 91.9 12/24/2018   PLT 93 (L) 12/24/2018    Lab Results  Component Value Date   CREATININE 0.86 12/22/2018   BUN 6 12/22/2018   NA 136 12/22/2018   K 3.4 (L) 12/22/2018   CL 99 12/22/2018   CO2 25 12/22/2018    Lab Results  Component Value Date   ALT 63 (H) 12/22/2018   AST 108 (H) 12/22/2018   ALKPHOS 97 12/22/2018   BILITOT 2.0 (H) 12/22/2018    Lab Results  Component Value Date   INR 1.32 12/21/2018   INR 0.98 08/20/2018   INR 1.32 08/05/2017     Physical Exam: BP 118/70 (BP Location: Left Arm, Patient Position: Sitting, Cuff Size: Normal)   Pulse 64   Ht 5' 8.5" (1.74 m) Comment: height measured without shoes  Wt 156 lb 8 oz (71 kg)   BMI 23.45 kg/m  Constitutional: Pleasant, male in no acute distress, walking with a cane HEENT: Normocephalic and atraumatic. Conjunctivae are normal. No scleral icterus. Neck supple.  Cardiovascular: Normal rate, regular rhythm.  Pulmonary/chest: Effort normal and breath sounds normal. No wheezing, rales or rhonchi. Abdominal: Soft, nondistended, nontender.  There are no masses palpable. No hepatomegaly. Extremities: no edema Lymphadenopathy: No cervical adenopathy noted. Neurological: Alert and oriented to person place and time. Skin: Skin is warm and dry. No rashes noted. Psychiatric: Normal mood and affect. Behavior is normal.   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 He was treated with banding and octreotide and discharged in stable condition. He  should've been on propranolol time of discharge and unclear why this was not done. His resting heart rate is in the low 60s but start him on a low-dose propranolol today and see how tolerates it. I discussed cirrhosis with him in general, considered to be decompensated following his bleeding episode. Otherwise no encephalopathy, ascites, jaundice. I suspect his cirrhosis is due to hepatitis C although he has a serologic workup pending for hemochromatosis genetic testing given elevated iron tests and will check smooth muscle antibody to ensure normal given his elevated immunoglobulin level. He does drink socially and recommend he completely abstain at this time. Complete recommendations as outlined below. Overall doing better since discharge.  Recommend: - repeat EGD with additional banding of varices if needed. I discussed risks and benefits and wants to proceed. This is scheduled to be done at the hospital next week. - start propranolol 10 mg twice daily. If he tolerates this will titrate up to 20 mg twice daily, will need to keep an eye on his resting heart rate - we'll check AFP level, hemachromatosis genetic testing,  smooth muscle antibody - we'll refer to infectious disease for hepatitis C therapy which will hopefully minimize progression of his liver disease - continue protonix for now - repeat MRI of the liver in August 2020 - will need screening colonoscopy once recovered from his acute issues  He will see me every 6 months otherwise for follow up of his cirrhosis. He verbalized understanding and agreed.  [place] Cellar, MD [facility] Gastroenterology

## 2019-01-15 NOTE — Anesthesia Preprocedure Evaluation (Addendum)
Anesthesia Evaluation  Patient identified by MRN, date of birth, ID band Patient awake    Reviewed: Allergy & Precautions, NPO status , Patient's Chart, lab work & pertinent test results  Airway Mallampati: II  TM Distance: >3 FB Neck ROM: Full    Dental no notable dental hx. (+) Edentulous Upper, Edentulous Lower   Pulmonary Current Smoker,    Pulmonary exam normal breath sounds clear to auscultation       Cardiovascular hypertension, Pt. on medications Normal cardiovascular exam Rhythm:Regular Rate:Normal     Neuro/Psych Anxiety negative neurological ROS     GI/Hepatic GERD  ,(+) Cirrhosis       , C  Endo/Other  diabetes, Type 2  Renal/GU      Musculoskeletal   Abdominal   Peds  Hematology Hgb 12.2 Plt 96   Anesthesia Other Findings   Reproductive/Obstetrics                            Anesthesia Physical Anesthesia Plan  ASA: III  Anesthesia Plan: MAC   Post-op Pain Management:    Induction: Intravenous  PONV Risk Score and Plan: Treatment may vary due to age or medical condition  Airway Management Planned: Natural Airway and Nasal Cannula  Additional Equipment:   Intra-op Plan:   Post-operative Plan:   Informed Consent: I have reviewed the patients History and Physical, chart, labs and discussed the procedure including the risks, benefits and alternatives for the proposed anesthesia with the patient or authorized representative who has indicated his/her understanding and acceptance.     Dental advisory given  Plan Discussed with: CRNA  Anesthesia Plan Comments:         Anesthesia Quick Evaluation

## 2019-01-16 ENCOUNTER — Ambulatory Visit (HOSPITAL_COMMUNITY): Payer: Medicaid Other | Admitting: Anesthesiology

## 2019-01-16 ENCOUNTER — Encounter (HOSPITAL_COMMUNITY)
Admission: RE | Disposition: A | Discharge status: Home / Self Care | Payer: Self-pay | Source: Home / Self Care | Attending: Gastroenterology

## 2019-01-16 ENCOUNTER — Encounter (HOSPITAL_COMMUNITY): Payer: Self-pay

## 2019-01-16 ENCOUNTER — Ambulatory Visit (HOSPITAL_COMMUNITY)
Admission: RE | Admit: 2019-01-16 | Discharge: 2019-01-16 | Disposition: A | Discharge status: Home / Self Care | Payer: Medicaid Other | Attending: Gastroenterology | Admitting: Gastroenterology

## 2019-01-16 ENCOUNTER — Other Ambulatory Visit: Payer: Self-pay

## 2019-01-16 ENCOUNTER — Other Ambulatory Visit: Payer: Self-pay | Admitting: Gastroenterology

## 2019-01-16 ENCOUNTER — Encounter (HOSPITAL_COMMUNITY): Payer: Self-pay | Admitting: *Deleted

## 2019-01-16 ENCOUNTER — Ambulatory Visit (HOSPITAL_COMMUNITY): Admit: 2019-01-16 | Payer: Medicaid Other | Admitting: Gastroenterology

## 2019-01-16 ENCOUNTER — Telehealth: Payer: Self-pay | Admitting: Gastroenterology

## 2019-01-16 DIAGNOSIS — I10 Essential (primary) hypertension: Secondary | ICD-10-CM | POA: Diagnosis not present

## 2019-01-16 DIAGNOSIS — K259 Gastric ulcer, unspecified as acute or chronic, without hemorrhage or perforation: Secondary | ICD-10-CM | POA: Diagnosis not present

## 2019-01-16 DIAGNOSIS — Z79899 Other long term (current) drug therapy: Secondary | ICD-10-CM | POA: Insufficient documentation

## 2019-01-16 DIAGNOSIS — Z09 Encounter for follow-up examination after completed treatment for conditions other than malignant neoplasm: Secondary | ICD-10-CM | POA: Diagnosis present

## 2019-01-16 DIAGNOSIS — K2970 Gastritis, unspecified, without bleeding: Secondary | ICD-10-CM | POA: Insufficient documentation

## 2019-01-16 DIAGNOSIS — Z7984 Long term (current) use of oral hypoglycemic drugs: Secondary | ICD-10-CM | POA: Insufficient documentation

## 2019-01-16 DIAGNOSIS — B182 Chronic viral hepatitis C: Secondary | ICD-10-CM

## 2019-01-16 DIAGNOSIS — B1920 Unspecified viral hepatitis C without hepatic coma: Secondary | ICD-10-CM | POA: Diagnosis not present

## 2019-01-16 DIAGNOSIS — I8501 Esophageal varices with bleeding: Secondary | ICD-10-CM

## 2019-01-16 DIAGNOSIS — I8510 Secondary esophageal varices without bleeding: Secondary | ICD-10-CM | POA: Diagnosis not present

## 2019-01-16 DIAGNOSIS — F17210 Nicotine dependence, cigarettes, uncomplicated: Secondary | ICD-10-CM | POA: Insufficient documentation

## 2019-01-16 DIAGNOSIS — K7460 Unspecified cirrhosis of liver: Secondary | ICD-10-CM

## 2019-01-16 DIAGNOSIS — E119 Type 2 diabetes mellitus without complications: Secondary | ICD-10-CM | POA: Insufficient documentation

## 2019-01-16 DIAGNOSIS — K219 Gastro-esophageal reflux disease without esophagitis: Secondary | ICD-10-CM | POA: Diagnosis not present

## 2019-01-16 DIAGNOSIS — E1110 Type 2 diabetes mellitus with ketoacidosis without coma: Secondary | ICD-10-CM

## 2019-01-16 HISTORY — PX: ESOPHAGOGASTRODUODENOSCOPY (EGD) WITH PROPOFOL: SHX5813

## 2019-01-16 HISTORY — PX: BIOPSY: SHX5522

## 2019-01-16 HISTORY — PX: ESOPHAGEAL BANDING: SHX5518

## 2019-01-16 LAB — GLUCOSE, CAPILLARY: Glucose-Capillary: 93 mg/dL (ref 70–99)

## 2019-01-16 SURGERY — ESOPHAGOGASTRODUODENOSCOPY (EGD) WITH PROPOFOL
Anesthesia: Monitor Anesthesia Care

## 2019-01-16 MED ORDER — PANTOPRAZOLE SODIUM 40 MG PO TBEC: 40.0000 mg | DELAYED_RELEASE_TABLET | Freq: Two times a day (BID) | ORAL | 1 refills | Status: DC

## 2019-01-16 MED ORDER — SODIUM CHLORIDE 0.9 % IV SOLN: INTRAVENOUS | Status: DC

## 2019-01-16 MED ORDER — PROPOFOL 10 MG/ML IV BOLUS
INTRAVENOUS | Status: DC | PRN
  Administered 2019-01-16: 30 mg via INTRAVENOUS
  Administered 2019-01-16 (×2): 20 mg via INTRAVENOUS

## 2019-01-16 MED ORDER — PROPOFOL 10 MG/ML IV BOLUS
INTRAVENOUS | Status: AC
  Filled 2019-01-16: qty 40

## 2019-01-16 MED ORDER — METFORMIN HCL 1000 MG PO TABS: 1000.0000 mg | ORAL_TABLET | Freq: Two times a day (BID) | ORAL | 3 refills | Status: AC

## 2019-01-16 MED ORDER — LACTATED RINGERS IV SOLN
INTRAVENOUS | Status: DC
  Administered 2019-01-16: 1000 mL via INTRAVENOUS

## 2019-01-16 MED ORDER — OXYCODONE-ACETAMINOPHEN 5-325 MG PO TABS: 1.0000 | ORAL_TABLET | Freq: Four times a day (QID) | ORAL | 0 refills | Status: DC | PRN

## 2019-01-16 MED ORDER — PROPOFOL 500 MG/50ML IV EMUL
INTRAVENOUS | Status: DC | PRN
  Administered 2019-01-16: 150 ug/kg/min via INTRAVENOUS

## 2019-01-16 MED ORDER — OXYCODONE-ACETAMINOPHEN 5-325 MG PO TABS: 1.0000 | ORAL_TABLET | ORAL | 0 refills | Status: DC | PRN

## 2019-01-16 SURGICAL SUPPLY — 14 items

## 2019-01-16 NOTE — Transfer of Care (Signed)
Immediate Anesthesia Transfer of Care Note  Patient: Cameron Coffey  Procedure(s) Performed: ESOPHAGOGASTRODUODENOSCOPY (EGD) WITH PROPOFOL (N/A ) BIOPSY ESOPHAGEAL BANDING  Patient Location: PACU  Anesthesia Type:MAC  Level of Consciousness: sedated  Airway & Oxygen Therapy: Patient Spontanous Breathing and Patient connected to nasal cannula oxygen  Post-op Assessment: Report given to RN and Post -op Vital signs reviewed and stable  Post vital signs: Reviewed and stable  Last Vitals:  Vitals Value Taken Time  BP    Temp    Pulse    Resp    SpO2      Last Pain:  Vitals:   01/16/19 0649  TempSrc: Oral  PainSc: 0-No pain         Complications: No apparent anesthesia complications

## 2019-01-16 NOTE — Telephone Encounter (Signed)
Jan I put this through electronically and it looks like it was electronically received by the pharmacy. Can you help contact the pharmacy to see if it went through? I called the patient and left him a message that I sent it electronically.

## 2019-01-16 NOTE — Telephone Encounter (Signed)
Called pharmacy. They received script and patient has been informed.

## 2019-01-16 NOTE — Progress Notes (Signed)
amb ref and orders for admission EGD- banding esophageal varices at [facility] on 4-7 at 12:00pm to arrive and 10:30am

## 2019-01-16 NOTE — Discharge Instructions (Signed)
Upper Endoscopy, Adult °Upper endoscopy is a procedure to look inside the upper GI (gastrointestinal) tract. The upper GI tract is made up of: °· The part of the body that moves food from your mouth to your stomach (esophagus). °· The stomach. °· The first part of your small intestine (duodenum). °This procedure is also called esophagogastroduodenoscopy (EGD) or gastroscopy. In this procedure, your health care provider passes a thin, flexible tube (endoscope) through your mouth and down your esophagus into your stomach. A small camera is attached to the end of the tube. Images from the camera appear on a monitor in the exam room. During this procedure, your health care provider may also remove a small piece of tissue to be sent to a lab and examined under a microscope (biopsy). °Your health care provider may do an upper endoscopy to diagnose cancers of the upper GI tract. You may also have this procedure to find the cause of other conditions, such as: °· Stomach pain. °· Heartburn. °· Pain or problems when swallowing. °· Nausea and vomiting. °· Stomach bleeding. °· Stomach ulcers. °Tell a health care provider about: °· Any allergies you have. °· All medicines you are taking, including vitamins, herbs, eye drops, creams, and over-the-counter medicines. °· Any problems you or family members have had with anesthetic medicines. °· Any blood disorders you have. °· Any surgeries you have had. °· Any medical conditions you have. °· Whether you are pregnant or may be pregnant. °What are the risks? °Generally, this is a safe procedure. However, problems may occur, including: °· Infection. °· Bleeding. °· Allergic reactions to medicines. °· A tear or hole (perforation) in the esophagus, stomach, or duodenum. °What happens before the procedure? °Staying hydrated °Follow instructions from your health care provider about hydration, which may include: °· Up to 2 hours before the procedure - you may continue to drink clear  liquids, such as water, clear fruit juice, black coffee, and plain tea. ° °Eating and drinking restrictions °Follow instructions from your health care provider about eating and drinking, which may include: °· 8 hours before the procedure - stop eating heavy meals or foods, such as meat, fried foods, or fatty foods. °· 6 hours before the procedure - stop eating light meals or foods, such as toast or cereal. °· 6 hours before the procedure - stop drinking milk or drinks that contain milk. °· 2 hours before the procedure - stop drinking clear liquids. °Medicines °Ask your health care provider about: °· Changing or stopping your regular medicines. This is especially important if you are taking diabetes medicines or blood thinners. °· Taking medicines such as aspirin and ibuprofen. These medicines can thin your blood. Do not take these medicines unless your health care provider tells you to take them. °· Taking over-the-counter medicines, vitamins, herbs, and supplements. °General instructions °· Plan to have someone take you home from the hospital or clinic. °· If you will be going home right after the procedure, plan to have someone with you for 24 hours. °· Ask your health care provider what steps will be taken to help prevent infection. °What happens during the procedure? ° °· An IV will be inserted into one of your veins. °· You may be given one or more of the following: °? A medicine to help you relax (sedative). °? A medicine to numb the throat (local anesthetic). °· You will lie on your left side on an exam table. °· Your health care provider will pass the endoscope through   your mouth and down your esophagus. °· Your health care provider will use the scope to check the inside of your esophagus, stomach, and duodenum. Biopsies may be taken. °· The endoscope will be removed. °The procedure may vary among health care providers and hospitals. °What happens after the procedure? °· Your blood pressure, heart rate,  breathing rate, and blood oxygen level will be monitored until you leave the hospital or clinic. °· Do not drive for 24 hours if you were given a sedative during your procedure. °· When your throat is no longer numb, you may be given some fluids to drink. °· It is up to you to get the results of your procedure. Ask your health care provider, or the department that is doing the procedure, when your results will be ready. °Summary °· Upper endoscopy is a procedure to look inside the upper GI tract. °· During the procedure, an IV will be inserted into one of your veins. You may be given a medicine to help you relax. °· A medicine will be used to numb your throat. °· The endoscope will be passed through your mouth and down your esophagus. °This information is not intended to replace advice given to you by your health care provider. Make sure you discuss any questions you have with your health care provider. °Document Released: 10/23/2000 Document Revised: 03/28/2018 Document Reviewed: 03/28/2018 °Elsevier Interactive Patient Education © 2019 [employer] ° °

## 2019-01-16 NOTE — Op Note (Signed)
[facility] Patient Name: Cameron Coffey Procedure Date: 01/16/2019 MRN: [id number] Attending MD: Carlota Raspberry. Havery Moros , MD Date of Birth: 10/06/1961 CSN: [id number] Age: 58 Admit Type: Outpatient Procedure:                Upper GI endoscopy Indications:              For therapy of esophageal varices, history of                            variceal bleed s/p 5 bands placed in February,                            history of hep C, here for surveillance endoscopy. Providers:                Carlota Raspberry. Havery Moros, MD, Jeanella Cara,                            RN, William Dalton, Technician Referring MD:              Medicines:                Monitored Anesthesia Care Complications:            No immediate complications. Estimated blood loss:                            Minimal. Estimated Blood Loss:     Estimated blood loss was minimal. Procedure:                Pre-Anesthesia Assessment:                           - Prior to the procedure, a History and Physical                            was performed, and patient medications and                            allergies were reviewed. The patient's tolerance of                            previous anesthesia was also reviewed. The risks                            and benefits of the procedure and the sedation                            options and risks were discussed with the patient.                            All questions were answered, and informed consent                            was obtained. Prior Anticoagulants: The patient has  taken no previous anticoagulant or antiplatelet                            agents. ASA Grade Assessment: III - A patient with                            severe systemic disease. After reviewing the risks                            and benefits, the patient was deemed in                            satisfactory condition to undergo the procedure.          After obtaining informed consent, the endoscope was                            passed under direct vision. Throughout the                            procedure, the patient's blood pressure, pulse, and                            oxygen saturations were monitored continuously. The                            [id number]-H190 ([id number] Olympus gastroscope was                            introduced through the mouth, and advanced to the                            second part of duodenum. The upper GI endoscopy was                            accomplished without difficulty. The patient                            tolerated the procedure well. Scope In: Scope Out: Findings:      Esophagogastric landmarks were identified: the Z-line was found at 40       cm, the gastroesophageal junction was found at 40 cm and the upper       extent of the gastric folds was found at 40 cm from the incisors.      Grade II varices were found in the lower third of the esophagus. They       were medium in size. Scarring from prior banding was noted and overal       varices appeared improved compared to to the previous exam. Two bands       were successfully placed on varices in the lower esophagus, resulting in       deflation of varices.      The exam of the esophagus was otherwise normal.      Diffuse mild inflammation characterized by erythema was found in the       entire examined stomach.  One non-bleeding superficial gastric ulcer with no stigmata of bleeding       was found at the pylorus. The lesion was roughly 3-5 mm in largest       dimension.      Biopsies were taken with a cold forceps in the gastric body, at the       incisura and in the gastric antrum for Helicobacter pylori testing.      The exam of the stomach was otherwise normal.      The duodenal bulb and second portion of the duodenum were normal. Impression:               - Esophagogastric landmarks identified.                           -  Grade II esophageal varices, improved from last                            exam. Banded x 2.                           - Gastritis.                           - Non-bleeding gastric ulcer with no stigmata of                            bleeding.                           - Normal duodenal bulb and second portion of the                            duodenum.                           - Biopsies were taken with a cold forceps for                            Helicobacter pylori testing. Moderate Sedation:      No moderate sedation, case performed with MAC Recommendation:           - Patient has a contact number available for                            emergencies. The signs and symptoms of potential                            delayed complications were discussed with the                            patient. Return to normal activities tomorrow.                            Written discharge instructions were provided to the                            patient.                           -  Liquid diet today, advance as tolerated.                           - Continue present medications including propranolol                           - Increase protonix to twice daily. No NSAIDs                           - Repeat EGD in 1 month                           - Await pathology results. Procedure Code(s):        --- Professional ---                           [phone], Esophagogastroduodenoscopy, flexible,                            transoral; with band ligation of esophageal/gastric                            varices                           43239, Esophagogastroduodenoscopy, flexible,                            transoral; with biopsy, single or multiple Diagnosis Code(s):        --- Professional ---                           I85.00, Esophageal varices without bleeding                           K29.70, Gastritis, unspecified, without bleeding                           K25.9, Gastric ulcer, unspecified as  acute or                            chronic, without hemorrhage or perforation CPT copyright 2018 American Medical Association. All rights reserved. The codes documented in this report are preliminary and upon coder review may  be revised to meet current compliance requirements. Remo Lipps P. Robet Crutchfield, MD 01/16/2019 8:17:43 AM This report has been signed electronically. Number of Addenda: 0

## 2019-01-16 NOTE — Anesthesia Postprocedure Evaluation (Signed)
Anesthesia Post Note  Patient: Cameron Coffey  Procedure(s) Performed: ESOPHAGOGASTRODUODENOSCOPY (EGD) WITH PROPOFOL (N/A ) BIOPSY ESOPHAGEAL BANDING     Patient location during evaluation: Endoscopy Anesthesia Type: MAC Level of consciousness: awake and alert Pain management: pain level controlled Vital Signs Assessment: post-procedure vital signs reviewed and stable Respiratory status: spontaneous breathing, nonlabored ventilation, respiratory function stable and patient connected to nasal cannula oxygen Cardiovascular status: blood pressure returned to baseline and stable Postop Assessment: no apparent nausea or vomiting Anesthetic complications: no    Last Vitals:  Vitals:   01/16/19 0815 01/16/19 0827  BP: (!) 170/97 (!) 183/106  Pulse: 61 (!) 57  Resp: (!) 23 17  Temp: 36.9 C   SpO2: 100% 100%    Last Pain:  Vitals:   01/16/19 0815  TempSrc: Axillary  PainSc: [place]

## 2019-01-16 NOTE — Interval H&P Note (Signed)
History and Physical Interval Note:  01/16/2019 7:27 AM  Cameron Coffey  has presented today for surgery, with the diagnosis of hematemesis/esophageal varices/cirrhosis.  The various methods of treatment have been discussed with the patient and family. After consideration of risks, benefits and other options for treatment, the patient has consented to  Procedure(s): ESOPHAGOGASTRODUODENOSCOPY (EGD) WITH PROPOFOL (N/A) as a surgical intervention.  The patient's history has been reviewed, patient examined, no change in status, stable for surgery.  I have reviewed the patient's chart and labs.  Questions were answered to the patient's satisfaction.     Viviann Spare P Armbruster

## 2019-01-17 ENCOUNTER — Encounter (HOSPITAL_COMMUNITY): Payer: Self-pay | Admitting: Gastroenterology

## 2019-01-18 ENCOUNTER — Encounter: Payer: Self-pay | Admitting: Gastroenterology

## 2019-01-19 ENCOUNTER — Encounter: Payer: Medicaid Other | Admitting: Infectious Diseases

## 2019-01-19 LAB — ANTI-SMOOTH MUSCLE ANTIBODY, IGG: Actin (Smooth Muscle) Antibody (IGG): <20 U (ref <20)

## 2019-01-19 LAB — HEMOCHROMATOSIS DNA-PCR(C282Y,H63D)

## 2019-01-19 LAB — AFP TUMOR MARKER: AFP-Tumor Marker: 376.8 ng/mL — ABNORMAL HIGH (ref <6.1)

## 2019-02-08 ENCOUNTER — Telehealth: Payer: Self-pay

## 2019-02-08 NOTE — Telephone Encounter (Signed)
Left message for pt. To call back

## 2019-02-08 NOTE — Telephone Encounter (Signed)
Spoke with pt's sister ,( she is involved with pt's care) told her we would need to cancel pt's EGD for 02/14/19 because of COVID-19. We will reschedule when able after restrictions are lifted

## 2019-02-14 ENCOUNTER — Ambulatory Visit (HOSPITAL_COMMUNITY): Admission: RE | Admit: 2019-02-14 | Payer: Medicaid Other | Source: Home / Self Care | Admitting: Gastroenterology

## 2019-02-14 ENCOUNTER — Encounter (HOSPITAL_COMMUNITY): Admission: RE | Payer: Self-pay | Source: Home / Self Care

## 2019-02-14 SURGERY — ESOPHAGOGASTRODUODENOSCOPY (EGD) WITH PROPOFOL
Anesthesia: Monitor Anesthesia Care

## 2019-02-27 ENCOUNTER — Encounter: Payer: Medicaid Other | Admitting: Family

## 2019-03-09 ENCOUNTER — Other Ambulatory Visit: Payer: Self-pay

## 2019-03-09 ENCOUNTER — Telehealth: Payer: Self-pay

## 2019-03-09 DIAGNOSIS — I8501 Esophageal varices with bleeding: Secondary | ICD-10-CM

## 2019-03-09 NOTE — Telephone Encounter (Signed)
Patient rescheduled because of COVID-19. EGD 03/27/19 @10 :00am at [facility].patient called with appt, and instructions. Also mailed

## 2019-03-14 ENCOUNTER — Telehealth: Payer: Self-pay

## 2019-03-14 NOTE — Telephone Encounter (Signed)
error 

## 2019-03-14 NOTE — Telephone Encounter (Signed)
-----   Message from Cooper Render, CMA sent at 01/25/2019 11:29 AM EDT ----- Regarding: MRI of liver in mid-May See result note from 3-18.  Pt needs an MRI of the Liver in mid-May

## 2019-03-21 ENCOUNTER — Telehealth: Payer: Self-pay

## 2019-03-21 NOTE — Telephone Encounter (Signed)
Called patient again, spoke to mother. Stated she will try to get a hold of patient and have him call us. (ref. To pre-procedure quarantine)

## 2019-03-22 ENCOUNTER — Telehealth: Payer: Self-pay

## 2019-03-22 NOTE — Telephone Encounter (Signed)
Tried multiple times to reach patient (refer. COVID-19 test 03/23/19 for patient's EGD on 03/27/19) and even wife's phone # (release of information person) with no return call. Patient does not have My-Chart

## 2019-03-22 NOTE — Progress Notes (Signed)
Called number listed in chart. Pt not available. Pt's mother stated she had been trying to get in touch with him and unable. Made mother aware for patient to call Dr. Venida Jarvis office regarding appointment.

## 2019-03-23 ENCOUNTER — Other Ambulatory Visit (HOSPITAL_COMMUNITY): Admission: RE | Admit: 2019-03-23 | Payer: Medicaid Other | Source: Ambulatory Visit

## 2019-03-23 ENCOUNTER — Telehealth: Payer: Self-pay

## 2019-03-23 NOTE — Telephone Encounter (Signed)
Called patient and left message on patient's voice mail, that we were having to cancel his EGD scheduled on 03/27/19 because we have not received a call back from him to schedule his pre COVID-19 testing, even with multiple messages left to call us.

## 2019-03-27 ENCOUNTER — Encounter (HOSPITAL_COMMUNITY): Admission: RE | Payer: Self-pay | Source: Home / Self Care

## 2019-03-27 ENCOUNTER — Ambulatory Visit (HOSPITAL_COMMUNITY): Admission: RE | Admit: 2019-03-27 | Payer: Medicaid Other | Source: Home / Self Care | Admitting: Gastroenterology

## 2019-03-27 SURGERY — ESOPHAGOGASTRODUODENOSCOPY (EGD) WITH PROPOFOL
Anesthesia: Monitor Anesthesia Care

## 2019-06-09 ENCOUNTER — Telehealth: Payer: Self-pay

## 2019-06-09 DIAGNOSIS — K7460 Unspecified cirrhosis of liver: Secondary | ICD-10-CM

## 2019-06-09 DIAGNOSIS — B182 Chronic viral hepatitis C: Secondary | ICD-10-CM

## 2019-06-09 NOTE — Telephone Encounter (Signed)
  MRI scheduled.  Letter sent to pt.

## 2019-06-09 NOTE — Telephone Encounter (Signed)
-----   Message from Roetta Sessions, [place] sent at 01/13/2019  4:57 PM EST ----- Regarding: MRI of  liver due in August 2020 Pt due for MRI of the Liver in august 2020 for cirrhosis with out ascites/Hep C

## 2019-06-13 ENCOUNTER — Telehealth: Payer: Self-pay

## 2019-06-13 NOTE — Telephone Encounter (Signed)
Have tried multiple times to reach patient (to schedule an EGD at the hospital)  by phone. He has no My Chart. Mother answers the phone and took our phone# to request a call back from the patient. She states he has no other phone # and she just "sees him when she sees him".

## 2019-06-21 ENCOUNTER — Ambulatory Visit (HOSPITAL_COMMUNITY): Admission: RE | Admit: 2019-06-21 | Payer: Medicaid Other | Source: Ambulatory Visit

## 2019-06-23 ENCOUNTER — Other Ambulatory Visit: Payer: Self-pay

## 2019-06-23 ENCOUNTER — Encounter (HOSPITAL_COMMUNITY): Payer: Self-pay | Admitting: Emergency Medicine

## 2019-06-23 ENCOUNTER — Emergency Department (HOSPITAL_COMMUNITY)
Admission: EM | Admit: 2019-06-23 | Discharge: 2019-06-23 | Disposition: A | Discharge status: Home / Self Care | Payer: Medicaid Other | Attending: Emergency Medicine | Admitting: Emergency Medicine

## 2019-06-23 ENCOUNTER — Emergency Department (HOSPITAL_COMMUNITY): Payer: Medicaid Other

## 2019-06-23 DIAGNOSIS — Z79899 Other long term (current) drug therapy: Secondary | ICD-10-CM | POA: Insufficient documentation

## 2019-06-23 DIAGNOSIS — F17210 Nicotine dependence, cigarettes, uncomplicated: Secondary | ICD-10-CM | POA: Diagnosis not present

## 2019-06-23 DIAGNOSIS — I10 Essential (primary) hypertension: Secondary | ICD-10-CM | POA: Insufficient documentation

## 2019-06-23 DIAGNOSIS — N451 Epididymitis: Secondary | ICD-10-CM | POA: Diagnosis not present

## 2019-06-23 DIAGNOSIS — N50811 Right testicular pain: Secondary | ICD-10-CM | POA: Diagnosis present

## 2019-06-23 DIAGNOSIS — E119 Type 2 diabetes mellitus without complications: Secondary | ICD-10-CM | POA: Insufficient documentation

## 2019-06-23 DIAGNOSIS — Z7984 Long term (current) use of oral hypoglycemic drugs: Secondary | ICD-10-CM | POA: Diagnosis not present

## 2019-06-23 LAB — CBC WITH DIFFERENTIAL/PLATELET
Abs Immature Granulocytes: 0.02 10*3/uL (ref 0.00–0.07)
Basophils Absolute: 0 10*3/uL (ref 0.0–0.1)
Basophils Relative: 0 %
Eosinophils Absolute: 0.1 10*3/uL (ref 0.0–0.5)
Eosinophils Relative: 1 %
HCT: 43.2 % (ref 39.0–52.0)
Hemoglobin: 14.5 g/dL (ref 13.0–17.0)
Immature Granulocytes: 0 %
Lymphocytes Relative: 39 %
Lymphs Abs: 2.8 10*3/uL (ref 0.7–4.0)
MCH: 32 pg (ref 26.0–34.0)
MCHC: 33.6 g/dL (ref 30.0–36.0)
MCV: 95.4 fL (ref 80.0–100.0)
Monocytes Absolute: 1.2 10*3/uL — ABNORMAL HIGH (ref 0.1–1.0)
Monocytes Relative: 17 %
Neutro Abs: 3 10*3/uL (ref 1.7–7.7)
Neutrophils Relative %: 43 %
Platelets: 159 10*3/uL (ref 150–400)
RBC: 4.53 MIL/uL (ref 4.22–5.81)
RDW: 15.6 % — ABNORMAL HIGH (ref 11.5–15.5)
WBC: 7.1 10*3/uL (ref 4.0–10.5)
nRBC: 0 % (ref 0.0–0.2)

## 2019-06-23 LAB — BASIC METABOLIC PANEL
Anion gap: 9 (ref 5–15)
BUN: 13 mg/dL (ref 6–20)
CO2: 24 mmol/L (ref 22–32)
Calcium: 8.3 mg/dL — ABNORMAL LOW (ref 8.9–10.3)
Chloride: 99 mmol/L (ref 98–111)
Creatinine, Ser: 0.96 mg/dL (ref 0.61–1.24)
GFR calc Af Amer: >60 mL/min (ref >60)
GFR calc non Af Amer: >60 mL/min (ref >60)
Glucose, Bld: 118 mg/dL — ABNORMAL HIGH (ref 70–99)
Potassium: 4.2 mmol/L (ref 3.5–5.1)
Sodium: 132 mmol/L — ABNORMAL LOW (ref 135–145)

## 2019-06-23 MED ORDER — ONDANSETRON HCL 4 MG/2ML IJ SOLN
4.0000 mg | Freq: Once | INTRAMUSCULAR | Status: AC
  Administered 2019-06-23: 4 mg via INTRAVENOUS
  Filled 2019-06-23: qty 2

## 2019-06-23 MED ORDER — MORPHINE SULFATE (PF) 4 MG/ML IV SOLN
4.0000 mg | Freq: Once | INTRAVENOUS | Status: AC
  Administered 2019-06-23: 4 mg via INTRAVENOUS
  Filled 2019-06-23: qty 1

## 2019-06-23 MED ORDER — LEVOFLOXACIN 750 MG PO TABS: 750.0000 mg | ORAL_TABLET | Freq: Every day | ORAL | 0 refills | Status: DC

## 2019-06-23 MED ORDER — OXYCODONE HCL 5 MG PO TABS: 5.0000 mg | ORAL_TABLET | ORAL | 0 refills | Status: DC | PRN

## 2019-06-23 NOTE — ED Provider Notes (Addendum)
[facility]-EMERGENCY DEPT Provider Note   CSN: [id number] Arrival date & time: 06/23/19  1012     History   Chief Complaint Chief Complaint  Patient presents with  . Testicle Pain    HPI Cameron Coffey is a 58 y.o. male.     Patient is a 58 year old male with a history of cirrhosis, hepatitis C, hypertension and diabetes who is presenting today with severe testicular pain.  Patient states 3 days ago he was working in the garden shoveling, planting and carrying mulch.  He said the next day that he woke up with pain and swelling in his testicle.  It is only worsened over the last 2 days.  Now he is unable to walk because the pain is so severe.  He rates it as sharp, stabbing, throbbing and a 9 out of 10.  The history is provided by the patient.  Testicle Pain This is a new problem. The current episode started 2 days ago. The problem occurs constantly. The problem has been gradually worsening. Pertinent negatives include no abdominal pain. Associated symptoms comments: No dysuria, frequency/urgency or discharge.  No change in bowel movements.. The symptoms are aggravated by walking and standing. Nothing relieves the symptoms. He has tried rest for the symptoms. The treatment provided no relief.    Past Medical History:  Diagnosis Date  . Cirrhosis (HCC)   . Cirrhosis (HCC)   . Depression   . HCV (hepatitis C virus)   . Heart murmur    "born w/one"  . Hypertension   . Type II diabetes mellitus [facility](HCC)     Patient Active Problem List   Diagnosis Date Noted  . Upper GI bleed 12/21/2018  . Cirrhosis of liver 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)   . Chronic hepatitis C without hepatic coma (HCC)   . Bleeding esophageal varices (HCC)   . Depression with anxiety 09/15/2017  . Dyslipidemia associated with type 2 diabetes mellitus (HCC) 07/30/2017  . Type II diabetes with long term use of insulin (HCC) 07/30/2017  . Essential hypertension 05/28/2015    Past Surgical  History:  Procedure Laterality Date  . BIOPSY  01/16/2019   Procedure: BIOPSY;  Surgeon: Benancio DeedsArmbruster, Steven P, MD;  [facility];  Service: Gastroenterology;;  . ESOPHAGEAL BANDING  12/21/2018   Procedure: ESOPHAGEAL BANDING;  Surgeon: Benancio DeedsArmbruster, Steven P, MD;  [facility];  Service: Gastroenterology;;  . ESOPHAGEAL BANDING  01/16/2019   Procedure: ESOPHAGEAL BANDING;  Surgeon: Benancio DeedsArmbruster, Steven P, MD;  Location: [facility];  Service: Gastroenterology;;  . ESOPHAGOGASTRODUODENOSCOPY (EGD) WITH PROPOFOL N/A 12/21/2018   Procedure: ESOPHAGOGASTRODUODENOSCOPY (EGD) WITH PROPOFOL;  Surgeon: Benancio DeedsArmbruster, Steven P, MD;  [facility];  Service: Gastroenterology;  Laterality: N/A;  . ESOPHAGOGASTRODUODENOSCOPY (EGD) WITH PROPOFOL N/A 01/16/2019   Procedure: ESOPHAGOGASTRODUODENOSCOPY (EGD) WITH PROPOFOL;  Surgeon: Benancio DeedsArmbruster, Steven P, MD;  Location: [facility];  Service: Gastroenterology;  Laterality: N/A;  . TEE WITHOUT CARDIOVERSION N/A 08/10/2017   Procedure: TRANSESOPHAGEAL ECHOCARDIOGRAM (TEE);  Surgeon: Lewayne Buntingrenshaw, Brian S, MD;  [facility];  Service: Cardiovascular;  Laterality: N/A;  . TONSILLECTOMY  1982  . TRANSURETHRAL RESECTION OF PROSTATE N/A 07/26/2017   Procedure: TRANSURETHRAL RESECTION OF THE PROSTATE (TURP) WITH UPROOFING OF PROSTATE ABSCESS;  Surgeon: Bjorn PippinWrenn, John, MD;  Location: [facility] ORS;  Service: Urology;  Laterality: N/A;        Home Medications    Prior to Admission medications   Medication Sig Start Date End Date Taking? Authorizing Provider  atorvastatin (LIPITOR) 20 MG  tablet Take 20 mg by mouth daily. 07/30/17   [provider]  glipiZIDE (GLUCOTROL) 10 MG tablet Take 1 tablet (10 mg total) by mouth 2 (two) times daily before a meal. 02/01/18   Baity, Salvadore Oxfordegina W, NP  lisinopril (PRINIVIL,ZESTRIL) 10 MG tablet Take 10 mg by mouth daily.    [provider]  metFORMIN (GLUCOPHAGE) 1000 MG tablet Take 1 tablet (1,000 mg  total) by mouth 2 (two) times daily. COMPLETE PHYSICAL EXAM WITH LABS REQUIRED FOR ADDITIONAL REFILLS 01/16/19 04/16/19  Armbruster, Willaim RayasSteven P, MD  oxyCODONE-acetaminophen (PERCOCET/ROXICET) 5-325 MG tablet Take 1 tablet by mouth every 6 (six) hours as needed for severe pain. 01/16/19   Armbruster, Willaim RayasSteven P, MD  pantoprazole (PROTONIX) 40 MG tablet Take 1 tablet (40 mg total) by mouth 2 (two) times daily. 01/16/19   Armbruster, Willaim RayasSteven P, MD  polyethylene glycol (MIRALAX / GLYCOLAX) packet Take 17 g by mouth daily as needed for mild constipation. 12/24/18   Dorrell, Cathleen Cortieborah N, MD  propranolol (INDERAL) 10 MG tablet Take 1 tablet (10 mg total) by mouth 2 (two) times daily. 01/13/19   Armbruster, Willaim RayasSteven P, MD    Family History Family History  Problem Relation Age of Onset  . Hyperlipidemia Mother   . Diabetes Mother   . Hyperlipidemia Father   . Diabetes Father     Social History Social History   Tobacco Use  . Smoking status: Current Every Day Smoker    Packs/day: 0.50    Years: 32.00    Pack years: 16.00    Types: Cigarettes  . Smokeless tobacco: Never Used  Substance Use Topics  . Alcohol use: Yes    Alcohol/week: 6.0 standard drinks    Types: 6 Cans of beer per week    Comment: "drink qd"-beer-3 cans  . Drug use: Yes    Types: Marijuana    Comment: pt reports last use several months ago.      Allergies   Patient has no known allergies.   Review of Systems Review of Systems  Gastrointestinal: Negative for abdominal pain.  Genitourinary: Positive for testicular pain.  All other systems reviewed and are negative.    Physical Exam Updated Vital Signs SpO2 99%   Physical Exam Vitals signs and nursing note reviewed.  Constitutional:      General: He is not in acute distress.    Appearance: He is well-developed.  HENT:     Head: Normocephalic and atraumatic.  Eyes:     Conjunctiva/sclera: Conjunctivae normal.     Pupils: Pupils are equal, round, and reactive to light.   Neck:     Musculoskeletal: Normal range of motion and neck supple.  Cardiovascular:     Rate and Rhythm: Normal rate.     Pulses: Normal pulses.  Pulmonary:     Effort: Pulmonary effort is normal. No respiratory distress.  Abdominal:     General: There is no distension.     Palpations: Abdomen is soft.     Tenderness: There is no abdominal tenderness. There is no guarding or rebound.     Hernia: There is no hernia in the left inguinal area or right inguinal area.  Genitourinary:    Scrotum/Testes:        Right: Mass, tenderness and swelling present.        Left: Tenderness not present.     Epididymis:     Right: Enlarged. Tenderness present.     Left: No tenderness.     Comments: No palpable  tenderness of the left testicle.  Right testicle is enlarged, firm and tender.  Scrotal skin is normal.  No appreciable inguinal hernias Musculoskeletal: Normal range of motion.        General: No tenderness.  Lymphadenopathy:     Lower Body: No right inguinal adenopathy. No left inguinal adenopathy.  Skin:    General: Skin is warm and dry.     Findings: No erythema or rash.  Neurological:     Mental Status: He is alert and oriented to person, place, and time.  Psychiatric:        Behavior: Behavior normal.      ED Treatments / Results  Labs (all labs ordered are listed, but only abnormal results are displayed) Labs Reviewed  CBC WITH DIFFERENTIAL/PLATELET - Abnormal; Notable for the following components:      Result Value   RDW 15.6 (*)    Monocytes Absolute 1.2 (*)    All other components within normal limits  BASIC METABOLIC PANEL - Abnormal; Notable for the following components:   Sodium 132 (*)    Glucose, Bld 118 (*)    Calcium 8.3 (*)    All other components within normal limits  URINALYSIS, ROUTINE W REFLEX MICROSCOPIC    EKG None  Radiology [place] Scrotum W/doppler  Result Date: 06/23/2019 CLINICAL DATA:  Right testicular pain for 2 days. EXAM: SCROTAL ULTRASOUND  DOPPLER ULTRASOUND OF THE TESTICLES TECHNIQUE: Complete ultrasound examination of the testicles, epididymis, and other scrotal structures was performed. Color and spectral Doppler ultrasound were also utilized to evaluate blood flow to the testicles. COMPARISON:  None. FINDINGS: Right testicle Measurements: 3.8 x 1.9 x 2.4 cm. No mass or microlithiasis visualized. Left testicle Measurements: 3.4 x 2.2 x 1.9 cm. No mass or microlithiasis visualized. Right epididymis:  Heterogeneous and swollen in appearance. Left epididymis:  Heterogeneous and swollen in appearance. Hydrocele:  Small to moderate, right greater than left. Varicocele:  None visualized. Pulsed Doppler interrogation of both testes demonstrates normal low resistance arterial and venous waveforms bilaterally. IMPRESSION: Findings compatible with bilateral epididymitis with associated small to moderate hydroceles, larger on the right. Normal appearing testicles. Electronically Signed   By: Drusilla Kannerhomas  Dalessio M.D.   On: 06/23/2019 11:34    Procedures Procedures (including critical care time)  Medications Ordered in ED Medications  morphine 4 MG/ML injection 4 mg (has no administration in time range)  ondansetron (ZOFRAN) injection 4 mg (has no administration in time range)     Initial Impression / Assessment and Plan / ED Course  I have reviewed the triage vital signs and the nursing notes.  Pertinent labs & imaging results that were available during my care of the patient were reviewed by me and considered in my medical decision making (see chart for details).       58 year old male presenting today with worsening testicular pain over the last 2 days.  This started after a lot of physical activity on Wednesday.  Patient has enlargement, firmness and severe tenderness of his right testicle.  No hernias are appreciated.  He has no adenopathy and denies any urinary symptoms.  Concern for torsion versus epididymitis.  He has no evidence of  scrotal abscess and denies any infectious symptoms.  11:55 AM  Labs at baseline and pt's US shows bilateral epididymitis with normal flow and no signs of abscess.  Pt will need antibiotics and pain control.  Pt given f/u with urology if not improving.  Patient was covered with levofloxacin as there is  a low suspicion for chlamydia or gonorrhea as he has not been sexually active for over a year.  Final Clinical Impressions(s) / ED Diagnoses   Final diagnoses:  Epididymitis    ED Discharge Orders         Ordered    levofloxacin (LEVAQUIN) 750 MG tablet  Daily     06/23/19 1251    oxyCODONE (ROXICODONE) 5 MG immediate release tablet  Every 4 hours PRN     06/23/19 1251           Blanchie Dessert, MD 06/23/19 1251    Blanchie Dessert, MD 06/23/19 1252

## 2019-06-23 NOTE — ED Notes (Signed)
Patient received cab voucher from social work. [employer] cab picked up patient. Patient ambulated from ED without assistance.

## 2019-06-23 NOTE — ED Triage Notes (Signed)
Pt from home via [facility] w/ c/o testicular pain and swelling starting 2 days ago.  Reports was unable to take the pain any longer and came for evaluation.  Pt in NAD, A&Ox4.

## 2019-07-18 ENCOUNTER — Telehealth: Payer: Self-pay

## 2019-07-18 NOTE — Telephone Encounter (Signed)
Tried to call pt to discuss rescheduling his MRI at [facility] that was cancelled.  There was no answer and no way to leave a message.  He also needs to be scheduled for another EGD with banding at [facility] for Esophageal Varices. Dr. Doyne Keel next availability is Tuesday, 08-15-19.

## 2019-07-20 NOTE — Telephone Encounter (Signed)
Called patient again.  No answer, no way to leave a message.

## 2019-07-31 NOTE — Telephone Encounter (Signed)
Called pt. No answer and no way to leave a message.  Mailied letter to pt asking him to call us to reschedule MRI and to discuss EGD at [facility] for Esophageal Varices.

## 2019-11-14 NOTE — Telephone Encounter (Signed)
Called pt to discuss rescheduling his EGD at [facility].  There was no answer and no way to leave a message.

## 2019-11-15 ENCOUNTER — Telehealth: Payer: Self-pay

## 2019-11-15 NOTE — Telephone Encounter (Signed)
2nd Letter sent to patient asking him to contact our office asap to discuss needed recall EGD at [facility] (cirrhosis and esophageal bandings).  If patient calls please GET GOOD CONTACT NUMBER for him.

## 2021-01-19 ENCOUNTER — Encounter (HOSPITAL_COMMUNITY): Admission: EM | Disposition: E | Payer: Self-pay | Source: Home / Self Care

## 2021-01-19 ENCOUNTER — Encounter (HOSPITAL_COMMUNITY): Payer: Self-pay | Admitting: Emergency Medicine

## 2021-01-19 ENCOUNTER — Emergency Department (HOSPITAL_COMMUNITY): Payer: Medicaid Other | Admitting: Anesthesiology

## 2021-01-19 ENCOUNTER — Inpatient Hospital Stay (HOSPITAL_COMMUNITY): Payer: Medicaid Other

## 2021-01-19 ENCOUNTER — Inpatient Hospital Stay (HOSPITAL_COMMUNITY)
Admission: EM | Admit: 2021-01-19 | Discharge: 2021-02-07 | DRG: 853 | Disposition: E | Payer: Medicaid Other | Attending: Pulmonary Disease | Admitting: Pulmonary Disease

## 2021-01-19 ENCOUNTER — Other Ambulatory Visit: Payer: Self-pay

## 2021-01-19 ENCOUNTER — Emergency Department (HOSPITAL_COMMUNITY): Payer: Medicaid Other

## 2021-01-19 DIAGNOSIS — Z833 Family history of diabetes mellitus: Secondary | ICD-10-CM

## 2021-01-19 DIAGNOSIS — F17210 Nicotine dependence, cigarettes, uncomplicated: Secondary | ICD-10-CM | POA: Diagnosis present

## 2021-01-19 DIAGNOSIS — Y712 Prosthetic and other implants, materials and accessory cardiovascular devices associated with adverse incidents: Secondary | ICD-10-CM | POA: Diagnosis not present

## 2021-01-19 DIAGNOSIS — R6521 Severe sepsis with septic shock: Secondary | ICD-10-CM | POA: Diagnosis present

## 2021-01-19 DIAGNOSIS — A419 Sepsis, unspecified organism: Principal | ICD-10-CM | POA: Diagnosis present

## 2021-01-19 DIAGNOSIS — F419 Anxiety disorder, unspecified: Secondary | ICD-10-CM | POA: Diagnosis present

## 2021-01-19 DIAGNOSIS — Z7984 Long term (current) use of oral hypoglycemic drugs: Secondary | ICD-10-CM

## 2021-01-19 DIAGNOSIS — R579 Shock, unspecified: Secondary | ICD-10-CM

## 2021-01-19 DIAGNOSIS — K659 Peritonitis, unspecified: Secondary | ICD-10-CM | POA: Diagnosis present

## 2021-01-19 DIAGNOSIS — D689 Coagulation defect, unspecified: Secondary | ICD-10-CM | POA: Diagnosis present

## 2021-01-19 DIAGNOSIS — D696 Thrombocytopenia, unspecified: Secondary | ICD-10-CM | POA: Diagnosis present

## 2021-01-19 DIAGNOSIS — Z452 Encounter for adjustment and management of vascular access device: Secondary | ICD-10-CM

## 2021-01-19 DIAGNOSIS — T8241XA Breakdown (mechanical) of vascular dialysis catheter, initial encounter: Secondary | ICD-10-CM | POA: Diagnosis not present

## 2021-01-19 DIAGNOSIS — I10 Essential (primary) hypertension: Secondary | ICD-10-CM | POA: Diagnosis present

## 2021-01-19 DIAGNOSIS — E1169 Type 2 diabetes mellitus with other specified complication: Secondary | ICD-10-CM | POA: Diagnosis present

## 2021-01-19 DIAGNOSIS — D72819 Decreased white blood cell count, unspecified: Secondary | ICD-10-CM | POA: Diagnosis not present

## 2021-01-19 DIAGNOSIS — I9589 Other hypotension: Secondary | ICD-10-CM | POA: Diagnosis not present

## 2021-01-19 DIAGNOSIS — K631 Perforation of intestine (nontraumatic): Secondary | ICD-10-CM | POA: Diagnosis present

## 2021-01-19 DIAGNOSIS — K7460 Unspecified cirrhosis of liver: Secondary | ICD-10-CM | POA: Diagnosis present

## 2021-01-19 DIAGNOSIS — Z9079 Acquired absence of other genital organ(s): Secondary | ICD-10-CM

## 2021-01-19 DIAGNOSIS — Z20822 Contact with and (suspected) exposure to covid-19: Secondary | ICD-10-CM | POA: Diagnosis present

## 2021-01-19 DIAGNOSIS — F32A Depression, unspecified: Secondary | ICD-10-CM | POA: Diagnosis present

## 2021-01-19 DIAGNOSIS — F1290 Cannabis use, unspecified, uncomplicated: Secondary | ICD-10-CM | POA: Diagnosis present

## 2021-01-19 DIAGNOSIS — B182 Chronic viral hepatitis C: Secondary | ICD-10-CM | POA: Diagnosis present

## 2021-01-19 DIAGNOSIS — K559 Vascular disorder of intestine, unspecified: Secondary | ICD-10-CM | POA: Diagnosis present

## 2021-01-19 DIAGNOSIS — E872 Acidosis, unspecified: Secondary | ICD-10-CM | POA: Diagnosis present

## 2021-01-19 DIAGNOSIS — E785 Hyperlipidemia, unspecified: Secondary | ICD-10-CM | POA: Diagnosis present

## 2021-01-19 DIAGNOSIS — Z83438 Family history of other disorder of lipoprotein metabolism and other lipidemia: Secondary | ICD-10-CM

## 2021-01-19 DIAGNOSIS — N179 Acute kidney failure, unspecified: Secondary | ICD-10-CM

## 2021-01-19 DIAGNOSIS — K7210 Chronic hepatic failure without coma: Secondary | ICD-10-CM | POA: Diagnosis present

## 2021-01-19 DIAGNOSIS — Z0189 Encounter for other specified special examinations: Secondary | ICD-10-CM

## 2021-01-19 HISTORY — PX: LAPAROTOMY: SHX154

## 2021-01-19 LAB — BLOOD GAS, ARTERIAL
Acid-base deficit: 20.7 mmol/L — ABNORMAL HIGH (ref 0.0–2.0)
Acid-base deficit: 17.6 mmol/L — ABNORMAL HIGH (ref 0.0–2.0)
Bicarbonate: 9.6 mmol/L — ABNORMAL LOW (ref 20.0–28.0)
Bicarbonate: 10.4 mmol/L — ABNORMAL LOW (ref 20.0–28.0)
FIO2: 100
MECHVT: 560 mL
O2 Saturation: 99.4 %
O2 Saturation: 99.2 %
PEEP: 5 cmH2O
Patient temperature: 98.6
Patient temperature: 98.6
RATE: 30 resp/min
pCO2 arterial: 41.8 mmHg (ref 32.0–48.0)
pCO2 arterial: 33.2 mmHg (ref 32.0–48.0)
pH, Arterial: 6.993 — CL (ref 7.350–7.450)
pH, Arterial: 7.124 — CL (ref 7.350–7.450)
pO2, Arterial: 422 mmHg — ABNORMAL HIGH (ref 83.0–108.0)
pO2, Arterial: 375 mmHg — ABNORMAL HIGH (ref 83.0–108.0)

## 2021-01-19 LAB — CBC WITH DIFFERENTIAL/PLATELET
Abs Immature Granulocytes: 0 10*3/uL (ref 0.00–0.07)
Basophils Absolute: 0 10*3/uL (ref 0.0–0.1)
Basophils Relative: 0 %
Eosinophils Absolute: 0 10*3/uL (ref 0.0–0.5)
Eosinophils Relative: 0 %
HCT: 46.8 % (ref 39.0–52.0)
Hemoglobin: 14.5 g/dL (ref 13.0–17.0)
Immature Granulocytes: 0 %
Lymphocytes Relative: 37 %
Lymphs Abs: 0.3 10*3/uL — ABNORMAL LOW (ref 0.7–4.0)
MCH: 31.9 pg (ref 26.0–34.0)
MCHC: 31 g/dL (ref 30.0–36.0)
MCV: 103.1 fL — ABNORMAL HIGH (ref 80.0–100.0)
Monocytes Absolute: 0.1 10*3/uL (ref 0.1–1.0)
Monocytes Relative: 9 %
Neutro Abs: 0.4 10*3/uL — CL (ref 1.7–7.7)
Neutrophils Relative %: 54 %
Platelets: 78 10*3/uL — ABNORMAL LOW (ref 150–400)
RBC: 4.54 MIL/uL (ref 4.22–5.81)
RDW: 14.8 % (ref 11.5–15.5)
WBC: 0.8 10*3/uL — CL (ref 4.0–10.5)
nRBC: 0 % (ref 0.0–0.2)

## 2021-01-19 LAB — CBC
HCT: 30.1 % — ABNORMAL LOW (ref 39.0–52.0)
Hemoglobin: 9.5 g/dL — ABNORMAL LOW (ref 13.0–17.0)
MCH: 32 pg (ref 26.0–34.0)
MCHC: 31.6 g/dL (ref 30.0–36.0)
MCV: 101.3 fL — ABNORMAL HIGH (ref 80.0–100.0)
Platelets: 50 10*3/uL — ABNORMAL LOW (ref 150–400)
RBC: 2.97 MIL/uL — ABNORMAL LOW (ref 4.22–5.81)
RDW: 15 % (ref 11.5–15.5)
WBC: 0.5 10*3/uL — CL (ref 4.0–10.5)
nRBC: 0 % (ref 0.0–0.2)

## 2021-01-19 LAB — COMPREHENSIVE METABOLIC PANEL
ALT: 98 U/L — ABNORMAL HIGH (ref 0–44)
AST: 150 U/L — ABNORMAL HIGH (ref 15–41)
Albumin: 2.5 g/dL — ABNORMAL LOW (ref 3.5–5.0)
Alkaline Phosphatase: 60 U/L (ref 38–126)
BUN: 32 mg/dL — ABNORMAL HIGH (ref 6–20)
CO2: <7 mmol/L — ABNORMAL LOW (ref 22–32)
Calcium: 8.3 mg/dL — ABNORMAL LOW (ref 8.9–10.3)
Chloride: 97 mmol/L — ABNORMAL LOW (ref 98–111)
Creatinine, Ser: 2.63 mg/dL — ABNORMAL HIGH (ref 0.61–1.24)
GFR, Estimated: 27 mL/min — ABNORMAL LOW (ref >60)
Glucose, Bld: 113 mg/dL — ABNORMAL HIGH (ref 70–99)
Potassium: 4.2 mmol/L (ref 3.5–5.1)
Sodium: 134 mmol/L — ABNORMAL LOW (ref 135–145)
Total Bilirubin: 2.2 mg/dL — ABNORMAL HIGH (ref 0.3–1.2)
Total Protein: 7.7 g/dL (ref 6.5–8.1)

## 2021-01-19 LAB — POCT I-STAT 7, (LYTES, BLD GAS, ICA,H+H)
Acid-base deficit: 20 mmol/L — ABNORMAL HIGH (ref 0.0–2.0)
Acid-base deficit: 22 mmol/L — ABNORMAL HIGH (ref 0.0–2.0)
Bicarbonate: 12 mmol/L — ABNORMAL LOW (ref 20.0–28.0)
Bicarbonate: 9.9 mmol/L — ABNORMAL LOW (ref 20.0–28.0)
Calcium, Ion: 0.93 mmol/L — ABNORMAL LOW (ref 1.15–1.40)
Calcium, Ion: 1.1 mmol/L — ABNORMAL LOW (ref 1.15–1.40)
HCT: 35 % — ABNORMAL LOW (ref 39.0–52.0)
HCT: 34 % — ABNORMAL LOW (ref 39.0–52.0)
Hemoglobin: 11.9 g/dL — ABNORMAL LOW (ref 13.0–17.0)
Hemoglobin: 11.6 g/dL — ABNORMAL LOW (ref 13.0–17.0)
O2 Saturation: 100 %
O2 Saturation: 100 %
Patient temperature: 32
Potassium: 4.2 mmol/L (ref 3.5–5.1)
Potassium: 3.5 mmol/L (ref 3.5–5.1)
Sodium: 140 mmol/L (ref 135–145)
Sodium: 139 mmol/L (ref 135–145)
TCO2: 14 mmol/L — ABNORMAL LOW (ref 22–32)
TCO2: 11 mmol/L — ABNORMAL LOW (ref 22–32)
pCO2 arterial: 56.9 mmHg — ABNORMAL HIGH (ref 32.0–48.0)
pCO2 arterial: 35.9 mmHg (ref 32.0–48.0)
pH, Arterial: 6.931 — CL (ref 7.350–7.450)
pH, Arterial: 7.011 — CL (ref 7.350–7.450)
pO2, Arterial: 433 mmHg — ABNORMAL HIGH (ref 83.0–108.0)
pO2, Arterial: 379 mmHg — ABNORMAL HIGH (ref 83.0–108.0)

## 2021-01-19 LAB — POCT I-STAT, CHEM 8
BUN: 39 mg/dL — ABNORMAL HIGH (ref 6–20)
Calcium, Ion: 1.15 mmol/L (ref 1.15–1.40)
Chloride: 102 mmol/L (ref 98–111)
Creatinine, Ser: 2.2 mg/dL — ABNORMAL HIGH (ref 0.61–1.24)
Glucose, Bld: 57 mg/dL — ABNORMAL LOW (ref 70–99)
HCT: 30 % — ABNORMAL LOW (ref 39.0–52.0)
Hemoglobin: 10.2 g/dL — ABNORMAL LOW (ref 13.0–17.0)
Potassium: 4 mmol/L (ref 3.5–5.1)
Sodium: 143 mmol/L (ref 135–145)
TCO2: 20 mmol/L — ABNORMAL LOW (ref 22–32)

## 2021-01-19 LAB — LACTIC ACID, PLASMA
Lactic Acid, Venous: >11 mmol/L (ref 0.5–1.9)
Lactic Acid, Venous: >11 mmol/L (ref 0.5–1.9)
Lactic Acid, Venous: >11 mmol/L (ref 0.5–1.9)

## 2021-01-19 LAB — LIPASE, BLOOD: Lipase: 19 U/L (ref 11–51)

## 2021-01-19 LAB — PREPARE RBC (CROSSMATCH)

## 2021-01-19 LAB — RESP PANEL BY RT-PCR (FLU A&B, COVID) ARPGX2
Influenza A by PCR: NEGATIVE
Influenza B by PCR: NEGATIVE
SARS Coronavirus 2 by RT PCR: NEGATIVE

## 2021-01-19 LAB — GLUCOSE, CAPILLARY
Glucose-Capillary: 115 mg/dL — ABNORMAL HIGH (ref 70–99)
Glucose-Capillary: 75 mg/dL (ref 70–99)

## 2021-01-19 LAB — PROTIME-INR
INR: 3.5 — ABNORMAL HIGH (ref 0.8–1.2)
Prothrombin Time: 34.1 seconds — ABNORMAL HIGH (ref 11.4–15.2)

## 2021-01-19 LAB — FIBRINOGEN: Fibrinogen: 163 mg/dL — ABNORMAL LOW (ref 210–475)

## 2021-01-19 LAB — APTT: aPTT: 53 seconds — ABNORMAL HIGH (ref 24–36)

## 2021-01-19 SURGERY — LAPAROTOMY, EXPLORATORY
Anesthesia: General | Site: Abdomen

## 2021-01-19 MED ORDER — ROCURONIUM BROMIDE 10 MG/ML (PF) SYRINGE
PREFILLED_SYRINGE | INTRAVENOUS | Status: DC | PRN
  Administered 2021-01-19: 60 mg via INTRAVENOUS
  Administered 2021-01-19: 40 mg via INTRAVENOUS

## 2021-01-19 MED ORDER — LACTATED RINGERS IV SOLN: INTRAVENOUS | Status: DC

## 2021-01-19 MED ORDER — NOREPINEPHRINE 4 MG/250ML-% IV SOLN
INTRAVENOUS | Status: DC | PRN
  Administered 2021-01-19: 8 ug/min via INTRAVENOUS

## 2021-01-19 MED ORDER — LACTATED RINGERS IV BOLUS (SEPSIS)
1000.0000 mL | Freq: Once | INTRAVENOUS | Status: AC
  Administered 2021-01-19: 1000 mL via INTRAVENOUS

## 2021-01-19 MED ORDER — EPINEPHRINE PF 1 MG/ML IJ SOLN
INTRAMUSCULAR | Status: DC | PRN
  Administered 2021-01-19: .2 mg via INTRAVENOUS
  Administered 2021-01-19 (×2): .1 mg via INTRAVENOUS

## 2021-01-19 MED ORDER — ALTEPLASE 2 MG IJ SOLR: 2.0000 mg | Freq: Once | INTRAMUSCULAR | Status: DC | PRN

## 2021-01-19 MED ORDER — FENTANYL BOLUS VIA INFUSION
50.0000 ug | INTRAVENOUS | Status: DC | PRN
Start: 2021-01-19 — End: 2021-01-21
  Filled 2021-01-19: qty 50

## 2021-01-19 MED ORDER — SODIUM BICARBONATE 8.4 % IV SOLN
INTRAVENOUS | Status: AC
  Administered 2021-01-19: 100 meq via INTRAVENOUS
  Filled 2021-01-19: qty 50

## 2021-01-19 MED ORDER — FENTANYL 2500MCG IN NS 250ML (10MCG/ML) PREMIX INFUSION
0.0000 ug/h | INTRAVENOUS | Status: DC
  Filled 2021-01-19: qty 250

## 2021-01-19 MED ORDER — SODIUM BICARBONATE 8.4 % IV SOLN: 100.0000 meq | Freq: Once | INTRAVENOUS | Status: AC

## 2021-01-19 MED ORDER — CHLORHEXIDINE GLUCONATE CLOTH 2 % EX PADS
6.0000 | MEDICATED_PAD | Freq: Every day | CUTANEOUS | Status: DC
  Administered 2021-01-19: 6 via TOPICAL

## 2021-01-19 MED ORDER — PRISMASOL BGK 4/2.5 32-4-2.5 MEQ/L EC SOLN: Status: DC

## 2021-01-19 MED ORDER — MIDAZOLAM 50MG/50ML (1MG/ML) PREMIX INFUSION
0.5000 mg/h | INTRAVENOUS | Status: DC
  Administered 2021-01-19: 0.5 mg/h via INTRAVENOUS
  Filled 2021-01-19: qty 50

## 2021-01-19 MED ORDER — MORPHINE SULFATE (PF) 4 MG/ML IV SOLN
4.0000 mg | Freq: Once | INTRAVENOUS | Status: AC
  Administered 2021-01-19: 4 mg via INTRAVENOUS
  Filled 2021-01-19: qty 1

## 2021-01-19 MED ORDER — ALBUMIN HUMAN 5 % IV SOLN
INTRAVENOUS | Status: AC
  Filled 2021-01-19: qty 1000

## 2021-01-19 MED ORDER — FENTANYL CITRATE (PF) 250 MCG/5ML IJ SOLN
INTRAMUSCULAR | Status: AC
  Filled 2021-01-19: qty 5

## 2021-01-19 MED ORDER — MIDAZOLAM HCL 2 MG/2ML IJ SOLN
INTRAMUSCULAR | Status: AC
  Filled 2021-01-19: qty 2

## 2021-01-19 MED ORDER — SODIUM CHLORIDE 0.9 % FOR CRRT: INTRAVENOUS_CENTRAL | Status: DC | PRN

## 2021-01-19 MED ORDER — PHENYLEPHRINE HCL-NACL 10-0.9 MG/250ML-% IV SOLN
INTRAVENOUS | Status: DC | PRN
  Administered 2021-01-19: 50 ug/min via INTRAVENOUS

## 2021-01-19 MED ORDER — MIDAZOLAM BOLUS VIA INFUSION
1.0000 mg | INTRAVENOUS | Status: DC | PRN
  Filled 2021-01-19: qty 2

## 2021-01-19 MED ORDER — PROPOFOL 10 MG/ML IV BOLUS
INTRAVENOUS | Status: AC
  Filled 2021-01-19: qty 20

## 2021-01-19 MED ORDER — LIDOCAINE 2% (20 MG/ML) 5 ML SYRINGE
INTRAMUSCULAR | Status: AC
  Filled 2021-01-19: qty 10

## 2021-01-19 MED ORDER — LACTATED RINGERS IV SOLN: INTRAVENOUS | Status: DC | PRN

## 2021-01-19 MED ORDER — ONDANSETRON HCL 4 MG/2ML IJ SOLN
INTRAMUSCULAR | Status: AC
  Filled 2021-01-19: qty 2

## 2021-01-19 MED ORDER — SUCCINYLCHOLINE CHLORIDE 200 MG/10ML IV SOSY
PREFILLED_SYRINGE | INTRAVENOUS | Status: AC
  Filled 2021-01-19: qty 20

## 2021-01-19 MED ORDER — SODIUM BICARBONATE 8.4 % IV SOLN
INTRAVENOUS | Status: DC
  Filled 2021-01-19: qty 850
  Filled 2021-01-19: qty 150
  Filled 2021-01-19: qty 850

## 2021-01-19 MED ORDER — PHENYLEPHRINE HCL (PRESSORS) 10 MG/ML IV SOLN
INTRAVENOUS | Status: AC
  Filled 2021-01-19: qty 1

## 2021-01-19 MED ORDER — SODIUM CHLORIDE 0.9 % IV SOLN: INTRAVENOUS | Status: DC | PRN

## 2021-01-19 MED ORDER — ORAL CARE MOUTH RINSE
15.0000 mL | OROMUCOSAL | Status: DC
  Administered 2021-01-19 – 2021-01-20 (×6): 15 mL via OROMUCOSAL

## 2021-01-19 MED ORDER — VASOPRESSIN 20 UNITS/100 ML INFUSION FOR SHOCK
0.0000 [IU]/min | INTRAVENOUS | Status: DC
  Administered 2021-01-19 – 2021-01-20 (×3): 0.03 [IU]/min via INTRAVENOUS
  Filled 2021-01-19 (×3): qty 100

## 2021-01-19 MED ORDER — LIDOCAINE 2% (20 MG/ML) 5 ML SYRINGE
INTRAMUSCULAR | Status: DC | PRN
  Administered 2021-01-19: 60 mg via INTRAVENOUS

## 2021-01-19 MED ORDER — FENTANYL CITRATE (PF) 250 MCG/5ML IJ SOLN
INTRAMUSCULAR | Status: DC | PRN
  Administered 2021-01-19: 100 ug via INTRAVENOUS

## 2021-01-19 MED ORDER — PHENYLEPHRINE 40 MCG/ML (10ML) SYRINGE FOR IV PUSH (FOR BLOOD PRESSURE SUPPORT)
PREFILLED_SYRINGE | INTRAVENOUS | Status: AC
  Filled 2021-01-19: qty 10

## 2021-01-19 MED ORDER — DEXTROSE 50 % IV SOLN
INTRAVENOUS | Status: AC
  Filled 2021-01-19: qty 50

## 2021-01-19 MED ORDER — METRONIDAZOLE IN NACL 5-0.79 MG/ML-% IV SOLN
500.0000 mg | Freq: Once | INTRAVENOUS | Status: AC
  Administered 2021-01-19: 500 mg via INTRAVENOUS
  Filled 2021-01-19: qty 100

## 2021-01-19 MED ORDER — CHLORHEXIDINE GLUCONATE 0.12% ORAL RINSE (MEDLINE KIT)
15.0000 mL | Freq: Two times a day (BID) | OROMUCOSAL | Status: DC
  Administered 2021-01-19: 15 mL via OROMUCOSAL

## 2021-01-19 MED ORDER — 0.9 % SODIUM CHLORIDE (POUR BTL) OPTIME
TOPICAL | Status: DC | PRN
  Administered 2021-01-19: 4000 mL

## 2021-01-19 MED ORDER — STERILE WATER FOR INJECTION IV SOLN
INTRAVENOUS | Status: DC
  Filled 2021-01-19 (×3): qty 150

## 2021-01-19 MED ORDER — NOREPINEPHRINE 16 MG/250ML-% IV SOLN
0.5000 ug/min | INTRAVENOUS | Status: DC
  Administered 2021-01-19: 60 ug/min via INTRAVENOUS
  Administered 2021-01-20 (×4): 70 ug/min via INTRAVENOUS
  Filled 2021-01-19 (×5): qty 250

## 2021-01-19 MED ORDER — SODIUM BICARBONATE 8.4 % IV SOLN
INTRAVENOUS | Status: AC
  Filled 2021-01-19: qty 100

## 2021-01-19 MED ORDER — ALBUMIN HUMAN 5 % IV SOLN: INTRAVENOUS | Status: DC | PRN

## 2021-01-19 MED ORDER — HYDROMORPHONE HCL 1 MG/ML IJ SOLN: 1.0000 mg | INTRAMUSCULAR | Status: DC | PRN

## 2021-01-19 MED ORDER — EPINEPHRINE HCL 5 MG/250ML IV SOLN IN NS
0.5000 ug/min | INTRAVENOUS | Status: DC
  Administered 2021-01-19: 10 ug/min via INTRAVENOUS
  Administered 2021-01-19: 70 ug/min via INTRAVENOUS
  Administered 2021-01-19: 60 ug/min via INTRAVENOUS
  Administered 2021-01-20 (×5): 70 ug/min via INTRAVENOUS
  Administered 2021-01-20: 5.333 ug/min via INTRAVENOUS
  Administered 2021-01-20 (×5): 70 ug/min via INTRAVENOUS
  Filled 2021-01-19 (×16): qty 250

## 2021-01-19 MED ORDER — CHLORHEXIDINE GLUCONATE CLOTH 2 % EX PADS: 6.0000 | MEDICATED_PAD | Freq: Once | CUTANEOUS | Status: DC

## 2021-01-19 MED ORDER — HEPARIN SODIUM (PORCINE) 1000 UNIT/ML DIALYSIS
1000.0000 [IU] | INTRAMUSCULAR | Status: DC | PRN
Start: 2021-01-19 — End: 2021-01-21
  Administered 2021-01-19 – 2021-01-20 (×2): 3000 [IU] via INTRAVENOUS_CENTRAL
  Filled 2021-01-19: qty 6

## 2021-01-19 MED ORDER — DEXTROSE 50 % IV SOLN
INTRAVENOUS | Status: DC | PRN
  Administered 2021-01-19: 50 g via INTRAVENOUS

## 2021-01-19 MED ORDER — CALCIUM CHLORIDE 10 % IV SOLN
INTRAVENOUS | Status: DC | PRN
  Administered 2021-01-19 (×20): 100 mg via INTRAVENOUS

## 2021-01-19 MED ORDER — CALCIUM CHLORIDE 10 % IV SOLN
1.0000 g | Freq: Once | INTRAVENOUS | Status: AC
  Administered 2021-01-19: 1 g via INTRAVENOUS
  Filled 2021-01-19: qty 10

## 2021-01-19 MED ORDER — VANCOMYCIN HCL IN DEXTROSE 1-5 GM/200ML-% IV SOLN
1000.0000 mg | Freq: Once | INTRAVENOUS | Status: AC
  Administered 2021-01-19: 1000 mg via INTRAVENOUS
  Filled 2021-01-19: qty 200

## 2021-01-19 MED ORDER — DEXAMETHASONE SODIUM PHOSPHATE 10 MG/ML IJ SOLN
INTRAMUSCULAR | Status: AC
  Filled 2021-01-19: qty 1

## 2021-01-19 MED ORDER — ONDANSETRON HCL 4 MG/2ML IJ SOLN
4.0000 mg | Freq: Once | INTRAMUSCULAR | Status: AC
  Administered 2021-01-19: 4 mg via INTRAVENOUS
  Filled 2021-01-19: qty 2

## 2021-01-19 MED ORDER — NOREPINEPHRINE 4 MG/250ML-% IV SOLN
0.0000 ug/min | INTRAVENOUS | Status: DC
  Administered 2021-01-19: 30 ug/min via INTRAVENOUS

## 2021-01-19 MED ORDER — VASOPRESSIN 20 UNIT/ML IV SOLN
INTRAVENOUS | Status: DC | PRN
  Administered 2021-01-19 (×2): 2 [IU] via INTRAVENOUS
  Administered 2021-01-19: 1 [IU] via INTRAVENOUS
  Administered 2021-01-19: 4 [IU] via INTRAVENOUS
  Administered 2021-01-19 (×2): 1 [IU] via INTRAVENOUS
  Administered 2021-01-19: 4 [IU] via INTRAVENOUS
  Administered 2021-01-19 (×3): 2 [IU] via INTRAVENOUS
  Administered 2021-01-19: 4 [IU] via INTRAVENOUS

## 2021-01-19 MED ORDER — SODIUM CHLORIDE 0.9 % IV SOLN
2.0000 g | Freq: Once | INTRAVENOUS | Status: AC
  Administered 2021-01-19: 2 g via INTRAVENOUS
  Filled 2021-01-19: qty 2

## 2021-01-19 MED ORDER — NOREPINEPHRINE 4 MG/250ML-% IV SOLN
INTRAVENOUS | Status: AC
  Filled 2021-01-19: qty 250

## 2021-01-19 MED ORDER — MIDAZOLAM HCL 2 MG/2ML IJ SOLN
INTRAMUSCULAR | Status: DC | PRN
  Administered 2021-01-19 (×2): 2 mg via INTRAVENOUS

## 2021-01-19 MED ORDER — VASOPRESSIN 20 UNIT/ML IV SOLN
INTRAVENOUS | Status: AC
  Filled 2021-01-19: qty 1

## 2021-01-19 MED ORDER — CALCIUM GLUCONATE 10 % IV SOLN
1.0000 g | Freq: Once | INTRAVENOUS | Status: AC
  Administered 2021-01-19: 1 g via INTRAVENOUS
  Filled 2021-01-19: qty 10

## 2021-01-19 MED ORDER — PHENYLEPHRINE HCL (PRESSORS) 10 MG/ML IV SOLN
INTRAVENOUS | Status: DC | PRN
  Administered 2021-01-19 (×2): 120 ug via INTRAVENOUS

## 2021-01-19 MED ORDER — SODIUM BICARBONATE 8.4 % IV SOLN
100.0000 meq | Freq: Once | INTRAVENOUS | Status: AC
  Administered 2021-01-19: 100 meq via INTRAVENOUS

## 2021-01-19 MED ORDER — ROCURONIUM BROMIDE 10 MG/ML (PF) SYRINGE
PREFILLED_SYRINGE | INTRAVENOUS | Status: AC
  Filled 2021-01-19: qty 20

## 2021-01-19 MED ORDER — SODIUM BICARBONATE 8.4 % IV SOLN
INTRAVENOUS | Status: DC | PRN
  Administered 2021-01-19 (×5): 50 meq via INTRAVENOUS

## 2021-01-19 MED ORDER — SODIUM BICARBONATE 8.4 % IV SOLN
INTRAVENOUS | Status: AC
  Administered 2021-01-19: 100 meq via INTRAVENOUS
  Filled 2021-01-19: qty 100

## 2021-01-19 MED ORDER — DEXTROSE-NACL 5-0.9 % IV SOLN: INTRAVENOUS | Status: DC

## 2021-01-19 MED ORDER — SODIUM CHLORIDE 0.9 % IV SOLN: 2.0000 g | INTRAVENOUS | Status: DC

## 2021-01-19 MED ORDER — SODIUM BICARBONATE 8.4 % IV SOLN
INTRAVENOUS | Status: AC
  Filled 2021-01-19: qty 50

## 2021-01-19 MED ORDER — PIPERACILLIN-TAZOBACTAM 3.375 G IVPB
3.3750 g | Freq: Three times a day (TID) | INTRAVENOUS | Status: DC
  Administered 2021-01-19: 3.375 g via INTRAVENOUS
  Filled 2021-01-19: qty 50

## 2021-01-19 MED ORDER — CALCIUM CHLORIDE 10 % IV SOLN
INTRAVENOUS | Status: AC
  Filled 2021-01-19: qty 10

## 2021-01-19 MED ORDER — SODIUM BICARBONATE 8.4 % IV SOLN
100.0000 meq | Freq: Once | INTRAVENOUS | Status: DC
  Filled 2021-01-19: qty 100

## 2021-01-19 MED ORDER — SUCCINYLCHOLINE CHLORIDE 200 MG/10ML IV SOSY
PREFILLED_SYRINGE | INTRAVENOUS | Status: DC | PRN
  Administered 2021-01-19: 100 mg via INTRAVENOUS

## 2021-01-19 MED ORDER — SODIUM BICARBONATE 8.4 % IV SOLN
INTRAVENOUS | Status: AC
  Administered 2021-01-20: 50 meq
  Filled 2021-01-19: qty 50

## 2021-01-19 MED ORDER — ETOMIDATE 2 MG/ML IV SOLN
INTRAVENOUS | Status: DC | PRN
  Administered 2021-01-19: 10 mg via INTRAVENOUS

## 2021-01-19 MED ORDER — VASOPRESSIN 20 UNITS/100 ML INFUSION FOR SHOCK
INTRAVENOUS | Status: DC | PRN
  Administered 2021-01-19: .03 [IU]/min via INTRAVENOUS

## 2021-01-19 MED ORDER — FUROSEMIDE 10 MG/ML IJ SOLN: 80.0000 mg | Freq: Once | INTRAMUSCULAR | Status: DC

## 2021-01-19 SURGICAL SUPPLY — 45 items
APPLICATOR COTTON TIP 6 STRL (MISCELLANEOUS) ×1 IMPLANT
APPLICATOR COTTON TIP 6IN STRL (MISCELLANEOUS) ×2
BLADE EXTENDED COATED 6.5IN (ELECTRODE) ×2 IMPLANT
BLADE HEX COATED 2.75 (ELECTRODE) ×2 IMPLANT
CANISTER WOUNDNEG PRESSURE 500 (CANNISTER) ×2 IMPLANT
COVER MAYO STAND STRL (DRAPES) IMPLANT
COVER WAND RF STERILE (DRAPES) IMPLANT
DRAPE INCISE IOBAN 85X60 (DRAPES) ×4 IMPLANT
DRAPE LAPAROSCOPIC ABDOMINAL (DRAPES) ×2 IMPLANT
DRAPE WARM FLUID 44X44 (DRAPES) ×2 IMPLANT
DRSG VAC ATS LRG SENSATRAC (GAUZE/BANDAGES/DRESSINGS) ×2 IMPLANT
ELECT PENCIL ROCKER SW 15FT (MISCELLANEOUS) ×2 IMPLANT
ELECT REM PT RETURN 15FT ADLT (MISCELLANEOUS) ×2 IMPLANT
GAUZE SPONGE 4X4 12PLY STRL (GAUZE/BANDAGES/DRESSINGS) ×2 IMPLANT
GLOVE SURG LTX SZ8 (GLOVE) ×2 IMPLANT
GLOVE SURG UNDER LTX SZ8 (GLOVE) ×4 IMPLANT
GLOVE SURG UNDER POLY LF SZ7 (GLOVE) ×2 IMPLANT
GOWN STRL REUS W/TWL LRG LVL3 (GOWN DISPOSABLE) ×2 IMPLANT
GOWN STRL REUS W/TWL XL LVL3 (GOWN DISPOSABLE) ×4 IMPLANT
HANDLE SUCTION POOLE (INSTRUMENTS) ×1 IMPLANT
KIT BASIN OR (CUSTOM PROCEDURE TRAY) ×2 IMPLANT
KIT TURNOVER KIT A (KITS) ×2 IMPLANT
LIGASURE IMPACT 36 18CM CVD LR (INSTRUMENTS) ×2 IMPLANT
NS IRRIG 1000ML POUR BTL (IV SOLUTION) ×2 IMPLANT
PACK GENERAL/GYN (CUSTOM PROCEDURE TRAY) ×2 IMPLANT
PENCIL SMOKE EVACUATOR (MISCELLANEOUS) IMPLANT
RELOAD PROXIMATE 75MM BLUE (ENDOMECHANICALS) ×2 IMPLANT
SPONGE ABDOMINAL VAC ABTHERA (MISCELLANEOUS) ×2 IMPLANT
SPONGE LAP 18X18 RF (DISPOSABLE) ×2 IMPLANT
STAPLER PROXIMATE 75MM BLUE (STAPLE) ×2 IMPLANT
STAPLER VISISTAT 35W (STAPLE) ×2 IMPLANT
SUCTION POOLE HANDLE (INSTRUMENTS) ×2
SUT PDS AB 1 CTX 36 (SUTURE) IMPLANT
SUT SILK 2 0 (SUTURE)
SUT SILK 2 0 SH CR/8 (SUTURE) ×2 IMPLANT
SUT SILK 2-0 18XBRD TIE 12 (SUTURE) IMPLANT
SUT SILK 3 0 (SUTURE)
SUT SILK 3 0 SH CR/8 (SUTURE) ×2 IMPLANT
SUT SILK 3-0 18XBRD TIE 12 (SUTURE) IMPLANT
SUT VIC AB 2-0 SH 18 (SUTURE) IMPLANT
SUT VIC AB 3-0 SH 18 (SUTURE) IMPLANT
TOWEL OR 17X26 10 PK STRL BLUE (TOWEL DISPOSABLE) ×4 IMPLANT
TRAY FOLEY MTR SLVR 16FR STAT (SET/KITS/TRAYS/PACK) IMPLANT
YANKAUER SUCT BULB TIP 10FT TU (MISCELLANEOUS) ×2 IMPLANT
YANKAUER SUCT BULB TIP NO VENT (SUCTIONS) IMPLANT

## 2021-01-19 NOTE — Anesthesia Procedure Notes (Signed)
Procedure Name: Intubation Date/Time: 02-17-2021 3:35 PM Performed by: Minerva Ends, CRNA Pre-anesthesia Checklist: Patient identified, Emergency Drugs available, Suction available and Patient being monitored Patient Re-evaluated:Patient Re-evaluated prior to induction Oxygen Delivery Method: Circle System Utilized Preoxygenation: Pre-oxygenation with 100% oxygen Induction Type: IV induction Ventilation: Mask ventilation without difficulty Laryngoscope Size: Meyers and 2 Grade View: Grade I Tube type: Oral Number of attempts: 1 Airway Equipment and Method: Stylet Placement Confirmation: ETT inserted through vocal cords under direct vision,  positive ETCO2 and breath sounds checked- equal and bilateral Secured at: 22 cm Tube secured with: Tape Dental Injury: Teeth and Oropharynx as per pre-operative assessment  Comments: Smooth RSI- Moser- intubation AM CRNA atraumatic-- no teeth as preop bilat BS Moser

## 2021-01-19 NOTE — Progress Notes (Addendum)
eLink Physician-Brief Progress Note Patient Name: Cameron Coffey DOB: 1961-08-08 MRN: [id number]   Date of Service  02-05-2021  HPI/Events of Note  S/p OR for partial colectomy for infarcted bowel c/b peritonitis. He now returns to the ICU post-op intubated and in shock and metabolic disarray.  ABG: 6.99/42/422/--/BE -21.  Vent Mode: PRVC FiO2 (%):  [100 %] 100 % Set Rate:  [16 bmp] 16 bmp Vt Set:  [560 mL] 560 mL PEEP:  [5 cmH20] 5 cmH20 Plateau Pressure:  [20 cmH20] 20 cmH20  He is maxed on levophed drip. MAP is 63.  CVP is 18.   eICU Interventions  Give 2 amps of NaHCO3. Start D5 NaHCO3 @ 200cc/hr.  Add vasopressin drip and increase levophed upper limit.  Change vent settings to the following: PRVC 560x30, PEEP 5, titrate FiO2 to sats 92-96%.  Repeat ABG at 2015 hrs.  I have alerted Dr. Ardeth Perfect (ground team) that this patient will need a dialysis catheter inserted for CRRT to correct his severe metabolic acidosis.  I have paged Nephrology at [facility] to discuss CRRT initiation.   ADDENDUM 2021/02/05 7:53 PM  - Dr. Arlean Hopping from Nephrology returned my page and agrees with initiation of CRRT. He will see patient shortly and write orders.  Intervention Category Major Interventions: Acid-Base disturbance - evaluation and management;Hypotension - evaluation and management  Janae Bridgeman 02/05/21, 7:35 PM

## 2021-01-19 NOTE — Anesthesia Procedure Notes (Signed)
Central Venous Catheter Insertion Performed by: Val Eagle, MD, anesthesiologist Start/EndMarch 31, 2022 3:45 PM, Feb 06, 2021 3:59 PM Patient location: OR. Emergency situation Preanesthetic checklist: patient identified, IV checked, risks and benefits discussed, surgical consent, monitors and equipment checked, pre-op evaluation, timeout performed and anesthesia consent Hand hygiene performed  and maximum sterile barriers used  Catheter size: 8 Fr Total catheter length 16. Central line was placed.Double lumen Procedure performed using ultrasound guided technique. Ultrasound Notes:anatomy identified, needle tip was noted to be adjacent to the nerve/plexus identified, no ultrasound evidence of intravascular and/or intraneural injection and image(s) printed for medical record Attempts: 1 Following insertion, dressing applied, line sutured and Biopatch. Post procedure assessment: blood return through all ports, free fluid flow and no air  Patient tolerated the procedure well with no immediate complications.

## 2021-01-19 NOTE — Procedures (Signed)
Central Venous Catheter Insertion Procedure Note  Cameron Coffey  [id number]  1961-08-03  Date:01/16/2021  Time:9:13 PM   Provider Performing:Seong-Joo Ardeth Perfect   Procedure: Insertion of Non-tunneled Central Venous Catheter(36556) with US guidance (54656)   Indication(s) Hemodialysis  Consent Unable to obtain consent due to emergent nature of procedure.  Anesthesia Topical only with 1% lidocaine   Timeout Verified patient identification, verified procedure, site/side was marked, verified correct patient position, special equipment/implants available, medications/allergies/relevant history reviewed, required imaging and test results available.  Sterile Technique Maximal sterile technique including full sterile barrier drape, hand hygiene, sterile gown, sterile gloves, mask, hair covering, sterile ultrasound probe cover (if used).  Procedure Description Area of catheter insertion was cleaned with chlorhexidine and draped in sterile fashion.  With real-time ultrasound guidance a 13 Fr, 15 cm Trialysis HD catheter was placed into the right internal jugular vein. Nonpulsatile blood flow and easy flushing noted in all ports.  The catheter was sutured in place and sterile dressing applied.  Complications/Tolerance None; patient tolerated the procedure well. Chest X-ray is ordered to verify placement.  EBL Minimal  Specimen(s) None  Marcelle Smiling, MD Board Certified by the ABIM, Pulmonary Diseases & Critical Care Medicine

## 2021-01-19 NOTE — ED Triage Notes (Signed)
[facility], patient has had abd pain x3 days, minor urine output, no BM, distention noted to bladder. EMS gave 100 mcg IV, patient tachycardic (106).

## 2021-01-19 NOTE — Op Note (Addendum)
Preoperative diagnosis: Peritonitis with possible mesenteric ischemia  Postoperative diagnosis: Mesenteric ischemia with necrotic right colon and perforation  Procedure: Exploratory laparotomy with partial colectomy and placement of vacuum pack dressing secondary to overwhelming sepsis  Surgeon: Harriette Bouillon, MD  Anesthesia: General  EBL: 200 cc  Drains: Vacuum pack dressing  Specimen: Right colon and terminal ileum to pathology  Indications for procedure: The patient is a 60 year old male who has had abdominal pain since Wednesday 4 days ago.  He is incarcerated and found was brought in today by the police department.  Upon evaluation he had a lactate of 11, peritonitis, a white count less than 1000, and a CT scan finding worrisome for bowel edema or enteritis.  He also was in renal failure.  He developed hypotension.  I discussed his condition as well as his history of cirrhosis and end-stage liver disease.  Given these findings he had a high operative morbidity and mortality in a high risk of an intra-abdominal infection, abscess, deep space infection.  We discussed at great length all these findings as well as medical problems and the potential mortality of 60% in this case and complication rate of 30 to 80% from anywhere from an abscess to wound infection to multiple surgeries to her bowel loss DVT MI pulmonary failure renal failure requiring other treatments in her dialysis.  He understand all these risk but he wished to proceed as a life saving measure given that without surgery he would die.  Discussed risk of bleeding and other complications and blood transfusions due to his severe liver disease secondary to hepatitis and alcohol use.  Description of procedure the patient was brought directly the operating room.  Once he is placed supine he had issues with hypotension.  Anesthesia placed central lines and had to intubate him then.  He was resuscitated on the table.  Once his blood  pressure stabilized we were then able to prepped and draped him in a sterile fashion.  Timeout was done.  A 5 mm laparoscope was placed using an Optiview the left lower quadrant.  This was done without injury to his bowel.  Upon inspection he had significant peritonitis, purulence but his bowel appeared viable by laparoscopy.  I felt that exploration was warranted.  Laparoscope was removed and CO2 was allowed to escape.  Midline incision was used.  Dissection was carried down to the linea alba and's was opened.  Upon entering he had severe bowel swelling in fibrinous exudate throughout the bowel.  I did not see an actual per se perforation but the right colon appeared ischemic and no most necrotic.  I ran the small bowel from the ligament of Treitz down to the ileocecal valve.  There is some areas of dusky ischemia but the bowel appeared viable and I did not feel resection at this point was going to be helpful.  The majority of the small bowel appeared viable to me with a couple of skip areas that looked ischemic but not necrotic.  The right colon was necrotic.  I divided the transverse colon using a GIA 75 stapler.  We then divided the terminal ileum.  We then took the mesentery down with the LigaSure.  Due to his collaterals and cirrhosis, he had a fair amount of oozing from the right operative bed.  There is significant inflammation in this area making visibility somewhat difficult.  The right ureter was out of the operative field to check for this.  Irrigation was used.  I opted to  place a pack in the right upper quadrant and leave this for hemostasis purposes due to his coagulopathy, overwhelming sepsis and cirrhosis.  I ran the small bowel again backwards and this actually little bit better.  I examined the duodenum looked normal.  I saw the stomach it was normal.  He had significant cirrhosis and a massively enlarged liver with micronodular cirrhotic changes noted.  The duodenum appeared normal without signs  of perforation or ischemia.  The remainder of the transverse colon, splenic flexure, descending colon, sigmoid colon and rectum appeared viable without ischemia at this point.  Irrigation was used and suctioned out.  He was's hemodynamically unstable requiring inotropic support therefore I felt that packing him and closing him in getting to the ICU soon as possible for resuscitation was the next best step.  Vacuum pack dressing was placed in a standard fashion using Ioban drape for the seal.  The bag was placed in the bowel with a sponge on top of this and the Ioban on top of that.  Of note there is a 1 pack left which I verified with the scrub nurse to be in the right upper quadrant for hemostasis purposes due to his coagulopathy.  At this point time he is taken to the ICU in critical condition.  These findings were relayed to the ICU physician.  The counts are otherwise correct except for the one sponge which was verified and used for hemostasis.  Plan will be to return the operating room for if he survives in the next 24 to 48 hours.  Patient noted to have pulses throughout his mesentery.   CASE DATA:  Type of patient?: LDOW CASE (Surgical Hospitalist [facility] Inpatient)  Status of Case? EMERGENT Add On  Infection Present At Time Of Surgery (PATOS)?  PURULENCE at the right colon and all peritoneal surfaces

## 2021-01-19 NOTE — Interval H&P Note (Signed)
History and Physical Interval Note:  02/17/21 3:15 PM  Cameron Coffey  has presented today for surgery, with the diagnosis of MESENTERIC ISCHEMIA.  The various methods of treatment have been discussed with the patient and family. After consideration of risks, benefits and other options for treatment, the patient has consented to  Procedure(s): DIAGNOSTIC LAPAROSCOPY; POSSIBLE EXPLORATORY LAPAROTOMY; POSSIBLE BOWEL RESECTION (N/A) as a surgical intervention.  The patient's history has been reviewed, patient examined, no change in status, stable for surgery.  I have reviewed the patient's chart and labs.  Questions were answered to the patient's satisfaction.     Anushri Casalino A Thessaly Mccullers

## 2021-01-19 NOTE — Anesthesia Preprocedure Evaluation (Addendum)
Anesthesia Evaluation  Patient identified by MRN, date of birth, ID bandGeneral Assessment Comment:Awake in acute distress  Reviewed: Allergy & Precautions, NPO status , Patient's Chart, lab work & pertinent test resultsPreop documentation limited or incomplete due to emergent nature of procedure.  History of Anesthesia Complications Negative for: history of anesthetic complications  Airway Mallampati: II  TM Distance: >3 FB Neck ROM: Full    Dental  (+) Edentulous Upper, Edentulous Lower   Pulmonary Current Smoker,    breath sounds clear to auscultation       Cardiovascular hypertension, Pt. on medications  Rhythm:Regular  Left ventricle: Systolic function was normal. The estimated  ejection fraction was in the range of 55% to 60%. Wall motion was  normal; there were no regional wall motion abnormalities.  - Aortic valve: No evidence of vegetation.  - Mitral valve: No evidence of vegetation.  - Left atrium: No evidence of thrombus in the atrial cavity or  appendage.  - Right atrium: No evidence of thrombus in the atrial cavity or  appendage.  - Atrial septum: No defect or patent foramen ovale was identified.  - Tricuspid valve: No evidence of vegetation.  - Pulmonic valve: No evidence of vegetation.    Neuro/Psych negative neurological ROS     GI/Hepatic (+) Cirrhosis   Esophageal Varices    , Hepatitis -, C? Ischemic bowel  Lactic Acid, Venous        Component                Value               Date/Time                 LATICACIDVEN             >11.0 (HH)          01/11/2021 1345          Endo/Other  diabetesLab Results      Component                Value               Date                      HGBA1C                   11.2 (H)            01/25/2018             Renal/GU ARFRenal diseaseLab Results      Component                Value               Date                      CREATININE               2.63  (H)            01/18/2021           Lab Results      Component                Value               Date                      K  4.2                 2021-01-29                Musculoskeletal   Abdominal   Peds  Hematology Lab Results      Component                Value               Date                      WBC                      0.8 (LL)            2021/01/29                HGB                      14.5                01/29/21                HCT                      46.8                01-29-2021                MCV                      103.1 (H)           2021-01-29                PLT                      78 (L)              01/29/21            No coag results     Anesthesia Other Findings   Reproductive/Obstetrics                           Anesthesia Physical Anesthesia Plan  ASA: V and emergent  Anesthesia Plan: General   Post-op Pain Management:    Induction: Intravenous, Rapid sequence and Cricoid pressure planned  PONV Risk Score and Plan: 1 and Treatment may vary due to age or medical condition  Airway Management Planned: Oral ETT  Additional Equipment: Arterial line, CVP and Ultrasound Guidance Line Placement  Intra-op Plan:   Post-operative Plan: Post-operative intubation/ventilation  Informed Consent:     History available from chart only and Only emergency history available  Plan Discussed with: CRNA and Surgeon  Anesthesia Plan Comments: (Patient in ED without surgical consent complet, denies cardiac history, consents to anesthesia including intubation, a line, central line, post op intubation, icu transfer, and full code. Patient willing to receive blood products. Discussed seriousness of critical condition and risk of death, mi, stroke etc with surgery/anesthesia. Patient will to proceed. Transferred to OR with guard. )       Anesthesia Quick Evaluation

## 2021-01-19 NOTE — ED Notes (Addendum)
WBC 0.8 Abs neu 0.4  Long, MD notified

## 2021-01-19 NOTE — ED Notes (Signed)
Report given to OR nurse

## 2021-01-19 NOTE — Consult Note (Signed)
NAME:  Cameron Coffey, MRN:  [id number], DOB:  06/01/1961, LOS: 0 ADMISSION DATE:  01/28/2021, CONSULTATION DATE:  01/08/2021 REFERRING MD:  EDP, CHIEF COMPLAINT:  abd pain/ shock   Brief History:  45 yobm smoker/ on ACEi/inderol  which says he took  am 3/13 before being brought from jail  to ER am 3/13 for abd pain and no UOPx 48 h / folund to have acute renal failure and lactic acidosis with CT c/w ishemic colitis and PCCM consulted pm 3/13 prior to planned exp lap by Dr Diona Foley  History of Present Illness:  60 y.o. male with past medical history reviewed below including prior prostate surgery presents to the emergency department with severe suprapubic pain and no urine output for the past 24 to 48 hours according to the patient.  He arrives from jail.  He states that he is developed worsening pain to the point of being severe which prompted ED transfer.  He denied any fevers and not having chest pain or shortness of breath or cough, N or V.  He received 100 mcg of fentanyl with EMS and was noted to be tachycardic. Pain is constant and severe with no modifying factors.   Past Medical History:  Cirrhosis  Depression HCV (hepatitis C virus) Heart murmur - echo 05/18/61 Grade I diastolic dysfunction only/ TEE no valvular dz  Hypertension Type II diabetes mellitus  Significant Hospital Events:  To or pm 3/13   Consults:  PCCM 3/13 Gen Surgery 3/13   Procedures:     Significant Diagnostic Tests:  Abd/pelvic CT 1. Stable cirrhotic changes involving the liver. No obvious hepatic lesions without contrast. 2. Cholelithiasis. 3. Bilateral renal calculi but no obstructing ureteral calculi or hydroureteronephrosis. 4. Diffuse bowel wall thickening could be due to the patient's cirrhosis and low albumin. 5. High attenuation material in the bladder could be Hemorrhage/clot./ no evidence urinary obst DIC profile 3/13   Micro Data:  Viral PCR Panel  3/13 > neg covid/ neg flu    Antimicrobials:  Cefepime  3/13 Flagyl  3/13  vanc 3/13   Interim History / Subjective:  Continued severe diffuse abd pain, fluids going wide open but no pressors or even 02 needed yet, for OR imminently   Objective   Blood pressure (!) 89/67, pulse 89, temperature (!) 97.4 F (36.3 C), temperature source Oral, resp. rate (!) 35, height 5' 9"  (1.753 m), weight 59 kg, SpO2 98 %.        Intake/Output Summary (Last 24 hours) at 01/23/2021 1423 Last data filed at 01/10/2021 1409 Gross per 24 hour  Intake 1000 ml  Output --  Net 1000 ml   Filed Weights   01/24/2021 1053  Weight: 59 kg    Examination: Tmax 97.5 sats 98-100% on RA  General: anxious bm lying on on stretcher holding his abd HEENT :  orophx clear, mucosa dry Lungs: clear to A and P  Cardiovascular: RRR Pulse around 90 Abdomen: mod distended diffusely tender with rebound and guarding  Extremities: cool no edema  Neuro: alert/anxious no motor or cerebellar deficits      I personally reviewed images and agree with radiology impression as follows:  CXR:   3/13 portable No active disease.  Resolved Hospital Problem list      Assessment & Plan:   1) acute abd pain with severe lactic acidosis c/w gi sepsis / ischemic bowel in pt with underlying cirrhosis (very bad combination as liver needs to clear the cytokines and lactate) >>>  agree best time to be aggressive surgically is now so for or immently then back to ICU  2) AKI/AG met acidosis  due to gi sepsis and ACEi use prior /during acute illness  Lab Results  Component Value Date   CREATININE 2.63 (H) 01/30/2021   CREATININE 0.96 06/23/2019   CREATININE 0.86 12/22/2018    >>> probably will need CRT before he's done as presently anuric but K is fine and can rx with hc03 for now given lack of evidence of any volume overload > in meantime continue fluids/ place cvl in OR if possible   3) Severe Leukopenia c/w severe sepsis   4) Mod thrombocytopenia  ? Liver  dz/hypersplenism vs sepsis/early DIC Lab Results  Component Value Date   PLT 78 (L) 01/22/2021   PLT 159 06/23/2019   PLT 93 (L) 12/24/2018  >>> monitor/ transfuse at < 30 or active bleeding  >>> check baseline INR / DIC profile    5) Low albumin baseline likely liver dz vs prot cal malnutrition   5) Likely vent dep post op >>> do 8 cc/kg     Best practice (evaluated daily)  Diet: npo Pain/Anxiety/Delirium protocol (if indicated): fent/versed VAP protocol (if indicated):   DVT prophylaxis: PAS GI prophylaxis: PPI Glucose control: SSI  Mobility: SBR Disposition:to ICU from OR  Code Status: full/ no fm avail (pt in jail prior to admit)  Labs   CBC: Recent Labs  Lab 02/04/2021 1146  WBC 0.8*  NEUTROABS 0.4*  HGB 14.5  HCT 46.8  MCV 103.1*  PLT 78*    Basic Metabolic Panel: Recent Labs  Lab 02/03/2021 1146  NA 134*  K 4.2  CL 97*  CO2 <7*  GLUCOSE 113*  BUN 32*  CREATININE 2.63*  CALCIUM 8.3*   GFR: Estimated Creatinine Clearance: 25.2 mL/min (A) (by C-G formula based on SCr of 2.63 mg/dL (H)). Recent Labs  Lab 01/09/2021 1146  WBC 0.8*  LATICACIDVEN >11.0*    Liver Function Tests: Recent Labs  Lab 01/22/2021 1146  AST 150*  ALT 98*  ALKPHOS 60  BILITOT 2.2*  PROT 7.7  ALBUMIN 2.5*   Recent Labs  Lab 01/07/2021 1146  LIPASE 19   No results for input(s): AMMONIA in the last 168 hours.  ABG    Component Value Date/Time   HCO3 25.5 07/24/2017 0445   TCO2 27 08/04/2017 1206   O2SAT 90.2 07/24/2017 0445     Coagulation Profile: No results for input(s): INR, PROTIME in the last 168 hours.  Cardiac Enzymes: No results for input(s): CKTOTAL, CKMB, CKMBINDEX, TROPONINI in the last 168 hours.  HbA1C: Hgb A1c MFr Bld  Date/Time Value Ref Range Status  01/25/2018 12:13 PM 11.2 (H) 4.6 - 6.5 % Final    Comment:    Glycemic Control Guidelines for People with Diabetes:Non Diabetic:  <6%Goal of Therapy: <7%Additional Action Suggested:  >8%    08/04/2017 11:45 AM 12.8 (H) 4.8 - 5.6 % Final    Comment:    (NOTE) Pre diabetes:          5.7%-6.4% Diabetes:              >6.4% Glycemic control for   <7.0% adults with diabetes     CBG: No results for input(s): GLUCAP in the last 168 hours.     Past Medical History:  He,  has a past medical history of Cirrhosis ([place]), Cirrhosis ([place]), Depression, HCV (hepatitis C virus), Heart murmur, Hypertension, and Type II diabetes mellitus (  [facility]).   Surgical History:   Past Surgical History:  Procedure Laterality Date  . BIOPSY  01/16/2019   Procedure: BIOPSY;  Surgeon: Yetta Flock, MD;  Location: [facility];  Service: Gastroenterology;;  . ESOPHAGEAL BANDING  12/21/2018   Procedure: ESOPHAGEAL BANDING;  Surgeon: Yetta Flock, MD;  Location: [place];  Service: Gastroenterology;;  . ESOPHAGEAL BANDING  01/16/2019   Procedure: ESOPHAGEAL BANDING;  Surgeon: Yetta Flock, MD;  Location: [facility];  Service: Gastroenterology;;  . ESOPHAGOGASTRODUODENOSCOPY (EGD) WITH PROPOFOL N/A 12/21/2018   Procedure: ESOPHAGOGASTRODUODENOSCOPY (EGD) WITH PROPOFOL;  Surgeon: Yetta Flock, MD;  Location: [place];  Service: Gastroenterology;  Laterality: N/A;  . ESOPHAGOGASTRODUODENOSCOPY (EGD) WITH PROPOFOL N/A 01/16/2019   Procedure: ESOPHAGOGASTRODUODENOSCOPY (EGD) WITH PROPOFOL;  Surgeon: Yetta Flock, MD;  Location: [facility];  Service: Gastroenterology;  Laterality: N/A;  . TEE WITHOUT CARDIOVERSION N/A 08/10/2017   Procedure: TRANSESOPHAGEAL ECHOCARDIOGRAM (TEE);  Surgeon: Lelon Perla, MD;  Location: [place];  Service: Cardiovascular;  Laterality: N/A;  . TONSILLECTOMY  1982  . TRANSURETHRAL RESECTION OF PROSTATE N/A 07/26/2017   Procedure: TRANSURETHRAL RESECTION OF THE PROSTATE (TURP) WITH UPROOFING OF PROSTATE ABSCESS;  Surgeon: Irine Seal, MD;  Location: [facility];  Service: Urology;  Laterality: N/A;     Social History:   reports that  he has been smoking cigarettes. He has a 16.00 pack-year smoking history. He has never used smokeless tobacco. He reports current alcohol use of about 6.0 standard drinks of alcohol per week. He reports current drug use. Drug: Marijuana.   Family History:  His family history includes Diabetes in his father and mother; Hyperlipidemia in his father and mother.   Allergies No Known Allergies   Home Medications  Prior to Admission medications   Medication Sig Start Date End Date Taking? Authorizing Provider  lisinopril (ZESTRIL) 20 MG tablet Take 20 mg by mouth daily.   Yes [provider]  metFORMIN (GLUCOPHAGE) 1000 MG tablet Take 1 tablet (1,000 mg total) by mouth 2 (two) times daily. COMPLETE PHYSICAL EXAM WITH LABS REQUIRED FOR ADDITIONAL REFILLS 01/16/19 04/16/19 Yes Armbruster, Carlota Raspberry, MD  glipiZIDE (GLUCOTROL) 10 MG tablet Take 1 tablet (10 mg total) by mouth 2 (two) times daily before a meal. Patient not taking: Reported on 01/18/2021 02/01/18   Jearld Fenton, NP  propranolol (INDERAL) 10 MG tablet Take 1 tablet (10 mg total) by mouth 2 (two) times daily. Patient not taking: Reported on 01/23/2021 01/13/19   Yetta Flock, MD       The patient is critically ill with multiple organ systems failure and requires high complexity decision making for assessment and support, frequent evaluation and titration of therapies, application of advanced monitoring technologies and extensive interpretation of multiple databases. Critical Care Time devoted to patient care services described in this note is 45 minutes.    Christinia Gully, MD Pulmonary and [place] [phone]   After 7:00 pm call Elink  [phone]

## 2021-01-19 NOTE — H&P (Signed)
Reason for Consult:Abdominal pain Referring Physician: Long MD   Cameron Coffey is an 60 y.o. male.  HPI: Asked to see patient at the request of Dr. Jacqulyn BathLong due to abdominal pain.  The patient is a prisoner in a local jail.  He reports a 4-day history of diffuse abdominal pain.  The pain is worsened every day.  Has been constant.  He has had nausea but no vomiting.  History of hepatitis C and cirrhosis with varices and function [place] [facility] with child C it looks like.  He has had no significant issues with ascites though.  He was evaluate this morning with a CT scan without contrast due to a creatinine of 2.6 which is starkly elevated compared to the last time he was in the hospital.  He also has a white count of 0.8 and a lactate of 11.  The bowel looks thickening but there is no contrast.  He has diffuse abdominal pain which is severe.  He cannot get comfortable.  He has had no treatment for his hepatitis C and drinks as well but he has been incarcerated the last few months.  A guard is with him.      Past Medical History:  Diagnosis Date  . Cirrhosis (HCC)   . Cirrhosis (HCC)   . Depression   . HCV (hepatitis C virus)   . Heart murmur    "born w/one"  . Hypertension   . Type II diabetes mellitus (HCC)          Past Surgical History:  Procedure Laterality Date  . BIOPSY  01/16/2019   Procedure: BIOPSY;  Surgeon: Benancio DeedsArmbruster, Steven P, MD;  [facility];  Service: Gastroenterology;;  . ESOPHAGEAL BANDING  12/21/2018   Procedure: ESOPHAGEAL BANDING;  Surgeon: Benancio DeedsArmbruster, Steven P, MD;  [facility];  Service: Gastroenterology;;  . ESOPHAGEAL BANDING  01/16/2019   Procedure: ESOPHAGEAL BANDING;  Surgeon: Benancio DeedsArmbruster, Steven P, MD;  [facility]: [facility];  Service: Gastroenterology;;  . ESOPHAGOGASTRODUODENOSCOPY (EGD) WITH PROPOFOL N/A 12/21/2018   Procedure: ESOPHAGOGASTRODUODENOSCOPY (EGD) WITH PROPOFOL;  Surgeon: Benancio DeedsArmbruster, Steven P, MD;  [facility];  Service: Gastroenterology;  Laterality: N/A;  . ESOPHAGOGASTRODUODENOSCOPY (EGD) WITH PROPOFOL N/A 01/16/2019   Procedure: ESOPHAGOGASTRODUODENOSCOPY (EGD) WITH PROPOFOL;  Surgeon: Benancio DeedsArmbruster, Steven P, MD;  [facility];  Service: Gastroenterology;  Laterality: N/A;  . TEE WITHOUT CARDIOVERSION N/A 08/10/2017   Procedure: TRANSESOPHAGEAL ECHOCARDIOGRAM (TEE);  Surgeon: Lewayne Buntingrenshaw, Brian S, MD;  [facility];  Service: Cardiovascular;  Laterality: N/A;  . TONSILLECTOMY  1982  . TRANSURETHRAL RESECTION OF PROSTATE N/A 07/26/2017   Procedure: TRANSURETHRAL RESECTION OF THE PROSTATE (TURP) WITH UPROOFING OF PROSTATE ABSCESS;  Surgeon: Bjorn PippinWrenn, John, MD;  Location: [facility] ORS;  Service: Urology;  Laterality: N/A;         Family History  Problem Relation Age of Onset  . Hyperlipidemia Mother   . Diabetes Mother   . Hyperlipidemia Father   . Diabetes Father     Social History:  reports that he has been smoking cigarettes. He has a 16.00 pack-year smoking history. He has never used smokeless tobacco. He reports current alcohol use of about 6.0 standard drinks of alcohol per week. He reports current drug use. Drug: Marijuana.  Allergies: No Known Allergies  Medications: I have reviewed the patient's current medications.  Lab Results Last 48 Hours        Results for orders placed or performed during the hospital encounter of 01/26/2021 (from the past 48  hour(s))  Comprehensive metabolic panel     Status: Abnormal   Collection Time: 2021-01-24 11:46 AM  Result Value Ref Range   Sodium 134 (L) 135 - 145 mmol/L   Potassium 4.2 3.5 - 5.1 mmol/L   Chloride 97 (L) 98 - 111 mmol/L   CO2 <7 (L) 22 - 32 mmol/L   Glucose, Bld 113 (H) 70 - 99 mg/dL    Comment: Glucose reference range applies only to samples taken after fasting for at least 8 hours.   BUN 32 (H) 6 - 20 mg/dL   Creatinine, Ser 7.82 (H) 0.61 - 1.24 mg/dL   Calcium 8.3 (L) 8.9 - 10.3  mg/dL   Total Protein 7.7 6.5 - 8.1 g/dL   Albumin 2.5 (L) 3.5 - 5.0 g/dL   AST 956 (H) 15 - 41 U/L   ALT 98 (H) 0 - 44 U/L    Comment: RESULTS CONFIRMED BY MANUAL DILUTION   Alkaline Phosphatase 60 38 - 126 U/L   Total Bilirubin 2.2 (H) 0.3 - 1.2 mg/dL   GFR, Estimated 27 (L) >60 mL/min    Comment: (NOTE) Calculated using the CKD-EPI Creatinine Equation (2021)    Anion gap NOT CALCULATED 5 - 15    Comment: Performed at [facility], [address], [place], [place] [address]  Lipase, blood     Status: None   Collection Time: 24-Jan-2021 11:46 AM  Result Value Ref Range   Lipase 19 11 - 51 U/L    Comment: Performed at [facility], [address], [place], [place] [address]  CBC with Differential     Status: Abnormal   Collection Time: 2021/01/24 11:46 AM  Result Value Ref Range   WBC 0.8 (LL) 4.0 - 10.5 K/uL    Comment: REPEATED TO VERIFY WHITE COUNT CONFIRMED ON SMEAR THIS CRITICAL RESULT HAS VERIFIED AND BEEN CALLED TO G,WILSON BY AISHA MOHAMED ON 03 13 2022 AT 1234, AND HAS BEEN READ BACK.     RBC 4.54 4.22 - 5.81 MIL/uL   Hemoglobin 14.5 13.0 - 17.0 g/dL   HCT 69.6 29.5 - 28.4 %   MCV 103.1 (H) 80.0 - 100.0 fL   MCH 31.9 26.0 - 34.0 pg   MCHC 31.0 30.0 - 36.0 g/dL   RDW 13.2 44.0 - 10.2 %   Platelets 78 (L) 150 - 400 K/uL    Comment: SPECIMEN CHECKED FOR CLOTS Immature Platelet Fraction may be clinically indicated, consider ordering this additional test [id number] REPEATED TO VERIFY PLATELET COUNT CONFIRMED BY SMEAR    nRBC 0.0 0.0 - 0.2 %   Neutrophils Relative % 54 %   Neutro Abs 0.4 (LL) 1.7 - 7.7 K/uL    Comment: This critical result has verified and been called to G,WILSON by Lucia Estelle on 24-Jan-2021 at 1234, and has been read back.    Lymphocytes Relative 37 %   Lymphs Abs 0.3 (L) 0.7 - 4.0 K/uL   Monocytes Relative 9 %   Monocytes Absolute 0.1 0.1 - 1.0 K/uL   Eosinophils  Relative 0 %   Eosinophils Absolute 0.0 0.0 - 0.5 K/uL   Basophils Relative 0 %   Basophils Absolute 0.0 0.0 - 0.1 K/uL   WBC Morphology MILD LEFT SHIFT (1-5% METAS, OCC MYELO, OCC BANDS)    RBC Morphology Acanthocytes present     Comment: POLYCHROMASIA PRESENT   Immature Granulocytes 0 %   Abs Immature Granulocytes 0.00 0.00 - 0.07 K/uL   Ines Bloomer  Cells PRESENT    Polychromasia PRESENT     Comment: Performed at [facility], [address] [place], [place] [address]  Lactic acid, plasma     Status: Abnormal   Collection Time: 2021/02/14 11:46 AM  Result Value Ref Range   Lactic Acid, Venous >11.0 (HH) 0.5 - 1.9 mmol/L    Comment: CRITICAL RESULT CALLED TO, READ BACK BY AND VERIFIED WITH: WILSON,G RN @1236  ON February 14, 2021 JACKSON,K Performed at [facility], [address], [place], [place]        Imaging Results (Last 48 hours)  CT ABDOMEN PELVIS WO CONTRAST  Result Date: 2021-02-14 CLINICAL DATA:  Abdominal pain elevated lactic facet. Acute renal failure. EXAM: CT ABDOMEN AND PELVIS WITHOUT CONTRAST TECHNIQUE: Multidetector CT imaging of the abdomen and pelvis was performed following the standard protocol without IV contrast. COMPARISON:  CT scan 08/11/2017 FINDINGS: Lower chest: The lung bases are clear of acute process. No pleural effusion or pulmonary lesions. The heart is normal in size. No pericardial effusion. The distal esophagus and aorta are unremarkable. Hepatobiliary: Stable cirrhotic changes involving the liver. No obvious hepatic lesions without contrast. No intrahepatic biliary dilatation. Gallstones are noted the gallbladder. No common bile duct dilatation. Pancreas: No mass, inflammation or ductal dilatation. Spleen: Normal size.  No focal lesions. Adrenals/Urinary Tract: Adrenal glands and kidneys are unremarkable except for bilateral renal calculi. No obstructing ureteral calculi or hydroureteronephrosis.  The bladder is decompressed by Foley catheter but does contain high attenuation material which could be hemorrhage/clot. Stomach/Bowel: The stomach, duodenum, small bowel and colon demonstrate diffuse wall thickening. Although this could be enterocolitis it also may be due to the patient's cirrhosis and low albumin. No findings for obstruction or perforation. Vascular/Lymphatic: Moderate atherosclerotic calcifications involving the aorta and iliac arteries and branch vessels but no aneurysm. No mesenteric or retroperitoneal mass or adenopathy. Upper abdominal lymph nodes typical with cirrhosis. Reproductive: The prostate gland and seminal vesicles are unremarkable. Other: Mesenteric edema and a small amount of free abdominal and pelvic fluid likely related to the patient's cirrhosis. No free air. Musculoskeletal: No significant bony findings. IMPRESSION: 1. Stable cirrhotic changes involving the liver. No obvious hepatic lesions without contrast. 2. Cholelithiasis. 3. Bilateral renal calculi but no obstructing ureteral calculi or hydroureteronephrosis. 4. Diffuse bowel wall thickening could be due to the patient's cirrhosis and low albumin. 5. High attenuation material in the bladder could be hemorrhage/clot. Aortic Atherosclerosis (ICD10-I70.0). Electronically Signed   By: 10/11/2017 M.D.   On: 14-Feb-2021 13:25   DG Chest Port 1 View  Result Date: 02/14/21 CLINICAL DATA:  Sepsis, abdominal pain EXAM: PORTABLE CHEST 1 VIEW COMPARISON:  08/04/2017 FINDINGS: The heart size and mediastinal contours are within normal limits. Scattered atherosclerotic calcification of the aortic knob. No focal airspace consolidation, pleural effusion, or pneumothorax. The visualized skeletal structures are unremarkable. IMPRESSION: No active disease. Electronically Signed   By: 08/06/2017 D.O.   On: 02/14/21 13:16     Review of Systems  Gastrointestinal: Positive for abdominal pain.  All other systems reviewed  and are negative.  Blood pressure (!) 89/67, pulse 89, temperature (!) 97.4 F (36.3 C), temperature source Oral, resp. rate (!) 35, height 5\' 9"  (1.753 m), weight 59 kg, SpO2 98 %. Physical Exam Constitutional:      Appearance: He is ill-appearing and toxic-appearing.  HENT:     Head: Normocephalic and atraumatic.     Mouth/Throat:     Mouth: Mucous membranes are  moist.  Eyes:     General: No scleral icterus.    Pupils: Pupils are equal, round, and reactive to light.  Cardiovascular:     Rate and Rhythm: Regular rhythm. Tachycardia present.  Pulmonary:     Effort: Pulmonary effort is normal.     Breath sounds: Normal breath sounds.  Abdominal:     General: There is distension.     Tenderness: There is abdominal tenderness. There is guarding and rebound.  Musculoskeletal:        General: Normal range of motion.     Cervical back: Normal range of motion and neck supple.  Neurological:     General: No focal deficit present.     Mental Status: He is alert.  Psychiatric:        Mood and Affect: Mood normal.        Behavior: Behavior normal.     Assessment/Plan: Abdominal pain with concern for mesenteric ischemia with small bowel necrosis given lactate of 11 and acute renal failure on top of this-recommend emergent laparoscopy possible laparotomy.  Explained that he may die from this.  High operative risk given cirrhosis with hepatitis C with history of varices.  Mortality from the ACS calculator given risk factors anywhere from 10 to 60%.  Risk of overall complication 30 to 60%, risk of progressive renal failure 20%, risk of wound complication 30%, risk of GI bleed 10 to 30%, possible ostomy, possible open abdomen, possible need for reoperation, DVT, myocardial infarction, stroke, and progression of sepsis.  I recommend critical care medicine manage patient due to severe multisystem organ failure and need for ICU care postoperative.  Significant possibility of diffuse small bowel  ischemia with death with nothing to offer surgically.  I discussed this with the patient.  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  Proceed emergently to the operating room.  Consult CCM for assistance in managing sepsis and pending pulmonary failure after surgery.  Mesa Janus A Marzetta Lanza 01/29/21, 2:22 PM

## 2021-01-19 NOTE — ED Notes (Addendum)
LA > 11.0  Long, MD notified

## 2021-01-19 NOTE — Anesthesia Procedure Notes (Signed)
Arterial Line Insertion Start/End2022-03-30 3:28 PM, 2021/02/05 3:33 PM Performed by: Val Eagle, MD, anesthesiologist  Patient location: OR. Preanesthetic checklist: patient identified, IV checked, site marked, risks and benefits discussed, surgical consent, monitors and equipment checked, pre-op evaluation, timeout performed and anesthesia consent Left, radial was placed Catheter size: 20 G Hand hygiene performed  and maximum sterile barriers used   Attempts: 1 Procedure performed without using ultrasound guided technique. Following insertion, dressing applied and Biopatch. Post procedure assessment: normal and unchanged  Patient tolerated the procedure well with no immediate complications.

## 2021-01-19 NOTE — Transfer of Care (Signed)
Immediate Anesthesia Transfer of Care Note  Patient: Cameron Coffey  Procedure(s) Performed: DIAGNOSTIC LAPAROSCOPY; LAPAROTOMY WITH RIGHT PARTIAL COLECTOMY; ABTHERA WOUND VAC PLACEMENT (N/A Abdomen)  Patient Location: ICU  Anesthesia Type:General  Level of Consciousness: unresponsive  Airway & Oxygen Therapy: Patient remains intubated per anesthesia plan and Patient placed on Ventilator (see vital sign flow sheet for setting)  Post-op Assessment: Report given to RN and Post -op Vital signs reviewed and stable  Post vital signs: Reviewed and stable  Last Vitals:  Vitals Value Taken Time  BP    Temp    Pulse    Resp    SpO2      Last Pain:  Vitals:   02-16-2021 1400  TempSrc:   PainSc: 8          Complications: No complications documented.

## 2021-01-19 NOTE — Consult Note (Addendum)
Renal Service Consult Note [facility]  Cameron Coffey 01/29/2021 Cameron Blazing, MD Requesting Physician: Cameron Coffey  Reason for Consult: AKI, severe metabolic acidosis HPI: The patient is a 60 y.o. year-old w/ hx of DM2, HTN, Hep C, depression, cirrhosis who is a prisoner in a local jail. Pt brought to hospital for abdominal pain for last 4 days. +nausea. Hx of hep C , cirrhosis w/ varices. Abd CT was done w/o contrast w/ some bowel thickening but wo contrast. Pt had diffuse abd pain which was severe. Seen by gen surgery who suspected mesenteric ischemia w/ bowel necrosis. Pt was taken to OR emergently which showed mesenteric ischemia w/ necrotic R colon w/ perforation. He underwent partial colectomy w/ placement of vac dressing. Pt had severe bowel swelling w/ fibrinous exudate throughout the bowerl. The R colon appeared the most necrotic. SB had some dusky areas but did not need resection. The R colon was resected. Then was taken to the ICU on vent support. ABG showed pH 6.99, pt got several amps of IV NaHCO3 w/ repeat ABG imrpoved.  Creat is up at 2.3. Asked to see for CRRT.  Pt seen in ICU.  No hx obtained.    ROS n/a  Past Medical History  Past Medical History:  Diagnosis Date  . Cirrhosis ([place])   . Cirrhosis ([place])   . Depression   . HCV (hepatitis C virus)   . Heart murmur    "born w/one"  . Hypertension   . Type II diabetes mellitus ([place])    Past Surgical History  Past Surgical History:  Procedure Laterality Date  . BIOPSY  01/16/2019   Procedure: BIOPSY;  Surgeon: Cameron Flock, MD;  Location: [facility];  Service: Gastroenterology;;  . ESOPHAGEAL BANDING  12/21/2018   Procedure: ESOPHAGEAL BANDING;  Surgeon: Cameron Flock, MD;  Location: [place];  Service: Gastroenterology;;  . ESOPHAGEAL BANDING  01/16/2019   Procedure: ESOPHAGEAL BANDING;  Surgeon: Cameron Flock, MD;  [facility]: [facility];  Service: Gastroenterology;;  .  ESOPHAGOGASTRODUODENOSCOPY (EGD) WITH PROPOFOL N/A 12/21/2018   Procedure: ESOPHAGOGASTRODUODENOSCOPY (EGD) WITH PROPOFOL;  Surgeon: Cameron Flock, MD;  Location: [place];  Service: Gastroenterology;  Laterality: N/A;  . ESOPHAGOGASTRODUODENOSCOPY (EGD) WITH PROPOFOL N/A 01/16/2019   Procedure: ESOPHAGOGASTRODUODENOSCOPY (EGD) WITH PROPOFOL;  Surgeon: Cameron Flock, MD;  [facility];  Service: Gastroenterology;  Laterality: N/A;  . TEE WITHOUT CARDIOVERSION N/A 08/10/2017   Procedure: TRANSESOPHAGEAL ECHOCARDIOGRAM (TEE);  Surgeon: Cameron Perla, MD;  Location: [place];  Service: Cardiovascular;  Laterality: N/A;  . TONSILLECTOMY  1982  . TRANSURETHRAL RESECTION OF PROSTATE N/A 07/26/2017   Procedure: TRANSURETHRAL RESECTION OF THE PROSTATE (TURP) WITH UPROOFING OF PROSTATE ABSCESS;  Surgeon: Cameron Seal, MD;  Location: [facility] ORS;  Service: Urology;  Laterality: N/A;   Family History  Family History  Problem Relation Age of Onset  . Hyperlipidemia Mother   . Diabetes Mother   . Hyperlipidemia Father   . Diabetes Father    Social History  reports that he has been smoking cigarettes. He has a 16.00 pack-year smoking history. He has never used smokeless tobacco. He reports current alcohol use of about 6.0 standard drinks of alcohol per week. He reports current drug use. Drug: Marijuana. Allergies No Known Allergies Home medications Prior to Admission medications   Medication Sig Start Date End Date Taking? Authorizing Provider  lisinopril (ZESTRIL) 20 MG tablet Take 20 mg by mouth daily.   Yes [provider]  metFORMIN (GLUCOPHAGE) 1000 MG tablet Take 1 tablet (1,000 mg total) by mouth 2 (two) times daily. COMPLETE PHYSICAL EXAM WITH LABS REQUIRED FOR ADDITIONAL REFILLS 01/16/19 04/16/19 Yes Cameron Coffey, Cameron Raspberry, MD  glipiZIDE (GLUCOTROL) 10 MG tablet Take 1 tablet (10 mg total) by mouth 2 (two) times daily before a meal. Patient not taking: Reported on  02/01/2021 02/01/18   Cameron Fenton, NP  propranolol (INDERAL) 10 MG tablet Take 1 tablet (10 mg total) by mouth 2 (two) times daily. Patient not taking: Reported on 01/07/2021 01/13/19   Cameron Flock, MD     Vitals:   01/23/2021 1520 01/12/2021 1700 02/05/2021 1800 01/17/2021 1805  BP: (!) 79/50     Pulse: 86     Resp: (!) 33  16 16  Temp:   (!) 90.8 F (32.7 C)   TempSrc:   Rectal   SpO2: 99% 91% 100%   Weight:      Height:       Exam Gen on vent, sedated No rash, cyanosis or gangrene Sclera anicteric, throat clear  No jvd or bruits Chest clear anterior/ lateral RRR no MRG Abd not able to examine GU foley in place, min UOP MS no joint effusions or deformity Ext trace LE edema, no wounds or ulcers Neuro is on vent, sedated   Home meds:  - metformin 1 gm bid/ glipizide 10 bid  - inderal 10 bid/ lisinopril 20 qd  - prn's/ vitamins/ supplements    UA - pending   CXR 3/13 -  IMPRESSION: No active disease.    CT abd / pelvis wo contrast - IMPRESSION: Stable cirrhotic changes involving the liver. No obvious hepatic lesions without contrast. Cholelithiasis. Bilateral renal calculi but no obstructing ureteral calculi or hydroureteronephrosis. Diffuse bowel wall thickening could be due to the patient's cirrhosis and low albumin. High attenuation material in the bladder could be hemorrhage/clot.    ABG 6.99/ 42/ 422   Na 143  K 4.0  CO2 <7  BUN 39 Creat 2.20  Alb 2.5  Ast 150/ Alt 98  Tbili 2.2  eGFR 27  Lactic acid > 11  WBC 0.8   Hb 14.5, 10.2    plt 78K          Assessment/ Plan: 1. AKI - due to septic shock related to ischemic bowel. Severe metabolic acidosis. AKI due to septic shock, Cr 2.2 on admit. Baseline creat in 2020 was 0.96. Not making urine here. UA pending. Abd CT w/o hydro, normal appearing kidneys.  Plan CRRT w/ bicarb gtt pre and post and usual 4/2.5 dialysate. No heparin to start w/ for now. Keep even. Get ABG q 3-4 hrs and adjust fluids when pH > 7.35. Will  follow.  2. Metabolic acidosis - severe due to sepsis, poor hemoperfusion of tissues 3. Septic shock - on pressor support; per CCM 4. Leukopenia - due to severe sepsis most likely 5. Mesenteric ischemia/ perforated necrotic R colon - sp R partial colectomy today 3/13.  6. Cirrhosis 7. Hx of hep C 8. Low albumin - likely due to underlying cirrhosis 9. VDRF - per CCM      Rob Schertz  MD 01/07/2021, 8:40 PM  Recent Labs  Lab 01/18/2021 1146 02/06/2021 1510 01/29/2021 1539 01/31/2021 1604  WBC 0.8*  --   --   --   HGB 14.5   < > 11.6* 10.2*   < > = values in this interval not displayed.   Recent Labs  Lab 01/15/2021  1146 01/24/2021 1510 01/12/2021 1539 01/18/2021 1604  K 4.2   < > 3.5 4.0  BUN 32*  --   --  39*  CREATININE 2.63*  --   --  2.20*  CALCIUM 8.3*  --   --   --    < > = values in this interval not displayed.

## 2021-01-19 NOTE — Progress Notes (Addendum)
Pharmacy Antibiotic Note  Cameron Coffey is a 60 y.o. male admitted on 01/27/2021 with mesenteric ischemia with necrotic right colon and perforation s/p ex-lap with partial colectomy.  Pharmacy has been consulted for Zosyn dosing.  Plan: Zosyn 3.375gm IV q8h (4hr extended infusions) Follow up renal function & cultures   Height: 5\' 9"  (175.3 cm) Weight: 59 kg (130 lb) IBW/kg (Calculated) : 70.7  Temp (24hrs), Avg:97.5 F (36.4 C), Min:97.4 F (36.3 C), Max:97.5 F (36.4 C)  Recent Labs  Lab 01/07/2021 1146 01/28/2021 1345 02/05/2021 1604  WBC 0.8*  --   --   CREATININE 2.63*  --  2.20*  LATICACIDVEN >11.0* >11.0*  --     Estimated Creatinine Clearance: 30.2 mL/min (A) (by C-G formula based on SCr of 2.2 mg/dL (H)).    No Known Allergies  Antimicrobials this admission: 3/13 Vanc/Cefepime/Flagyl x 1 3/13 Zosyn >>  Dose adjustments this admission:  Microbiology results: 3/13 BCx: 3/13 MRSA PCR: sent  Thank you for allowing pharmacy to be a part of this patient's care.  4/13, PharmD, BCPS Pharmacy: [phone] 01/08/2021 7:00 PM    Addendum:  - with the start of CRRT, will adjust Zosyn to 3.375g IV q6h (30 minute infusions.  01/14/2021 9:08 PM

## 2021-01-19 NOTE — OR Nursing (Signed)
Surgical closing counts incorrect.  One 18x18 xray detectable sponge intentionally retained in patient's abdoment by Dr. Warren Lacy.

## 2021-01-19 NOTE — Progress Notes (Signed)
A consult was received from an ED physician for cefepime and vancomycin per pharmacy dosing.  The patient's profile has been reviewed for ht/wt/allergies/indication/available labs.   A one time order has been placed for cefepime 2 g IV and vancomycin 1 g IV by provider which I agree with.  Further antibiotics/pharmacy consults should be ordered by admitting physician if indicated.                       Thank you, Royce Macadamia, PharmD, BCPS 01/31/2021  12:49 PM

## 2021-01-19 NOTE — Consult Note (Signed)
Reason for Consult:Abdominal pain Referring Physician: Long MD   Cameron Coffey is an 60 y.o. male.  HPI: Asked to see patient at the request of Dr. Jacqulyn Bath due to abdominal pain.  The patient is a prisoner in a local jail.  He reports a 4-day history of diffuse abdominal pain.  The pain is worsened every day.  Has been constant.  He has had nausea but no vomiting.  History of hepatitis C and cirrhosis with varices and function around [facility] with child C it looks like.  He has had no significant issues with ascites though.  He was evaluate this morning with a CT scan without contrast due to a creatinine of 2.6 which is starkly elevated compared to the last time he was in the hospital.  He also has a white count of 0.8 and a lactate of 11.  The bowel looks thickening but there is no contrast.  He has diffuse abdominal pain which is severe.  He cannot get comfortable.  He has had no treatment for his hepatitis C and drinks as well but he has been incarcerated the last few months.  A guard is with him.  Past Medical History:  Diagnosis Date  . Cirrhosis (HCC)   . Cirrhosis (HCC)   . Depression   . HCV (hepatitis C virus)   . Heart murmur    "born w/one"  . Hypertension   . Type II diabetes mellitus (HCC)     Past Surgical History:  Procedure Laterality Date  . BIOPSY  01/16/2019   Procedure: BIOPSY;  Surgeon: Benancio Deeds, MD;  Location: [facility] ENDOSCOPY;  Service: Gastroenterology;;  . ESOPHAGEAL BANDING  12/21/2018   Procedure: ESOPHAGEAL BANDING;  Surgeon: Benancio Deeds, MD;  Location: [facility] ENDOSCOPY;  Service: Gastroenterology;;  . ESOPHAGEAL BANDING  01/16/2019   Procedure: ESOPHAGEAL BANDING;  Surgeon: Benancio Deeds, MD;  Location: [facility] ENDOSCOPY;  Service: Gastroenterology;;  . ESOPHAGOGASTRODUODENOSCOPY (EGD) WITH PROPOFOL N/A 12/21/2018   Procedure: ESOPHAGOGASTRODUODENOSCOPY (EGD) WITH PROPOFOL;  Surgeon: Benancio Deeds, MD;  Location: [facility] ENDOSCOPY;  Service:  Gastroenterology;  Laterality: N/A;  . ESOPHAGOGASTRODUODENOSCOPY (EGD) WITH PROPOFOL N/A 01/16/2019   Procedure: ESOPHAGOGASTRODUODENOSCOPY (EGD) WITH PROPOFOL;  Surgeon: Benancio Deeds, MD;  Location: [facility] ENDOSCOPY;  Service: Gastroenterology;  Laterality: N/A;  . TEE WITHOUT CARDIOVERSION N/A 08/10/2017   Procedure: TRANSESOPHAGEAL ECHOCARDIOGRAM (TEE);  Surgeon: Lewayne Bunting, MD;  Location: [facility] ENDOSCOPY;  Service: Cardiovascular;  Laterality: N/A;  . TONSILLECTOMY  1982  . TRANSURETHRAL RESECTION OF PROSTATE N/A 07/26/2017   Procedure: TRANSURETHRAL RESECTION OF THE PROSTATE (TURP) WITH UPROOFING OF PROSTATE ABSCESS;  Surgeon: Bjorn Pippin, MD;  Location: [facility] ORS;  Service: Urology;  Laterality: N/A;    Family History  Problem Relation Age of Onset  . Hyperlipidemia Mother   . Diabetes Mother   . Hyperlipidemia Father   . Diabetes Father     Social History:  reports that he has been smoking cigarettes. He has a 16.00 pack-year smoking history. He has never used smokeless tobacco. He reports current alcohol use of about 6.0 standard drinks of alcohol per week. He reports current drug use. Drug: Marijuana.  Allergies: No Known Allergies  Medications: I have reviewed the patient's current medications.  Results for orders placed or performed during the hospital encounter of 01/08/2021 (from the past 48 hour(s))  Comprehensive metabolic panel     Status: Abnormal   Collection Time: 01/25/2021 11:46 AM  Result Value Ref Range   Sodium  134 (L) 135 - 145 mmol/L   Potassium 4.2 3.5 - 5.1 mmol/L   Chloride 97 (L) 98 - 111 mmol/L   CO2 <7 (L) 22 - 32 mmol/L   Glucose, Bld 113 (H) 70 - 99 mg/dL    Comment: Glucose reference range applies only to samples taken after fasting for at least 8 hours.   BUN 32 (H) 6 - 20 mg/dL   Creatinine, Ser 6.59 (H) 0.61 - 1.24 mg/dL   Calcium 8.3 (L) 8.9 - 10.3 mg/dL   Total Protein 7.7 6.5 - 8.1 g/dL   Albumin 2.5 (L) 3.5 - 5.0 g/dL   AST 935 (H) 15 -  41 U/L   ALT 98 (H) 0 - 44 U/L    Comment: RESULTS CONFIRMED BY MANUAL DILUTION   Alkaline Phosphatase 60 38 - 126 U/L   Total Bilirubin 2.2 (H) 0.3 - 1.2 mg/dL   GFR, Estimated 27 (L) >60 mL/min    Comment: (NOTE) Calculated using the CKD-EPI Creatinine Equation (2021)    Anion gap NOT CALCULATED 5 - 15    Comment: Performed at [facility], [address] [place], [place] [address]  Lipase, blood     Status: None   Collection Time: 01/28/2021 11:46 AM  Result Value Ref Range   Lipase 19 11 - 51 U/L    Comment: Performed at [facility], [address] [place], [place] [address]  CBC with Differential     Status: Abnormal   Collection Time: 02/05/2021 11:46 AM  Result Value Ref Range   WBC 0.8 (LL) 4.0 - 10.5 K/uL    Comment: REPEATED TO VERIFY WHITE COUNT CONFIRMED ON SMEAR THIS CRITICAL RESULT HAS VERIFIED AND BEEN CALLED TO G,WILSON BY AISHA MOHAMED ON 03 13 2022 AT 1234, AND HAS BEEN READ BACK.     RBC 4.54 4.22 - 5.81 MIL/uL   Hemoglobin 14.5 13.0 - 17.0 g/dL   HCT 00.9 23.3 - 00.7 %   MCV 103.1 (H) 80.0 - 100.0 fL   MCH 31.9 26.0 - 34.0 pg   MCHC 31.0 30.0 - 36.0 g/dL   RDW 62.2 63.3 - 35.4 %   Platelets 78 (L) 150 - 400 K/uL    Comment: SPECIMEN CHECKED FOR CLOTS Immature Platelet Fraction may be clinically indicated, consider ordering this additional test [id number] REPEATED TO VERIFY PLATELET COUNT CONFIRMED BY SMEAR    nRBC 0.0 0.0 - 0.2 %   Neutrophils Relative % 54 %   Neutro Abs 0.4 (LL) 1.7 - 7.7 K/uL    Comment: This critical result has verified and been called to G,WILSON by Lucia Estelle on 03 13 2022 at 1234, and has been read back.    Lymphocytes Relative 37 %   Lymphs Abs 0.3 (L) 0.7 - 4.0 K/uL   Monocytes Relative 9 %   Monocytes Absolute 0.1 0.1 - 1.0 K/uL   Eosinophils Relative 0 %   Eosinophils Absolute 0.0 0.0 - 0.5 K/uL   Basophils Relative 0 %   Basophils Absolute 0.0 0.0 - 0.1 K/uL   WBC Morphology MILD  LEFT SHIFT (1-5% METAS, OCC MYELO, OCC BANDS)    RBC Morphology Acanthocytes present     Comment: POLYCHROMASIA PRESENT   Immature Granulocytes 0 %   Abs Immature Granulocytes 0.00 0.00 - 0.07 K/uL   Burr Cells PRESENT    Polychromasia PRESENT     Comment: Performed at [facility], [address] [place], [place] [address]  Lactic acid, plasma     Status: Abnormal   Collection Time: 01/07/2021 11:46 AM  Result Value Ref Range   Lactic Acid, Venous >11.0 (HH) 0.5 - 1.9 mmol/L    Comment: CRITICAL RESULT CALLED TO, READ BACK BY AND VERIFIED WITH: WILSON,G RN @1236  ON 01/25/2021 JACKSON,K Performed at [facility], [address], [place], [place]     CT ABDOMEN PELVIS WO CONTRAST  Result Date: 01/10/2021 CLINICAL DATA:  Abdominal pain elevated lactic facet. Acute renal failure. EXAM: CT ABDOMEN AND PELVIS WITHOUT CONTRAST TECHNIQUE: Multidetector CT imaging of the abdomen and pelvis was performed following the standard protocol without IV contrast. COMPARISON:  CT scan 08/11/2017 FINDINGS: Lower chest: The lung bases are clear of acute process. No pleural effusion or pulmonary lesions. The heart is normal in size. No pericardial effusion. The distal esophagus and aorta are unremarkable. Hepatobiliary: Stable cirrhotic changes involving the liver. No obvious hepatic lesions without contrast. No intrahepatic biliary dilatation. Gallstones are noted the gallbladder. No common bile duct dilatation. Pancreas: No mass, inflammation or ductal dilatation. Spleen: Normal size.  No focal lesions. Adrenals/Urinary Tract: Adrenal glands and kidneys are unremarkable except for bilateral renal calculi. No obstructing ureteral calculi or hydroureteronephrosis. The bladder is decompressed by Foley catheter but does contain high attenuation material which could be hemorrhage/clot. Stomach/Bowel: The stomach, duodenum, small bowel and colon demonstrate diffuse wall  thickening. Although this could be enterocolitis it also may be due to the patient's cirrhosis and low albumin. No findings for obstruction or perforation. Vascular/Lymphatic: Moderate atherosclerotic calcifications involving the aorta and iliac arteries and branch vessels but no aneurysm. No mesenteric or retroperitoneal mass or adenopathy. Upper abdominal lymph nodes typical with cirrhosis. Reproductive: The prostate gland and seminal vesicles are unremarkable. Other: Mesenteric edema and a small amount of free abdominal and pelvic fluid likely related to the patient's cirrhosis. No free air. Musculoskeletal: No significant bony findings. IMPRESSION: 1. Stable cirrhotic changes involving the liver. No obvious hepatic lesions without contrast. 2. Cholelithiasis. 3. Bilateral renal calculi but no obstructing ureteral calculi or hydroureteronephrosis. 4. Diffuse bowel wall thickening could be due to the patient's cirrhosis and low albumin. 5. High attenuation material in the bladder could be hemorrhage/clot. Aortic Atherosclerosis (ICD10-I70.0). Electronically Signed   By: 10/11/2017 M.D.   On: 02/02/2021 13:25   DG Chest Port 1 View  Result Date: 01/15/2021 CLINICAL DATA:  Sepsis, abdominal pain EXAM: PORTABLE CHEST 1 VIEW COMPARISON:  08/04/2017 FINDINGS: The heart size and mediastinal contours are within normal limits. Scattered atherosclerotic calcification of the aortic knob. No focal airspace consolidation, pleural effusion, or pneumothorax. The visualized skeletal structures are unremarkable. IMPRESSION: No active disease. Electronically Signed   By: 08/06/2017 D.O.   On: 01/18/2021 13:16    Review of Systems  Gastrointestinal: Positive for abdominal pain.  All other systems reviewed and are negative.  Blood pressure (!) 89/67, pulse 89, temperature (!) 97.4 F (36.3 C), temperature source Oral, resp. rate (!) 35, height 5\' 9"  (1.753 m), weight 59 kg, SpO2 98 %. Physical  Exam Constitutional:      Appearance: He is ill-appearing and toxic-appearing.  HENT:     Head: Normocephalic and atraumatic.     Mouth/Throat:     Mouth: Mucous membranes are moist.  Eyes:     General: No scleral icterus.    Pupils: Pupils are equal, round, and reactive to light.  Cardiovascular:     Rate and Rhythm: Regular rhythm. Tachycardia present.  Pulmonary:     Effort: Pulmonary effort is normal.     Breath sounds: Normal breath sounds.  Abdominal:     General: There is distension.     Tenderness: There is abdominal tenderness. There is guarding and rebound.  Musculoskeletal:        General: Normal range of motion.     Cervical back: Normal range of motion and neck supple.  Neurological:     General: No focal deficit present.     Mental Status: He is alert.  Psychiatric:        Mood and Affect: Mood normal.        Behavior: Behavior normal.     Assessment/Plan: Abdominal pain with concern for mesenteric ischemia with small bowel necrosis given lactate of 11 and acute renal failure on top of this-recommend emergent laparoscopy possible laparotomy.  Explained that he may die from this.  High operative risk given cirrhosis with hepatitis C with history of varices.  Mortality from the ACS calculator given risk factors anywhere from 10 to 60%.  Risk of overall complication 30 to 60%, risk of progressive renal failure 20%, risk of wound complication 30%, risk of GI bleed 10 to 30%, possible ostomy, possible open abdomen, possible need for reoperation, DVT, myocardial infarction, stroke, and progression of sepsis.  I recommend critical care medicine manage patient due to severe multisystem organ failure and need for ICU care postoperative.  Significant possibility of diffuse small bowel ischemia with death with nothing to offer surgically.  I discussed this with the patient.  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  Proceed emergently to the operating room.  Consult CCM for assistance in managing sepsis and pending pulmonary failure after surgery.  Perl Folmar A Amenah Tucci 01/15/2021, 2:22 PM

## 2021-01-19 NOTE — ED Provider Notes (Signed)
Emergency Department Provider Note   I have reviewed the triage vital signs and the nursing notes.   HISTORY  Chief Complaint Urinary Retention   HPI Cameron Coffey is a 60 y.o. male with past medical history reviewed below including prior prostate surgery presents to the emergency department with severe suprapubic pain and no urine output for the past 24 to 48 hours according to the patient.  [facility] from jail.  He states that he is developed worsening pain to the point of being severe which prompted ED transfer.  He denies any fevers.  Is not having chest pain or shortness of breath.  He received 100 mcg of fentanyl with EMS and was noted to be tachycardic. Pain is constant and severe with no modifying factors.   Past Medical History:  Diagnosis Date  . Cirrhosis (HCC)   . Cirrhosis (HCC)   . Depression   . HCV (hepatitis C virus)   . Heart murmur    "born w/one"  . Hypertension   . Type II diabetes mellitus [facility])     Patient Active Problem List   Diagnosis Date Noted  . Upper GI bleed 12/21/2018  . Cirrhosis of liver 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)   . Chronic hepatitis C without hepatic coma (HCC)   . Bleeding esophageal varices (HCC)   . Depression with anxiety 09/15/2017  . Dyslipidemia associated with type 2 diabetes mellitus (HCC) 07/30/2017  . Type II diabetes with Alishia Lebo term use of insulin (HCC) 07/30/2017  . Essential hypertension 05/28/2015    Past Surgical History:  Procedure Laterality Date  . BIOPSY  01/16/2019   Procedure: BIOPSY;  Surgeon: Benancio Deeds, MD;  Location: [facility] ENDOSCOPY;  Service: Gastroenterology;;  . ESOPHAGEAL BANDING  12/21/2018   Procedure: ESOPHAGEAL BANDING;  Surgeon: Benancio Deeds, MD;  Location: [facility] ENDOSCOPY;  Service: Gastroenterology;;  . ESOPHAGEAL BANDING  01/16/2019   Procedure: ESOPHAGEAL BANDING;  Surgeon: Benancio Deeds, MD;  Location: [facility] ENDOSCOPY;  Service: Gastroenterology;;  . ESOPHAGOGASTRODUODENOSCOPY (EGD)  WITH PROPOFOL N/A 12/21/2018   Procedure: ESOPHAGOGASTRODUODENOSCOPY (EGD) WITH PROPOFOL;  Surgeon: Benancio Deeds, MD;  Location: [facility] ENDOSCOPY;  Service: Gastroenterology;  Laterality: N/A;  . ESOPHAGOGASTRODUODENOSCOPY (EGD) WITH PROPOFOL N/A 01/16/2019   Procedure: ESOPHAGOGASTRODUODENOSCOPY (EGD) WITH PROPOFOL;  Surgeon: Benancio Deeds, MD;  Location: [facility] ENDOSCOPY;  Service: Gastroenterology;  Laterality: N/A;  . TEE WITHOUT CARDIOVERSION N/A 08/10/2017   Procedure: TRANSESOPHAGEAL ECHOCARDIOGRAM (TEE);  Surgeon: Lewayne Bunting, MD;  Location: [facility] ENDOSCOPY;  Service: Cardiovascular;  Laterality: N/A;  . TONSILLECTOMY  1982  . TRANSURETHRAL RESECTION OF PROSTATE N/A 07/26/2017   Procedure: TRANSURETHRAL RESECTION OF THE PROSTATE (TURP) WITH UPROOFING OF PROSTATE ABSCESS;  Surgeon: Bjorn Pippin, MD;  Location: [facility] ORS;  Service: Urology;  Laterality: N/A;    Allergies Patient has no known allergies.  Family History  Problem Relation Age of Onset  . Hyperlipidemia Mother   . Diabetes Mother   . Hyperlipidemia Father   . Diabetes Father     Social History Social History   Tobacco Use  . Smoking status: Current Every Day Smoker    Packs/day: 0.50    Years: 32.00    Pack years: 16.00    Types: Cigarettes  . Smokeless tobacco: Never Used  Vaping Use  . Vaping Use: Never used  Substance Use Topics  . Alcohol use: Yes    Alcohol/week: 6.0 standard drinks    Types: 6 Cans of beer per week    Comment: "  drink qd"-beer-3 cans  . Drug use: Yes    Types: Marijuana    Comment: pt reports last use several months ago.     Review of Systems  Constitutional: No fever/chills Eyes: No visual changes. ENT: No sore throat. Cardiovascular: Denies chest pain. Respiratory: Denies shortness of breath. Gastrointestinal: Positive suprapubic/lower abdominal pain.  No nausea, no vomiting.  No diarrhea.  No constipation. Genitourinary: No urine output.  Musculoskeletal: Negative for  back pain. Skin: Negative for rash. Neurological: Negative for headaches, focal weakness or numbness.  10-point ROS otherwise negative.  ____________________________________________   PHYSICAL EXAM:  VITAL SIGNS: ED Triage Vitals  Enc Vitals Group     BP 02-07-2021 1053 (!) 105/59     Pulse Rate Feb 07, 2021 1053 (!) 107     Resp 02-07-21 1053 20     Temp February 07, 2021 1053 (!) 97.5 F (36.4 C)     Temp Source February 07, 2021 1053 Oral     SpO2 2021-02-07 1053 100 %     Weight Feb 07, 2021 1053 130 lb (59 kg)     Height 07-Feb-2021 1053 5\' 9"  (1.753 m)    Constitutional: Alert and oriented. He appears uncomfortable with frequent shifting in the bed.  Eyes: Conjunctivae are normal.  Head: Atraumatic. Nose: No congestion/rhinnorhea. Mouth/Throat: Mucous membranes are moist.  Neck: No stridor.   Cardiovascular: Tachycardia. Good peripheral circulation. Grossly normal heart sounds.   Respiratory: Normal respiratory effort.  No retractions. Lungs CTAB. Gastrointestinal: Mild distension with diffuse tenderness throughout worse in the lower abdomen. No rebound.  Musculoskeletal: No lower extremity tenderness nor edema. No gross deformities of extremities. Neurologic:  Normal speech and language. No gross focal neurologic deficits are appreciated.  Skin:  Skin is warm, dry and intact. No rash noted.   ____________________________________________   LABS (all labs ordered are listed, but only abnormal results are displayed)  Labs Reviewed  COMPREHENSIVE METABOLIC PANEL - Abnormal; Notable for the following components:      Result Value   Sodium 134 (*)    Chloride 97 (*)    CO2 <7 (*)    Glucose, Bld 113 (*)    BUN 32 (*)    Creatinine, Ser 2.63 (*)    Calcium 8.3 (*)    Albumin 2.5 (*)    AST 150 (*)    ALT 98 (*)    Total Bilirubin 2.2 (*)    GFR, Estimated 27 (*)    All other components within normal limits  CBC WITH DIFFERENTIAL/PLATELET - Abnormal; Notable for the following components:    WBC 0.8 (*)    MCV 103.1 (*)    Platelets 78 (*)    Neutro Abs 0.4 (*)    Lymphs Abs 0.3 (*)    All other components within normal limits  LACTIC ACID, PLASMA - Abnormal; Notable for the following components:   Lactic Acid, Venous >11.0 (*)    All other components within normal limits  LACTIC ACID, PLASMA - Abnormal; Notable for the following components:   Lactic Acid, Venous >11.0 (*)    All other components within normal limits  URINE CULTURE  CULTURE, BLOOD (ROUTINE X 2)  CULTURE, BLOOD (ROUTINE X 2)  RESP PANEL BY RT-PCR (FLU A&B, COVID) ARPGX2  LIPASE, BLOOD  URINALYSIS, ROUTINE W REFLEX MICROSCOPIC  PATHOLOGIST SMEAR REVIEW  PROTIME-INR  APTT  TYPE AND SCREEN   ____________________________________________  RADIOLOGY  CT ABDOMEN PELVIS WO CONTRAST  Result Date: 2021/02/07 CLINICAL DATA:  Abdominal pain elevated lactic facet. Acute renal failure.  EXAM: CT ABDOMEN AND PELVIS WITHOUT CONTRAST TECHNIQUE: Multidetector CT imaging of the abdomen and pelvis was performed following the standard protocol without IV contrast. COMPARISON:  CT scan 08/11/2017 FINDINGS: Lower chest: The lung bases are clear of acute process. No pleural effusion or pulmonary lesions. The heart is normal in size. No pericardial effusion. The distal esophagus and aorta are unremarkable. Hepatobiliary: Stable cirrhotic changes involving the liver. No obvious hepatic lesions without contrast. No intrahepatic biliary dilatation. Gallstones are noted the gallbladder. No common bile duct dilatation. Pancreas: No mass, inflammation or ductal dilatation. Spleen: Normal size.  No focal lesions. Adrenals/Urinary Tract: Adrenal glands and kidneys are unremarkable except for bilateral renal calculi. No obstructing ureteral calculi or hydroureteronephrosis. The bladder is decompressed by Foley catheter but does contain high attenuation material which could be hemorrhage/clot. Stomach/Bowel: The stomach, duodenum, small bowel  and colon demonstrate diffuse wall thickening. Although this could be enterocolitis it also may be due to the patient's cirrhosis and low albumin. No findings for obstruction or perforation. Vascular/Lymphatic: Moderate atherosclerotic calcifications involving the aorta and iliac arteries and branch vessels but no aneurysm. No mesenteric or retroperitoneal mass or adenopathy. Upper abdominal lymph nodes typical with cirrhosis. Reproductive: The prostate gland and seminal vesicles are unremarkable. Other: Mesenteric edema and a small amount of free abdominal and pelvic fluid likely related to the patient's cirrhosis. No free air. Musculoskeletal: No significant bony findings. IMPRESSION: 1. Stable cirrhotic changes involving the liver. No obvious hepatic lesions without contrast. 2. Cholelithiasis. 3. Bilateral renal calculi but no obstructing ureteral calculi or hydroureteronephrosis. 4. Diffuse bowel wall thickening could be due to the patient's cirrhosis and low albumin. 5. High attenuation material in the bladder could be hemorrhage/clot. Aortic Atherosclerosis (ICD10-I70.0). Electronically Signed   By: Rudie Meyer M.D.   On: 01/16/2021 13:25   DG Chest Port 1 View  Result Date: 01/08/2021 CLINICAL DATA:  Sepsis, abdominal pain EXAM: PORTABLE CHEST 1 VIEW COMPARISON:  08/04/2017 FINDINGS: The heart size and mediastinal contours are within normal limits. Scattered atherosclerotic calcification of the aortic knob. No focal airspace consolidation, pleural effusion, or pneumothorax. The visualized skeletal structures are unremarkable. IMPRESSION: No active disease. Electronically Signed   By: Duanne Guess D.O.   On: 01/22/2021 13:16    ____________________________________________   PROCEDURES  Procedure(s) performed:   .Critical Care Performed by: Maia Plan, MD Authorized by: Maia Plan, MD   Critical care provider statement:    Critical care time (minutes):  75   Critical care time  was exclusive of:  Separately billable procedures and treating other patients and teaching time   Critical care was necessary to treat or prevent imminent or life-threatening deterioration of the following conditions:  Shock   Critical care was time spent personally by me on the following activities:  Discussions with consultants, evaluation of patient's response to treatment, examination of patient, ordering and performing treatments and interventions, ordering and review of laboratory studies, ordering and review of radiographic studies, pulse oximetry, re-evaluation of patient's condition, obtaining history from patient or surrogate, review of old charts, blood draw for specimens and development of treatment plan with patient or surrogate   I assumed direction of critical care for this patient from another provider in my specialty: no     Care discussed with: admitting provider       ____________________________________________   INITIAL IMPRESSION / ASSESSMENT AND PLAN / ED COURSE  Pertinent labs & imaging results that were available during my care of the  patient were reviewed by me and considered in my medical decision making (see chart for details).   Patient presents emergency department for evaluation of severe lower abdominal pain with lack of urine output.  My initial impression is that this is highly likely to be acute urinary retention.  He has history of prior prostate surgery in 2018.  Plan for Foley catheter placement, UA, reassess symptoms afterwards.  If he continues to have significant discomfort or no significant retention will need additional lab work and abdominal imaging.   Foley placed by nurse with minimal output. No pain or blood with balloon inflation. Added labs and CT abdomen/pelvis.  Performed bedside ultrasound I do not see a distended bladder.   12:50 PM  Patient's lab work came back significantly abnormal.  He has acute kidney injury, very high lactic acid,  elevated LFTs and bilirubin.  Patient's blood pressures are soft although remains awake and alert.  Continues to have significant discomfort.  He is going urgently to CT without contrast given his AKI and will bolus fluids and start antibiotics.   01:56 PM  CT imaging resulted describing diffuse bowel wall edema.  Given the patient's significant pain, abnormal exam, a lactic acid that is significantly elevated along with hypotension I do have concern clinically for ischemic bowel and discussed the case with Dr. Luisa Hartornett who will come down to evaluate the patient to see if he is an operative candidate.  Will discuss with ICU regarding admit.   02:20 PM  Dr. Luisa Hartornett plans to take for emergent ex lap. Called lab to confirm that COVID is running.   Discussed patient's case with ICU, Dr. Sherene SiresWert to request admission. Patient updated with plan.  I reviewed all nursing notes, vitals, pertinent old records, EKGs, labs, imaging (as available).  ____________________________________________  FINAL CLINICAL IMPRESSION(S) / ED DIAGNOSES  Final diagnoses:  Shock (HCC)  Lactic acidosis     MEDICATIONS GIVEN DURING THIS VISIT:  Medications  lactated ringers infusion ( Intravenous New Bag/Given February 07, 2021 1255)  metroNIDAZOLE (FLAGYL) IVPB 500 mg (has no administration in time range)  vancomycin (VANCOCIN) IVPB 1000 mg/200 mL premix (has no administration in time range)  Chlorhexidine Gluconate Cloth 2 % PADS 6 each (has no administration in time range)    And  Chlorhexidine Gluconate Cloth 2 % PADS 6 each (has no administration in time range)  cefoTEtan (CEFOTAN) 2 g in sodium chloride 0.9 % 100 mL IVPB (has no administration in time range)  morphine 4 MG/ML injection 4 mg (4 mg Intravenous Given February 07, 2021 1114)  ondansetron (ZOFRAN) injection 4 mg (4 mg Intravenous Given February 07, 2021 1113)  lactated ringers bolus 1,000 mL (0 mLs Intravenous Stopped February 07, 2021 1409)    And  lactated ringers bolus 1,000 mL (1,000  mLs Intravenous New Bag/Given February 07, 2021 1404)  ceFEPIme (MAXIPIME) 2 g in sodium chloride 0.9 % 100 mL IVPB (2 g Intravenous New Bag/Given February 07, 2021 1351)    Note:  This document was prepared using Dragon voice recognition software and may include unintentional dictation errors.  Alona BeneJoshua Nihal Marzella, MD, [facility] Emergency [facility]    Kellie Murrill, Arlyss RepressJoshua G, MD 0April 01, 2022 [phone]

## 2021-01-19 NOTE — ED Notes (Signed)
Pt. Documented in error see above note in chart. 

## 2021-01-20 ENCOUNTER — Inpatient Hospital Stay (HOSPITAL_COMMUNITY): Payer: Medicaid Other

## 2021-01-20 ENCOUNTER — Encounter (HOSPITAL_COMMUNITY): Payer: Self-pay | Admitting: Surgery

## 2021-01-20 DIAGNOSIS — E872 Acidosis: Secondary | ICD-10-CM

## 2021-01-20 LAB — BLOOD CULTURE ID PANEL (REFLEXED) - BCID2

## 2021-01-20 LAB — BLOOD GAS, ARTERIAL
Acid-base deficit: 11.6 mmol/L — ABNORMAL HIGH (ref 0.0–2.0)
Acid-base deficit: 17.4 mmol/L — ABNORMAL HIGH (ref 0.0–2.0)
Bicarbonate: 10.3 mmol/L — ABNORMAL LOW (ref 20.0–28.0)
Bicarbonate: 13.9 mmol/L — ABNORMAL LOW (ref 20.0–28.0)
FIO2: 60
FIO2: 60
MECHVT: 580 mL
MECHVT: 580 mL
O2 Content: 97 L/min
O2 Content: 98 L/min
O2 Saturation: 98.6 %
O2 Saturation: 98.8 %
PEEP: 5 cmH2O
PEEP: 5 cmH2O
Patient temperature: 98.6
Patient temperature: 98.6
RATE: 35 resp/min
RATE: 35 resp/min
pCO2 arterial: 31.4 mmHg — ABNORMAL LOW (ref 32.0–48.0)
pCO2 arterial: 31.6 mmHg — ABNORMAL LOW (ref 32.0–48.0)
pH, Arterial: 7.139 — CL (ref 7.350–7.450)
pH, Arterial: 7.27 — ABNORMAL LOW (ref 7.350–7.450)
pO2, Arterial: 172 mmHg — ABNORMAL HIGH (ref 83.0–108.0)
pO2, Arterial: 207 mmHg — ABNORMAL HIGH (ref 83.0–108.0)

## 2021-01-20 LAB — URINALYSIS, ROUTINE W REFLEX MICROSCOPIC
Bilirubin Urine: NEGATIVE
Glucose, UA: 50 mg/dL — AB
Ketones, ur: NEGATIVE mg/dL
Leukocytes,Ua: NEGATIVE
Nitrite: NEGATIVE
Protein, ur: 100 mg/dL — AB
Specific Gravity, Urine: 1.011 (ref 1.005–1.030)
pH: 5 (ref 5.0–8.0)

## 2021-01-20 LAB — RENAL FUNCTION PANEL
Albumin: 1.5 g/dL — ABNORMAL LOW (ref 3.5–5.0)
Anion gap: 42 — ABNORMAL HIGH (ref 5–15)
BUN: 26 mg/dL — ABNORMAL HIGH (ref 6–20)
CO2: 13 mmol/L — ABNORMAL LOW (ref 22–32)
Calcium: 6.9 mg/dL — ABNORMAL LOW (ref 8.9–10.3)
Chloride: 96 mmol/L — ABNORMAL LOW (ref 98–111)
Creatinine, Ser: 2.79 mg/dL — ABNORMAL HIGH (ref 0.61–1.24)
GFR, Estimated: 25 mL/min — ABNORMAL LOW (ref >60)
Glucose, Bld: 81 mg/dL (ref 70–99)
Phosphorus: 7.1 mg/dL — ABNORMAL HIGH (ref 2.5–4.6)
Potassium: 4.2 mmol/L (ref 3.5–5.1)
Sodium: 151 mmol/L — ABNORMAL HIGH (ref 135–145)

## 2021-01-20 LAB — RAPID URINE DRUG SCREEN, HOSP PERFORMED
Amphetamines: NOT DETECTED
Barbiturates: NOT DETECTED
Benzodiazepines: POSITIVE — AB
Cocaine: NOT DETECTED
Opiates: POSITIVE — AB
Tetrahydrocannabinol: NOT DETECTED

## 2021-01-20 LAB — CBC
HCT: 28.7 % — ABNORMAL LOW (ref 39.0–52.0)
Hemoglobin: 9.3 g/dL — ABNORMAL LOW (ref 13.0–17.0)
MCH: 32.3 pg (ref 26.0–34.0)
MCHC: 32.4 g/dL (ref 30.0–36.0)
MCV: 99.7 fL (ref 80.0–100.0)
Platelets: 36 10*3/uL — ABNORMAL LOW (ref 150–400)
RBC: 2.88 MIL/uL — ABNORMAL LOW (ref 4.22–5.81)
RDW: 15.2 % (ref 11.5–15.5)
WBC: 1.3 10*3/uL — CL (ref 4.0–10.5)
nRBC: 0 % (ref 0.0–0.2)

## 2021-01-20 LAB — MRSA PCR SCREENING: MRSA by PCR: NEGATIVE

## 2021-01-20 LAB — HEPATIC FUNCTION PANEL
ALT: 361 U/L — ABNORMAL HIGH (ref 0–44)
AST: 1574 U/L — ABNORMAL HIGH (ref 15–41)
Albumin: 1.5 g/dL — ABNORMAL LOW (ref 3.5–5.0)
Alkaline Phosphatase: 33 U/L — ABNORMAL LOW (ref 38–126)
Bilirubin, Direct: 1.2 mg/dL — ABNORMAL HIGH (ref 0.0–0.2)
Indirect Bilirubin: 1.5 mg/dL — ABNORMAL HIGH (ref 0.3–0.9)
Total Bilirubin: 2.7 mg/dL — ABNORMAL HIGH (ref 0.3–1.2)
Total Protein: 3.6 g/dL — ABNORMAL LOW (ref 6.5–8.1)

## 2021-01-20 LAB — MAGNESIUM: Magnesium: 2 mg/dL (ref 1.7–2.4)

## 2021-01-20 LAB — GLUCOSE, CAPILLARY
Glucose-Capillary: 37 mg/dL — CL (ref 70–99)
Glucose-Capillary: 120 mg/dL — ABNORMAL HIGH (ref 70–99)
Glucose-Capillary: 124 mg/dL — ABNORMAL HIGH (ref 70–99)
Glucose-Capillary: 69 mg/dL — ABNORMAL LOW (ref 70–99)

## 2021-01-20 LAB — D-DIMER, QUANTITATIVE: D-Dimer, Quant: >20 ug/mL-FEU — ABNORMAL HIGH (ref 0.00–0.50)

## 2021-01-20 LAB — FIBRINOGEN: Fibrinogen: 146 mg/dL — ABNORMAL LOW (ref 210–475)

## 2021-01-20 LAB — APTT
aPTT: 83 seconds — ABNORMAL HIGH (ref 24–36)
aPTT: 59 seconds — ABNORMAL HIGH (ref 24–36)

## 2021-01-20 LAB — PROTIME-INR
INR: 3.8 — ABNORMAL HIGH (ref 0.8–1.2)
Prothrombin Time: 36.4 seconds — ABNORMAL HIGH (ref 11.4–15.2)

## 2021-01-20 MED ORDER — GLYCOPYRROLATE 1 MG PO TABS: 1.0000 mg | ORAL_TABLET | ORAL | Status: DC | PRN

## 2021-01-20 MED ORDER — PIPERACILLIN-TAZOBACTAM 3.375 G IVPB 30 MIN
3.3750 g | Freq: Four times a day (QID) | INTRAVENOUS | Status: DC
  Administered 2021-01-20: 3.375 g via INTRAVENOUS
  Filled 2021-01-20 (×4): qty 50

## 2021-01-20 MED ORDER — DEXTROSE 50 % IV SOLN
INTRAVENOUS | Status: AC
  Administered 2021-01-20: 25 g via INTRAVENOUS
  Filled 2021-01-20: qty 50

## 2021-01-20 MED ORDER — HYDROCORTISONE NA SUCCINATE PF 100 MG IJ SOLR
100.0000 mg | INTRAMUSCULAR | Status: AC
  Administered 2021-01-20: 100 mg via INTRAVENOUS
  Filled 2021-01-20: qty 2

## 2021-01-20 MED ORDER — MORPHINE 100MG IN NS 100ML (1MG/ML) PREMIX INFUSION
1.0000 mg/h | INTRAVENOUS | Status: DC
  Administered 2021-01-20: 1 mg/h via INTRAVENOUS
  Filled 2021-01-20: qty 100

## 2021-01-20 MED ORDER — GLYCOPYRROLATE 0.2 MG/ML IJ SOLN
0.2000 mg | INTRAMUSCULAR | Status: DC | PRN
Start: 2021-01-20 — End: 2021-01-21

## 2021-01-20 MED ORDER — ONDANSETRON HCL 4 MG/2ML IJ SOLN: 4.0000 mg | Freq: Four times a day (QID) | INTRAMUSCULAR | Status: DC | PRN

## 2021-01-20 MED ORDER — DIPHENHYDRAMINE HCL 50 MG/ML IJ SOLN: 25.0000 mg | INTRAMUSCULAR | Status: DC | PRN

## 2021-01-20 MED ORDER — ACETAMINOPHEN 650 MG RE SUPP: 650.0000 mg | Freq: Four times a day (QID) | RECTAL | Status: DC | PRN

## 2021-01-20 MED ORDER — ACETAMINOPHEN 325 MG PO TABS: 650.0000 mg | ORAL_TABLET | Freq: Four times a day (QID) | ORAL | Status: DC | PRN

## 2021-01-20 MED ORDER — SODIUM BICARBONATE 8.4 % IV SOLN
50.0000 meq | Freq: Once | INTRAVENOUS | Status: AC
  Administered 2021-01-20: 50 meq via INTRAVENOUS

## 2021-01-20 MED ORDER — DEXTROSE 5 % IV SOLN: INTRAVENOUS | Status: DC

## 2021-01-20 MED ORDER — GLYCOPYRROLATE 0.2 MG/ML IJ SOLN: 0.2000 mg | INTRAMUSCULAR | Status: DC | PRN

## 2021-01-20 MED ORDER — POLYVINYL ALCOHOL 1.4 % OP SOLN
1.0000 [drp] | Freq: Four times a day (QID) | OPHTHALMIC | Status: DC | PRN
  Filled 2021-01-20: qty 15

## 2021-01-20 MED ORDER — MORPHINE BOLUS VIA INFUSION
2.0000 mg | INTRAVENOUS | Status: DC | PRN
  Filled 2021-01-20: qty 2

## 2021-01-20 MED ORDER — CALCIUM CHLORIDE 10 % IV SOLN
1.0000 g | Freq: Once | INTRAVENOUS | Status: AC
  Administered 2021-01-20: 1 g via INTRAVENOUS
  Filled 2021-01-20: qty 10

## 2021-01-20 MED ORDER — DEXTROSE 10 % IV SOLN: INTRAVENOUS | Status: DC

## 2021-01-20 MED ORDER — LORAZEPAM 2 MG/ML IJ SOLN: 2.0000 mg | INTRAMUSCULAR | Status: DC | PRN

## 2021-01-20 MED ORDER — PHENYLEPHRINE HCL-NACL 10-0.9 MG/250ML-% IV SOLN: 0.0000 ug/min | INTRAVENOUS | Status: DC

## 2021-01-20 MED ORDER — EPINEPHRINE PF 1 MG/ML IJ SOLN: 0.5000 ug/min | INTRAVENOUS | Status: DC

## 2021-01-20 MED ORDER — DEXTROSE 50 % IV SOLN: 25.0000 g | INTRAVENOUS | Status: AC

## 2021-01-20 MED ORDER — DEXTROSE 50 % IV SOLN
INTRAVENOUS | Status: AC
  Administered 2021-01-20: 50 mL
  Filled 2021-01-20: qty 50

## 2021-01-20 MED ORDER — HYDROCORTISONE NA SUCCINATE PF 100 MG IJ SOLR: 50.0000 mg | Freq: Four times a day (QID) | INTRAMUSCULAR | Status: DC

## 2021-01-20 MED ORDER — ONDANSETRON 4 MG PO TBDP: 4.0000 mg | ORAL_TABLET | Freq: Four times a day (QID) | ORAL | Status: DC | PRN

## 2021-01-20 MED FILL — Medication: Qty: 1 | Status: AC

## 2021-01-21 LAB — GLUCOSE, CAPILLARY: Glucose-Capillary: 36 mg/dL — CL (ref 70–99)

## 2021-01-21 LAB — URINE CULTURE: Culture: NO GROWTH

## 2021-01-21 LAB — CALCIUM, IONIZED
Calcium, Ionized, Serum: 4.1 mg/dL — ABNORMAL LOW (ref 4.5–5.6)
Calcium, Ionized, Serum: 3.7 mg/dL — ABNORMAL LOW (ref 4.5–5.6)

## 2021-01-22 LAB — CULTURE, BLOOD (ROUTINE X 2)
Special Requests: ADEQUATE
Special Requests: ADEQUATE

## 2021-01-22 LAB — SURGICAL PATHOLOGY

## 2021-01-23 LAB — TYPE AND SCREEN
ABO/RH(D): O POS
Antibody Screen: NEGATIVE
Unit division: 0
Unit division: 0

## 2021-01-23 LAB — BPAM RBC
Blood Product Expiration Date: 202204052359
Blood Product Expiration Date: 202204122359
ISSUE DATE / TIME: 202203131645
ISSUE DATE / TIME: 202203131645
Unit Type and Rh: 5100
Unit Type and Rh: 5100

## 2021-01-30 LAB — URINE DRUGS OF ABUSE SCREEN W ALC, ROUTINE (REF LAB)
Amphetamines, Urine: NEGATIVE ng/mL
Barbiturate, Ur: NEGATIVE ng/mL
Cannabinoid Quant, Ur: NEGATIVE ng/mL
Cocaine (Metab.): NEGATIVE ng/mL
Ethanol U, Quan: NEGATIVE %
Methadone Screen, Urine: NEGATIVE ng/mL
Phencyclidine, Ur: NEGATIVE ng/mL
Propoxyphene, Urine: NEGATIVE ng/mL

## 2021-01-30 LAB — DRUG PROFILE 799031: BENZODIAZEPINES: NEGATIVE

## 2021-01-30 LAB — OPIATES CONFIRMATION, URINE
CODEINE: NEGATIVE
MORPHINE: POSITIVE — AB
Morphine GC/MS Conf: 783 ng/mL
OPIATES: POSITIVE — AB

## 2021-02-07 NOTE — Progress Notes (Signed)
Assumed care at 2000.  Patient needing CRRT, Trialysis catheter placed, CRRT initiated although access giving trouble, trialysis changed and CRRT running. Not pulling d/t hypotension and verbal order.  Severe hypotension throughout the shift, maxed out on Levo, and Epi at 70 per hr, on vaso as well. Pt has received 18 amps Sodium Bicarb as well as 1g calcium chloride x2 and calcium gluconate. Due to emergent need for sodium bicarb the 18 amps were given per verbal order by MD Stretch.

## 2021-02-07 NOTE — Treatment Plan (Addendum)
Underlying cirrhosis presented to ED with ongoing abdominal pain and anuria for 24 to 48 hours.  In renal failure, shock, ischemic bowel.  Status post ex lap with removal of ischemic bowel.  Returns intubated, on CRRT for refractory acidemia as well as renal failure.  Throughout the night has been maintained on very high doses of vasopressors.  Epi and norepi above likely physiologic effective limit as well as vasopressin.  Blood pressure 60s over 40s on art line with good waveform.  Given underlying cirrhosis, multiorgan failure his prognosis is almost certainly death from this injury.  Spoke with sister, Mrs. Cameron Coffey, over phone.  Only contact we have in the system.  Discussed everything that happened.  She states he would not want to live on life support unless will remain fully independent after hospitalization.  I shared these wishes are not realistic.  She understood.  Recommend DNR and comfort measures.  She understood and also agreed to this.  Explained that he is under the custody of the [facility] department.  The logistics of body retrieval, afterlife care to be handled by our staff.  Will notify medical staff at [facility].  Comfort measures ordered, narcotic drip ordered.  To stop CRRT.  Discussed case with medical examiner as he is in custody of law enforcement agency. Confirmed will be ME case.

## 2021-02-07 NOTE — Progress Notes (Signed)
eLink Physician-Brief Progress Note Patient Name: SHANKAR SILBER DOB: 1961/03/21 MRN: [id number]   Date of Service  01/14/2021  HPI/Events of Note  MAP 47-49 despite being on levo 70 mcg/min, epi 70 mcg/min, vasopressin, and having substantial improvement in his acid-base status on CRRT and bicarb drip.   Most recent ABG 7.27/31/172/14/BE -11.6.  This compared with earlier ABG: 6.993/42/422/9.6/-20.7.   eICU Interventions  Stop peripheral NaHCO3 infusion. It is adding volume but probably not contributing much to improving acid-base status now that CRRT is running well.  Check CBC, BMP and ionized calcium.  Continue with pressors, CRRT and stress-dose steroids.     Intervention Category Intermediate Interventions: Hypotension - evaluation and management;Other:  Janae Bridgeman 01/23/2021, 5:36 AM

## 2021-02-07 NOTE — Progress Notes (Signed)
eLink Physician-Brief Progress Note Patient Name: Cameron Coffey DOB: 10/01/1961 MRN: [id number]   Date of Service  01/19/2021  HPI/Events of Note  Micro lab reports patient is growing pan-sensitive E.coli in 4/4 blood culture bottles. He is already on Zosyn.   eICU Interventions  No changes necessary at this time. Findings are consistent with suspected peritonitis c/b bacteremia in setting of ischemic gut.     Intervention Category Intermediate Interventions: Infection - evaluation and management  Marveen Reeks Pura Picinich 01/14/2021, 3:27 AM

## 2021-02-07 NOTE — Progress Notes (Signed)
Date and time results received: 02/06/2021 0120   Critical Value: ph 7.139  Name of Provider Notified: 02/04/2021, elink

## 2021-02-07 NOTE — Anesthesia Postprocedure Evaluation (Signed)
Anesthesia Post Note  Patient: Cameron Coffey  Procedure(s) Performed: DIAGNOSTIC LAPAROSCOPY; LAPAROTOMY WITH RIGHT PARTIAL COLECTOMY; ABTHERA WOUND VAC PLACEMENT (N/A Abdomen)     Patient location during evaluation: SICU Anesthesia Type: General Level of consciousness: sedated Pain management: pain level controlled Vital Signs Assessment: vitals unstable Respiratory status: patient remains intubated per anesthesia plan Cardiovascular status: unstable Postop Assessment: no apparent nausea or vomiting Anesthetic complications: no Comments: Patient in critical condition needing active resuscitation, pressors, bicarb for severe acidosis, glucose for hypoglycemia and oliguric. Transferred to ICU intubated with monitor and report given to bedside team   No complications documented.  Last Vitals:  Vitals:   01/17/2021 0230 02/03/2021 0245  BP:    Pulse: (!) 116 (!) 115  Resp: (!) 35 (!) 35  Temp:    SpO2: 98% 98%    Last Pain:  Vitals:   01/12/2021 0000  TempSrc: Axillary  PainSc:                  Sharonda Llamas

## 2021-02-07 NOTE — Progress Notes (Signed)
Cameron Coffey [id number] Feb 28, 1961  CARE TEAM:  PCP: Franciso Bend, NP  Outpatient Care Team: Patient Care Team: Franciso Bend, NP as PCP - General (Nurse Practitioner)  Inpatient Treatment Team: Treatment Team: Attending Provider: Marcelle Smiling, MD; Consulting Physician: Montez Morita, Md, MD; Technician: Meda Klinefelter, NT; Consulting Physician: Delano Metz, MD; Registered Nurse: Aldean Jewett, RN; Utilization Review: Hanley Hays, RN; Technician: Delynn Flavin; Case Manager: Shon Baton, RN; Registered Nurse: Dahlia Client, RN; Attending Physician: Pccm, Md, MD   Problem List:   Active Problems:   Cirrhosis of liver 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)   Chronic hepatitis C without hepatic coma (HCC)   Thrombocytopenia (HCC)   Leukopenia   Lactic acidosis   AKI (acute kidney injury) (HCC)   Mesenteric ischemia (HCC)   1 Day Post-Op  01-30-2021  Preoperative diagnosis: Peritonitis with possible mesenteric ischemia  Postoperative diagnosis: Mesenteric ischemia with necrotic right colon and perforation with overwhelming sepsis  Procedure:  Exploratory laparotomy Partial "right" colectomy Placement of vacuum pack dressing   Surgeon: Harriette Bouillon, MD  Assessment  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  High risk for need for further bowel resection.  Removal of packing.  Usually wait 48 hours from initial event in the hopes that the patient can be medically stabilized.  Unfortunately, patient is on maximal pressors and still hypotensive and struggling.  Prognosis grim.  Surgery will continue to follow aggressively.  Nephrology following.  Felt to benefit from venovenous dialysis in the hope of more aggressively correcting acidosis and volume issues  in the setting of cirrhosis and multisystem organ failure.  Looks like he is hypotensive on that.  May need to stop.  Defer to them.      25 minutes spent in review, evaluation, examination, counseling, and coordination of care.   I have reviewed this patient's available data, including medical history, events of note, physical examination and test results as part of my evaluation.  A significant portion of that time was spent in counseling.  Care during the described time interval was provided by me.  01/14/2021    Subjective: (Chief complaint)  Intubated  On high-dose pressors.  Getting renal replacement therapy -dropping blood pressure  ICU nurses & law enforcement in room  Objective:  Vital signs:  Vitals:   01/15/2021 0645 01/14/2021 0700 01/11/2021 0715 01/23/2021 0730  BP:      Pulse: (!) 105 (!) 105 (!) 108 (!) 105  Resp: (!) 35 (!) 35 (!) 35 (!) 35  Temp:      TempSrc:      SpO2: 99% 94% 96% 100%  Weight:      Height:        Last BM Date:  (PTA)  Intake/Output   Yesterday:  03/13 0701 - 03/14 0700 In: 11552.4 [I.V.:9352.4; IV Piggyback:2200] Out: 3748 [Urine:450; Emesis/NG output:400; Drains:1400; Blood:400] This shift:  No intake/output data recorded.  Bowel function:  Flatus: No  BM:  No  Drain: Abdominal wound VAC with serosanguineous drainage.  Mainly ascites   Physical Exam:  General: Pt intubated and unresponsive.   Eyes: PERRL HENT: Normocephalic, Mucus membranes moist.  No thrush.  ET tube and OG tube in place Neck: Supple, No tracheal deviation.  No obvious thyromegaly Chest: No pain to chest wall  compression.  Good respiratory excursion.  No audible wheezing CV:  Pulses intact.  Regular rhythm.  No major extremity edema MS: Normal AROM mjr joints.  No obvious deformity  Abdomen: Rigid.  Very distended.  Abdominal peritoneal wound VAC in place with sponge.  Thinly bloody ascites drainage..   No incarcerated hernias.  Ext:  No deformity.  No  mjr edema.  No cyanosis Skin: No major sores.  Cooland dry    Results:   Cultures: Recent Results (from the past 720 hour(s))  Culture, blood (routine x 2)     Status: None (Preliminary result)   Collection Time: 13-Feb-2021  1:50 PM   Specimen: BLOOD RIGHT FOREARM  Result Value Ref Range Status   Specimen Description   Final    BLOOD RIGHT FOREARM Performed at [facility], [address], [place], [place] [address]    Special Requests   Final    BOTTLES DRAWN AEROBIC AND ANAEROBIC Blood Culture adequate volume Performed at [facility] [address], [place], [place] [address]    Culture  Setup Time   Final    GRAM NEGATIVE RODS IN BOTH AEROBIC AND ANAEROBIC BOTTLES CRITICAL RESULT CALLED TO, READ BACK BY AND VERIFIED WITH: Spero Curb 0981 01/31/2021 Girtha Hake Performed at [facility], [address] [place], [place] [address]    Culture GRAM NEGATIVE RODS  Final   Report Status PENDING  Incomplete  Resp Panel by RT-PCR (Flu A&B, Covid) Nasopharyngeal Swab     Status: None   Collection Time: 02/13/21  1:50 PM   Specimen: Nasopharyngeal Swab; Nasopharyngeal(NP) swabs in vial transport medium  Result Value Ref Range Status   SARS Coronavirus 2 by RT PCR NEGATIVE NEGATIVE Final    Comment: (NOTE) SARS-CoV-2 target nucleic acids are NOT DETECTED.  The SARS-CoV-2 RNA is generally detectable in upper respiratory specimens during the acute phase of infection. The lowest concentration of SARS-CoV-2 viral copies this assay can detect is 138 copies/mL. A negative result does not preclude SARS-Cov-2 infection and should not be used as the sole basis for treatment or other patient management decisions. A negative result may occur with  improper specimen collection/handling, submission of specimen other than nasopharyngeal swab, presence of viral mutation(s) within the areas targeted by this assay, and inadequate number of  viral copies(<138 copies/mL). A negative result must be combined with clinical observations, patient history, and epidemiological information. The expected result is Negative.  Fact Sheet for Patients:  BloggerCourse.com  Fact Sheet for Healthcare Providers:  SeriousBroker.it  This test is no t yet approved or cleared by the [place] FDA and  has been authorized for detection and/or diagnosis of SARS-CoV-2 by FDA under an Emergency Use Authorization (EUA). This EUA will remain  in effect (meaning this test can be used) for the duration of the COVID-19 declaration under Section 564(b)(1) of the Act, 21 U.S.C.section 360bbb-3(b)(1), unless the authorization is terminated  or revoked sooner.       Influenza A by PCR NEGATIVE NEGATIVE Final   Influenza B by PCR NEGATIVE NEGATIVE Final    Comment: (NOTE) The Xpert Xpress SARS-CoV-2/FLU/RSV plus assay is intended as an aid in the diagnosis of influenza from Nasopharyngeal swab specimens and should not be used as a sole basis for treatment. Nasal washings and aspirates are unacceptable for Xpert Xpress SARS-CoV-2/FLU/RSV testing.  Fact Sheet for Patients: BloggerCourse.com  Fact Sheet for Healthcare Providers: SeriousBroker.it  This test is not yet approved or  cleared by the [place] and has been authorized for detection and/or diagnosis of SARS-CoV-2 by FDA under an Emergency Use Authorization (EUA). This EUA will remain in effect (meaning this test can be used) for the duration of the COVID-19 declaration under Section 564(b)(1) of the Act, 21 U.S.C. section 360bbb-3(b)(1), unless the authorization is terminated or revoked.  Performed at [facility], [address] [place], [place] [address]   Blood Culture ID Panel (Reflexed)     Status: Abnormal   Collection Time: Feb 08, 2021  1:50 PM  Result  Value Ref Range Status   Enterococcus faecalis NOT DETECTED NOT DETECTED Final   Enterococcus Faecium NOT DETECTED NOT DETECTED Final   Listeria monocytogenes NOT DETECTED NOT DETECTED Final   Staphylococcus species NOT DETECTED NOT DETECTED Final   Staphylococcus aureus (BCID) NOT DETECTED NOT DETECTED Final   Staphylococcus epidermidis NOT DETECTED NOT DETECTED Final   Staphylococcus lugdunensis NOT DETECTED NOT DETECTED Final   Streptococcus species NOT DETECTED NOT DETECTED Final   Streptococcus agalactiae NOT DETECTED NOT DETECTED Final   Streptococcus pneumoniae NOT DETECTED NOT DETECTED Final   Streptococcus pyogenes NOT DETECTED NOT DETECTED Final   A.calcoaceticus-baumannii NOT DETECTED NOT DETECTED Final   Bacteroides fragilis NOT DETECTED NOT DETECTED Final   Enterobacterales DETECTED (A) NOT DETECTED Final    Comment: Enterobacterales represent a large order of gram negative bacteria, not a single organism. CRITICAL RESULT CALLED TO, READ BACK BY AND VERIFIED WITH: E. JACKSON,PHARMD 0315 01/08/2021 T. TYSOR    Enterobacter cloacae complex NOT DETECTED NOT DETECTED Final   Escherichia coli DETECTED (A) NOT DETECTED Final    Comment: CRITICAL RESULT CALLED TO, READ BACK BY AND VERIFIED WITH: E. JACKSON,PHARMD 0315 01/10/2021 T. TYSOR    Klebsiella aerogenes NOT DETECTED NOT DETECTED Final   Klebsiella oxytoca NOT DETECTED NOT DETECTED Final   Klebsiella pneumoniae NOT DETECTED NOT DETECTED Final   Proteus species NOT DETECTED NOT DETECTED Final   Salmonella species NOT DETECTED NOT DETECTED Final   Serratia marcescens NOT DETECTED NOT DETECTED Final   Haemophilus influenzae NOT DETECTED NOT DETECTED Final   Neisseria meningitidis NOT DETECTED NOT DETECTED Final   Pseudomonas aeruginosa NOT DETECTED NOT DETECTED Final   Stenotrophomonas maltophilia NOT DETECTED NOT DETECTED Final   Candida albicans NOT DETECTED NOT DETECTED Final   Candida auris NOT DETECTED NOT DETECTED  Final   Candida glabrata NOT DETECTED NOT DETECTED Final   Candida krusei NOT DETECTED NOT DETECTED Final   Candida parapsilosis NOT DETECTED NOT DETECTED Final   Candida tropicalis NOT DETECTED NOT DETECTED Final   Cryptococcus neoformans/gattii NOT DETECTED NOT DETECTED Final   CTX-M ESBL NOT DETECTED NOT DETECTED Final   Carbapenem resistance IMP NOT DETECTED NOT DETECTED Final   Carbapenem resistance KPC NOT DETECTED NOT DETECTED Final   Carbapenem resistance NDM NOT DETECTED NOT DETECTED Final   Carbapenem resist OXA 48 LIKE NOT DETECTED NOT DETECTED Final   Carbapenem resistance VIM NOT DETECTED NOT DETECTED Final    Comment: Performed at [facility], [address] [place], [place] [address]  Culture, blood (routine x 2)     Status: None (Preliminary result)   Collection Time: 2021-02-08  1:55 PM   Specimen: BLOOD LEFT FOREARM  Result Value Ref Range Status   Specimen Description   Final    BLOOD LEFT FOREARM Performed at [facility], [address] [place], [place] [address]    Special Requests  Final    BOTTLES DRAWN AEROBIC AND ANAEROBIC Blood Culture adequate volume Performed at [facility], [address] [place], [place] [address]    Culture  Setup Time   Final    GRAM NEGATIVE RODS IN BOTH AEROBIC AND ANAEROBIC BOTTLES CRITICAL VALUE NOTED.  VALUE IS CONSISTENT WITH PREVIOUSLY REPORTED AND CALLED VALUE. Performed at [facility], [address] [place], [place] [address]    Culture GRAM NEGATIVE RODS  Final   Report Status PENDING  Incomplete  MRSA PCR Screening     Status: None   Collection Time: 01/18/2021  6:27 PM   Specimen: Nasal Mucosa; Nasopharyngeal  Result Value Ref Range Status   MRSA by PCR NEGATIVE NEGATIVE Final    Comment:        The GeneXpert MRSA Assay (FDA approved for NASAL specimens only), is one component of a comprehensive MRSA colonization surveillance program. It is not intended to  diagnose MRSA infection nor to guide or monitor treatment for MRSA infections. Performed at [facility], [address], [place], [place] [address]     Labs: Results for orders placed or performed during the hospital encounter of 01/26/2021 (from the past 48 hour(s))  Comprehensive metabolic panel     Status: Abnormal   Collection Time: 01/18/2021 11:46 AM  Result Value Ref Range   Sodium 134 (L) 135 - 145 mmol/L   Potassium 4.2 3.5 - 5.1 mmol/L   Chloride 97 (L) 98 - 111 mmol/L   CO2 <7 (L) 22 - 32 mmol/L   Glucose, Bld 113 (H) 70 - 99 mg/dL    Comment: Glucose reference range applies only to samples taken after fasting for at least 8 hours.   BUN 32 (H) 6 - 20 mg/dL   Creatinine, Ser 9.14 (H) 0.61 - 1.24 mg/dL   Calcium 8.3 (L) 8.9 - 10.3 mg/dL   Total Protein 7.7 6.5 - 8.1 g/dL   Albumin 2.5 (L) 3.5 - 5.0 g/dL   AST 782 (H) 15 - 41 U/L   ALT 98 (H) 0 - 44 U/L    Comment: RESULTS CONFIRMED BY MANUAL DILUTION   Alkaline Phosphatase 60 38 - 126 U/L   Total Bilirubin 2.2 (H) 0.3 - 1.2 mg/dL   GFR, Estimated 27 (L) >60 mL/min    Comment: (NOTE) Calculated using the CKD-EPI Creatinine Equation (2021)    Anion gap NOT CALCULATED 5 - 15    Comment: Performed at [facility], [address] [place], [place] [address]  Lipase, blood     Status: None   Collection Time: 02/04/2021 11:46 AM  Result Value Ref Range   Lipase 19 11 - 51 U/L    Comment: Performed at [facility], [address] [place], [place] [address]  CBC with Differential     Status: Abnormal   Collection Time: 02/02/2021 11:46 AM  Result Value Ref Range   WBC 0.8 (LL) 4.0 - 10.5 K/uL    Comment: REPEATED TO VERIFY WHITE COUNT CONFIRMED ON SMEAR THIS CRITICAL RESULT HAS VERIFIED AND BEEN CALLED TO G,WILSON BY AISHA MOHAMED ON 03 13 2022 AT 1234, AND HAS BEEN READ BACK.     RBC 4.54 4.22 - 5.81 MIL/uL   Hemoglobin 14.5 13.0 - 17.0 g/dL   HCT 78.4 69.6 - 29.5  %   MCV 103.1 (H) 80.0 - 100.0 fL   MCH 31.9 26.0 - 34.0 pg   MCHC 31.0 30.0 -  36.0 g/dL   RDW 16.1 09.6 - 04.5 %   Platelets 78 (L) 150 - 400 K/uL    Comment: SPECIMEN CHECKED FOR CLOTS Immature Platelet Fraction may be clinically indicated, consider ordering this additional test [id number] REPEATED TO VERIFY PLATELET COUNT CONFIRMED BY SMEAR    nRBC 0.0 0.0 - 0.2 %   Neutrophils Relative % 54 %   Neutro Abs 0.4 (LL) 1.7 - 7.7 K/uL    Comment: This critical result has verified and been called to G,WILSON by Lucia Estelle on Feb 01, 2021 at 1234, and has been read back.    Lymphocytes Relative 37 %   Lymphs Abs 0.3 (L) 0.7 - 4.0 K/uL   Monocytes Relative 9 %   Monocytes Absolute 0.1 0.1 - 1.0 K/uL   Eosinophils Relative 0 %   Eosinophils Absolute 0.0 0.0 - 0.5 K/uL   Basophils Relative 0 %   Basophils Absolute 0.0 0.0 - 0.1 K/uL   WBC Morphology MILD LEFT SHIFT (1-5% METAS, OCC MYELO, OCC BANDS)    RBC Morphology Acanthocytes present     Comment: POLYCHROMASIA PRESENT   Immature Granulocytes 0 %   Abs Immature Granulocytes 0.00 0.00 - 0.07 K/uL   Burr Cells PRESENT    Polychromasia PRESENT     Comment: Performed at [facility], [address], [place], [place] [address]  Lactic acid, plasma     Status: Abnormal   Collection Time: 02/01/2021 11:46 AM  Result Value Ref Range   Lactic Acid, Venous >11.0 (HH) 0.5 - 1.9 mmol/L    Comment: CRITICAL RESULT CALLED TO, READ BACK BY AND VERIFIED WITH: WILSON,G RN @1236  ON 02-01-21 JACKSON,K Performed at [facility], [address] [place], [place] [address]   Lactic acid, plasma     Status: Abnormal   Collection Time: 02/01/2021  1:45 PM  Result Value Ref Range   Lactic Acid, Venous >11.0 (HH) 0.5 - 1.9 mmol/L    Comment: CRITICAL VALUE NOTED.  VALUE IS CONSISTENT WITH PREVIOUSLY REPORTED AND CALLED VALUE. Performed at [facility], [address], [place], [place] [address]    Culture, blood (routine x 2)     Status: None (Preliminary result)   Collection Time: 02/01/2021  1:50 PM   Specimen: BLOOD RIGHT FOREARM  Result Value Ref Range   Specimen Description      BLOOD RIGHT FOREARM Performed at [facility], [address] [place], [place] [address]    Special Requests      BOTTLES DRAWN AEROBIC AND ANAEROBIC Blood Culture adequate volume Performed at [facility], [address], [place], [place] [address]    Culture  Setup Time      GRAM NEGATIVE RODS IN BOTH AEROBIC AND ANAEROBIC BOTTLES CRITICAL RESULT CALLED TO, READ BACK BY AND VERIFIED WITH: Spero Curb [id number] 01/18/2021 Girtha Hake Performed at [facility], [address] [place], [place] [address]    Culture GRAM NEGATIVE RODS    Report Status PENDING   Resp Panel by RT-PCR (Flu A&B, Covid) Nasopharyngeal Swab     Status: None   Collection Time: 2021/02/01  1:50 PM   Specimen: Nasopharyngeal Swab; Nasopharyngeal(NP) swabs in vial transport medium  Result Value Ref Range   SARS Coronavirus 2 by RT PCR NEGATIVE NEGATIVE    Comment: (NOTE) SARS-CoV-2 target nucleic acids are NOT DETECTED.  The SARS-CoV-2 RNA is generally detectable in upper respiratory specimens during the acute phase of  infection. The lowest concentration of SARS-CoV-2 viral copies this assay can detect is 138 copies/mL. A negative result does not preclude SARS-Cov-2 infection and should not be used as the sole basis for treatment or other patient management decisions. A negative result may occur with  improper specimen collection/handling, submission of specimen other than nasopharyngeal swab, presence of viral mutation(s) within the areas targeted by this assay, and inadequate number of viral copies(<138 copies/mL). A negative result must be combined with clinical observations, patient history, and epidemiological information. The expected result is Negative.  Fact Sheet for  Patients:  BloggerCourse.com  Fact Sheet for Healthcare Providers:  SeriousBroker.it  This test is no t yet approved or cleared by the [place] FDA and  has been authorized for detection and/or diagnosis of SARS-CoV-2 by FDA under an Emergency Use Authorization (EUA). This EUA will remain  in effect (meaning this test can be used) for the duration of the COVID-19 declaration under Section 564(b)(1) of the Act, 21 U.S.C.section 360bbb-3(b)(1), unless the authorization is terminated  or revoked sooner.       Influenza A by PCR NEGATIVE NEGATIVE   Influenza B by PCR NEGATIVE NEGATIVE    Comment: (NOTE) The Xpert Xpress SARS-CoV-2/FLU/RSV plus assay is intended as an aid in the diagnosis of influenza from Nasopharyngeal swab specimens and should not be used as a sole basis for treatment. Nasal washings and aspirates are unacceptable for Xpert Xpress SARS-CoV-2/FLU/RSV testing.  Fact Sheet for Patients: BloggerCourse.com  Fact Sheet for Healthcare Providers: SeriousBroker.it  This test is not yet approved or cleared by the [place] FDA and has been authorized for detection and/or diagnosis of SARS-CoV-2 by FDA under an Emergency Use Authorization (EUA). This EUA will remain in effect (meaning this test can be used) for the duration of the COVID-19 declaration under Section 564(b)(1) of the Act, 21 U.S.C. section 360bbb-3(b)(1), unless the authorization is terminated or revoked.  Performed at [facility], [address], [place], [place] [address]   Blood Culture ID Panel (Reflexed)     Status: Abnormal   Collection Time: 2021-02-10  1:50 PM  Result Value Ref Range   Enterococcus faecalis NOT DETECTED NOT DETECTED   Enterococcus Faecium NOT DETECTED NOT DETECTED   Listeria monocytogenes NOT DETECTED NOT DETECTED   Staphylococcus species NOT DETECTED  NOT DETECTED   Staphylococcus aureus (BCID) NOT DETECTED NOT DETECTED   Staphylococcus epidermidis NOT DETECTED NOT DETECTED   Staphylococcus lugdunensis NOT DETECTED NOT DETECTED   Streptococcus species NOT DETECTED NOT DETECTED   Streptococcus agalactiae NOT DETECTED NOT DETECTED   Streptococcus pneumoniae NOT DETECTED NOT DETECTED   Streptococcus pyogenes NOT DETECTED NOT DETECTED   A.calcoaceticus-baumannii NOT DETECTED NOT DETECTED   Bacteroides fragilis NOT DETECTED NOT DETECTED   Enterobacterales DETECTED (A) NOT DETECTED    Comment: Enterobacterales represent a large order of gram negative bacteria, not a single organism. CRITICAL RESULT CALLED TO, READ BACK BY AND VERIFIED WITH: E. JACKSON,PHARMD 0315 01/28/2021 T. TYSOR    Enterobacter cloacae complex NOT DETECTED NOT DETECTED   Escherichia coli DETECTED (A) NOT DETECTED    Comment: CRITICAL RESULT CALLED TO, READ BACK BY AND VERIFIED WITH: E. JACKSON,PHARMD 0315 01/10/2021 T. TYSOR    Klebsiella aerogenes NOT DETECTED NOT DETECTED   Klebsiella oxytoca NOT DETECTED NOT DETECTED   Klebsiella pneumoniae NOT DETECTED NOT DETECTED   Proteus species NOT DETECTED NOT DETECTED   Salmonella species NOT DETECTED NOT DETECTED   Serratia marcescens NOT DETECTED NOT  DETECTED   Haemophilus influenzae NOT DETECTED NOT DETECTED   Neisseria meningitidis NOT DETECTED NOT DETECTED   Pseudomonas aeruginosa NOT DETECTED NOT DETECTED   Stenotrophomonas maltophilia NOT DETECTED NOT DETECTED   Candida albicans NOT DETECTED NOT DETECTED   Candida auris NOT DETECTED NOT DETECTED   Candida glabrata NOT DETECTED NOT DETECTED   Candida krusei NOT DETECTED NOT DETECTED   Candida parapsilosis NOT DETECTED NOT DETECTED   Candida tropicalis NOT DETECTED NOT DETECTED   Cryptococcus neoformans/gattii NOT DETECTED NOT DETECTED   CTX-M ESBL NOT DETECTED NOT DETECTED   Carbapenem resistance IMP NOT DETECTED NOT DETECTED   Carbapenem resistance KPC NOT  DETECTED NOT DETECTED   Carbapenem resistance NDM NOT DETECTED NOT DETECTED   Carbapenem resist OXA 48 LIKE NOT DETECTED NOT DETECTED   Carbapenem resistance VIM NOT DETECTED NOT DETECTED    Comment: Performed at [facility], [address], [place], [place] [address]  Culture, blood (routine x 2)     Status: None (Preliminary result)   Collection Time: 02-18-21  1:55 PM   Specimen: BLOOD LEFT FOREARM  Result Value Ref Range   Specimen Description      BLOOD LEFT FOREARM Performed at [facility], [address] [place], [place] [address]    Special Requests      BOTTLES DRAWN AEROBIC AND ANAEROBIC Blood Culture adequate volume Performed at [facility], [address] [place], [place] [address]    Culture  Setup Time      GRAM NEGATIVE RODS IN BOTH AEROBIC AND ANAEROBIC BOTTLES CRITICAL VALUE NOTED.  VALUE IS CONSISTENT WITH PREVIOUSLY REPORTED AND CALLED VALUE. Performed at [facility], [address], [place], [place] [address]    Culture GRAM NEGATIVE RODS    Report Status PENDING   Type and screen [facility]     Status: None (Preliminary result)   Collection Time: 02/18/21  2:51 PM  Result Value Ref Range   ABO/RH(D) O POS    Antibody Screen NEG    Sample Expiration 01/22/2021,2359    Unit Number [id number]    Blood Component Type RED CELLS,LR    Unit division 00    Status of Unit ALLOCATED    Transfusion Status OK TO TRANSFUSE    Crossmatch Result Compatible    Unit Number [id number]    Blood Component Type RED CELLS,LR    Unit division 00    Status of Unit ALLOCATED    Transfusion Status OK TO TRANSFUSE    Crossmatch Result      Compatible Performed at [facility], [address] [place], [place] [address]   I-STAT 7, (LYTES, BLD GAS, ICA, H+H)     Status: Abnormal   Collection Time: 02/18/21  3:10 PM  Result Value Ref Range   pH, Arterial 6.931 (LL) 7.350 -  7.450   pCO2 arterial 56.9 (H) 32.0 - 48.0 mmHg   pO2, Arterial 433 (H) 83.0 - 108.0 mmHg   Bicarbonate 12.0 (L) 20.0 - 28.0 mmol/L   TCO2 14 (L) 22 - 32 mmol/L   O2 Saturation 100.0 %   Acid-base deficit 20.0 (H) 0.0 - 2.0 mmol/L   Sodium 140 135 - 145 mmol/L   Potassium 4.2 3.5 - 5.1 mmol/L   Calcium, Ion 0.93 (L) 1.15 - 1.40 mmol/L   HCT 35.0 (L) 39.0 - 52.0 %   Hemoglobin 11.9 (L) 13.0 - 17.0 g/dL   Sample type ARTERIAL  I-STAT 7, (LYTES, BLD GAS, ICA, H+H)     Status: Abnormal   Collection Time: 01/18/2021  3:39 PM  Result Value Ref Range   pH, Arterial 7.011 (LL) 7.350 - 7.450   pCO2 arterial 35.9 32.0 - 48.0 mmHg   pO2, Arterial 379 (H) 83.0 - 108.0 mmHg   Bicarbonate 9.9 (L) 20.0 - 28.0 mmol/L   TCO2 11 (L) 22 - 32 mmol/L   O2 Saturation 100.0 %   Acid-base deficit 22.0 (H) 0.0 - 2.0 mmol/L   Sodium 139 135 - 145 mmol/L   Potassium 3.5 3.5 - 5.1 mmol/L   Calcium, Ion 1.10 (L) 1.15 - 1.40 mmol/L   HCT 34.0 (L) 39.0 - 52.0 %   Hemoglobin 11.6 (L) 13.0 - 17.0 g/dL   Patient temperature 94.4 C    Sample type ARTERIAL   I-STAT, chem 8     Status: Abnormal   Collection Time: 01/12/2021  4:04 PM  Result Value Ref Range   Sodium 143 135 - 145 mmol/L   Potassium 4.0 3.5 - 5.1 mmol/L   Chloride 102 98 - 111 mmol/L   BUN 39 (H) 6 - 20 mg/dL   Creatinine, Ser 9.67 (H) 0.61 - 1.24 mg/dL   Glucose, Bld 57 (L) 70 - 99 mg/dL    Comment: Glucose reference range applies only to samples taken after fasting for at least 8 hours.   Calcium, Ion 1.15 1.15 - 1.40 mmol/L   TCO2 20 (L) 22 - 32 mmol/L   Hemoglobin 10.2 (L) 13.0 - 17.0 g/dL   HCT 59.1 (L) 63.8 - 46.6 %  Prepare RBC (crossmatch)     Status: None   Collection Time: 02/02/2021  4:42 PM  Result Value Ref Range   Order Confirmation      ORDER PROCESSED BY BLOOD BANK Performed at [facility], [address], [place], [place] [address]   MRSA PCR Screening     Status: None   Collection Time: 01/09/2021  6:27 PM    Specimen: Nasal Mucosa; Nasopharyngeal  Result Value Ref Range   MRSA by PCR NEGATIVE NEGATIVE    Comment:        The GeneXpert MRSA Assay (FDA approved for NASAL specimens only), is one component of a comprehensive MRSA colonization surveillance program. It is not intended to diagnose MRSA infection nor to guide or monitor treatment for MRSA infections. Performed at [facility], [address], [place], [place] [address]   Blood gas, arterial     Status: Abnormal   Collection Time: 02/04/2021  6:30 PM  Result Value Ref Range   pH, Arterial 6.993 (LL) 7.350 - 7.450    Comment: CRITICAL RESULT CALLED TO, READ BACK BY AND VERIFIED WITH: ETHAN POLLET RN 02/03/2021 @ 1915 BY P.HENDERSON    pCO2 arterial 41.8 32.0 - 48.0 mmHg   pO2, Arterial 422 (H) 83.0 - 108.0 mmHg   Bicarbonate 9.6 (L) 20.0 - 28.0 mmol/L   Acid-base deficit 20.7 (H) 0.0 - 2.0 mmol/L   O2 Saturation 99.4 %   Patient temperature 98.6     Comment: Performed at [facility], [address], [place], [place] [address]  Glucose, capillary     Status: Abnormal   Collection Time: 01/31/2021  7:58 PM  Result Value Ref Range   Glucose-Capillary 115 (H) 70 - 99 mg/dL    Comment: Glucose reference range applies only to samples taken after fasting for at least 8 hours.  Blood  gas, arterial     Status: Abnormal   Collection Time: 2021-01-24  8:00 PM  Result Value Ref Range   FIO2 100.00    Mode PRESSURE REGULATED VOLUME CONTROL    VT 560 mL   LHR 30.0 resp/min   Peep/cpap 5.0 cm H20   pH, Arterial 7.124 (LL) 7.350 - 7.450    Comment: CRITICAL RESULT CALLED TO, READ BACK BY AND VERIFIED WITH: WATSON,M 01/24/2021 @2031  BY SEEL,M    pCO2 arterial 33.2 32.0 - 48.0 mmHg   pO2, Arterial 375 (H) 83.0 - 108.0 mmHg   Bicarbonate 10.4 (L) 20.0 - 28.0 mmol/L   Acid-base deficit 17.6 (H) 0.0 - 2.0 mmol/L   O2 Saturation 99.2 %   Patient temperature 98.6    Collection site A-LINE    Allens test  (pass/fail) PASS PASS    Comment: Performed at [facility], [address] [place], [place]  Urinalysis, Routine w reflex microscopic     Status: Abnormal   Collection Time: 01-24-21  8:10 PM  Result Value Ref Range   Color, Urine YELLOW YELLOW   APPearance HAZY (A) CLEAR   Specific Gravity, Urine 1.011 1.005 - 1.030   pH 5.0 5.0 - 8.0   Glucose, UA 50 (A) NEGATIVE mg/dL   Hgb urine dipstick LARGE (A) NEGATIVE   Bilirubin Urine NEGATIVE NEGATIVE   Ketones, ur NEGATIVE NEGATIVE mg/dL   Protein, ur 01/21/21 (A) NEGATIVE mg/dL   Nitrite NEGATIVE NEGATIVE   Leukocytes,Ua NEGATIVE NEGATIVE   RBC / HPF 11-20 0 - 5 RBC/hpf   WBC, UA 0-5 0 - 5 WBC/hpf   Bacteria, UA RARE (A) NONE SEEN   Squamous Epithelial / LPF 0-5 0 - 5   Mucus PRESENT    Hyaline Casts, UA PRESENT     Comment: Performed at [facility], [address], [place], [place]  Protime-INR     Status: Abnormal   Collection Time: 01/24/21  8:10 PM  Result Value Ref Range   Prothrombin Time 34.1 (H) 11.4 - 15.2 seconds   INR 3.5 (H) 0.8 - 1.2    Comment: (NOTE) INR goal varies based on device and disease states. Performed at [facility], [address], [place], [place]   APTT     Status: Abnormal   Collection Time: Jan 24, 2021  8:10 PM  Result Value Ref Range   aPTT 53 (H) 24 - 36 seconds    Comment:        IF BASELINE aPTT IS ELEVATED, SUGGEST PATIENT RISK ASSESSMENT BE USED TO DETERMINE APPROPRIATE ANTICOAGULANT THERAPY. Performed at [facility], [address], [place], [place]   Fibrinogen     Status: Abnormal   Collection Time: 2021-01-24  8:10 PM  Result Value Ref Range   Fibrinogen 163 (L) 210 - 475 mg/dL    Comment: Performed at [facility], [address], [place], [place]  CBC     Status: Abnormal   Collection Time: 01/24/21  8:10 PM  Result Value Ref Range   WBC  0.5 (LL) 4.0 - 10.5 K/uL    Comment: This critical result has verified and been called to [facility] by SEEL,MOLLY on 03 13 2022 at 2156, and has been read back.    RBC 2.97 (L) 4.22 - 5.81 MIL/uL   Hemoglobin 9.5 (L) 13.0 - 17.0 g/dL   HCT 2157 (L) 88.2 - 80.0 %   MCV  101.3 (H) 80.0 - 100.0 fL   MCH 32.0 26.0 - 34.0 pg   MCHC 31.6 30.0 - 36.0 g/dL   RDW 16.1 09.6 - 04.5 %   Platelets 50 (L) 150 - 400 K/uL    Comment: Immature Platelet Fraction may be clinically indicated, consider ordering this additional test [id number]    nRBC 0.0 0.0 - 0.2 %    Comment: Performed at [facility], [address], [place], [place] [address]  Lactic acid, plasma     Status: Abnormal   Collection Time: 2021-01-24  8:10 PM  Result Value Ref Range   Lactic Acid, Venous >11.0 (HH) 0.5 - 1.9 mmol/L    Comment: CRITICAL RESULT CALLED TO, READ BACK BY AND VERIFIED WITH: HUI PENG RN 01/24/2021 @2125  BY P.HENDERSON Performed at [facility], [address], [place], [place] [address]   Glucose, capillary     Status: None   Collection Time: 01/24/2021 11:22 PM  Result Value Ref Range   Glucose-Capillary 75 70 - 99 mg/dL    Comment: Glucose reference range applies only to samples taken after fasting for at least 8 hours.  Blood gas, arterial     Status: Abnormal   Collection Time: 01/12/2021 12:32 AM  Result Value Ref Range   FIO2 60.00    O2 Content 98.0 L/min   Mode PRESSURE REGULATED VOLUME CONTROL    VT 580 mL   LHR 35 resp/min   Peep/cpap 5.0 cm H20   pH, Arterial 7.139 (LL) 7.350 - 7.450    Comment: CRITICAL RESULT CALLED TO, READ BACK BY AND VERIFIED WITH: JENNA BURDICK RN 02/06/2021 @0050  BY P.HENDERSON    pCO2 arterial 31.6 (L) 32.0 - 48.0 mmHg   pO2, Arterial 207 (H) 83.0 - 108.0 mmHg   Bicarbonate 10.3 (L) 20.0 - 28.0 mmol/L   Acid-base deficit 17.4 (H) 0.0 - 2.0 mmol/L   O2 Saturation 98.6 %   Patient temperature 98.6    Collection site A-LINE    Allens  test (pass/fail) PASS PASS    Comment: Performed at [facility], [address] [place], [place] [address]  Glucose, capillary     Status: Abnormal   Collection Time: 01/16/2021  2:12 AM  Result Value Ref Range   Glucose-Capillary 36 (LL) 70 - 99 mg/dL    Comment: Glucose reference range applies only to samples taken after fasting for at least 8 hours.   Comment 1 Notify RN    Comment 2 Document in Chart    Comment 3 Repeat Test   Glucose, capillary     Status: Abnormal   Collection Time: 01/15/2021  2:15 AM  Result Value Ref Range   Glucose-Capillary 37 (LL) 70 - 99 mg/dL    Comment: Glucose reference range applies only to samples taken after fasting for at least 8 hours.  Glucose, capillary     Status: Abnormal   Collection Time: 01/13/2021  2:39 AM  Result Value Ref Range   Glucose-Capillary 120 (H) 70 - 99 mg/dL    Comment: Glucose reference range applies only to samples taken after fasting for at least 8 hours.  Protime-INR     Status: Abnormal   Collection Time: 01/27/2021  2:42 AM  Result Value Ref Range   Prothrombin Time 36.4 (H) 11.4 - 15.2 seconds   INR 3.8 (H) 0.8 - 1.2    Comment: (NOTE) INR goal varies based on device and disease states. Performed at [facility],  [address], [place], [place] [address]   APTT     Status: Abnormal   Collection Time: 02/13/21  2:42 AM  Result Value Ref Range   aPTT 83 (H) 24 - 36 seconds    Comment:        IF BASELINE aPTT IS ELEVATED, SUGGEST PATIENT RISK ASSESSMENT BE USED TO DETERMINE APPROPRIATE ANTICOAGULANT THERAPY. Performed at [facility], [address] [place], [place] [address]   D-dimer, quantitative     Status: Abnormal   Collection Time: 02/13/2021  2:42 AM  Result Value Ref Range   D-Dimer, Quant >20.00 (H) 0.00 - 0.50 ug/mL-FEU    Comment: (NOTE) At the manufacturer cut-off value of 0.5 g/mL FEU, this assay has a negative predictive value of 95-100%.This  assay is intended for use in conjunction with a clinical pretest probability (PTP) assessment model to exclude pulmonary embolism (PE) and deep venous thrombosis (DVT) in outpatients suspected of PE or DVT. Results should be correlated with clinical presentation. Performed at [facility], [address], [place], [place] [address]   Fibrinogen     Status: Abnormal   Collection Time: 2021-02-13  2:42 AM  Result Value Ref Range   Fibrinogen 146 (L) 210 - 475 mg/dL    Comment: Performed at [facility], [address], [place], [place] [address]  Glucose, capillary     Status: Abnormal   Collection Time: 02-13-2021  3:49 AM  Result Value Ref Range   Glucose-Capillary 69 (L) 70 - 99 mg/dL    Comment: Glucose reference range applies only to samples taken after fasting for at least 8 hours.  Blood gas, arterial     Status: Abnormal   Collection Time: 02/13/21  4:00 AM  Result Value Ref Range   FIO2 60.00    O2 Content 97.0 L/min   Mode PRESSURE REGULATED VOLUME CONTROL    VT 580 mL   LHR 35 resp/min   Peep/cpap 5.0 cm H20   pH, Arterial 7.270 (L) 7.350 - 7.450   pCO2 arterial 31.4 (L) 32.0 - 48.0 mmHg   pO2, Arterial 172 (H) 83.0 - 108.0 mmHg   Bicarbonate 13.9 (L) 20.0 - 28.0 mmol/L   Acid-base deficit 11.6 (H) 0.0 - 2.0 mmol/L   O2 Saturation 98.8 %   Patient temperature 98.6    Collection site A-LINE    Allens test (pass/fail) PASS PASS    Comment: Performed at [facility], [address] [place], [place] [address]  Glucose, capillary     Status: Abnormal   Collection Time: 13-Feb-2021  4:17 AM  Result Value Ref Range   Glucose-Capillary 124 (H) 70 - 99 mg/dL    Comment: Glucose reference range applies only to samples taken after fasting for at least 8 hours.  Renal function panel (daily at 0500)     Status: Abnormal   Collection Time: 02/13/21  5:34 AM  Result Value Ref Range   Sodium 151 (H) 135 - 145 mmol/L    Comment:  DELTA CHECK NOTED REPEATED TO VERIFY    Potassium 4.2 3.5 - 5.1 mmol/L   Chloride 96 (L) 98 - 111 mmol/L    Comment: REPEATED TO VERIFY   CO2 13 (L) 22 - 32 mmol/L    Comment: REPEATED TO VERIFY   Glucose, Bld 81 70 - 99 mg/dL    Comment: Glucose reference range applies only to samples taken after fasting for at least 8 hours.  BUN 26 (H) 6 - 20 mg/dL   Creatinine, Ser 7.56 (H) 0.61 - 1.24 mg/dL   Calcium 6.9 (L) 8.9 - 10.3 mg/dL   Phosphorus 7.1 (H) 2.5 - 4.6 mg/dL   Albumin 1.5 (L) 3.5 - 5.0 g/dL   GFR, Estimated 25 (L) >60 mL/min    Comment: (NOTE) Calculated using the CKD-EPI Creatinine Equation (2021)    Anion gap 42 (H) 5 - 15    Comment: REPEATED TO VERIFY Performed at [facility], [address], [place], [place] [address]   Magnesium     Status: None   Collection Time: 2021-02-13  5:37 AM  Result Value Ref Range   Magnesium 2.0 1.7 - 2.4 mg/dL    Comment: Performed at [facility], [address] [place], [place] [address]  APTT     Status: Abnormal   Collection Time: 2021/02/13  5:37 AM  Result Value Ref Range   aPTT 59 (H) 24 - 36 seconds    Comment:        IF BASELINE aPTT IS ELEVATED, SUGGEST PATIENT RISK ASSESSMENT BE USED TO DETERMINE APPROPRIATE ANTICOAGULANT THERAPY. Performed at [facility], [address] [place], [place] [address]   Hepatic function panel     Status: Abnormal   Collection Time: 2021-02-13  5:37 AM  Result Value Ref Range   Total Protein 3.6 (L) 6.5 - 8.1 g/dL   Albumin 1.5 (L) 3.5 - 5.0 g/dL   AST 3,016 (H) 15 - 41 U/L   ALT 361 (H) 0 - 44 U/L   Alkaline Phosphatase 33 (L) 38 - 126 U/L   Total Bilirubin 2.7 (H) 0.3 - 1.2 mg/dL   Bilirubin, Direct 1.2 (H) 0.0 - 0.2 mg/dL   Indirect Bilirubin 1.5 (H) 0.3 - 0.9 mg/dL    Comment: Performed at [facility], [address], [place], [place] [address]    Imaging / Studies: CT ABDOMEN PELVIS WO  CONTRAST  Result Date: 01/09/2021 CLINICAL DATA:  Abdominal pain elevated lactic facet. Acute renal failure. EXAM: CT ABDOMEN AND PELVIS WITHOUT CONTRAST TECHNIQUE: Multidetector CT imaging of the abdomen and pelvis was performed following the standard protocol without IV contrast. COMPARISON:  CT scan 08/11/2017 FINDINGS: Lower chest: The lung bases are clear of acute process. No pleural effusion or pulmonary lesions. The heart is normal in size. No pericardial effusion. The distal esophagus and aorta are unremarkable. Hepatobiliary: Stable cirrhotic changes involving the liver. No obvious hepatic lesions without contrast. No intrahepatic biliary dilatation. Gallstones are noted the gallbladder. No common bile duct dilatation. Pancreas: No mass, inflammation or ductal dilatation. Spleen: Normal size.  No focal lesions. Adrenals/Urinary Tract: Adrenal glands and kidneys are unremarkable except for bilateral renal calculi. No obstructing ureteral calculi or hydroureteronephrosis. The bladder is decompressed by Foley catheter but does contain high attenuation material which could be hemorrhage/clot. Stomach/Bowel: The stomach, duodenum, small bowel and colon demonstrate diffuse wall thickening. Although this could be enterocolitis it also may be due to the patient's cirrhosis and low albumin. No findings for obstruction or perforation. Vascular/Lymphatic: Moderate atherosclerotic calcifications involving the aorta and iliac arteries and branch vessels but no aneurysm. No mesenteric or retroperitoneal mass or adenopathy. Upper abdominal lymph nodes typical with cirrhosis. Reproductive: The prostate gland and seminal vesicles are unremarkable. Other: Mesenteric edema and a small amount of free abdominal and pelvic fluid likely related to the patient's cirrhosis. No free air. Musculoskeletal: No significant bony findings. IMPRESSION: 1. Stable  cirrhotic changes involving the liver. No obvious hepatic lesions without  contrast. 2. Cholelithiasis. 3. Bilateral renal calculi but no obstructing ureteral calculi or hydroureteronephrosis. 4. Diffuse bowel wall thickening could be due to the patient's cirrhosis and low albumin. 5. High attenuation material in the bladder could be hemorrhage/clot. Aortic Atherosclerosis (ICD10-I70.0). Electronically Signed   By: Rudie Meyer M.D.   On: February 08, 2021 13:25   DG Chest 1 View  Result Date: Feb 08, 2021 CLINICAL DATA:  Central line placement EXAM: CHEST  1 VIEW COMPARISON:  02/08/2021 FINDINGS: Single frontal view of the chest demonstrates endotracheal tube overlying tracheal air column tip just below thoracic inlet. Enteric catheter passes below diaphragm tip overlies gastric fundus. Left internal jugular catheter tip overlies the brachiocephalic confluence. The cardiac silhouette is stable. No acute airspace disease, effusion, or pneumothorax. There are no acute bony abnormalities. IMPRESSION: 1. Unremarkable support devices as above. 2. No acute intrathoracic process. Electronically Signed   By: Sharlet Salina M.D.   On: 2021/02/08 19:04   DG Abd 1 View  Result Date: 2021-02-08 CLINICAL DATA:  Enteric catheter placement EXAM: ABDOMEN - 1 VIEW COMPARISON:  2021/02/08 FINDINGS: Frontal view of the lower chest and upper abdomen demonstrates enteric catheter tip and side port projecting over the gastric body. Surgical drain right mid abdomen. Unremarkable bowel gas pattern. Lung bases are clear. IMPRESSION: 1. Enteric catheter overlying gastric body. Electronically Signed   By: Sharlet Salina M.D.   On: 02/08/21 19:06   DG CHEST PORT 1 VIEW  Result Date: 01/11/2021 CLINICAL DATA:  Central line placement EXAM: PORTABLE CHEST 1 VIEW COMPARISON:  02-08-2021 FINDINGS: Endotracheal tube tip is about 6 cm superior to the carina. Right IJ central venous catheter tip over the proximal right atrium. Left IJ central venous catheter tip over the brachial cephalic confluence. Esophageal tube  tip below the diaphragm but incompletely visualized. No focal airspace disease, pleural effusion or pneumothorax. Stable cardiomediastinal silhouette. IMPRESSION: 1. Left IJ central venous catheter tip projects over the brachiocephalic confluence. Right IJ central venous catheter tip over the proximal right atrium. No pneumothorax. 2. Endotracheal tube tip about 6 cm superior to carina 3. Lungs are clear. Electronically Signed   By: Jasmine Pang M.D.   On: 02/05/2021 01:42   DG CHEST PORT 1 VIEW  Result Date: Feb 08, 2021 CLINICAL DATA:  Central line placement EXAM: PORTABLE CHEST 1 VIEW COMPARISON:  02/08/2021 FINDINGS: Single frontal view of the chest demonstrates stable endotracheal tube and enteric catheter. Left internal jugular catheter unchanged tip overlying brachiocephalic confluence. New right internal jugular catheter tip overlying the superior vena cava. Cardiac silhouette is unremarkable. No airspace disease, effusion, or pneumothorax. No acute bony abnormalities. IMPRESSION: 1. No complication after right internal jugular catheter placement. 2. Otherwise stable support devices. Electronically Signed   By: Sharlet Salina M.D.   On: 02-08-21 21:55   DG Chest Port 1 View  Result Date: 02/08/21 CLINICAL DATA:  Sepsis, abdominal pain EXAM: PORTABLE CHEST 1 VIEW COMPARISON:  08/04/2017 FINDINGS: The heart size and mediastinal contours are within normal limits. Scattered atherosclerotic calcification of the aortic knob. No focal airspace consolidation, pleural effusion, or pneumothorax. The visualized skeletal structures are unremarkable. IMPRESSION: No active disease. Electronically Signed   By: Duanne Guess D.O.   On: Feb 08, 2021 13:16    Medications / Allergies: per chart  Antibiotics: Anti-infectives (From admission, onward)   Start     Dose/Rate Route Frequency Ordered Stop   01/11/2021 0600  cefoTEtan (CEFOTAN) 2 g in sodium  chloride 0.9 % 100 mL IVPB  Status:  Discontinued        2  g 200 mL/hr over 30 Minutes Intravenous On call to O.R. 10/07/2021 1437 10/07/2021 1805   01/29/2021 0330  piperacillin-tazobactam (ZOSYN) IVPB 3.375 g        3.375 g 100 mL/hr over 30 Minutes Intravenous Every 6 hours 01/13/2021 0242     10/07/2021 2000  piperacillin-tazobactam (ZOSYN) IVPB 3.375 g  Status:  Discontinued        3.375 g 12.5 mL/hr over 240 Minutes Intravenous Every 8 hours 10/07/2021 1900 01/29/2021 0242   10/07/2021 1300  ceFEPIme (MAXIPIME) 2 g in sodium chloride 0.9 % 100 mL IVPB        2 g 200 mL/hr over 30 Minutes Intravenous  Once 10/07/2021 1247 10/07/2021 1452   10/07/2021 1300  metroNIDAZOLE (FLAGYL) IVPB 500 mg        500 mg 100 mL/hr over 60 Minutes Intravenous  Once 10/07/2021 1247 10/07/2021 1554   10/07/2021 1300  vancomycin (VANCOCIN) IVPB 1000 mg/200 mL premix        1,000 mg 200 mL/hr over 60 Minutes Intravenous  Once 10/07/2021 1247 10/07/2021 1615        Note: Portions of this report may have been transcribed using voice recognition software. Every effort was made to ensure accuracy; however, inadvertent computerized transcription errors may be present.   Any transcriptional errors that result from this process are unintentional.    Ardeth SportsmanSteven C. Coreon Simkins, MD, FACS, MASCRS  Gastrointestinal and Minimally Invasive Surgery  [facility] [address] [place], [place] [id number] [phone] Fax [phone] Main/Paging  CONTACT INFORMATION: Weekday (9AM-5PM) concerns: Call [facility] main office at [phone] Weeknight (5PM-9AM) or Weekend/Holiday concerns: Check [email or web address] for General Surgery [facility] coverage (Please, do not use SecureChat as it is not reliable communication to operating surgeons for immediate patient care)      01/15/2021  7:45 AM

## 2021-02-07 NOTE — Death Summary Note (Signed)
DEATH SUMMARY   Patient Details  Name: Cameron Coffey MRN: [id number] DOB: 10-Jan-1961  Admission/Discharge Information   Admit Date:  2021-01-23  Date of Death: Date of Death: 2021/01/24  Time of Death: Time of Death: 1300  Length of Stay: 1  Referring Physician: Franciso Bend, NP   Reason(s) for Hospitalization  renal failure, perforated bowel  Diagnoses  Preliminary cause of death: septic shock Secondary Diagnoses (including complications and co-morbidities):  Active Problems:   Cirrhosis of liver 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)   Chronic hepatitis C without hepatic coma (HCC)   Thrombocytopenia (HCC)   Leukopenia   Lactic acidosis   Acute renal failure (ARF) (HCC)   Mesenteric ischemia [facility])   Brief Hospital Course (including significant findings, care, treatment, and services provided and events leading to death)  Cameron Coffey is a 60 y.o. year old male who presented with abdominal pain and no UOP for a day or two. In renal failure, Cr up. CT revealed free air and bowel ischemia. Ex lap with perforated bowel s/p partial resection. Remained intubated after procedure. Progressive hypotension refractory to pressors. Renal failure worsened. Placed on CRRT.  Rising lactic acidosis as not clearing with underlying cirrhosis. Bps not sustainable for life despite multiple pressors. Called sister (next of kin) and relayed above. She said she would not want to live on machines especially if could not regain total independence. In light of this I recommended comfort care to which she agreed. Family was able to visit. Support devices withdrawn and patient passed. Patient was in custody of [facility]. Case referred to medical examiner.    Pertinent Labs and Studies  Significant Diagnostic Studies CT ABDOMEN PELVIS WO CONTRAST  Result Date: 2021/01/23 CLINICAL DATA:  Abdominal pain elevated lactic facet. Acute renal failure. EXAM: CT ABDOMEN AND PELVIS WITHOUT CONTRAST TECHNIQUE:  Multidetector CT imaging of the abdomen and pelvis was performed following the standard protocol without IV contrast. COMPARISON:  CT scan 08/11/2017 FINDINGS: Lower chest: The lung bases are clear of acute process. No pleural effusion or pulmonary lesions. The heart is normal in size. No pericardial effusion. The distal esophagus and aorta are unremarkable. Hepatobiliary: Stable cirrhotic changes involving the liver. No obvious hepatic lesions without contrast. No intrahepatic biliary dilatation. Gallstones are noted the gallbladder. No common bile duct dilatation. Pancreas: No mass, inflammation or ductal dilatation. Spleen: Normal size.  No focal lesions. Adrenals/Urinary Tract: Adrenal glands and kidneys are unremarkable except for bilateral renal calculi. No obstructing ureteral calculi or hydroureteronephrosis. The bladder is decompressed by Foley catheter but does contain high attenuation material which could be hemorrhage/clot. Stomach/Bowel: The stomach, duodenum, small bowel and colon demonstrate diffuse wall thickening. Although this could be enterocolitis it also may be due to the patient's cirrhosis and low albumin. No findings for obstruction or perforation. Vascular/Lymphatic: Moderate atherosclerotic calcifications involving the aorta and iliac arteries and branch vessels but no aneurysm. No mesenteric or retroperitoneal mass or adenopathy. Upper abdominal lymph nodes typical with cirrhosis. Reproductive: The prostate gland and seminal vesicles are unremarkable. Other: Mesenteric edema and a small amount of free abdominal and pelvic fluid likely related to the patient's cirrhosis. No free air. Musculoskeletal: No significant bony findings. IMPRESSION: 1. Stable cirrhotic changes involving the liver. No obvious hepatic lesions without contrast. 2. Cholelithiasis. 3. Bilateral renal calculi but no obstructing ureteral calculi or hydroureteronephrosis. 4. Diffuse bowel wall thickening could be due to  the patient's cirrhosis and low albumin. 5. High attenuation material in the bladder could  be hemorrhage/clot. Aortic Atherosclerosis (ICD10-I70.0). Electronically Signed   By: Rudie Meyer M.D.   On: 01/18/2021 13:25   DG Chest 1 View  Result Date: 01/13/2021 CLINICAL DATA:  Central line placement EXAM: CHEST  1 VIEW COMPARISON:  01/08/2021 FINDINGS: Single frontal view of the chest demonstrates endotracheal tube overlying tracheal air column tip just below thoracic inlet. Enteric catheter passes below diaphragm tip overlies gastric fundus. Left internal jugular catheter tip overlies the brachiocephalic confluence. The cardiac silhouette is stable. No acute airspace disease, effusion, or pneumothorax. There are no acute bony abnormalities. IMPRESSION: 1. Unremarkable support devices as above. 2. No acute intrathoracic process. Electronically Signed   By: Sharlet Salina M.D.   On: 01/18/2021 19:04   DG Abd 1 View  Result Date: 01/22/2021 CLINICAL DATA:  Enteric catheter placement EXAM: ABDOMEN - 1 VIEW COMPARISON:  01/12/2021 FINDINGS: Frontal view of the lower chest and upper abdomen demonstrates enteric catheter tip and side port projecting over the gastric body. Surgical drain right mid abdomen. Unremarkable bowel gas pattern. Lung bases are clear. IMPRESSION: 1. Enteric catheter overlying gastric body. Electronically Signed   By: Sharlet Salina M.D.   On: 01/24/2021 19:06   DG CHEST PORT 1 VIEW  Result Date: Feb 02, 2021 CLINICAL DATA:  Central line placement EXAM: PORTABLE CHEST 1 VIEW COMPARISON:  01/18/2021 FINDINGS: Endotracheal tube tip is about 6 cm superior to the carina. Right IJ central venous catheter tip over the proximal right atrium. Left IJ central venous catheter tip over the brachial cephalic confluence. Esophageal tube tip below the diaphragm but incompletely visualized. No focal airspace disease, pleural effusion or pneumothorax. Stable cardiomediastinal silhouette. IMPRESSION: 1.  Left IJ central venous catheter tip projects over the brachiocephalic confluence. Right IJ central venous catheter tip over the proximal right atrium. No pneumothorax. 2. Endotracheal tube tip about 6 cm superior to carina 3. Lungs are clear. Electronically Signed   By: Jasmine Pang M.D.   On: 02/02/2021 01:42   DG CHEST PORT 1 VIEW  Result Date: 02/04/2021 CLINICAL DATA:  Central line placement EXAM: PORTABLE CHEST 1 VIEW COMPARISON:  01/21/2021 FINDINGS: Single frontal view of the chest demonstrates stable endotracheal tube and enteric catheter. Left internal jugular catheter unchanged tip overlying brachiocephalic confluence. New right internal jugular catheter tip overlying the superior vena cava. Cardiac silhouette is unremarkable. No airspace disease, effusion, or pneumothorax. No acute bony abnormalities. IMPRESSION: 1. No complication after right internal jugular catheter placement. 2. Otherwise stable support devices. Electronically Signed   By: Sharlet Salina M.D.   On: 02/05/2021 21:55   DG Chest Port 1 View  Result Date: 01/12/2021 CLINICAL DATA:  Sepsis, abdominal pain EXAM: PORTABLE CHEST 1 VIEW COMPARISON:  08/04/2017 FINDINGS: The heart size and mediastinal contours are within normal limits. Scattered atherosclerotic calcification of the aortic knob. No focal airspace consolidation, pleural effusion, or pneumothorax. The visualized skeletal structures are unremarkable. IMPRESSION: No active disease. Electronically Signed   By: Duanne Guess D.O.   On: 01/11/2021 13:16    Microbiology Recent Results (from the past 240 hour(s))  Culture, blood (routine x 2)     Status: Abnormal   Collection Time: 01/27/2021  1:50 PM   Specimen: BLOOD RIGHT FOREARM  Result Value Ref Range Status   Specimen Description   Final    BLOOD RIGHT FOREARM Performed at [facility], [address], [place], [place] [address]    Special Requests   Final    BOTTLES DRAWN AEROBIC AND  ANAEROBIC Blood Culture adequate volume Performed at [facility], [address], [place], [place] [address]    Culture  Setup Time   Final    GRAM NEGATIVE RODS IN BOTH AEROBIC AND ANAEROBIC BOTTLES CRITICAL RESULT CALLED TO, READ BACK BY AND VERIFIED WITH: Spero Curb [id number] 01/29/2021 Girtha Hake Performed at [facility], [address] [place], [place] [address]    Culture ESCHERICHIA COLI (A)  Final   Report Status 01/22/2021 FINAL  Final   Organism ID, Bacteria ESCHERICHIA COLI  Final      Susceptibility   Escherichia coli - MIC*    AMPICILLIN <=2 SENSITIVE Sensitive     CEFAZOLIN <=4 SENSITIVE Sensitive     CEFEPIME <=0.12 SENSITIVE Sensitive     CEFTAZIDIME <=1 SENSITIVE Sensitive     CEFTRIAXONE <=0.25 SENSITIVE Sensitive     CIPROFLOXACIN <=0.25 SENSITIVE Sensitive     GENTAMICIN <=1 SENSITIVE Sensitive     IMIPENEM <=0.25 SENSITIVE Sensitive     TRIMETH/SULFA <=20 SENSITIVE Sensitive     AMPICILLIN/SULBACTAM <=2 SENSITIVE Sensitive     PIP/TAZO <=4 SENSITIVE Sensitive     * ESCHERICHIA COLI  Resp Panel by RT-PCR (Flu A&B, Covid) Nasopharyngeal Swab     Status: None   Collection Time: 2021/02/11  1:50 PM   Specimen: Nasopharyngeal Swab; Nasopharyngeal(NP) swabs in vial transport medium  Result Value Ref Range Status   SARS Coronavirus 2 by RT PCR NEGATIVE NEGATIVE Final    Comment: (NOTE) SARS-CoV-2 target nucleic acids are NOT DETECTED.  The SARS-CoV-2 RNA is generally detectable in upper respiratory specimens during the acute phase of infection. The lowest concentration of SARS-CoV-2 viral copies this assay can detect is 138 copies/mL. A negative result does not preclude SARS-Cov-2 infection and should not be used as the sole basis for treatment or other patient management decisions. A negative result may occur with  improper specimen collection/handling, submission of specimen other than nasopharyngeal swab, presence of viral  mutation(s) within the areas targeted by this assay, and inadequate number of viral copies(<138 copies/mL). A negative result must be combined with clinical observations, patient history, and epidemiological information. The expected result is Negative.  Fact Sheet for Patients:  BloggerCourse.com  Fact Sheet for Healthcare Providers:  SeriousBroker.it  This test is no t yet approved or cleared by the [place] FDA and  has been authorized for detection and/or diagnosis of SARS-CoV-2 by FDA under an Emergency Use Authorization (EUA). This EUA will remain  in effect (meaning this test can be used) for the duration of the COVID-19 declaration under Section 564(b)(1) of the Act, 21 U.S.C.section 360bbb-3(b)(1), unless the authorization is terminated  or revoked sooner.       Influenza A by PCR NEGATIVE NEGATIVE Final   Influenza B by PCR NEGATIVE NEGATIVE Final    Comment: (NOTE) The Xpert Xpress SARS-CoV-2/FLU/RSV plus assay is intended as an aid in the diagnosis of influenza from Nasopharyngeal swab specimens and should not be used as a sole basis for treatment. Nasal washings and aspirates are unacceptable for Xpert Xpress SARS-CoV-2/FLU/RSV testing.  Fact Sheet for Patients: BloggerCourse.com  Fact Sheet for Healthcare Providers: SeriousBroker.it  This test is not yet approved or cleared by the [place] FDA and has been authorized for detection and/or diagnosis of SARS-CoV-2 by FDA under an Emergency Use Authorization (EUA). This EUA will remain in effect (meaning this test can be used) for the duration of the COVID-19 declaration under Section 564(b)(1) of the  Act, 21 U.S.C. section 360bbb-3(b)(1), unless the authorization is terminated or revoked.  Performed at [facility], [address], [place], [place] [address]   Blood Culture ID Panel  (Reflexed)     Status: Abnormal   Collection Time: 01-29-21  1:50 PM  Result Value Ref Range Status   Enterococcus faecalis NOT DETECTED NOT DETECTED Final   Enterococcus Faecium NOT DETECTED NOT DETECTED Final   Listeria monocytogenes NOT DETECTED NOT DETECTED Final   Staphylococcus species NOT DETECTED NOT DETECTED Final   Staphylococcus aureus (BCID) NOT DETECTED NOT DETECTED Final   Staphylococcus epidermidis NOT DETECTED NOT DETECTED Final   Staphylococcus lugdunensis NOT DETECTED NOT DETECTED Final   Streptococcus species NOT DETECTED NOT DETECTED Final   Streptococcus agalactiae NOT DETECTED NOT DETECTED Final   Streptococcus pneumoniae NOT DETECTED NOT DETECTED Final   Streptococcus pyogenes NOT DETECTED NOT DETECTED Final   A.calcoaceticus-baumannii NOT DETECTED NOT DETECTED Final   Bacteroides fragilis NOT DETECTED NOT DETECTED Final   Enterobacterales DETECTED (A) NOT DETECTED Final    Comment: Enterobacterales represent a large order of gram negative bacteria, not a single organism. CRITICAL RESULT CALLED TO, READ BACK BY AND VERIFIED WITH: E. JACKSON,PHARMD 0315 02/06/2021 T. TYSOR    Enterobacter cloacae complex NOT DETECTED NOT DETECTED Final   Escherichia coli DETECTED (A) NOT DETECTED Final    Comment: CRITICAL RESULT CALLED TO, READ BACK BY AND VERIFIED WITH: E. JACKSON,PHARMD 0315 01/28/2021 T. TYSOR    Klebsiella aerogenes NOT DETECTED NOT DETECTED Final   Klebsiella oxytoca NOT DETECTED NOT DETECTED Final   Klebsiella pneumoniae NOT DETECTED NOT DETECTED Final   Proteus species NOT DETECTED NOT DETECTED Final   Salmonella species NOT DETECTED NOT DETECTED Final   Serratia marcescens NOT DETECTED NOT DETECTED Final   Haemophilus influenzae NOT DETECTED NOT DETECTED Final   Neisseria meningitidis NOT DETECTED NOT DETECTED Final   Pseudomonas aeruginosa NOT DETECTED NOT DETECTED Final   Stenotrophomonas maltophilia NOT DETECTED NOT DETECTED Final   Candida  albicans NOT DETECTED NOT DETECTED Final   Candida auris NOT DETECTED NOT DETECTED Final   Candida glabrata NOT DETECTED NOT DETECTED Final   Candida krusei NOT DETECTED NOT DETECTED Final   Candida parapsilosis NOT DETECTED NOT DETECTED Final   Candida tropicalis NOT DETECTED NOT DETECTED Final   Cryptococcus neoformans/gattii NOT DETECTED NOT DETECTED Final   CTX-M ESBL NOT DETECTED NOT DETECTED Final   Carbapenem resistance IMP NOT DETECTED NOT DETECTED Final   Carbapenem resistance KPC NOT DETECTED NOT DETECTED Final   Carbapenem resistance NDM NOT DETECTED NOT DETECTED Final   Carbapenem resist OXA 48 LIKE NOT DETECTED NOT DETECTED Final   Carbapenem resistance VIM NOT DETECTED NOT DETECTED Final    Comment: Performed at [facility], [address] [place], [place] [address]  Culture, blood (routine x 2)     Status: Abnormal   Collection Time: 01/29/2021  1:55 PM   Specimen: BLOOD LEFT FOREARM  Result Value Ref Range Status   Specimen Description   Final    BLOOD LEFT FOREARM Performed at [facility], [address] [place], [place] [address]    Special Requests   Final    BOTTLES DRAWN AEROBIC AND ANAEROBIC Blood Culture adequate volume Performed at [facility], [address] [place], [place] [address]    Culture  Setup Time   Final    GRAM NEGATIVE RODS IN BOTH AEROBIC AND ANAEROBIC BOTTLES CRITICAL VALUE  NOTED.  VALUE IS CONSISTENT WITH PREVIOUSLY REPORTED AND CALLED VALUE.    Culture (A)  Final    ESCHERICHIA COLI SUSCEPTIBILITIES PERFORMED ON PREVIOUS CULTURE WITHIN THE LAST 5 DAYS. Performed at [facility], [address] [place], [place] [address]    Report Status 01/22/2021 FINAL  Final  MRSA PCR Screening     Status: None   Collection Time: 01/21/2021  6:27 PM   Specimen: Nasal Mucosa; Nasopharyngeal  Result Value Ref Range Status   MRSA by PCR NEGATIVE NEGATIVE Final    Comment:        The GeneXpert MRSA  Assay (FDA approved for NASAL specimens only), is one component of a comprehensive MRSA colonization surveillance program. It is not intended to diagnose MRSA infection nor to guide or monitor treatment for MRSA infections. Performed at [facility], [address] [place], [place] [address]   Urine culture     Status: None   Collection Time: 01/26/2021  8:10 PM   Specimen: Urine, Clean Catch  Result Value Ref Range Status   Specimen Description   Final    URINE, CLEAN CATCH Performed at [facility], [address] [place], [place] [address]    Special Requests   Final    NONE Performed at [facility], [address] [place], [place] [address]    Culture   Final    NO GROWTH Performed at [facility], [address] [place], [place] [address]    Report Status 01/21/2021 FINAL  Final    Lab Basic Metabolic Panel: Recent Labs  Lab 02/01/2021 1146 01/27/2021 1510 01/18/2021 1539 02/05/2021 1604 08/02/21 0534 08/02/21 0537  NA 134* 140 139 143 151*  --   K 4.2 4.2 3.5 4.0 4.2  --   CL 97*  --   --  102 96*  --   CO2 <7*  --   --   --  13*  --   GLUCOSE 113*  --   --  57* 81  --   BUN 32*  --   --  39* 26*  --   CREATININE 2.63*  --   --  2.20* 2.79*  --   CALCIUM 8.3*  --   --   --  6.9*  --   MG  --   --   --   --   --  2.0  PHOS  --   --   --   --  7.1*  --    Liver Function Tests: Recent Labs  Lab 01/28/2021 1146 08/02/21 0534 08/02/21 0537  AST 150*  --  1,574*  ALT 98*  --  361*  ALKPHOS 60  --  33*  BILITOT 2.2*  --  2.7*  PROT 7.7  --  3.6*  ALBUMIN 2.5* 1.5* 1.5*   Recent Labs  Lab 01/18/2021 1146  LIPASE 19   No results for input(s): AMMONIA in the last 168 hours. CBC: Recent Labs  Lab 01/23/2021 1146 01/27/2021 1510 01/26/2021 1539 02/05/2021 1604 01/22/2021 2010 08/02/21 0537  WBC 0.8*  --   --   --  0.5* 1.3*  NEUTROABS 0.4*  --   --   --   --   --   HGB 14.5 11.9* 11.6* 10.2* 9.5*  9.3*  HCT 46.8 35.0* 34.0* 30.0* 30.1* 28.7*  MCV 103.1*  --   --   --  101.3* 99.7  PLT 78*  --   --   --  50* 36*   Cardiac Enzymes: No results for input(s): CKTOTAL, CKMB, CKMBINDEX, TROPONINI in the last 168 hours. Sepsis Labs: Recent Labs  Lab 2021-02-03 1146 02/03/21 1345 Feb 03, 2021 2010 02/04/2021 0537  WBC 0.8*  --  0.5* 1.3*  LATICACIDVEN >11.0* >11.0* >11.0*  --     Procedures/Operations  As per EMR   Lesia Sago Erez Mccallum 01/22/2021, 6:22 PM

## 2021-02-07 NOTE — TOC Initial Note (Signed)
Transition of Care [facility]) - Initial/Assessment Note    Patient Details  Name: Cameron Coffey MRN: [id number] Date of Birth: 14-Apr-1961  [facility]) CM/SW Contact:    Golda Acre, RN Phone Number: 02/13/21, 8:58 AM  Clinical Narrative:                 1 Day Post-Op  02/05/2021  Preoperative diagnosis: Peritonitis with possible mesenteric ischemia  Postoperative diagnosis: Mesenteric ischemia with necrotic right colon and perforation with overwhelming sepsis  Procedure:  Exploratory laparotomy Partial "right" colectomy Placement of vacuum pack dressing   Surgeon: Harriette Bouillon, MD  Assessment  CRITICAL IN MULTISYSTEM ORGAN FAILURE  PROGNOSIS [facility] Stay = 1 days)  Plan:  -Vent, pressors, dialysis per critical care service  If patient survives, will need second-look operation for ostomy formation and fascial closure.  High risk for need for further bowel resection.  Removal of packing.  Usually wait 48 hours from initial event in the hopes that the patient can be medically stabilized.  Unfortunately, patient is on maximal pressors and still hypotensive and struggling.  Prognosis grim. PLan: patient is to unstable at this time to formulate plan, will follow for toc needs.  Expected Discharge Plan: Home/Self Care Barriers to Discharge: Continued Medical Work up   Patient Goals and CMS Choice Patient states their goals for this hospitalization and ongoing recovery are:: on vent      Expected Discharge Plan and Services Expected Discharge Plan: Home/Self Care   Discharge Planning Services: CM Consult   Living arrangements for the past 2 months: Single Family Home                                      Prior Living Arrangements/Services Living arrangements for the past 2 months: Single Family Home Lives with:: Self Patient language and need for interpreter reviewed:: Yes        Need for Family Participation in  Patient Care: Yes (Comment) Care giver support system in place?: Yes (comment)   Criminal Activity/Legal Involvement Pertinent to Current Situation/Hospitalization: No - Comment as needed  Activities of Daily Living      Permission Sought/Granted                  Emotional Assessment Appearance:: Appears stated age Attitude/Demeanor/Rapport: Unable to Assess   Orientation: : Fluctuating Orientation (Suspected and/or reported Sundowners)   Psych Involvement: No (comment)  Admission diagnosis:  Lactic acidosis [E87.2] Shock (HCC) [R57.9] Mesenteric ischemia (HCC) [K55.9] Patient Active Problem List   Diagnosis Date Noted  . Thrombocytopenia (HCC) 01/30/2021  . Leukopenia 01/24/2021  . Lactic acidosis 01/17/2021  . AKI (acute kidney injury) (HCC) 01/22/2021  . Mesenteric ischemia (HCC) 01/28/2021  . Upper GI bleed 12/21/2018  . Cirrhosis of liver 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)   . Chronic hepatitis C without hepatic coma (HCC)   . Bleeding esophageal varices (HCC)   . Depression with anxiety 09/15/2017  . Dyslipidemia associated with type 2 diabetes mellitus (HCC) 07/30/2017  . Type II diabetes with long term use of insulin (HCC) 07/30/2017  . Essential hypertension 05/28/2015   PCP:  Franciso Bend, NP Pharmacy:   [facility] [place], [place] - [id number], [address] [place] [address] Phone: [phone] Fax: [phone]  [facility] [address] [place] - [address]  [place] [address] Phone: [phone] Fax: [phone]     Social Determinants of Health (SDOH) Interventions    Readmission Risk Interventions No flowsheet data found.

## 2021-02-07 NOTE — Progress Notes (Signed)
RN reported at 1424 to RT that PT deceased while on vent. ETT remains in PT (vent removed)

## 2021-02-07 NOTE — Progress Notes (Signed)
PHARMACY - PHYSICIAN COMMUNICATION CRITICAL VALUE ALERT - BLOOD CULTURE IDENTIFICATION (BCID)  Cameron Coffey is an 60 y.o. male who presented to [facility] on 02/01/2021 with a chief complaint of mesenteric ischemia with necrotic right colon and perforation s/p ex-lap with partial colectomy  Assessment: GNR 4/4 EColi, no Resistance  Name of physician (or Provider) Contacted: Stretch  Current antibiotics: zosyn  Changes to prescribed antibiotics recommended:  none  Results for orders placed or performed during the hospital encounter of 02/01/2021  Blood Culture ID Panel (Reflexed) (Collected: 02/06/2021  1:50 PM)  Result Value Ref Range   Enterococcus faecalis NOT DETECTED NOT DETECTED   Enterococcus Faecium NOT DETECTED NOT DETECTED   Listeria monocytogenes NOT DETECTED NOT DETECTED   Staphylococcus species NOT DETECTED NOT DETECTED   Staphylococcus aureus (BCID) NOT DETECTED NOT DETECTED   Staphylococcus epidermidis NOT DETECTED NOT DETECTED   Staphylococcus lugdunensis NOT DETECTED NOT DETECTED   Streptococcus species NOT DETECTED NOT DETECTED   Streptococcus agalactiae NOT DETECTED NOT DETECTED   Streptococcus pneumoniae NOT DETECTED NOT DETECTED   Streptococcus pyogenes NOT DETECTED NOT DETECTED   A.calcoaceticus-baumannii NOT DETECTED NOT DETECTED   Bacteroides fragilis NOT DETECTED NOT DETECTED   Enterobacterales DETECTED (A) NOT DETECTED   Enterobacter cloacae complex NOT DETECTED NOT DETECTED   Escherichia coli DETECTED (A) NOT DETECTED   Klebsiella aerogenes NOT DETECTED NOT DETECTED   Klebsiella oxytoca NOT DETECTED NOT DETECTED   Klebsiella pneumoniae NOT DETECTED NOT DETECTED   Proteus species NOT DETECTED NOT DETECTED   Salmonella species NOT DETECTED NOT DETECTED   Serratia marcescens NOT DETECTED NOT DETECTED   Haemophilus influenzae NOT DETECTED NOT DETECTED   Neisseria meningitidis NOT DETECTED NOT DETECTED   Pseudomonas aeruginosa NOT DETECTED NOT DETECTED    Stenotrophomonas maltophilia NOT DETECTED NOT DETECTED   Candida albicans NOT DETECTED NOT DETECTED   Candida auris NOT DETECTED NOT DETECTED   Candida glabrata NOT DETECTED NOT DETECTED   Candida krusei NOT DETECTED NOT DETECTED   Candida parapsilosis NOT DETECTED NOT DETECTED   Candida tropicalis NOT DETECTED NOT DETECTED   Cryptococcus neoformans/gattii NOT DETECTED NOT DETECTED   CTX-M ESBL NOT DETECTED NOT DETECTED   Carbapenem resistance IMP NOT DETECTED NOT DETECTED   Carbapenem resistance KPC NOT DETECTED NOT DETECTED   Carbapenem resistance NDM NOT DETECTED NOT DETECTED   Carbapenem resist OXA 48 LIKE NOT DETECTED NOT DETECTED   Carbapenem resistance VIM NOT DETECTED NOT DETECTED    Arley Phenix RPh 02-09-21, 3:29 AM

## 2021-02-07 NOTE — Procedures (Signed)
Central Venous Catheter Insertion Procedure Note  AVYON HERENDEEN  [id number]  Jun 27, 1961  Date:02/06/2021  Time:12:35 AM   Provider Performing:Seong-Joo Ardeth Perfect   Procedure: Insertion of Non-tunneled Central Venous Catheter(36556) without US guidance, change out over guidewire  Indication(s) Hemodialysis; malfunctioning HD catheter  Consent Unable to obtain consent due to emergent nature of procedure.  Anesthesia None  Timeout Verified patient identification, verified procedure, site/side was marked, verified correct patient position, special equipment/implants available, medications/allergies/relevant history reviewed, required imaging and test results available.  Sterile Technique Maximal sterile technique including full sterile barrier drape, hand hygiene, sterile gown, sterile gloves, mask, hair covering, sterile ultrasound probe cover (if used).  Procedure Description Area of catheter insertion was cleaned with chlorhexidine and draped in sterile fashion.  Without real-time ultrasound guidance a 20 cm Trialysis HD catheter was placed into the right internal jugular vein. Nonpulsatile blood flow and easy flushing noted in all ports.  The catheter was sutured in place and sterile dressing applied.  Complications/Tolerance None; patient tolerated the procedure well. Chest X-ray is ordered to verify placement.  EBL Minimal  Specimen(s) None  Marcelle Smiling, MD Board Certified by the [employer], Pulmonary Diseases & Critical Care Medicine

## 2021-02-07 NOTE — Progress Notes (Signed)
100 mL of IV morphine wasted after patient passed. Witnessed by Rolanda Jay, rn

## 2021-02-07 DEATH — deceased
